# Patient Record
Sex: Male | Born: 1955 | Race: White | Hispanic: No | Marital: Married | Smoking: Former smoker
Health system: Southern US, Community
[De-identification: ages and names within clinical notes are randomized; demographics above are authoritative.]

## PROBLEM LIST (undated history)

## (undated) DIAGNOSIS — Z923 Personal history of irradiation: Secondary | ICD-10-CM

## (undated) DIAGNOSIS — T7840XA Allergy, unspecified, initial encounter: Secondary | ICD-10-CM

## (undated) DIAGNOSIS — E669 Obesity, unspecified: Secondary | ICD-10-CM

## (undated) DIAGNOSIS — C159 Malignant neoplasm of esophagus, unspecified: Secondary | ICD-10-CM

## (undated) DIAGNOSIS — I10 Essential (primary) hypertension: Secondary | ICD-10-CM

## (undated) DIAGNOSIS — K219 Gastro-esophageal reflux disease without esophagitis: Secondary | ICD-10-CM

## (undated) DIAGNOSIS — E119 Type 2 diabetes mellitus without complications: Secondary | ICD-10-CM

## (undated) DIAGNOSIS — E559 Vitamin D deficiency, unspecified: Secondary | ICD-10-CM

## (undated) DIAGNOSIS — E291 Testicular hypofunction: Secondary | ICD-10-CM

## (undated) DIAGNOSIS — E785 Hyperlipidemia, unspecified: Secondary | ICD-10-CM

## (undated) DIAGNOSIS — J90 Pleural effusion, not elsewhere classified: Secondary | ICD-10-CM

## (undated) HISTORY — DX: Vitamin D deficiency, unspecified: E55.9

## (undated) HISTORY — DX: Type 2 diabetes mellitus without complications: E11.9

## (undated) HISTORY — DX: Obesity, unspecified: E66.9

## (undated) HISTORY — DX: Essential (primary) hypertension: I10

## (undated) HISTORY — PX: WISDOM TOOTH EXTRACTION: SHX21

## (undated) HISTORY — DX: Allergy, unspecified, initial encounter: T78.40XA

## (undated) HISTORY — PX: COLONOSCOPY: SHX174

## (undated) HISTORY — DX: Testicular hypofunction: E29.1

## (undated) HISTORY — DX: Hyperlipidemia, unspecified: E78.5

---

## 2007-09-15 ENCOUNTER — Ambulatory Visit: Payer: Self-pay | Admitting: Gastroenterology

## 2007-09-26 ENCOUNTER — Ambulatory Visit: Payer: Self-pay | Admitting: Gastroenterology

## 2007-09-26 ENCOUNTER — Encounter: Payer: Self-pay | Admitting: Gastroenterology

## 2008-08-08 ENCOUNTER — Ambulatory Visit (HOSPITAL_COMMUNITY): Admission: RE | Admit: 2008-08-08 | Discharge: 2008-08-08 | Payer: Self-pay | Admitting: Internal Medicine

## 2012-07-27 ENCOUNTER — Encounter: Payer: Self-pay | Admitting: Gastroenterology

## 2013-05-04 ENCOUNTER — Encounter: Payer: Self-pay | Admitting: Gastroenterology

## 2013-07-31 ENCOUNTER — Telehealth: Payer: Self-pay | Admitting: Internal Medicine

## 2013-07-31 MED ORDER — METFORMIN HCL ER 500 MG PO TB24: ORAL_TABLET | ORAL | Status: DC

## 2013-07-31 NOTE — Telephone Encounter (Signed)
PT HAS RUN OUT OF METFORMIN, SCHD APPT FOR 08-11-13 WITH YOU. PHARM: [facility],[place].

## 2013-08-10 ENCOUNTER — Encounter: Payer: Self-pay | Admitting: Physician Assistant

## 2013-08-10 DIAGNOSIS — E559 Vitamin D deficiency, unspecified: Secondary | ICD-10-CM

## 2013-08-10 DIAGNOSIS — E119 Type 2 diabetes mellitus without complications: Secondary | ICD-10-CM | POA: Insufficient documentation

## 2013-08-10 DIAGNOSIS — E669 Obesity, unspecified: Secondary | ICD-10-CM

## 2013-08-10 DIAGNOSIS — E785 Hyperlipidemia, unspecified: Secondary | ICD-10-CM

## 2013-08-10 DIAGNOSIS — I10 Essential (primary) hypertension: Secondary | ICD-10-CM | POA: Insufficient documentation

## 2013-08-10 DIAGNOSIS — E291 Testicular hypofunction: Secondary | ICD-10-CM | POA: Insufficient documentation

## 2013-08-11 ENCOUNTER — Encounter: Payer: Self-pay | Admitting: Physician Assistant

## 2013-08-11 ENCOUNTER — Ambulatory Visit (INDEPENDENT_AMBULATORY_CARE_PROVIDER_SITE_OTHER): Payer: BC Managed Care – PPO | Admitting: Physician Assistant

## 2013-08-11 VITALS — BP 136/82 | HR 88 | Temp 98.2°F | Resp 16 | Ht 70.0 in | Wt 310.0 lb

## 2013-08-11 DIAGNOSIS — E119 Type 2 diabetes mellitus without complications: Secondary | ICD-10-CM

## 2013-08-11 DIAGNOSIS — E785 Hyperlipidemia, unspecified: Secondary | ICD-10-CM

## 2013-08-11 DIAGNOSIS — E559 Vitamin D deficiency, unspecified: Secondary | ICD-10-CM

## 2013-08-11 DIAGNOSIS — I10 Essential (primary) hypertension: Secondary | ICD-10-CM

## 2013-08-11 MED ORDER — DAPAGLIFLOZIN PROPANEDIOL 10 MG PO TABS: ORAL_TABLET | ORAL | Status: DC

## 2013-08-11 NOTE — Progress Notes (Signed)
HPI Patient presents for 3 month follow up with hypertension, hyperlipidemia, prediabetes and vitamin D. Patient's blood pressure has been controlled at home. Patient denies chest pain, shortness of breath, dizziness.  Patient's cholesterol is diet controlled. The cholesterol last visit was. The patient has been working on diet and exercise for prediabetes, and denies changes in vision, polys, and paresthesias.   Patient is on Phentermine and has been on it for 1.5 years. His weight is up 10 lbs from last visit in Jan. Patient is on Vitamin D supplement.  Current Medications:  Current Outpatient Prescriptions on File Prior to Visit  Medication Sig Dispense Refill  . aspirin 81 MG tablet Take 81 mg by mouth daily.      . cholecalciferol (VITAMIN D) 1000 UNITS tablet Take 10,000 Units by mouth daily.      Marland Kitchen losartan (COZAAR) 100 MG tablet Take 100 mg by mouth daily.      . metFORMIN (GLUCOPHAGE XR) 500 MG 24 hr tablet 2 pills twice daily with food  120 tablet  0  . phentermine (ADIPEX-P) 37.5 MG tablet Take 37.5 mg by mouth daily before breakfast.      . testosterone (ANDROGEL) 50 MG/5GM GEL Place 5 g onto the skin daily.       No current facility-administered medications on file prior to visit.   Medical History:  Past Medical History  Diagnosis Date  . Hypertension   . Hyperlipidemia   . Diabetes mellitus without complication   . Obesity   . Hypogonadism male   . Vitamin D deficiency    Allergies:  Allergies  Allergen Reactions  . Ace Inhibitors     cough    ROS Constitutional: Denies fever, chills, weight loss/gain, headaches, insomnia, fatigue, night sweats, and change in appetite. Eyes: Denies redness, blurred vision, diplopia, discharge, itchy, watery eyes.  ENT: Denies discharge, congestion, post nasal drip, sore throat, earache, dental pain, Tinnitus, Vertigo, Sinus pain, snoring.  Cardio: Denies chest pain, palpitations, irregular heartbeat,  dyspnea, diaphoresis,  orthopnea, PND, claudication, edema Respiratory: denies cough, dyspnea,pleurisy, hoarseness, wheezing.  Gastrointestinal: Denies dysphagia, heartburn,  water brash, pain, cramps, nausea, vomiting, bloating, diarrhea, constipation, hematemesis, melena, hematochezia,  hemorrhoids Genitourinary: Denies dysuria, frequency, urgency, nocturia, hesitancy, discharge, hematuria, flank pain Musculoskeletal: Denies arthralgia, myalgia, stiffness, Jt. Swelling, pain, limp, and strain/sprain. Skin: Denies pruritis, rash, hives, warts, acne, eczema, changing in skin lesion Neuro: Denies Weakness, tremor, incoordination, spasms, paresthesia, pain Psychiatric: Denies confusion, memory loss, sensory loss Endocrine: Denies change in weight, skin, hair change, nocturia, and paresthesia, Diabetic Polys, Denies visual blurring, hyper /hypo glycemic episodes.  Heme/Lymph: Denies Excessive bleeding, bruising, enlarged lymph nodes  Family history- Review and unchanged Social history- Review and unchanged Physical Exam: Filed Vitals:   08/11/13 0916  BP: 136/82  Pulse: 88  Temp: 98.2 F (36.8 C)  Resp: 16   Filed Weights   08/11/13 0916  Weight: 310 lb (140.615 kg)   General Appearance: Well nourished, in no apparent distress. Eyes: PERRLA, EOMs, conjunctiva no swelling or erythema, normal fundi and vessels. Sinuses: No Frontal/maxillary tenderness ENT/Mouth: Ext aud canals clear, with TMs without erythema, bulging.No erythema, swelling, or exudate on post pharynx.  Tonsils not swollen or erythematous. Hearing normal.  Neck: Supple, thyroid normal.  Respiratory: Respiratory effort normal, BS equal bilaterally without rales, rhonchi, wheezing or stridor.  Cardio: Heart sounds normal, regular rate and rhythm without murmurs, rubs or gallops. Peripheral pulses brisk and equal bilaterally, without edema.  Abdomen: Obese soft, with bowel  sounds. Non tender, no guarding, rebound, hernias, masses, or organomegaly.   Lymphatics: Non tender without lymphadenopathy.  Musculoskeletal: Full ROM all peripheral extremities, joint stability, 5/5 strength, and normal gait. Skin: Warm, dry without rashes, lesions, ecchymosis.  Neuro: Cranial nerves intact, reflexes equal bilaterally. Normal muscle tone, no cerebellar symptoms. Sensation intact.  Psych: Awake and oriented X 3, normal affect, Insight and Judgment appropriate.   Assessment and Plan:  Hypertension: Continue medication, monitor blood pressure at home. Continue DASH diet. Cholesterol: Continue diet and exercise.  Diabetes-Continue diet and exercise. We will try to add on Farxiga for further weight loss. We will get a BMP in one month at his work.  Vitamin D Def-and continue medications.   Joel Miller 9:39 AM

## 2013-08-11 NOTE — Patient Instructions (Signed)
   Bad carbs also include fruit juice, alcohol, and sweet tea. These are empty calories that do not signal to your brain that you are full.   Please remember the good carbs are still carbs which convert into sugar. So please measure them out no more than 1/2-1 cup of rice, oatmeal, pasta, and beans.  Veggies are however free foods! Pile them on.   I like lean protein at every meal such as chicken, [place], pork chops, cottage cheese, etc. Just do not fry these meats and please center your meal around vegetable, the meats should be a side dish.   No all fruit is created equal. Please see the list below, the fruit at the bottom is higher in sugars than the fruit at the top   We want weight loss that will last so you should lose 1-2 pounds a week.  THAT IS IT! Please pick THREE things a month to change. Once it is a habit check off the item. Then pick another three items off the list to become habits.  If you are already doing a habit on the list GREAT!  Cross that item off! o Don't drink your calories. Ie, alcohol, soda, fruit juice, and sweet tea.  o Drink more water. Drink a glass when you feel hungry or before each meal.  o Eat breakfast - Complex carb and protein (likeDannon light and fit yogurt, oatmeal, fruit, eggs, [place] bacon). o Measure your cereal.  Eat no more than one cup a day. (ie [place]) o Eat an apple a day. o Add a vegetable a day. o Try a new vegetable a month. o Use Pam! Stop using oil or butter to cook. o Don't finish your plate or use smaller plates. o Share your dessert. o Eat sugar free Jello for dessert or frozen grapes. o Don't eat 2-3 hours before bed. o Switch to whole wheat bread, pasta, and brown rice. o Make healthier choices when you eat out. No fries! o Pick baked chicken, NOT fried. o Don't forget to SLOW DOWN when you eat. It is not going anywhere.  o Take the stairs. o Park far away in the parking lot o [employer] (or weights) for 10 minutes while  watching TV. o Walk at work for 10 minutes during break. o Walk outside 1 time a week with your friend, kids, dog, or significant other. o Start a walking group at church. o Walk the mall as much as you can tolerate.  o Keep a food diary. o Weigh yourself daily. o Walk for 15 minutes 3 days per week. o Cook at home more often and eat out less.  If life happens and you go back to old habits, it is okay.  Just start over. You can do it!   If you experience chest pain, get short of breath, or tired during the exercise, please stop immediately and inform your doctor.  Remember exercise is great for your cardiovascular health and can help with weight loss but YOU CAN NOT OUT RUN YOUR FORK!

## 2013-08-28 ENCOUNTER — Other Ambulatory Visit: Payer: Self-pay | Admitting: Physician Assistant

## 2013-08-28 MED ORDER — PHENTERMINE HCL 37.5 MG PO TABS: 37.5000 mg | ORAL_TABLET | Freq: Every day | ORAL | Status: DC

## 2013-09-08 ENCOUNTER — Other Ambulatory Visit: Payer: Self-pay | Admitting: Physician Assistant

## 2013-09-08 MED ORDER — METFORMIN HCL ER 500 MG PO TB24: ORAL_TABLET | ORAL | Status: DC

## 2013-10-06 NOTE — Progress Notes (Signed)
Patient ID: Joel Miller, male   DOB: [birth year], 58 y.o.   MRN: [id number] Annual Screening Comprehensive Examination  This very nice 58 y.o.  MWM presents for complete physical.  Patient has been followed for HTN, Diabetes  Hyperlipidemia, and Vitamin D Deficiency.   HTN predates since  2010. Patient's BP has been controlled at home.Today's BP: 133/84 mmHg. Patient denies any cardiac symptoms as chest pain, palpitations, shortness of breath, dizziness or ankle swelling.   Patient's hyperlipidemia is controlled with diet and medications. Patient denies myalgias or other medication SE's. Last cholesterol last visit was 174, triglycerides 165, HDL 42 and LDL 99.     Patient has Diabetes with last A1c  6.8% in July 2014. Patient denies reactive hypoglycemic symptoms, visual blurring, diabetic polys, or paresthesias. He has been on phentermine with weight loss and he has lost 40 # over the last 3 years. Last visit he was put on Farxiga along with his Metformin.    Finally, patient has history of Vitamin D Deficiency of 63 in 2010 with last vitamin D 47 in July 2014. He also has Testosterone Deficiency with level of 118 in 2008 and has deferred treatment.    Medication List       aspirin 81 MG tablet  Take 81 mg by mouth daily.     Dapagliflozin Propanediol 10 MG Tabs  Commonly known as:  FARXIGA  One pill daily for diabetes     losartan 100 MG tablet  Commonly known as:  COZAAR  Take 100 mg by mouth daily.     metFORMIN 500 MG 24 hr tablet  Commonly known as:  GLUCOPHAGE XR  2 pills twice daily with food     phentermine 37.5 MG tablet  Commonly known as:  ADIPEX-P  1/2 to 1 tablet twice daily for weight loss     testosterone 50 MG/5GM Gel  Commonly known as:  ANDROGEL  Place 5 g onto the skin daily. Apply 2 pumps daily  Dispense 1pump per month     VITAMIN D PO  Take 10,000 Units by mouth daily.        Allergies  Allergen Reactions  . Ace Inhibitors     cough    Past  Medical History  Diagnosis Date  . Hypertension   . Hyperlipidemia   . Diabetes mellitus without complication   . Obesity   . Hypogonadism male   . Vitamin D deficiency     No past surgical history on file.  Family History  Problem Relation Age of Onset  . Hypertension Mother   . Alzheimer's disease Mother   . Cancer Father 15    throat    History   Social History  . Marital Status: Married    Spouse Name: N/A    Number of Children: N/A  . Years of Education: N/A   Occupational History  . Not on file.   Social History Main Topics  . Smoking status: Never Smoker   . Smokeless tobacco: Never Used  . Alcohol Use: 1.0 oz/week    2 drink(s) per week     Comment: social  . Drug Use: No  . Sexual Activity: Yes      ROS Constitutional: Denies fever, chills, weight loss/gain, headaches, insomnia, fatigue, night sweats, and change in appetite. Eyes: Denies redness, blurred vision, diplopia, discharge, itchy, watery eyes.  ENT: Denies discharge, congestion, post nasal drip, epistaxis, sore throat, earache, hearing loss, dental pain, Tinnitus, Vertigo, Sinus pain, snoring.  Cardio:  Denies chest pain, palpitations, irregular heartbeat, syncope, dyspnea, diaphoresis, orthopnea, PND, claudication, edema Respiratory: denies cough, dyspnea, DOE, pleurisy, hoarseness, laryngitis, wheezing.  Gastrointestinal: Denies dysphagia, heartburn, reflux, water brash, pain, cramps, nausea, vomiting, bloating, diarrhea, constipation, hematemesis, melena, hematochezia, jaundice, hemorrhoids Genitourinary: Denies dysuria, frequency, urgency, nocturia, hesitancy, discharge, hematuria, flank pain Musculoskeletal: Denies arthralgia, myalgia, stiffness, Jt. Swelling, pain, limp, and strain/sprain. Skin: Denies puritis, rash, hives, warts, acne, eczema, changing in skin lesion Neuro: No weakness, tremor, incoordination, spasms, paresthesia, pain Psychiatric: Denies confusion, memory loss, sensory  loss Endocrine: Denies change in weight, skin, hair change, nocturia, and paresthesia, diabetic polys, visual blurring, hyper / hypo glycemic episodes.  Heme/Lymph: No excessive bleeding, bruising, or elarged lymph nodes.  BP: 133/84  Pulse: 64  Temp: 98.1 F (36.7 C)  Resp: 18    Estimated body mass index is 43.62 kg/(m^2) as calculated from the following:   Height as of this encounter: 5\' 10"  (1.778 m).   Weight as of this encounter: 304 lb (137.893 kg).  Physical Exam  General Appearance: Well nourished, in no apparent distress. Eyes: PERRLA, EOMs, conjunctiva no swelling or erythema, normal fundi and vessels. Sinuses: No frontal/maxillary tenderness ENT/Mouth: EACs patent / TMs  nl. Nares clear without erythema, swelling, mucoid exudates. Oral hygiene is good. No erythema, swelling, or exudate. Tongue normal, non-obstructing. Tonsils not swollen or erythematous. Hearing normal.  Neck: Supple, thyroid normal. No bruits, nodes or JVD. Respiratory: Respiratory effort normal.  BS equal and clear bilateral without rales, rhonci, wheezing or stridor. Cardio: Heart sounds are normal with regular rate and rhythm and no murmurs, rubs or gallops. Peripheral pulses are normal and equal bilaterally without edema. No aortic or femoral bruits. Chest: symmetric with normal excursions and percussion.  Abdomen: Flat, soft, with bowl sounds. Nontender, no guarding, rebound, hernias, masses, or organomegaly.  Lymphatics: Non tender without lymphadenopathy.  Genitourinary: No hernias.Testes nl. DRE - prostate nl for age - smooth & firm w/o nodules. Musculoskeletal: Full ROM all peripheral extremities, joint stability, 5/5 strength, and normal gait. Skin: Warm and dry without rashes, lesions, cyanosis, clubbing or  ecchymosis.  Neuro: Cranial nerves intact, reflexes equal bilaterally. Normal muscle tone, no cerebellar symptoms. Sensation intact.  Pysch: Awake and oriented X 3, normal affect, insight  and judgment appropriate.   Assessment and Plan  1. Annual Screening Examination 2. Hypertension  3. Hyperlipidemia 4. T2 NIDDM 5. Vitamin D Deficiency 6. Morbid Obesity (BMI 44)  Continue prudent diet as discussed, weight control, BP monitoring, regular exercise, and medications as discussed.  Discussed med effects and SE's. Routine screening labs and tests as requested with regular follow-up as recommended. Discussed increasing his Phentermine 37.5 mg qam and 1/2 tab also at lunch time to carry him thru the evening meal whence he usually fails.

## 2013-10-06 NOTE — Patient Instructions (Signed)

## 2013-10-09 ENCOUNTER — Encounter: Payer: Self-pay | Admitting: Internal Medicine

## 2013-10-09 ENCOUNTER — Ambulatory Visit (INDEPENDENT_AMBULATORY_CARE_PROVIDER_SITE_OTHER): Payer: BC Managed Care – PPO | Admitting: Internal Medicine

## 2013-10-09 ENCOUNTER — Other Ambulatory Visit: Payer: Self-pay | Admitting: Emergency Medicine

## 2013-10-09 VITALS — BP 133/84 | HR 64 | Temp 98.1°F | Resp 18 | Ht 70.0 in | Wt 304.0 lb

## 2013-10-09 DIAGNOSIS — Z111 Encounter for screening for respiratory tuberculosis: Secondary | ICD-10-CM

## 2013-10-09 DIAGNOSIS — C61 Malignant neoplasm of prostate: Secondary | ICD-10-CM

## 2013-10-09 DIAGNOSIS — Z1159 Encounter for screening for other viral diseases: Secondary | ICD-10-CM

## 2013-10-09 DIAGNOSIS — E119 Type 2 diabetes mellitus without complications: Secondary | ICD-10-CM

## 2013-10-09 DIAGNOSIS — Z0000 Encounter for general adult medical examination without abnormal findings: Secondary | ICD-10-CM

## 2013-10-09 DIAGNOSIS — I10 Essential (primary) hypertension: Secondary | ICD-10-CM

## 2013-10-09 DIAGNOSIS — Z1211 Encounter for screening for malignant neoplasm of colon: Secondary | ICD-10-CM

## 2013-10-09 DIAGNOSIS — E291 Testicular hypofunction: Secondary | ICD-10-CM

## 2013-10-09 DIAGNOSIS — Z125 Encounter for screening for malignant neoplasm of prostate: Secondary | ICD-10-CM

## 2013-10-09 MED ORDER — TESTOSTERONE 50 MG/5GM (1%) TD GEL: 5.0000 g | Freq: Every day | TRANSDERMAL | Status: DC

## 2013-10-09 MED ORDER — DAPAGLIFLOZIN PROPANEDIOL 10 MG PO TABS
ORAL_TABLET | ORAL | Status: DC
Start: 2013-10-09 — End: 2014-04-23

## 2013-10-09 MED ORDER — PHENTERMINE HCL 37.5 MG PO TABS: ORAL_TABLET | ORAL | Status: DC

## 2013-10-10 LAB — URINALYSIS, MICROSCOPIC ONLY
BACTERIA UA: NONE SEEN
Casts: NONE SEEN
Crystals: NONE SEEN
Squamous Epithelial / LPF: NONE SEEN

## 2013-10-10 LAB — MICROALBUMIN / CREATININE URINE RATIO
CREATININE, URINE: 58 mg/dL
MICROALB UR: 0.5 mg/dL (ref 0.00–1.89)
Microalb Creat Ratio: 8.6 mg/g (ref 0.0–30.0)

## 2013-10-11 LAB — URINE CULTURE
Colony Count: NO GROWTH
ORGANISM ID, BACTERIA: NO GROWTH

## 2013-10-12 LAB — TB SKIN TEST
Induration: 0 mm
TB Skin Test: NEGATIVE

## 2013-10-16 ENCOUNTER — Telehealth: Payer: Self-pay

## 2013-10-16 ENCOUNTER — Other Ambulatory Visit: Payer: Self-pay | Admitting: Physician Assistant

## 2013-10-16 NOTE — Telephone Encounter (Signed)
Jeani Hawking from [facility] called and said that Androgel was called in for 2 pumps daily (2.5gm) but directions say use 5gm QD. Per McK, Androgel should be called in for 4 pumps QD. (5gm) spoke w/ Jeani Hawking to change directions.

## 2013-10-19 ENCOUNTER — Other Ambulatory Visit (INDEPENDENT_AMBULATORY_CARE_PROVIDER_SITE_OTHER): Payer: BC Managed Care – PPO | Admitting: *Deleted

## 2013-10-19 DIAGNOSIS — Z1211 Encounter for screening for malignant neoplasm of colon: Secondary | ICD-10-CM

## 2013-10-19 LAB — POC HEMOCCULT BLD/STL (HOME/3-CARD/SCREEN)
FECAL OCCULT BLD: NEGATIVE
FECAL OCCULT BLD: NEGATIVE
FECAL OCCULT BLD: NEGATIVE

## 2013-12-14 ENCOUNTER — Other Ambulatory Visit: Payer: Self-pay

## 2014-01-08 ENCOUNTER — Ambulatory Visit: Payer: BC Managed Care – PPO | Admitting: Internal Medicine

## 2014-01-15 ENCOUNTER — Ambulatory Visit (INDEPENDENT_AMBULATORY_CARE_PROVIDER_SITE_OTHER): Payer: BC Managed Care – PPO | Admitting: Internal Medicine

## 2014-01-15 ENCOUNTER — Encounter: Payer: Self-pay | Admitting: Internal Medicine

## 2014-01-15 VITALS — BP 130/82 | HR 80 | Temp 97.7°F | Resp 16 | Wt 290.2 lb

## 2014-01-15 DIAGNOSIS — E119 Type 2 diabetes mellitus without complications: Secondary | ICD-10-CM

## 2014-01-15 DIAGNOSIS — Z1211 Encounter for screening for malignant neoplasm of colon: Secondary | ICD-10-CM

## 2014-01-15 DIAGNOSIS — E785 Hyperlipidemia, unspecified: Secondary | ICD-10-CM

## 2014-01-15 DIAGNOSIS — Z79899 Other long term (current) drug therapy: Secondary | ICD-10-CM | POA: Insufficient documentation

## 2014-01-15 DIAGNOSIS — I10 Essential (primary) hypertension: Secondary | ICD-10-CM

## 2014-01-15 DIAGNOSIS — E559 Vitamin D deficiency, unspecified: Secondary | ICD-10-CM

## 2014-01-15 NOTE — Progress Notes (Signed)
Patient ID: Joel Miller, male   DOB: 1956-01-08, 58 y.o.   MRN: [id number]    This very nice 58 y.o. MWM presents for 3 month follow up with Hypertension, Hyperlipidemia, Pre-Diabetes and Vitamin D Deficiency.    HTN predates since 2010. BP has been controlled at home. Today's BP: 130/82 mmHg. Patient denies any cardiac type chest pain, palpitations, dyspnea/orthopnea/PND, dizziness, claudication, or dependent edema.   Hyperlipidemia is controlled with diet & meds. Recent labs done at work showed  Cholesterol was 160, Triglycerides were 154, HDL 39 and LDL 90. Patient denies myalgias or other med SE's.      Also, the patient has history of T2 NIDDM since 2009 with recent A1c of  6.1%.  Patient has lost 14+ lbs. over the last 3 months with assistance of Phentermine. Patient denies any symptoms of reactive hypoglycemia, diabetic polys, paresthesias or visual blurring.   The patient is on testosterone replacement therapy. Further, Patient has history of Vitamin D Deficiency with last vitamin D of 55. Patient supplements vitamin D without any suspected side-effects.    Medication List       aspirin 81 MG tablet  Take 81 mg by mouth daily.     Dapagliflozin Propanediol 10 MG Tabs  Commonly known as:  FARXIGA  One pill daily for diabetes     losartan 100 MG tablet  Commonly known as:  COZAAR  Take 100 mg by mouth daily.     metFORMIN 500 MG 24 hr tablet  Commonly known as:  GLUCOPHAGE XR  2 pills twice daily with food     phentermine 37.5 MG tablet  Commonly known as:  ADIPEX-P  1/2 to 1 tablet twice daily for weight loss     testosterone 50 MG/5GM (1%) Gel  Commonly known as:  ANDROGEL  Place 5 g onto the skin daily. Apply 2 pumps daily  Dispense 1pump per month     VITAMIN D PO  Take 10,000 Units by mouth daily.     zolpidem 12.5 MG CR tablet  Commonly known as:  AMBIEN CR        Allergies  Allergen Reactions  . Ace Inhibitors     cough   PMHx:   Past Medical  History  Diagnosis Date  . Hypertension   . Hyperlipidemia   . Diabetes mellitus without complication   . Obesity   . Hypogonadism male   . Vitamin D deficiency    FHx:    Reviewed / unchanged  SHx:    Reviewed / unchanged   Systems Review: Constitutional: Denies fever, chills, wt changes, headaches, insomnia, fatigue, night sweats, change in appetite. Eyes: Denies redness, blurred vision, diplopia, discharge, itchy, watery eyes.  ENT: Denies discharge, congestion, post nasal drip, epistaxis, sore throat, earache, hearing loss, dental pain, tinnitus, vertigo, sinus pain, snoring.  CV: Denies chest pain, palpitations, irregular heartbeat, syncope, dyspnea, diaphoresis, orthopnea, PND, claudication or edema. Respiratory: denies cough, dyspnea, DOE, pleurisy, hoarseness, laryngitis, wheezing.  Gastrointestinal: Denies dysphagia, odynophagia, heartburn, reflux, water brash, abdominal pain or cramps, nausea, vomiting, bloating, diarrhea, constipation, hematemesis, melena, hematochezia  or hemorrhoids. Genitourinary: Denies dysuria, frequency, urgency, nocturia, hesitancy, discharge, hematuria or flank pain. Musculoskeletal: Denies arthralgias, myalgias, stiffness, jt. swelling, pain, limping or strain/sprain.  Skin: Denies pruritus, rash, hives, warts, acne, eczema or change in skin lesion(s). Neuro: No weakness, tremor, incoordination, spasms, paresthesia or pain. Psychiatric: Denies confusion, memory loss or sensory loss. Endo: Denies change in weight, skin or hair change.  Heme/Lymph: No  excessive bleeding, bruising or enlarged lymph nodes.   Exam:  BP 130/82  Pulse 80  Temp(Src) 97.7 F (36.5 C)  Resp 16  Wt 290 lb 3.2 oz (131.634 kg)  Appears over nourished - ,morbidly obese and in no distress. Eyes: PERRLA, EOMs, conjunctiva no swelling or erythema. Sinuses: No frontal/maxillary tenderness ENT/Mouth: EAC's clear, TM's nl w/o erythema, bulging. Nares clear w/o erythema,  swelling, exudates. Oropharynx clear without erythema or exudates. Oral hygiene is good. Tongue normal, non obstructing. Hearing intact.  Neck: Supple. Thyroid nl. Car 2+/2+ without bruits, nodes or JVD. Chest: Respirations nl with BS clear & equal w/o rales, rhonchi, wheezing or stridor.  Cor: Heart sounds normal w/ regular rate and rhythm without sig. murmurs, gallops, clicks, or rubs. Peripheral pulses normal and equal  without edema.  Abdomen: Soft & bowel sounds normal. Non-tender w/o guarding, rebound, hernias, masses, or organomegaly.  Lymphatics: Unremarkable.  Musculoskeletal: Full ROM all peripheral extremities, joint stability, 5/5 strength, and normal gait.  Skin: Warm, dry without exposed rashes, lesions or ecchymosis apparent.  Neuro: Cranial nerves intact, reflexes equal bilaterally. Sensory-motor testing grossly intact. Tendon reflexes grossly intact.  Pysch: Alert & oriented x 3. Insight and judgement nl & appropriate. No ideations.  Assessment and Plan:  1. Hypertension - Continue monitor blood pressure at home. Continue diet/meds same.  2. Hyperlipidemia - Continue diet/meds, exercise,& lifestyle modifications. Continue monitor periodic cholesterol/liver & renal functions   3. T2 NIDDM - continue recommend prudent low glycemic diet, weight control, regular exercise, diabetic monitoring and periodic eye exams.  4. Vitamin D Deficiency - Continue supplementation.  5. Testosterone Deficiency - continue Androgel  Recommended regular exercise, BP monitoring, weight control, and discussed med and SE's. Recommended labs to assess and monitor clinical status. Further disposition pending results of labs.

## 2014-01-15 NOTE — Patient Instructions (Signed)

## 2014-02-28 ENCOUNTER — Other Ambulatory Visit: Payer: Self-pay

## 2014-02-28 MED ORDER — LOSARTAN POTASSIUM 100 MG PO TABS: 100.0000 mg | ORAL_TABLET | Freq: Every day | ORAL | Status: DC

## 2014-03-12 ENCOUNTER — Ambulatory Visit (INDEPENDENT_AMBULATORY_CARE_PROVIDER_SITE_OTHER): Payer: BC Managed Care – PPO | Admitting: Internal Medicine

## 2014-03-12 ENCOUNTER — Encounter: Payer: Self-pay | Admitting: Gastroenterology

## 2014-03-12 ENCOUNTER — Encounter: Payer: Self-pay | Admitting: Internal Medicine

## 2014-03-12 ENCOUNTER — Other Ambulatory Visit: Payer: Self-pay | Admitting: Internal Medicine

## 2014-03-12 VITALS — BP 126/76 | HR 68 | Temp 97.7°F | Resp 16 | Ht 70.0 in | Wt 286.8 lb

## 2014-03-12 DIAGNOSIS — K2100 Gastro-esophageal reflux disease with esophagitis, without bleeding: Secondary | ICD-10-CM

## 2014-03-12 DIAGNOSIS — R1310 Dysphagia, unspecified: Secondary | ICD-10-CM

## 2014-03-12 MED ORDER — PANTOPRAZOLE SODIUM 40 MG PO TBEC: 40.0000 mg | DELAYED_RELEASE_TABLET | Freq: Every day | ORAL | Status: DC

## 2014-03-12 MED ORDER — OXAZEPAM 15 MG PO CAPS: ORAL_CAPSULE | ORAL | Status: DC

## 2014-03-12 NOTE — Patient Instructions (Signed)
Dysphagia Swallowing problems (dysphagia) occur when solids and liquids seem to stick in your throat on the way down to your stomach, or the food takes longer to get to the stomach. Other symptoms include regurgitating food, noises coming from the throat, chest discomfort with swallowing, and a feeling of fullness or the feeling of something being stuck in your throat when swallowing. When blockage in your throat is complete it may be associated with drooling. CAUSES  Problems with swallowing may occur because of problems with the muscles. The food cannot be propelled in the usual manner into your stomach. You may have ulcers, scar tissue, or inflammation in the tube down which food travels from your mouth to your stomach (esophagus), which blocks food from passing normally into the stomach. Causes of inflammation include:  Acid reflux from your stomach into your esophagus.  Infection.  Radiation treatment for cancer.  Medicines taken without enough fluids to wash them down into your stomach. You may have nerve problems that prevent signals from being sent to the muscles of your esophagus to contract and move your food down to your stomach. Globus pharyngeus is a relatively common problem in which there is a sense of an obstruction or difficulty in swallowing, without any physical abnormalities of the swallowing passages being found. This problem usually improves over time with reassurance and testing to rule out other causes. DIAGNOSIS Dysphagia can be diagnosed and its cause can be determined by tests in which you swallow a white substance that helps illuminate the inside of your throat (contrast medium) while X-rays are taken. Sometimes a flexible telescope that is inserted down your throat (endoscopy) to look at your esophagus and stomach is used. TREATMENT   If the dysphagia is caused by acid reflux or infection, medicines may be used.  If the dysphagia is caused by problems with your  swallowing muscles, swallowing therapy may be used to help you strengthen your swallowing muscles.  If the dysphagia is caused by a blockage or mass, procedures to remove the blockage may be done. HOME CARE INSTRUCTIONS  Try to eat soft food that is easier to swallow and check your weight on a daily basis to be sure that it is not decreasing.  Be sure to drink liquids when sitting upright (not lying down). SEEK MEDICAL CARE IF:  You are losing weight because you are unable to swallow.  You are coughing when you drink liquids (aspiration).  You are coughing up partially digested food. SEEK IMMEDIATE MEDICAL CARE IF:  You are unable to swallow your own saliva .  You are having shortness of breath or a fever, or both.  You have a hoarse voice along with difficulty swallowing. MAKE SURE YOU:  Understand these instructions.  Will watch your condition.  Will get help right away if you are not doing well or get worse. Document Released: 08/21/2000 Document Revised: 04/26/2013 Document Reviewed: 02/10/2013 [facility] Patient Information 2015 [place], [place]. This information is not intended to replace advice given to you by your health care provider. Make sure you discuss any questions you have with your health care provider.   Esophageal Stricture The esophagus is the long, narrow tube which carries food and liquid from the mouth to the stomach. Sometimes a part of the esophagus becomes narrow and makes it difficult, painful, or even impossible to swallow. This is called an esophageal stricture.  CAUSES  Common causes of blockage or strictures of the esophagus are:  Exposure of the lower esophagus to  the acid from the stomach may cause narrowing.  Hiatal hernia in which a small part of the stomach bulges up through the diaphragm can cause a narrowing in the bottom of the esophagus.  Scleroderma is a tissue disorder that affects the esophagus and makes swallowing  difficult.  Achalasia is an absence of nerves in the lower esophagus and to the esophageal sphincter. This absence of nerves may be congenital (present since birth). This can cause irregular spasms which do not allow food and fluid through.  Strictures may develop from swallowing materials which damage the esophagus. Examples are acids or alkalis such as lye.  Schatzki's Ring is a narrow ring of non-cancerous tissue which narrows the lower esophagus. The cause of this is unknown.  Growths can block the esophagus. SYMPTOMS  Some of the problems are difficulty swallowing or pain with swallowing. DIAGNOSIS  Your caregiver often suspects this problem by taking a medical history. They will also do a physical exam. They may then take X-rays and/or perform an endoscopy. Endoscopy is an exam in which a tube like a small flexible telescope is used to look at your esophagus.  TREATMENT  One form of treatment is to dilate the narrow area. This means to stretch it.  When this is not successful, chest surgery may be required. This is a much more extensive form of treatment with a longer recovery time. Both of the above treatments make the passage of food and water into the stomach easier. They also make it easier for stomach contents to bubble back into the esophagus. Special medications may be used following the procedure to help prevent further narrowing. Medications may be used to lower the amount of acid in the stomach juice.  SEEK IMMEDIATE MEDICAL CARE IF:   Your swallowing is becoming more painful, difficult, or you are unable to swallow.  You vomit up blood.  You develop black tarry stools.  You develop chills.  You have a fever.  You develop chest or abdominal pain.  You develop shortness of breath, feel lightheaded, or faint. Follow up with medical care as your caregiver suggests. Document Released: 05/04/2006 Document Revised: 11/16/2011 Document Reviewed: 06/10/2006 [facility]  Patient Information 2015 [place], [place]. This information is not intended to replace advice given to you by your health care provider. Make sure you discuss any questions you have with your health care provider. Dysphagia Level 2 Diet, Mechanically Altered The dysphagia level 2 diet includes foods that are blended, chopped, ground, or mashed so they are easier to chew and swallow. The foods are soft, moist, and can be chopped into -inch chunks (such as pancakes, pasta, and bananas). In order to be on this diet, you must be able to chew. This diet helps you transition between the pureed textures of the dysphagia level 1 diet to more solid textures. This diet is helpful for people with mild to moderate swallowing difficulties. It reduces the risk of food getting caught in the windpipe, trachea, or lungs.  You may need help or supervision during meals while following this diet so that you eat safely. You will be on this diet until your health care provider advances the texture of your diet.  WHAT DO I NEED TO KNOW ABOUT THIS DIET? Foods  You may eat foods that are soft and moist.  You may need to use a blender, whisk, or masher to soften some of your foods.  You can moisten foods with gravies, sauces, vegetable or fruit juice, milk, half and  half, or water when blending, mashing, or grinding your foods to the right consistency.  If you were on the dysphagia level 1 diet, you may still eat any of the foods included in that diet.  Avoid foods that are dry, hard, sticky, chewy, coarse, and crunchy. Also avoid large cuts of food.  Take small bites. Each bite should contain  inch or less of food. Liquids  Avoid liquids with seeds and chunks.  Thicken liquids, if instructed by your health care provider. Your health care provider will tell you the consistency to which you should thicken your liquids for safe swallowing. To thicken a liquid, use a commercial thickener or a thickening food (such as rice  cereal or potato flakes). Ask your health care provider for specific recommendations on thickeners. See your dietitian or health care provider regularly for help with your dietary changes. WHAT FOODS CAN I EAT? Grains Store-bought soft breads that do not have nuts or seeds. Pancakes, sweet rolls, [place] pastries, and [place] toast that have been moistened with syrup or sauce to form a slurry when blended. Well-cooked pasta, noodles, and bread dressing. Well-cooked noodles and pasta in sauce. Moist macaroni and cheese. Soft dumplings or spaetzle with gravy or butter. Cooked cereals (including oatmeal). Low-texture dry cereals, such as rice puff, corn, or wheat-flake cereals, with milk (if thin liquids are not allowed, make sure all of the milk is absorbed by the cereal before eating it). Vegetables Very soft, well-cooked vegetables in pieces less than  inch in size. Cooked potatoes that are moist, not crispy, and with sauce. Fruits Canned or cooked fruits that are soft or moist and do not have skin or seeds. Fresh, soft bananas. Fruit juices with a small amount of pulp (if thin liquids are allowed). Gelatin or plain gelatin with canned fruit, except pineapple. Meat and Other Protein Sources Tender, moist meats, poultry, or fish cooked with gravy or sauce and cubed to -inch bites or smaller. Ground meat. Moist meatball or meatloaf. Fish without bones. Moist casseroles without rice. Tuna, egg, or meat salad without chunks or hard-to-chew vegetables, such as celery and onions. Smooth quiche without large chunks. Scrambled, poached, or soft-cooked eggs with butter, margarine, sauce, or gravy. Tofu. Well-cooked, moistened and mashed beans, peas, baked beans, and other legumes. Casseroles without rice (such as tuna noodle casserole or soft moist meat lasagna). Dairy Cream cheese. Yogurt. Cottage cheese. Ask your health care provider if milk is allowed. Sweets/Desserts Pudding. Custard. Soft fruit pies with  crust on the bottom only. Crisps and cobblers without seeds or nuts and with soft crusts. Soft, moist cakes. Icing. Pre-gelled cookies. Soft, moist cookies dunked in milk, coffee, or another liquid. Jelly. Soft, smooth chocolate bars that are easily chewed. Jams and preserves without seeds. Ask your health care provider whether you can have frozen desserts. Fats and Oils Butter. Margarine. Cream for cereal, depending on liquid consistency allowed. Gravy. Cream sauces. Mayonnaise. Salad dressings. Cream cheese. Cheese spreads, plain or with soft fruits or vegetables added. Sour cream. Sour cream dips with soft fruits or vegetables added. Whipped toppings. Other Sauces and salsas that have soft chunks that are about  inch or smaller. The items listed above may not be a complete list of recommended foods or beverages. Contact your dietitian for more options. WHAT FOODS ARE NOT RECOMMENDED? Grains All breads not listed in the recommended list. Breads that are hard or have nuts or seeds. Coarse cereals. Cereals that have nuts, seeds, dried fruits, or coconut. Rice.  Corn. Vegetables Whole, raw, frozen, or dried vegetables. Tough, fibrous, chewy, or stringy cooked vegetables, such as celery, peas, broccoli, cabbage, Brussels sprouts, and asparagus. Potato skins. Potato and other vegetable chips. Fried or French-fried potatoes. Cooked corn and peas. Fruits Whole raw, frozen, or dried fruits, including coconut. Pineapple. Fruits with seeds. Meat and Other Protein Sources Dry, tough meats, such as bacon, sausage, and hot dogs. Cheese slices and cubes. Peanut butter. Hard boiled or fried eggs. Nuts. Seeds. Pizza. Sandwiches. Dry casseroles or casseroles with rice or large chunks. Dairy Yogurt with nuts, seeds, or large chunks. Sweets/Desserts Coarse, hard, chewy, or sticky desserts. Any dessert with nuts, seeds, coconut, pineapple, or dried fruit. Ask your health care provider whether you can have frozen  desserts. Fats and Oils Avoid fats with chunky, large textures, such as those with nuts or fruits. Other Soups and casseroles with large chunks. The items listed above may not be a complete list of foods and beverages to avoid. Contact your dietitian for more information. Document Released: 08/24/2005 Document Revised: 04/26/2013 Document Reviewed: 08/07/2013 [facility] Patient Information 2015 [place], [place]. This information is not intended to replace advice given to you by your health care provider. Make sure you discuss any questions you have with your health care provider.

## 2014-03-12 NOTE — Progress Notes (Signed)
   Subjective:    Patient ID: Joel Miller, male    DOB: 20-Mar-1956, 58 y.o.   MRN: [id number]  Gastrophageal Reflux He complains of belching, choking, dysphagia and globus sensation. He reports no abdominal pain, no chest pain, no coughing, no hoarse voice, no nausea, no sore throat, no stridor, no water brash or no wheezing. Patient c/o "lump" in chest ant points to the lower sternal area.  Says has to drink many glasses of water with each mael to 'wash down' his food.. The current episode started more than 1 month ago. The problem occurs frequently. The problem has been waxing and waning. The symptoms are aggravated by certain foods. Pertinent negatives include no fatigue.    Review of Systems  Constitutional: Negative.  Negative for fever, chills, activity change, appetite change and fatigue.  HENT: Positive for trouble swallowing. Negative for hoarse voice, sinus pressure, sore throat and voice change.   Eyes: Negative.   Respiratory: Positive for choking. Negative for cough and wheezing.   Cardiovascular: Negative.  Negative for chest pain.  Gastrointestinal: Positive for dysphagia and vomiting. Negative for nausea, abdominal pain and constipation.  Neurological: Negative.  Negative for dizziness, tremors, seizures, syncope, speech difficulty, weakness, light-headedness, numbness and headaches.  Hematological: Negative.   Psychiatric/Behavioral: Negative.    BP 126/76  P 68  T 97.7 F   Resp 16  Ht 5\' 10"    Wt 286 lb 12.8 oz   BMI 41.15 kg/m2  Objective:   Physical Exam  Constitutional: He is oriented to person, place, and time. He appears well-developed. No distress.  HENT:  Head: Atraumatic.  Mouth/Throat: No oropharyngeal exudate.  Eyes: Pupils are equal, round, and reactive to light. Right eye exhibits no discharge. Left eye exhibits no discharge. No scleral icterus.  Neck: Normal range of motion. Neck supple. No JVD present. No tracheal deviation present. No thyromegaly  present.  Cardiovascular: Normal rate, regular rhythm, normal heart sounds and intact distal pulses.   No murmur heard. Pulmonary/Chest: Breath sounds normal. He is in respiratory distress. He has no wheezes. He has no rales. He exhibits no tenderness.  Abdominal: Soft. Bowel sounds are normal. He exhibits no mass. There is no tenderness. There is no guarding.  Musculoskeletal: He exhibits no edema and no tenderness.  Lymphadenopathy:    He has no cervical adenopathy.  Neurological: He is alert and oriented to person, place, and time. Coordination normal.  Skin: Skin is warm and dry. No rash noted. He is not diaphoretic. No pallor.  Psychiatric: He has a normal mood and affect.   Assessment & Plan:   1. Reflux esophagitis (with stricture suspected) - Protonix 40 mg qd pending EGD  2. Dysphagia - Ambulatory referral to GI - Needs EGD and overdue Colon

## 2014-03-21 ENCOUNTER — Other Ambulatory Visit: Payer: Self-pay

## 2014-03-21 MED ORDER — METFORMIN HCL ER 500 MG PO TB24: ORAL_TABLET | ORAL | Status: DC

## 2014-04-09 ENCOUNTER — Ambulatory Visit: Payer: BC Managed Care – PPO | Admitting: Internal Medicine

## 2014-04-12 ENCOUNTER — Other Ambulatory Visit: Payer: Self-pay | Admitting: *Deleted

## 2014-04-12 MED ORDER — PANTOPRAZOLE SODIUM 40 MG PO TBEC: 40.0000 mg | DELAYED_RELEASE_TABLET | Freq: Every day | ORAL | Status: DC

## 2014-04-16 ENCOUNTER — Ambulatory Visit: Payer: BC Managed Care – PPO | Admitting: Internal Medicine

## 2014-04-22 ENCOUNTER — Encounter: Payer: Self-pay | Admitting: Internal Medicine

## 2014-04-22 DIAGNOSIS — K2100 Gastro-esophageal reflux disease with esophagitis, without bleeding: Secondary | ICD-10-CM | POA: Insufficient documentation

## 2014-04-22 NOTE — Progress Notes (Signed)
Patient ID: Joel Miller, male   DOB: [birth year], 58 y.o.   MRN: [id number]   This very nice 58 y.o.MWM presents for 3 month follow up with Hypertension, Hyperlipidemia, Pre-Diabetes and Vitamin D Deficiency.    Patient is treated for HTN (2010) & BP has been controlled at home. Today's BP: 106/68 mmHg. Patient denies any cardiac type chest pain, palpitations, dyspnea/orthopnea/PND, dizziness, claudication, or dependent edema.  Hyperlipidemia is controlled with diet & meds. Patient denies myalgias or other med SE's. Last Lipids were    Also, the patient has history of Morbid Obesity (BMI 38.4) and consequent T2_NIDDM since 2009. Patient continues to lose weight and has lost 20# over the last 3 months (Wt 345# in 09/2010 to now 268#)  and patient denies any symptoms of reactive hypoglycemia, diabetic polys, paresthesias or visual blurring.  Last A1c was  6.5%. He doe attribute some of his weight loss to heartburn and reflux Sx's aggravated by eating despite being on Protonix.    Further, Patient has history of Vitamin D Deficiency (33 in 2010) 47and patient supplements vitamin D without any suspected side-effects. Last vitamin D was  47.   Medication List   aspirin 81 MG tablet  Take 81 mg by mouth daily.     FREESTYLE LITE test strip  Generic drug:  glucose blood  CHECK BLOOD SUGAR ONCE DAILY     losartan 100 MG tablet  Commonly known as:  COZAAR  Take 1 tablet (100 mg total) by mouth daily.     metFORMIN 500 MG 24 hr tablet  Commonly known as:  GLUCOPHAGE XR  2 pills twice daily with food     oxazepam 15 MG capsule  Commonly known as:  SERAX  Take 1 to 2 caps 1 hour before bedtime as needed for sleep     pantoprazole 40 MG tablet  Commonly known as:  PROTONIX  Take 1 tablet 2 x daily for Acid Indigestion & Reflux     testosterone 50 MG/5GM (1%) Gel  Commonly known as:  ANDROGEL  Place 5 g onto the skin daily. Apply 2 pumps daily  Dispense 1pump per month     VITAMIN D PO  Take  10,000 Units by mouth daily.     zolpidem 12.5 MG CR tablet  Commonly known as:  AMBIEN CR     Allergies  Allergen Reactions  . Ace Inhibitors     cough   PMHx:   Past Medical History  Diagnosis Date  . Hypertension   . Hyperlipidemia   . Diabetes mellitus without complication   . Obesity   . Hypogonadism male   . Vitamin D deficiency    FHx:    Reviewed / unchanged SHx:    Reviewed / unchanged  Systems Review:  Constitutional: Denies fever, chills, wt changes, headaches, insomnia, fatigue, night sweats, change in appetite. Eyes: Denies redness, blurred vision, diplopia, discharge, itchy, watery eyes.  ENT: Denies discharge, congestion, post nasal drip, epistaxis, sore throat, earache, hearing loss, dental pain, tinnitus, vertigo, sinus pain, snoring.  CV: Denies chest pain, palpitations, irregular heartbeat, syncope, dyspnea, diaphoresis, orthopnea, PND, claudication or edema. Respiratory: denies cough, dyspnea, DOE, pleurisy, hoarseness, laryngitis, wheezing.  Gastrointestinal: Denies dysphagia, odynophagia, heartburn, reflux, water brash, abdominal pain or cramps, nausea, vomiting, bloating, diarrhea, constipation, hematemesis, melena, hematochezia  or hemorrhoids. Genitourinary: Denies dysuria, frequency, urgency, nocturia, hesitancy, discharge, hematuria or flank pain. Musculoskeletal: Denies arthralgias, myalgias, stiffness, jt. swelling, pain, limping or strain/sprain.  Skin: Denies pruritus, rash, hives,  warts, acne, eczema or change in skin lesion(s). Neuro: No weakness, tremor, incoordination, spasms, paresthesia or pain. Psychiatric: Denies confusion, memory loss or sensory loss. Endo: Denies change in weight, skin or hair change.  Heme/Lymph: No excessive bleeding, bruising or enlarged lymph nodes.  Exam:  BP 106/68  P 76  T 97.5 F   R 16  Ht 5\' 10"    Wt 267 lb 9.6 oz   BMI 38.40 kg/m2  Appears well nourished and in no distress. Eyes: PERRLA, EOMs,  conjunctiva no swelling or erythema. Sinuses: No frontal/maxillary tenderness ENT/Mouth: EAC's clear, TM's nl w/o erythema, bulging. Nares clear w/o erythema, swelling, exudates. Oropharynx clear without erythema or exudates. Oral hygiene is good. Tongue normal, non obstructing. Hearing intact.  Neck: Supple. Thyroid nl. Car 2+/2+ without bruits, nodes or JVD. Chest: Respirations nl with BS clear & equal w/o rales, rhonchi, wheezing or stridor.  Cor: Heart sounds normal w/ regular rate and rhythm without sig. murmurs, gallops, clicks, or rubs. Peripheral pulses normal and equal  without edema.  Abdomen: Soft & bowel sounds normal. Non-tender w/o guarding, rebound, hernias, masses, or organomegaly.  Lymphatics: Unremarkable.  Musculoskeletal: Full ROM all peripheral extremities, joint stability, 5/5 strength, and normal gait.  Skin: Warm, dry without exposed rashes, lesions or ecchymosis apparent.  Neuro: Cranial nerves intact, reflexes equal bilaterally. Sensory-motor testing grossly intact. Tendon reflexes grossly intact.  Pysch: Alert & oriented x 3. Insight and judgement nl & appropriate. No ideations.  Assessment and Plan:  1. Hypertension - Continue monitor blood pressure at home. Continue diet/meds same.  2. Hyperlipidemia - Continue diet/meds, exercise,& lifestyle modifications. Continue monitor periodic cholesterol/liver & renal functions   3. T2_NIDDM -    4. Vitamin D Deficiency - Continue supplementation.  Recommended regular exercise, BP monitoring, weight control, and discussed med and SE's. Recommended labs to assess and monitor clinical status. Further disposition pending results of labs.

## 2014-04-22 NOTE — Patient Instructions (Signed)

## 2014-04-23 ENCOUNTER — Ambulatory Visit (INDEPENDENT_AMBULATORY_CARE_PROVIDER_SITE_OTHER): Payer: BC Managed Care – PPO | Admitting: Internal Medicine

## 2014-04-23 ENCOUNTER — Encounter: Payer: Self-pay | Admitting: Internal Medicine

## 2014-04-23 VITALS — BP 106/68 | HR 76 | Temp 97.5°F | Resp 16 | Ht 70.0 in | Wt 267.6 lb

## 2014-04-23 DIAGNOSIS — E785 Hyperlipidemia, unspecified: Secondary | ICD-10-CM

## 2014-04-23 DIAGNOSIS — E559 Vitamin D deficiency, unspecified: Secondary | ICD-10-CM

## 2014-04-23 DIAGNOSIS — Z79899 Other long term (current) drug therapy: Secondary | ICD-10-CM

## 2014-04-23 DIAGNOSIS — E119 Type 2 diabetes mellitus without complications: Secondary | ICD-10-CM

## 2014-04-23 DIAGNOSIS — K2100 Gastro-esophageal reflux disease with esophagitis, without bleeding: Secondary | ICD-10-CM

## 2014-04-23 DIAGNOSIS — I10 Essential (primary) hypertension: Secondary | ICD-10-CM

## 2014-04-23 MED ORDER — PANTOPRAZOLE SODIUM 40 MG PO TBEC: DELAYED_RELEASE_TABLET | ORAL | Status: DC

## 2014-05-17 ENCOUNTER — Ambulatory Visit (INDEPENDENT_AMBULATORY_CARE_PROVIDER_SITE_OTHER): Payer: BC Managed Care – PPO | Admitting: Gastroenterology

## 2014-05-17 ENCOUNTER — Encounter: Payer: Self-pay | Admitting: Gastroenterology

## 2014-05-17 VITALS — BP 140/80 | HR 64 | Ht 69.5 in | Wt 252.4 lb

## 2014-05-17 DIAGNOSIS — Z860100 Personal history of colon polyps, unspecified: Secondary | ICD-10-CM | POA: Insufficient documentation

## 2014-05-17 DIAGNOSIS — R1310 Dysphagia, unspecified: Secondary | ICD-10-CM

## 2014-05-17 DIAGNOSIS — E119 Type 2 diabetes mellitus without complications: Secondary | ICD-10-CM

## 2014-05-17 DIAGNOSIS — C61 Malignant neoplasm of prostate: Secondary | ICD-10-CM

## 2014-05-17 MED ORDER — NA SULFATE-K SULFATE-MG SULF 17.5-3.13-1.6 GM/177ML PO SOLN: 1.0000 | Freq: Once | ORAL | Status: DC

## 2014-05-17 NOTE — Patient Instructions (Addendum)
You have been scheduled for an endoscopy. Please follow written instructions given to you at your visit today. If you use inhalers (even only as needed), please bring them with you on the day of your procedure. Your physician has requested that you go to www.[email or web address] and enter the access code given to you at your visit today. This web site gives a general overview about your procedure. However, you should still follow specific instructions given to you by our office regarding your preparation for the procedure.  You have been scheduled for a colonoscopy. Please follow written instructions given to you at your visit today.  Please pick up your prep kit at the pharmacy within the next 1-3 days. If you use inhalers (even only as needed), please bring them with you on the day of your procedure. Your physician has requested that you go to www.[email or web address] and enter the access code given to you at your visit today. This web site gives a general overview about your procedure. However, you should still follow specific instructions given to you by our office regarding your preparation for the procedure.  Suprep sample kit given to patient

## 2014-05-17 NOTE — Assessment & Plan Note (Addendum)
One year history of worsening dysphagia to solids with recent weight loss.  Patient very likely has a stricture, be it benign or malignant.    Recommendations #1 upper endoscopy with dilation as indicated  Risks, alternatives, and complications of the procedure, including bleeding, perforation, and possible need for surgery, were explained to the patient.  Patient's questions were answered.

## 2014-05-17 NOTE — Assessment & Plan Note (Signed)
Plan followup colonoscopy 

## 2014-05-17 NOTE — Addendum Note (Signed)
Addended by: Oda Kilts on: 05/17/2014 02:20 PM   Modules accepted: Orders

## 2014-05-17 NOTE — Progress Notes (Signed)
_                                                                                                                History of Present Illness:  Joel Miller is a 59 year old white male with history of colon polyps, diabetes, obesity, referred for evaluation of dysphagia . Over the past year he's had increasing dysphagia to solids.  At this point he is only taking full liquids.  He's lost about 35 pounds in the last several months.  He has immediate postprandial discomfort in his upper abdomen lower chest.  He rarely has pyrosis.  An adenomatous polyp was removed in 2009 the   Past Medical History  Diagnosis Date  . Hypertension   . Hyperlipidemia   . Diabetes mellitus without complication   . Obesity   . Hypogonadism male   . Vitamin D deficiency    History reviewed. No pertinent past surgical history. family history includes Alzheimer's disease in his mother; Cancer (age of onset: 35) in his father; Hypertension in his mother. Current Outpatient Prescriptions  Medication Sig Dispense Refill  . aspirin 81 MG tablet Take 81 mg by mouth daily.      . Cholecalciferol (VITAMIN D PO) Take 10,000 Units by mouth daily.      Marland Kitchen FREESTYLE LITE test strip CHECK BLOOD SUGAR ONCE DAILY  100 each  11  . losartan (COZAAR) 100 MG tablet Take 1 tablet (100 mg total) by mouth daily.  90 tablet  0  . oxazepam (SERAX) 15 MG capsule Take 1 to 2 caps 1 hour before bedtime as needed for sleep Hasn't started taking yet. Will stop antacids before taking.      . pantoprazole (PROTONIX) 40 MG tablet Take 1 tablet 2 x daily for Acid Indigestion & Reflux  60 tablet  99  . testosterone (ANDROGEL) 50 MG/5GM GEL Place 5 g onto the skin daily. Apply 2 pumps daily  Dispense 1pump per month  1 Package  5  . zolpidem (AMBIEN CR) 12.5 MG CR tablet       . metFORMIN (GLUCOPHAGE XR) 500 MG 24 hr tablet 2 pills twice daily with food  120 tablet  2   No current facility-administered medications for this visit.    Allergies as of 05/17/2014 - Review Complete 05/17/2014  Allergen Reaction Noted  . Ace inhibitors  08/10/2013    reports that he has never smoked. He has never used smokeless tobacco. He reports that he drinks about one ounce of alcohol per week. He reports that he does not use illicit drugs.   Review of Systems: Pertinent positive and negative review of systems were noted in the above HPI section. All other review of systems were otherwise negative.  Vital signs were reviewed in today's medical record Physical Exam: General: Well developed , well nourished, no acute distress Skin: anicteric Head: Normocephalic and atraumatic Eyes:  sclerae anicteric, EOMI Ears: Normal auditory acuity Mouth: No deformity or lesions Neck: Supple, no masses or thyromegaly Lungs: Clear  throughout to auscultation Heart: Regular rate and rhythm; no murmurs, rubs or bruits Abdomen: Soft, non tender and non distended. No masses, hepatosplenomegaly or hernias noted. Normal Bowel sounds Rectal:deferred Musculoskeletal: Symmetrical with no gross deformities  Skin: No lesions on visible extremities Pulses:  Normal pulses noted Extremities: No clubbing, cyanosis, edema or deformities noted Neurological: Alert oriented x 4, grossly nonfocal Cervical Nodes:  No significant cervical adenopathy Inguinal Nodes: No significant inguinal adenopathy Psychological:  Alert and cooperative. Normal mood and affect  See Assessment and Plan under Problem List

## 2014-05-18 ENCOUNTER — Ambulatory Visit (AMBULATORY_SURGERY_CENTER): Payer: BC Managed Care – PPO | Admitting: Gastroenterology

## 2014-05-18 ENCOUNTER — Encounter: Payer: Self-pay | Admitting: Gastroenterology

## 2014-05-18 ENCOUNTER — Other Ambulatory Visit: Payer: Self-pay

## 2014-05-18 VITALS — BP 149/90 | HR 58 | Temp 97.0°F | Resp 18 | Ht 69.5 in | Wt 252.0 lb

## 2014-05-18 DIAGNOSIS — K228 Other specified diseases of esophagus: Secondary | ICD-10-CM

## 2014-05-18 DIAGNOSIS — K2289 Other specified disease of esophagus: Secondary | ICD-10-CM

## 2014-05-18 DIAGNOSIS — R1310 Dysphagia, unspecified: Secondary | ICD-10-CM

## 2014-05-18 DIAGNOSIS — C159 Malignant neoplasm of esophagus, unspecified: Secondary | ICD-10-CM

## 2014-05-18 DIAGNOSIS — D490 Neoplasm of unspecified behavior of digestive system: Secondary | ICD-10-CM

## 2014-05-18 HISTORY — DX: Malignant neoplasm of esophagus, unspecified: C15.9

## 2014-05-18 HISTORY — PX: OTHER SURGICAL HISTORY: SHX169

## 2014-05-18 MED ORDER — SODIUM CHLORIDE 0.9 % IV SOLN: 500.0000 mL | INTRAVENOUS | Status: DC

## 2014-05-18 NOTE — Op Note (Signed)
[place]  [address] [place], [id number]   ENDOSCOPY PROCEDURE REPORT  PATIENT: Joel Miller, Joel Miller  MR#: [id number] BIRTHDATE: 12-Dec-1955 , 58  yrs. old GENDER: Male ENDOSCOPIST: Inda Castle, MD REFERRED BY: PROCEDURE DATE:  05/18/2014 PROCEDURE:  EGD w/ biopsy ASA CLASS:     Class II INDICATIONS:  Dysphagia. MEDICATIONS: MAC sedation, administered by CRNA, propofol (Diprivan) 200mg  IV, and Simethicone 0.6cc PO TOPICAL ANESTHETIC:  DESCRIPTION OF PROCEDURE: After the risks benefits and alternatives of the procedure were thoroughly explained, informed consent was obtained.  The LB [id number] endoscope was introduced through the mouth and advanced to the third portion of the duodenum. Without limitations.  The instrument was slowly withdrawn as the mucosa was fully examined.      Starting at the gastric cardia and extending into the lower one third of the esophagus there was a circumferential, exophytic, friable tumor causing a long stricture.  The 9 mm gastroscope easily traversed this area.  Tumor was seen in clusters throughout the esophagus up to approximately 25 cm from the incisors. Multiple biopsies were taken.   The remainder of the upper endoscopy exam was otherwise normal.  Retroflexed views revealed no abnormalities.     The scope was then withdrawn from the patient and the procedure completed.  COMPLICATIONS: There were no complications. ENDOSCOPIC IMPRESSION: 1.   Starting at the gastric cardia and extending into the lower one third of the esophagus there was a circumferential, exophytic, friable tumor causing a long stricture.  The 9 mm gastroscope easily traversed this area.  Tumor was seen in clusters throughout the esophagus up to approximately 25 cm from the incisors. Multiple biopsies were taken. 2.   The remainder of the upper endoscopy exam was otherwise normal  RECOMMENDATIONS: 1.  Await biopsy results 2.  CT  Scan  REPEAT EXAM:  eSigned:  Inda Castle, MD 05/18/2014 10:38 AM   CC: Illene Regulus, MD  PATIENT NAME:  Joel Miller, Joel Miller MR#: [id number]

## 2014-05-18 NOTE — Patient Instructions (Signed)

## 2014-05-18 NOTE — Progress Notes (Signed)
Called to room to assist during endoscopic procedure.  Patient ID and intended procedure confirmed with present staff. Received instructions for my participation in the procedure from the performing physician.  

## 2014-05-18 NOTE — Progress Notes (Signed)
Procedure ends, to recovery, report given and VSS. 

## 2014-05-21 ENCOUNTER — Telehealth: Payer: Self-pay

## 2014-05-21 NOTE — Telephone Encounter (Signed)
Left message on answering machine. 

## 2014-05-22 ENCOUNTER — Ambulatory Visit (INDEPENDENT_AMBULATORY_CARE_PROVIDER_SITE_OTHER)
Admission: RE | Admit: 2014-05-22 | Discharge: 2014-05-22 | Disposition: A | Discharge status: Home / Self Care | Payer: BC Managed Care – PPO | Source: Ambulatory Visit | Attending: Gastroenterology | Admitting: Gastroenterology

## 2014-05-22 ENCOUNTER — Telehealth: Payer: Self-pay | Admitting: Oncology

## 2014-05-22 DIAGNOSIS — K2289 Other specified disease of esophagus: Secondary | ICD-10-CM

## 2014-05-22 DIAGNOSIS — K229 Disease of esophagus, unspecified: Secondary | ICD-10-CM

## 2014-05-22 DIAGNOSIS — K228 Other specified diseases of esophagus: Secondary | ICD-10-CM

## 2014-05-22 MED ORDER — IOHEXOL 300 MG/ML  SOLN
100.0000 mL | Freq: Once | INTRAMUSCULAR | Status: AC | PRN
  Administered 2014-05-22: 100 mL via INTRAVENOUS

## 2014-05-22 NOTE — Telephone Encounter (Signed)
PATIENT SCHEDULED FOR RAD ONC 09/21 @ 7:30 NURSE EVAL , 8 MD APPT.

## 2014-05-23 ENCOUNTER — Telehealth: Payer: Self-pay | Admitting: Gastroenterology

## 2014-05-23 NOTE — Telephone Encounter (Signed)
See results. Patient advised of results and the plan. He is aware of the appointment with radiation therapy 05/28/14.

## 2014-05-24 ENCOUNTER — Telehealth: Payer: Self-pay | Admitting: Oncology

## 2014-05-24 ENCOUNTER — Encounter: Payer: Self-pay | Admitting: Gastroenterology

## 2014-05-24 NOTE — Telephone Encounter (Signed)
S/W PATIENT AND GAVE NP APPT FOR 09/21 @ 1:30 W/DR. SHERRILL.  REFERRING DR. Deatra Ina

## 2014-05-25 ENCOUNTER — Encounter: Payer: Self-pay | Admitting: Radiation Oncology

## 2014-05-25 NOTE — Progress Notes (Signed)
GI Location of Tumor / Histology: Adenocarcinoma of the Esophagus- Distal Esophagus  Joel Miller presented to [facility], MD 0n 03/12/14 with report of belching, choking, dysphagia and globus sensation. He reported no abdominal pain, no chest pain, no coughing, no hoarse voice, no nausea, no sore throat, no stridor, no water brash or no wheezing. Patient c/o "lump" in chest ant points to the lower sternal area. Says has to drink many glasses of water with each mael to 'wash down' his food.. The current episode started more than 1 month ago. The problem occurs frequently. The problem has been waxing and waning. The symptoms are aggravated by certain foods. Pertinent negatives include no fatigue.    Biopsies of Esophagus (if applicable) revealed:  10/16/45 Diagnosis Esophagus, biopsy - ADENOCARCINOMA, INTESTINAL TYPE. PLEASE SEE COMMENT  Past/Anticipated interventions by surgeon, if any: Dr. Erskine Emery - Biopsy of the Esophagus  Past/Anticipated interventions by medical oncology, if any: Appointment with Dr. Julieanne Manson on 05/28/14  Weight changes, if any: full liquids. As of 05/17/14 he's lost about 35 pounds in the last several months   Bowel/Bladder complaints, if any:  Bladder fine, bowel movements 2x week  Nausea / Vomiting, if any: no nausea, feels like he wants to puke stated sour stomach all the time , not eating solid foods, drinks boost, soups,   Pain issues, if any: mid  Thoracic back pain in the afternoons after working  SAFETY ISSUES:no  Prior radiation? NO  Pacemaker/ICD? NO  Possible current pregnancy? NO  Is the patient on methotrexate? No  Current Complaints / other details:  Married, 3 children, brother prostate ca, prostatectomy, father deceased cancer 24-Dec-2000 for 10 years, larynx and metastasized ,heavy smoker  Never Smoker, Social drinking, No illicit drug use, smokes a cigar occasionally for 2-3 years

## 2014-05-28 ENCOUNTER — Telehealth: Payer: Self-pay | Admitting: Oncology

## 2014-05-28 ENCOUNTER — Telehealth: Payer: Self-pay | Admitting: *Deleted

## 2014-05-28 ENCOUNTER — Encounter: Payer: Self-pay | Admitting: Radiation Oncology

## 2014-05-28 ENCOUNTER — Encounter: Payer: Self-pay | Admitting: Oncology

## 2014-05-28 ENCOUNTER — Ambulatory Visit
Admission: RE | Admit: 2014-05-28 | Discharge: 2014-05-28 | Disposition: A | Discharge status: Home / Self Care | Payer: BC Managed Care – PPO | Source: Ambulatory Visit | Attending: Radiation Oncology | Admitting: Radiation Oncology

## 2014-05-28 ENCOUNTER — Ambulatory Visit: Payer: BC Managed Care – PPO

## 2014-05-28 ENCOUNTER — Ambulatory Visit (HOSPITAL_BASED_OUTPATIENT_CLINIC_OR_DEPARTMENT_OTHER): Payer: BC Managed Care – PPO | Admitting: Oncology

## 2014-05-28 VITALS — BP 127/70 | HR 91 | Temp 97.9°F | Resp 20 | Ht 69.5 in | Wt 249.2 lb

## 2014-05-28 VITALS — BP 126/73 | HR 61 | Temp 98.4°F | Resp 20 | Ht 69.5 in | Wt 250.1 lb

## 2014-05-28 DIAGNOSIS — R634 Abnormal weight loss: Secondary | ICD-10-CM

## 2014-05-28 DIAGNOSIS — K219 Gastro-esophageal reflux disease without esophagitis: Secondary | ICD-10-CM | POA: Insufficient documentation

## 2014-05-28 DIAGNOSIS — Z7982 Long term (current) use of aspirin: Secondary | ICD-10-CM | POA: Diagnosis not present

## 2014-05-28 DIAGNOSIS — R918 Other nonspecific abnormal finding of lung field: Secondary | ICD-10-CM

## 2014-05-28 DIAGNOSIS — C155 Malignant neoplasm of lower third of esophagus: Secondary | ICD-10-CM | POA: Diagnosis not present

## 2014-05-28 DIAGNOSIS — E785 Hyperlipidemia, unspecified: Secondary | ICD-10-CM

## 2014-05-28 DIAGNOSIS — Z510 Encounter for antineoplastic radiation therapy: Secondary | ICD-10-CM | POA: Insufficient documentation

## 2014-05-28 DIAGNOSIS — R630 Anorexia: Secondary | ICD-10-CM

## 2014-05-28 DIAGNOSIS — C160 Malignant neoplasm of cardia: Secondary | ICD-10-CM

## 2014-05-28 DIAGNOSIS — E119 Type 2 diabetes mellitus without complications: Secondary | ICD-10-CM | POA: Insufficient documentation

## 2014-05-28 DIAGNOSIS — Z79899 Other long term (current) drug therapy: Secondary | ICD-10-CM | POA: Diagnosis not present

## 2014-05-28 DIAGNOSIS — I10 Essential (primary) hypertension: Secondary | ICD-10-CM

## 2014-05-28 DIAGNOSIS — F17200 Nicotine dependence, unspecified, uncomplicated: Secondary | ICD-10-CM | POA: Diagnosis not present

## 2014-05-28 DIAGNOSIS — E291 Testicular hypofunction: Secondary | ICD-10-CM | POA: Insufficient documentation

## 2014-05-28 DIAGNOSIS — R1310 Dysphagia, unspecified: Secondary | ICD-10-CM

## 2014-05-28 HISTORY — DX: Malignant neoplasm of esophagus, unspecified: C15.9

## 2014-05-28 HISTORY — DX: Gastro-esophageal reflux disease without esophagitis: K21.9

## 2014-05-28 NOTE — Progress Notes (Signed)
Radiation Oncology         [phone]) [phone] ________________________________  Initial outpatient Consultation  Name: Joel Miller MRN: [id number]  Date: 05/28/2014  DOB: 1956/05/07  QZ:RAQTMAU,QJFHLKT DAVID, MD  [name], MD   REFERRING PHYSICIAN: [name], MD  DIAGNOSIS:  Gastro-esophageal junction adenocarcinoma, staging incomplete, TxN2Mx  HISTORY OF PRESENT ILLNESS::Joel Miller is a 58 y.o. male who presented with dysphagia to solids over about 9 months. Currently able to only swallow liquids, able to stay hydrated. Weight loss (unintentional) of about 35 pounds in the last 4-5 months; of note, he lost about 100 pounds intentionally over 2 years. He also reports a sour stomach, odynophagia, decreased appetite, and postprandial pain in his upper abdomen and lower chest.  He initially tried pantoprazole through his primary doctor in April, but this did not help his symptoms very much. Ultimately, his GI physician, Dr. Deatra Ina, performed upper endoscopy on 05/18/14.    Per report: "Starting at the gastric cardia and extending into the lower one third of the esophagus there was a circumferential, exophytic, friable tumor causing a long stricture. The 9 mm gastroscope easily traversed this area. Tumor was seen in clusters throughout the esophagus up to approximately 25 cm from the incisors." No endoscopic ultrasound has been performed.  Biopsies revealed: HER2 neu positive intestinal type adenocarcinoma.  CT of C/A/P on 05-22-14 revealed a distal esophageal mass, enlarged adenopathy at the distal esophagus/GE junction, and porta hepatis, and left periaortic regions.  Also, multiple bilateral pulmonary nodules that warrant observation, 2-57m in size. I reviewed these images with the patient and his wife.  PET hasn't been ordered, yet.  Dr SLearta Coddingsees him today for new consultation.  He also reports that he has been more tired in usual. He has had pain in his midthoracic region of  his back over the past several months as well. He denies any smoking history other than occasional cigars. He denies chewing tobacco. He denies alcohol abuse.He reports that he has cut down on drinking over the past years and drinks alcohol socially.  [place] [employer] He is here with his wife, whose father died of esophageal cancer in the past.   PREVIOUS RADIATION THERAPY: No  PAST MEDICAL HISTORY:  has a past medical history of Hypertension; Hyperlipidemia; Obesity; Hypogonadism male; Vitamin D deficiency; Esophageal cancer (05/18/14); Allergy; Diabetes mellitus without complication; and GERD (gastroesophageal reflux disease).    PAST SURGICAL HISTORY: Past Surgical History  Procedure Laterality Date  . Colonoscopy    . Biospy of esphagus  05/18/14    FAMILY HISTORY: family history includes Alzheimer's disease in his mother; Cancer (age of onset: 667 in his father; Hypertension in his mother.  SOCIAL HISTORY:  reports that he has been smoking Cigars.  He has never used smokeless tobacco. He reports that he drinks about one ounce of alcohol per week. He reports that he does not use illicit drugs.  ALLERGIES: Ace inhibitors  MEDICATIONS:  Current Outpatient Prescriptions  Medication Sig Dispense Refill  . aspirin 81 MG tablet Take 81 mg by mouth daily.      . Cholecalciferol (VITAMIN D PO) Take 10,000 Units by mouth daily.      .Marland KitchenFREESTYLE LITE test strip CHECK BLOOD SUGAR ONCE DAILY  100 each  11  . losartan (COZAAR) 100 MG tablet Take 1 tablet (100 mg total) by mouth daily.  90 tablet  0  . naproxen sodium (ANAPROX)  220 MG tablet Take 220 mg by mouth as needed.      . pantoprazole (PROTONIX) 40 MG tablet Take 1 tablet 2 x daily for Acid Indigestion & Reflux  60 tablet  99  . testosterone (ANDROGEL) 50 MG/5GM GEL Place 5 g onto the skin daily. Apply 2 pumps daily  Dispense 1pump per month  1 Package  5  . metFORMIN  (GLUCOPHAGE XR) 500 MG 24 hr tablet 2 pills twice daily with food  120 tablet  2  . Na Sulfate-K Sulfate-Mg Sulf (SUPREP BOWEL PREP) SOLN Take 1 kit by mouth once.  1 Bottle  0  . oxazepam (SERAX) 15 MG capsule Take 1 to 2 caps 1 hour before bedtime as needed for sleep Hasn't started taking yet. Will stop antacids before taking.      Marland Kitchen zolpidem (AMBIEN CR) 12.5 MG CR tablet Take 12.5 mg by mouth at bedtime.        No current facility-administered medications for this encounter.    REVIEW OF SYSTEMS:  Notable for that above.   PHYSICAL EXAM:  height is 5' 9.5" (1.765 m) and weight is 249 lb 3.2 oz (113.036 kg). His oral temperature is 97.9 F (36.6 C). His blood pressure is 127/70 and his pulse is 91. His respiration is 20.   General: Alert and oriented, in no acute distress HEENT: Head is normocephalic. Pupils are equally round and reactive to light. Extraocular movements are intact. Oropharynx is clear. Neck: Neck is supple, no palpable cervical or supraclavicular lymphadenopathy. Heart: Regular in rate and rhythm with no murmurs, rubs, or gallops. Chest: Clear to auscultation bilaterally, with no rhonchi, wheezes, or rales. Abdomen: Soft, nontender, nondistended, with no rigidity or guarding. Normoactive bowel sounds Extremities: No cyanosis or edema. Lymphatics: see neck Skin: No concerning lesions. Musculoskeletal: symmetric strength and muscle tone throughout. No tenderness over spine. Neurologic: Cranial nerves II through XII are grossly intact. No obvious focalities. Speech is fluent. Coordination is intact. Psychiatric: Judgment and insight are intact. Affect is appropriate.   ECOG = 0  0 - Asymptomatic (Fully active, able to carry on all predisease activities without restriction)  1 - Symptomatic but completely ambulatory (Restricted in physically strenuous activity but ambulatory and able to carry out work of a light or sedentary nature. For example, light housework, office  work)  2 - Symptomatic, <50% in bed during the day (Ambulatory and capable of all self care but unable to carry out any work activities. Up and about more than 50% of waking hours)  3 - Symptomatic, >50% in bed, but not bedbound (Capable of only limited self-care, confined to bed or chair 50% or more of waking hours)  4 - Bedbound (Completely disabled. Cannot carry on any self-care. Totally confined to bed or chair)  5 - Death   Eustace Pen MM, Creech RH, Tormey DC, et al. [phone]). "Toxicity and response criteria of the [facility]". [place] Oncol. 5 (6): 649-55   LABORATORY DATA:  No results found for this basename: WBC,  HGB,  HCT,  MCV,  PLT   CMP  No results found for this basename: na,  k,  cl,  co2,  glucose,  bun,  creatinine,  calcium,  prot,  albumin,  ast,  alt,  alkphos,  bilitot,  gfrnonaa,  gfraa         RADIOGRAPHY: Ct Chest W Contrast  05/22/2014   CLINICAL DATA:  Esophageal mass.  EXAM: CT CHEST, ABDOMEN, AND PELVIS  WITH CONTRAST  TECHNIQUE: Multidetector CT imaging of the chest, abdomen and pelvis was performed following the standard protocol during bolus administration of intravenous contrast.  CONTRAST:  133m OMNIPAQUE IOHEXOL 300 MG/ML  SOLN  COMPARISON:  None.  FINDINGS: CT CHEST FINDINGS  No enlarged axillary, mediastinal or hilar lymphadenopathy. Heart is normal in size. No pericardial effusion. Aorta and main pulmonary artery are normal in caliber. There is circumferential wall thickening of the distal esophagus (image 42; series 2). Multiple sub cm lymph nodes are demonstrated adjacent to the distal aspect of the esophagus.  Central airways are patent. Multiple bilateral pulmonary nodules are demonstrated including a 3 mm nodule within the left fissure (image 27; series 3); 3 mm right lower lobe nodule (image 33; series 3); 4 mm right lower lobe nodule (image 31; series 3); 5 mm right upper lobe nodule (image 29; series 3); 4 mm right upper lobe  nodule (image 12; series 3). No pleural effusion or pneumothorax.  CT ABDOMEN AND PELVIS FINDINGS  Liver is normal in size and contour. Focal fatty deposition adjacent to the falciform ligament. Gallbladder is unremarkable. Portal vein is patent. There is a 1.4 cm porta hepatic lymph node (image 58; series 2). Spleen, pancreas and bilateral adrenal glands are unremarkable. Kidneys enhance symmetrically with contrast. No hydronephrosis.  Normal caliber abdominal aorta. Peripheral noncalcified atherosclerotic plaque. Fat containing right inguinal hernia. Urinary bladder is unremarkable. Central dystrophic calcifications within the prostate.  No abnormal bowel wall thickening. No evidence for bowel obstruction. No free fluid or free intraperitoneal air. Normal appendix.  Upper abdominal mesenteric fat stranding. There is a 1.9 x 1.6 cm lymph node adjacent to the GE junction (image 51; series 2) additionally there is a 2.4 x 1.1 cm left periaortic lymph node (image 60; series 2). Multiple additional adjacent subcentimeter retroperitoneal lymph nodes are identified.  Sclerotic lesion within the right ischium (image 124; series 2). Degenerative changes of the lower lumbar spine. Probable chronic transverse process fractures of the left L3 and L4 transverse processes. No aggressive or acute appearing osseous lesions.  IMPRESSION: 1. Circumferential wall thickening of the distal esophagus compatible with esophageal mass/adenocarcinoma. 2. Enlarged lymph node adjacent to the distal esophagus at the GE junction concerning for metastatic adenopathy. Additionally there is a prominent porta hepatic lymph node and an enlarged left periaortic lymph node which may represent metastatic adenopathy. 3. Multiple bilateral pulmonary nodules. While these may represent sequelae of prior infectious/inflammatory process, metastatic disease is not excluded. 4. Nonspecific upper abdominal mesenteric fat stranding.   Electronically Signed    By: DLovey NewcomerM.D.   On: 05/22/2014 16:33   Ct Abdomen Pelvis W Contrast  05/22/2014   CLINICAL DATA:  Esophageal mass.  EXAM: CT CHEST, ABDOMEN, AND PELVIS WITH CONTRAST  TECHNIQUE: Multidetector CT imaging of the chest, abdomen and pelvis was performed following the standard protocol during bolus administration of intravenous contrast.  CONTRAST:  1035mOMNIPAQUE IOHEXOL 300 MG/ML  SOLN  COMPARISON:  None.  FINDINGS: CT CHEST FINDINGS  No enlarged axillary, mediastinal or hilar lymphadenopathy. Heart is normal in size. No pericardial effusion. Aorta and main pulmonary artery are normal in caliber. There is circumferential wall thickening of the distal esophagus (image 42; series 2). Multiple sub cm lymph nodes are demonstrated adjacent to the distal aspect of the esophagus.  Central airways are patent. Multiple bilateral pulmonary nodules are demonstrated including a 3 mm nodule within the left fissure (image 27; series 3); 3 mm right lower lobe nodule (  image 33; series 3); 4 mm right lower lobe nodule (image 31; series 3); 5 mm right upper lobe nodule (image 29; series 3); 4 mm right upper lobe nodule (image 12; series 3). No pleural effusion or pneumothorax.  CT ABDOMEN AND PELVIS FINDINGS  Liver is normal in size and contour. Focal fatty deposition adjacent to the falciform ligament. Gallbladder is unremarkable. Portal vein is patent. There is a 1.4 cm porta hepatic lymph node (image 58; series 2). Spleen, pancreas and bilateral adrenal glands are unremarkable. Kidneys enhance symmetrically with contrast. No hydronephrosis.  Normal caliber abdominal aorta. Peripheral noncalcified atherosclerotic plaque. Fat containing right inguinal hernia. Urinary bladder is unremarkable. Central dystrophic calcifications within the prostate.  No abnormal bowel wall thickening. No evidence for bowel obstruction. No free fluid or free intraperitoneal air. Normal appendix.  Upper abdominal mesenteric fat stranding. There is  a 1.9 x 1.6 cm lymph node adjacent to the GE junction (image 51; series 2) additionally there is a 2.4 x 1.1 cm left periaortic lymph node (image 60; series 2). Multiple additional adjacent subcentimeter retroperitoneal lymph nodes are identified.  Sclerotic lesion within the right ischium (image 124; series 2). Degenerative changes of the lower lumbar spine. Probable chronic transverse process fractures of the left L3 and L4 transverse processes. No aggressive or acute appearing osseous lesions.  IMPRESSION: 1. Circumferential wall thickening of the distal esophagus compatible with esophageal mass/adenocarcinoma. 2. Enlarged lymph node adjacent to the distal esophagus at the GE junction concerning for metastatic adenopathy. Additionally there is a prominent porta hepatic lymph node and an enlarged left periaortic lymph node which may represent metastatic adenopathy. 3. Multiple bilateral pulmonary nodules. While these may represent sequelae of prior infectious/inflammatory process, metastatic disease is not excluded. 4. Nonspecific upper abdominal mesenteric fat stranding.   Electronically Signed   By: Lovey Newcomer M.D.   On: 05/22/2014 16:33      IMPRESSION/PLAN: Gastro-esophageal junction adenocarcinoma, staging incomplete.   1) I discussed the patient with Dr. Benay Spice of medical oncology; he has been discussed at the multidisciplinary tumor board. The consensus was that if he does not have stage IV disease, he would be a candidate for neoadjuvant chemoradiation and then possible surgery. I talked to the patient will patient about this as a possible treatment algorithm. He understands the final recommendations may change depending on the results of his PET scan and his assessment by Dr Benay Spice.   2) PET scan has been scheduled for tomorrow morning. I will call him with the results.  3) Tentative CT simulation has been ordered to take place the day after his PET scan for timely treatment planning   4)  I will refer him to our nutritionist in light of weight loss. He is able to swallow liquids so we will hold off on a feeding tube for now   5) If Dr. Benay Spice continues to agree that neoadjuvant chemotherapy followed by surgery is the best treatment algorithm, I will defer to him on ordering the cardiothoracic surgery consultation.   6) We discussed the logistics, risks, benefits, and side effects of approximately 6 weeks of radiotherapy. Side effects may include but not necessarily be limited to: Fatigue, skin irritation, nausea, diarrhea, heartburn pain,  internal organ injury of the chest and abdomen. IMRT may be helpful for planning to avoid spinal cord, kidneys, bowel, liver, lung, heart.  No guarantees of treatment were given. A consent form was signed and placed in the patient's medical record. He is enthusiastic to proceed.  __________________________________________   Eppie Gibson, MD

## 2014-05-28 NOTE — Progress Notes (Signed)
[facility] Patient Consult   Referring MD: [place] 58 y.o.  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 He saw his primary physician in April and was placed on antacid therapy. When this did not help he was referred to Dr. Deatra [facility]. Beginning in June he noted the onset of progressive solid dysphagia and reports essentially following a liquid diet for the past 3 months.  He was taken to an upper endoscopy procedure 05/18/2014. Beginning at the gastric cardia and extending into the lower one third of the esophagus a circumferential friable tumor was noted. Tumor was seen" clusters "throughout the esophagus up to 25 cm from the incisors. Multiple biopsies were obtained. 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  He was referred for CTs of the chest, abdomen, and pelvis on 05/22/2014. No enlarged mediastinal hilar nodes. Circumferential wall thickening was noted at the distal esophagus with multiple subcentimeter lymph nodes adjacent to the distal esophagus. Multiple bilateral pulmonary nodule. No pleural effusion or pneumothorax. The liver appeared normal. 1.4 cm porta hepatis node. The adrenal glands appeared unremarkable. 1.9 cm lymph node adjacent to the GE junction, 2.4 cm left periaortic node. A sclerotic lesion was noted in the right ischium.    Past Medical History  Diagnosis Date  . Hypertension   . Hyperlipidemia   . Obesity   . Hypogonadism male   . Vitamin D deficiency   . 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 Allergy     HEY FEVER  . Diabetes mellitus without complication     type II  . GERD (gastroesophageal reflux disease)     .  History of colon polyps  Past Surgical History  Procedure Laterality Date  . Colonoscopy    . Biospy of esphagus  05/18/14    Medications: Reviewed  Allergies:  Allergies    Allergen Reactions  . Ace Inhibitors     cough    Family history: His father had larynx cancer and was a smoker. He died of "intestinal colon cancer. His brother had prostate cancer. No other family history of cancer  Social History:   He works in the [place] [facility]. He lives in [place]. He smokes cigars. Mild alcohol use.   ROS:   Positives include: Anorexia, 50 pound weight loss, solid dysphagia, mild nausea, and dry cough in the evening, mid back pain since earlier this year  A complete ROS was otherwise negative.  Physical Exam:  Blood pressure 126/73, pulse 61, temperature 98.4 F (36.9 C), temperature source Oral, resp. rate 20, height 5' 9.5" (1.765 m), weight 250 lb 1.6 oz (113.445 kg).  HEENT: Oropharynx without visible mass, neck without mass Lungs: Clear bilaterally Cardiac: Regular rate and rhythm Abdomen: No hepatosplenomegaly, nontender, no mass GU: Testes without mass  Vascular: No leg edema Lymph nodes: No cervical, supraclavicular, axillary, or inguinal nodes Neurologic: Alert and oriented, the motor exam appears intact in the upper and lower extremities Skin: No rash Musculoskeletal: No spine tenderness     Imaging: I reviewed the 05/22/2014 CT images     Assessment/Plan:   1. Adenocarcinoma the esophagus/GE junction-status post an endoscopy 05/18/2014 confirming a gastric cardia/lower esophageal mass with tumor extending proximally in the esophagus to 25 cm from the incisors  HER-2/neu amplified  Staging CT scan 05/22/2014 with indeterminate bilateral pulmonary nodules; and prominent paraesophageal, porta hepatis, and para-aortic lymph  nodes  2.   anorexia/weight loss  3.    solid dysphagia  4.    Diabetes  5.    Hypertension  6.    Hyperlipidemia  7.    back pain relieved with naproxen   Disposition:   [facility] has been diagnosed with adenocarcinoma of the lower esophagus/GE junction. I discussed treatment options with him.  His case was presented at the GI tumor conference 05/23/2014 and I have discussed the case with Dr. Isidore Miller.  The tiny lung nodules were felt to most likely be benign, but he may have metastatic lymphadenopathy. He is scheduled for a staging PET scan 05/29/2014.  I recommend treatment with concurrent radiation and weekly Taxol/carboplatin based on the CROSS study if the staging PET scan reveals no evidence of distant metastatic disease. He understands the treatment plan will change if the PET scan confirms stage IV disease.  We discussed the potential toxicities associated with the Taxol/carboplatin regimen including the chance for nausea/vomiting, alopecia, mucositis, diarrhea, and hematologic toxicity. We discussed the potential for an allergic reaction with Taxol and carboplatin. We reviewed the neuropathy and bone pain associated with Taxol. We also discussed the likelihood of developing esophagitis. He agrees to proceed.  The plan is to deliver weekly Taxol/carboplatin for 5 weeks during the course of radiation.  [facility] will be referred for a chemotherapy teaching class. He is scheduled to meet with the [facility] nutritionist later this week. We will watch his nutritional status throughout the course of chemotherapy and recommend a feeding tube as indicated.  I made a referral to [facility] to consider surgery at the completion of neoadjuvant therapy.  Approximately 50 minutes were spent with patient today. The majority of the time was used for counseling and coordination of care.    [place], [place] 05/28/2014, 2:31 PM

## 2014-05-28 NOTE — Telephone Encounter (Signed)
gv adn printed appt sched and avs for pt fro Sept and OCT...Marland KitchenCindy from [facility] will call me and pt with appt....emailed MW to add tx for 10.5

## 2014-05-28 NOTE — Progress Notes (Signed)
Please see the Nurse Progress Note in the MD Initial Consult Encounter for this patient. 

## 2014-05-28 NOTE — Telephone Encounter (Signed)
CALLED PATIENT TO INFORM OF NUTRITION APPT. WITH BARBARA NEFF ON 06-01-14 @ 9 AM , SPOKE WITH PATIENT AND HE IS AWARE OF THIS APPT.

## 2014-05-29 ENCOUNTER — Other Ambulatory Visit: Payer: Self-pay | Admitting: *Deleted

## 2014-05-29 ENCOUNTER — Ambulatory Visit: Payer: BC Managed Care – PPO | Admitting: Radiation Oncology

## 2014-05-29 ENCOUNTER — Ambulatory Visit: Admission: RE | Admit: 2014-05-29 | Payer: BC Managed Care – PPO | Source: Ambulatory Visit

## 2014-05-29 ENCOUNTER — Telehealth: Payer: Self-pay | Admitting: *Deleted

## 2014-05-29 ENCOUNTER — Ambulatory Visit (HOSPITAL_COMMUNITY)
Admission: RE | Admit: 2014-05-29 | Discharge: 2014-05-29 | Disposition: A | Discharge status: Home / Self Care | Payer: BC Managed Care – PPO | Source: Ambulatory Visit | Attending: Radiation Oncology | Admitting: Radiation Oncology

## 2014-05-29 ENCOUNTER — Encounter (HOSPITAL_COMMUNITY): Payer: Self-pay

## 2014-05-29 ENCOUNTER — Encounter: Payer: Self-pay | Admitting: Radiation Oncology

## 2014-05-29 DIAGNOSIS — C155 Malignant neoplasm of lower third of esophagus: Secondary | ICD-10-CM | POA: Diagnosis not present

## 2014-05-29 LAB — GLUCOSE, CAPILLARY: GLUCOSE-CAPILLARY: 104 mg/dL — AB (ref 70–99)

## 2014-05-29 MED ORDER — FLUDEOXYGLUCOSE F - 18 (FDG) INJECTION: 12.4000 | Freq: Once | INTRAVENOUS | Status: AC | PRN

## 2014-05-29 MED ORDER — DEXAMETHASONE 4 MG PO TABS: ORAL_TABLET | ORAL | Status: DC

## 2014-05-29 MED ORDER — PROCHLORPERAZINE MALEATE 10 MG PO TABS: 10.0000 mg | ORAL_TABLET | Freq: Four times a day (QID) | ORAL | Status: DC | PRN

## 2014-05-29 MED ORDER — SUCRALFATE 1 G PO TABS: 1.0000 g | ORAL_TABLET | Freq: Three times a day (TID) | ORAL | Status: DC

## 2014-05-29 MED ORDER — DEXAMETHASONE 2 MG PO TABS: ORAL_TABLET | ORAL | Status: DC

## 2014-05-29 NOTE — Addendum Note (Signed)
Addended by: Domenic Schwab on: 05/29/2014 03:36 PM   Modules accepted: Orders, Medications

## 2014-05-29 NOTE — Progress Notes (Signed)
Discussed PET results with patient. My impression is he has Stage III disease.  Only one node is "hot" on PET, but there appears to be extensive esophageal involvement.  We talked about how this will affect the length of his fields.  Will proceed with RT planning tomorrow. He is on board with this plan. -----------------------------------  Eppie Gibson, MD

## 2014-05-29 NOTE — Telephone Encounter (Signed)
Per staff message and POF I have scheduled appts. Advised scheduler of appts. JMW  

## 2014-05-30 ENCOUNTER — Ambulatory Visit
Admission: RE | Admit: 2014-05-30 | Discharge: 2014-05-30 | Disposition: A | Discharge status: Home / Self Care | Payer: BC Managed Care – PPO | Source: Ambulatory Visit | Attending: Radiation Oncology | Admitting: Radiation Oncology

## 2014-05-30 DIAGNOSIS — Z510 Encounter for antineoplastic radiation therapy: Secondary | ICD-10-CM | POA: Diagnosis not present

## 2014-05-30 DIAGNOSIS — C155 Malignant neoplasm of lower third of esophagus: Secondary | ICD-10-CM

## 2014-05-30 NOTE — Progress Notes (Signed)
Simulation, IMRT treatment planning, and Special treatment procedure note   Outpatient  Diagnosis:   ICD-9-CM ICD-10-CM  1. Carcinoma of distal third of esophagus 150.5 C15.5     The patient was taken to the CT simulator and laid in the supine position on the table. An Aquaplast head and shoulder mask was custom fitted to the patient's anatomy. High-resolution CT axial imaging was obtained of the head and neck with contrast. I verified that the quality of the imaging is good for treatment planning.   Treatment planning note I plan to treat the patient with IMRT. I plan to treat the patient's esophageal tumor (which is extensive, requiring treatment of approximately 2/3 of the esophagus) and regional nodes. I plan to treat to a total dose of 39.6 Gray at 1.8 Gy/  fraction initially and follow this by a boost of 10.8 Gy at 1.8 Gy/ fraction to gross disease.  IMRT planning Note  IMRT is an important modality to deliver adequate dose to the patient's extensive span of at risk tissues while sparing the patient's normal structures, including the: lungs, heart, bowel, kidneys and cord. DVHs requested of these organs.  This justifies the use of IMRT in the patient's treatment.   Special Treatment Procedure Note:  The patient will be receiving chemotherapy concurrently. Chemotherapy heightens the risk of side effects. I have considered this during the patient's treatment planning process and will monitor the patient accordingly for side effects on a weekly basis. Concurrent chemotherapy increases the complexity of this patient's treatment and therefore this constitutes a special treatment procedure.  -----------------------------------  Eppie Gibson, MD

## 2014-05-31 ENCOUNTER — Other Ambulatory Visit (HOSPITAL_BASED_OUTPATIENT_CLINIC_OR_DEPARTMENT_OTHER): Payer: BC Managed Care – PPO

## 2014-05-31 ENCOUNTER — Encounter: Payer: Self-pay | Admitting: *Deleted

## 2014-05-31 ENCOUNTER — Other Ambulatory Visit: Payer: BC Managed Care – PPO

## 2014-05-31 DIAGNOSIS — C155 Malignant neoplasm of lower third of esophagus: Secondary | ICD-10-CM

## 2014-05-31 DIAGNOSIS — C160 Malignant neoplasm of cardia: Secondary | ICD-10-CM

## 2014-05-31 LAB — CBC WITH DIFFERENTIAL/PLATELET
BASO%: 0.5 % (ref 0.0–2.0)
Basophils Absolute: 0 10*3/uL (ref 0.0–0.1)
EOS%: 1.7 % (ref 0.0–7.0)
Eosinophils Absolute: 0.1 10*3/uL (ref 0.0–0.5)
HEMATOCRIT: 41.5 % (ref 38.4–49.9)
HGB: 13.7 g/dL (ref 13.0–17.1)
LYMPH#: 1.3 10*3/uL (ref 0.9–3.3)
LYMPH%: 20.4 % (ref 14.0–49.0)
MCH: 29 pg (ref 27.2–33.4)
MCHC: 33 g/dL (ref 32.0–36.0)
MCV: 87.7 fL (ref 79.3–98.0)
MONO#: 0.5 10*3/uL (ref 0.1–0.9)
MONO%: 7.1 % (ref 0.0–14.0)
NEUT%: 70.3 % (ref 39.0–75.0)
NEUTROS ABS: 4.4 10*3/uL (ref 1.5–6.5)
Platelets: 165 10*3/uL (ref 140–400)
RBC: 4.73 10*6/uL (ref 4.20–5.82)
RDW: 14.2 % (ref 11.0–14.6)
WBC: 6.3 10*3/uL (ref 4.0–10.3)

## 2014-05-31 LAB — COMPREHENSIVE METABOLIC PANEL (CC13)
ALBUMIN: 3.8 g/dL (ref 3.5–5.0)
ALT: 16 U/L (ref 0–55)
ANION GAP: 8 meq/L (ref 3–11)
AST: 17 U/L (ref 5–34)
Alkaline Phosphatase: 61 U/L (ref 40–150)
BUN: 10.6 mg/dL (ref 7.0–26.0)
CALCIUM: 10 mg/dL (ref 8.4–10.4)
CO2: 28 meq/L (ref 22–29)
Chloride: 105 mEq/L (ref 98–109)
Creatinine: 1.1 mg/dL (ref 0.7–1.3)
Glucose: 79 mg/dl (ref 70–140)
Potassium: 4.5 mEq/L (ref 3.5–5.1)
SODIUM: 141 meq/L (ref 136–145)
TOTAL PROTEIN: 6.7 g/dL (ref 6.4–8.3)
Total Bilirubin: 0.43 mg/dL (ref 0.20–1.20)

## 2014-06-01 ENCOUNTER — Ambulatory Visit: Payer: BC Managed Care – PPO | Admitting: Nutrition

## 2014-06-01 LAB — CEA: CEA: <0.5 ng/mL (ref 0.0–5.0)

## 2014-06-01 NOTE — Progress Notes (Signed)
58 year old male diagnosed with distal gastroesophageal cancer.  He is a patient of Dr. Isidore Moos and Dr. Benay Spice.  He will receive concurrent chemoradiation therapy.  Past medical history includes hypertension, hyperlipidemia, non-insulin-dependent diabetes, vitamin D deficiency, neoplasm of the prostate, GERD, dysphasia, and obesity.  Medications include vitamin D, Decadron, Glucophage XR, Protonix, Compazine, Caraate.  Labs include glucose of 104.  Height: 69.5 inches.   Weight: 250.1 pounds. Usual body weight: 300-350 pounds. BMI: 36.42.  Estimated nutrition needs: 2600-2800 calories, 130-145 g protein, 2.8 L fluid.  Patient has lost approximately 100 pounds over the past two-years.  Patient states weight loss was intentional at the beginning.  In June of this year, he developed swallowing difficulties and poor appetite.  He has been following a liquid diet secondary to solid food dysphasia.  Patient has experienced approximately 50 pound weight loss since February 2015.  Patient is consuming one oral nutrition supplement daily.  Sometimes he will consume two.  He is trying to eat cream soups.  Oral intake is inadequate at this time.  Patient meets criteria for severe malnutrition in the context of chronic illness secondary to 7% weight loss in one week, less than 75% of energy intake for greater than one month, and severe depletion of body fat and muscle mass on physical exam.  Nutrition diagnosis: Inadequate oral intake related to new diagnosis of gastroesophageal cancer as evidenced by severe weight loss.  Intervention: Educated patient to consume small, frequent, high-calorie, meals and snacks.  Provided a fact sheet.  Reviewed minimum calorie and protein needs. Educated patient on altering textures and consistencies of foods.  Discussed blenderizing meals. Encouraged patient to increase oral nutrition supplements to a minimum of 3-4 daily. Provided samples of oral nutrition  supplements along with protein samples and recipes. Questions were answered.  Teach back method used.  Contact information given.  Monitoring, evaluation, goals: Patient will tolerate adequate calories and protein to minimize further weight loss throughout treatment.  Recommend feeding tube if patient unable to meet estimated caloric and protein needs and loses more than 5% of current body weight.  Next visit: Monday, October 5, during chemotherapy.  **Disclaimer: This note was dictated with voice recognition software. Similar sounding words can inadvertently be transcribed and this note may contain transcription errors which may not have been corrected upon publication of note.**

## 2014-06-06 ENCOUNTER — Encounter: Payer: Self-pay | Admitting: Radiation Oncology

## 2014-06-06 DIAGNOSIS — Z510 Encounter for antineoplastic radiation therapy: Secondary | ICD-10-CM | POA: Diagnosis not present

## 2014-06-06 NOTE — Progress Notes (Signed)
Our 3D conformal initial plan for [facility], before any boost, goes to 45Gy.  This results in a lung V5 of 64%.  This is concerning, especially without dose from his boost, which would elevate the V5 further. We will proceed with an IMRT plan to spare the lungs while treating extensive tumor that includes the upper and lower esophagus.  -----------------------------------  Eppie Gibson, MD

## 2014-06-09 ENCOUNTER — Other Ambulatory Visit: Payer: Self-pay | Admitting: Oncology

## 2014-06-10 ENCOUNTER — Other Ambulatory Visit: Payer: Self-pay | Admitting: Oncology

## 2014-06-11 ENCOUNTER — Ambulatory Visit
Admission: RE | Admit: 2014-06-11 | Discharge: 2014-06-11 | Disposition: A | Discharge status: Home / Self Care | Payer: BC Managed Care – PPO | Source: Ambulatory Visit | Attending: Radiation Oncology | Admitting: Radiation Oncology

## 2014-06-11 ENCOUNTER — Encounter: Payer: Self-pay | Admitting: Radiation Oncology

## 2014-06-11 ENCOUNTER — Telehealth: Payer: Self-pay | Admitting: Oncology

## 2014-06-11 ENCOUNTER — Ambulatory Visit: Payer: BC Managed Care – PPO | Admitting: Nutrition

## 2014-06-11 ENCOUNTER — Ambulatory Visit (HOSPITAL_BASED_OUTPATIENT_CLINIC_OR_DEPARTMENT_OTHER): Payer: BC Managed Care – PPO

## 2014-06-11 ENCOUNTER — Ambulatory Visit (HOSPITAL_BASED_OUTPATIENT_CLINIC_OR_DEPARTMENT_OTHER): Payer: BC Managed Care – PPO | Admitting: Oncology

## 2014-06-11 VITALS — BP 125/71 | HR 77 | Temp 98.5°F | Resp 18 | Ht 69.0 in | Wt 244.4 lb

## 2014-06-11 VITALS — Ht 69.0 in | Wt 244.7 lb

## 2014-06-11 VITALS — BP 130/79 | HR 65 | Temp 98.7°F | Resp 18

## 2014-06-11 DIAGNOSIS — C155 Malignant neoplasm of lower third of esophagus: Secondary | ICD-10-CM

## 2014-06-11 DIAGNOSIS — R1310 Dysphagia, unspecified: Secondary | ICD-10-CM | POA: Diagnosis not present

## 2014-06-11 DIAGNOSIS — Z510 Encounter for antineoplastic radiation therapy: Secondary | ICD-10-CM | POA: Insufficient documentation

## 2014-06-11 DIAGNOSIS — Z5111 Encounter for antineoplastic chemotherapy: Secondary | ICD-10-CM

## 2014-06-11 DIAGNOSIS — C160 Malignant neoplasm of cardia: Secondary | ICD-10-CM

## 2014-06-11 DIAGNOSIS — R630 Anorexia: Secondary | ICD-10-CM

## 2014-06-11 DIAGNOSIS — Z23 Encounter for immunization: Secondary | ICD-10-CM

## 2014-06-11 DIAGNOSIS — R130 Aphagia: Secondary | ICD-10-CM

## 2014-06-11 DIAGNOSIS — M549 Dorsalgia, unspecified: Secondary | ICD-10-CM

## 2014-06-11 DIAGNOSIS — I10 Essential (primary) hypertension: Secondary | ICD-10-CM

## 2014-06-11 DIAGNOSIS — R634 Abnormal weight loss: Secondary | ICD-10-CM

## 2014-06-11 DIAGNOSIS — E785 Hyperlipidemia, unspecified: Secondary | ICD-10-CM

## 2014-06-11 DIAGNOSIS — E119 Type 2 diabetes mellitus without complications: Secondary | ICD-10-CM

## 2014-06-11 MED ORDER — DEXAMETHASONE SODIUM PHOSPHATE 20 MG/5ML IJ SOLN
INTRAMUSCULAR | Status: AC
  Filled 2014-06-11: qty 5

## 2014-06-11 MED ORDER — ONDANSETRON 16 MG/50ML IVPB (CHCC)
16.0000 mg | Freq: Once | INTRAVENOUS | Status: AC
  Administered 2014-06-11: 16 mg via INTRAVENOUS

## 2014-06-11 MED ORDER — SODIUM CHLORIDE 0.9 % IV SOLN
Freq: Once | INTRAVENOUS | Status: AC
  Administered 2014-06-11: 14:00:00 via INTRAVENOUS

## 2014-06-11 MED ORDER — DIPHENHYDRAMINE HCL 50 MG/ML IJ SOLN
50.0000 mg | Freq: Once | INTRAMUSCULAR | Status: AC
  Administered 2014-06-11: 50 mg via INTRAVENOUS

## 2014-06-11 MED ORDER — DIPHENHYDRAMINE HCL 50 MG/ML IJ SOLN
INTRAMUSCULAR | Status: AC
  Filled 2014-06-11: qty 1

## 2014-06-11 MED ORDER — SODIUM CHLORIDE 0.9 % IV SOLN
250.0000 mg | Freq: Once | INTRAVENOUS | Status: AC
  Administered 2014-06-11: 250 mg via INTRAVENOUS
  Filled 2014-06-11: qty 25

## 2014-06-11 MED ORDER — DEXAMETHASONE SODIUM PHOSPHATE 20 MG/5ML IJ SOLN
20.0000 mg | Freq: Once | INTRAMUSCULAR | Status: AC
  Administered 2014-06-11: 20 mg via INTRAVENOUS

## 2014-06-11 MED ORDER — PACLITAXEL CHEMO INJECTION 300 MG/50ML
50.0000 mg/m2 | Freq: Once | INTRAVENOUS | Status: AC
  Administered 2014-06-11: 120 mg via INTRAVENOUS
  Filled 2014-06-11: qty 20

## 2014-06-11 MED ORDER — FAMOTIDINE IN NACL 20-0.9 MG/50ML-% IV SOLN
20.0000 mg | Freq: Once | INTRAVENOUS | Status: AC
  Administered 2014-06-11: 20 mg via INTRAVENOUS

## 2014-06-11 MED ORDER — INFLUENZA VAC SPLIT QUAD 0.5 ML IM SUSY
0.5000 mL | PREFILLED_SYRINGE | Freq: Once | INTRAMUSCULAR | Status: AC
  Administered 2014-06-11: 0.5 mL via INTRAMUSCULAR
  Filled 2014-06-11: qty 0.5

## 2014-06-11 MED ORDER — ONDANSETRON 16 MG/50ML IVPB (CHCC)
INTRAVENOUS | Status: AC
  Filled 2014-06-11: qty 16

## 2014-06-11 MED ORDER — FAMOTIDINE IN NACL 20-0.9 MG/50ML-% IV SOLN
INTRAVENOUS | Status: AC
  Filled 2014-06-11: qty 50

## 2014-06-11 NOTE — Progress Notes (Signed)
  [place] OFFICE PROGRESS NOTE   Diagnosis: Esophagus cancer  INTERVAL HISTORY:   He returns as scheduled. He reports the swallowing initially improved after the upper endoscopy but he has developed progressive dysphagia. He is tolerating liquids and pured solids. No pain. Mild nausea.  Objective:  Vital signs in last 24 hours:  Blood pressure 125/71, pulse 77, temperature 98.5 F (36.9 C), temperature source Oral, resp. rate 18, height $RemoveBe'5\' 9"'TyNOuwUJU$  (1.753 m), weight 244 lb 6.4 oz (110.859 kg), SpO2 99.00%.    HEENT: Neck without mass Lymphatics: No cervical or supraclavicular nodes Resp: Lungs clear bilateral Cardio: Regular rate and rhythm GI: No hepatomegaly, nontender Vascular: No leg edema   Lab Results:  Lab Results  Component Value Date   WBC 6.3 05/31/2014   HGB 13.7 05/31/2014   HCT 41.5 05/31/2014   MCV 87.7 05/31/2014   PLT 165 05/31/2014   NEUTROABS 4.4 05/31/2014    Lab Results  Component Value Date   NA 141 05/31/2014    Lab Results  Component Value Date   CEA <0.5 05/31/2014    Medications: I have reviewed the patient's current medications.  Assessment/Plan: 1. Adenocarcinoma the esophagus/GE junction-status post an endoscopy 05/18/2014 confirming a gastric cardia/lower esophageal mass with tumor extending proximally in the esophagus to 25 cm from the incisors HER-2/neu amplified  Staging CT scan 05/22/2014 with indeterminate bilateral pulmonary nodules; and prominent paraesophageal, porta hepatis, and para-aortic lymph nodes Staging PET scan 05/29/2014 confirmed hypermetabolic soft tissue thickening at the distal third of the esophagus extending into the proximal stomach. 2 additional smaller areas of hypermetabolism or noted in the more proximal esophagus with a mildly enlarged hypermetabolic gastrohepatic node. 2. anorexia/weight loss  3. solid dysphagia  4. Diabetes  5. Hypertension  6. Hyperlipidemia  7. back pain relieved with  naproxen    Disposition:  Mr. [facility] appears stable. The plan is to proceed with radiation and concurrent weekly Taxol/carboplatin chemotherapy today. He is scheduled to see Dr. Servando Snare later this week.  He feels that his weight has stabilized. We will watch his weight closely throughout the course of treatment. He is scheduled to meet with the [place] nutritionist. He has a prescription for Carafate slurry to use as needed for esophagitis symptoms.  We reviewed the PET images today.  He will return for an office visit in 2 weeks.  Betsy Coder, MD  06/11/2014  11:37 AM

## 2014-06-11 NOTE — Progress Notes (Signed)
Simulation Verification Note   ICD-9-CM ICD-10-CM  1. Carcinoma of distal third of esophagus 150.5 C15.5    The patient was brought to the treatment unit and placed in the planned treatment position. The clinical setup was verified. Then port films were obtained and uploaded to the radiation oncology medical record software.  The treatment beams were carefully compared against the planned radiation fields. The position location and shape of the radiation fields was reviewed. They targeted volume of tissue appears to be appropriately covered by the radiation beams. Organs at risk appear to be excluded as planned.  Based on my personal review, I approved the simulation verification. The patient's treatment will proceed as planned.  -----------------------------------  Eppie Gibson, MD

## 2014-06-11 NOTE — Progress Notes (Signed)
[employer] has completed 1 fraction to his esophagus.  He denies pain.  He reports only swallowing liquids.  He has a question on how to take carafate.

## 2014-06-11 NOTE — Patient Instructions (Signed)
[facility] Discharge Instructions for Patients Receiving Chemotherapy  Today you received the following chemotherapy agents Paclitaxel/Carboplatin.  To help prevent nausea and vomiting after your treatment, we encourage you to take your nausea medication as directed.    If you develop nausea and vomiting that is not controlled by your nausea medication, call the clinic.   BELOW ARE SYMPTOMS THAT SHOULD BE REPORTED IMMEDIATELY:  *FEVER GREATER THAN 100.5 F  *CHILLS WITH OR WITHOUT FEVER  NAUSEA AND VOMITING THAT IS NOT CONTROLLED WITH YOUR NAUSEA MEDICATION  *UNUSUAL SHORTNESS OF BREATH  *UNUSUAL BRUISING OR BLEEDING  TENDERNESS IN MOUTH AND THROAT WITH OR WITHOUT PRESENCE OF ULCERS  *URINARY PROBLEMS  *BOWEL PROBLEMS  UNUSUAL RASH Items with * indicate a potential emergency and should be followed up as soon as possible.  Feel free to call the clinic you have any questions or concerns. The clinic phone number is [phone].   Paclitaxel injection What is this medicine? PACLITAXEL (PAK li TAX el) is a chemotherapy drug. It targets fast dividing cells, like cancer cells, and causes these cells to die. This medicine is used to treat ovarian cancer, breast cancer, and other cancers. This medicine may be used for other purposes; ask your health care provider or pharmacist if you have questions. COMMON BRAND NAME(S): Onxol, Taxol What should I tell my health care provider before I take this medicine? They need to know if you have any of these conditions: -blood disorders -irregular heartbeat -infection (especially a virus infection such as chickenpox, cold sores, or herpes) -liver disease -previous or ongoing radiation therapy -an unusual or allergic reaction to paclitaxel, alcohol, polyoxyethylated castor oil, other chemotherapy agents, other medicines, foods, dyes, or preservatives -pregnant or trying to get pregnant -breast-feeding How should I use this  medicine? This drug is given as an infusion into a vein. It is administered in a hospital or clinic by a specially trained health care professional. Talk to your pediatrician regarding the use of this medicine in children. Special care may be needed. Overdosage: If you think you have taken too much of this medicine contact a poison control center or emergency room at once. NOTE: This medicine is only for you. Do not share this medicine with others. What if I miss a dose? It is important not to miss your dose. Call your doctor or health care professional if you are unable to keep an appointment. What may interact with this medicine? Do not take this medicine with any of the following medications: -disulfiram -metronidazole This medicine may also interact with the following medications: -cyclosporine -diazepam -ketoconazole -medicines to increase blood counts like filgrastim, pegfilgrastim, sargramostim -other chemotherapy drugs like cisplatin, doxorubicin, epirubicin, etoposide, teniposide, vincristine -quinidine -testosterone -vaccines -verapamil Talk to your doctor or health care professional before taking any of these medicines: -acetaminophen -aspirin -ibuprofen -ketoprofen -naproxen This list may not describe all possible interactions. Give your health care provider a list of all the medicines, herbs, non-prescription drugs, or dietary supplements you use. Also tell them if you smoke, drink alcohol, or use illegal drugs. Some items may interact with your medicine. What should I watch for while using this medicine? Your condition will be monitored carefully while you are receiving this medicine. You will need important blood work done while you are taking this medicine. This drug may make you feel generally unwell. This is not uncommon, as chemotherapy can affect healthy cells as well as cancer cells. Report any side effects. Continue your course of   treatment even though you feel ill  unless your doctor tells you to stop. In some cases, you may be given additional medicines to help with side effects. Follow all directions for their use. Call your doctor or health care professional for advice if you get a fever, chills or sore throat, or other symptoms of a cold or flu. Do not treat yourself. This drug decreases your body's ability to fight infections. Try to avoid being around people who are sick. This medicine may increase your risk to bruise or bleed. Call your doctor or health care professional if you notice any unusual bleeding. Be careful brushing and flossing your teeth or using a toothpick because you may get an infection or bleed more easily. If you have any dental work done, tell your dentist you are receiving this medicine. Avoid taking products that contain aspirin, acetaminophen, ibuprofen, naproxen, or ketoprofen unless instructed by your doctor. These medicines may hide a fever. Do not become pregnant while taking this medicine. Women should inform their doctor if they wish to become pregnant or think they might be pregnant. There is a potential for serious side effects to an unborn child. Talk to your health care professional or pharmacist for more information. Do not breast-feed an infant while taking this medicine. Men are advised not to father a child while receiving this medicine. What side effects may I notice from receiving this medicine? Side effects that you should report to your doctor or health care professional as soon as possible: -allergic reactions like skin rash, itching or hives, swelling of the face, lips, or tongue -low blood counts - This drug may decrease the number of white blood cells, red blood cells and platelets. You may be at increased risk for infections and bleeding. -signs of infection - fever or chills, cough, sore throat, pain or difficulty passing urine -signs of decreased platelets or bleeding - bruising, pinpoint red spots on the skin,  black, tarry stools, nosebleeds -signs of decreased red blood cells - unusually weak or tired, fainting spells, lightheadedness -breathing problems -chest pain -high or low blood pressure -mouth sores -nausea and vomiting -pain, swelling, redness or irritation at the injection site -pain, tingling, numbness in the hands or feet -slow or irregular heartbeat -swelling of the ankle, feet, hands Side effects that usually do not require medical attention (report to your doctor or health care professional if they continue or are bothersome): -bone pain -complete hair loss including hair on your head, underarms, pubic hair, eyebrows, and eyelashes -changes in the color of fingernails -diarrhea -loosening of the fingernails -loss of appetite -muscle or joint pain -red flush to skin -sweating This list may not describe all possible side effects. Call your doctor for medical advice about side effects. You may report side effects to FDA at 1-800-FDA-1088. Where should I keep my medicine? This drug is given in a hospital or clinic and will not be stored at home. NOTE: This sheet is a summary. It may not cover all possible information. If you have questions about this medicine, talk to your doctor, pharmacist, or health care provider.  2015, [employer]/Gold Standard. (2012-10-17 16:41:21)  Carboplatin injection What is this medicine? CARBOPLATIN (KAR boe pla tin) is a chemotherapy drug. It targets fast dividing cells, like cancer cells, and causes these cells to die. This medicine is used to treat ovarian cancer and many other cancers. This medicine may be used for other purposes; ask your health care provider or pharmacist if you have questions. COMMON   BRAND NAME(S): Paraplatin What should I tell my health care provider before I take this medicine? They need to know if you have any of these conditions: -blood disorders -hearing problems -kidney disease -recent or ongoing radiation  therapy -an unusual or allergic reaction to carboplatin, cisplatin, other chemotherapy, other medicines, foods, dyes, or preservatives -pregnant or trying to get pregnant -breast-feeding How should I use this medicine? This drug is usually given as an infusion into a vein. It is administered in a hospital or clinic by a specially trained health care professional. Talk to your pediatrician regarding the use of this medicine in children. Special care may be needed. Overdosage: If you think you have taken too much of this medicine contact a poison control center or emergency room at once. NOTE: This medicine is only for you. Do not share this medicine with others. What if I miss a dose? It is important not to miss a dose. Call your doctor or health care professional if you are unable to keep an appointment. What may interact with this medicine? -medicines for seizures -medicines to increase blood counts like filgrastim, pegfilgrastim, sargramostim -some antibiotics like amikacin, gentamicin, neomycin, streptomycin, tobramycin -vaccines Talk to your doctor or health care professional before taking any of these medicines: -acetaminophen -aspirin -ibuprofen -ketoprofen -naproxen This list may not describe all possible interactions. Give your health care provider a list of all the medicines, herbs, non-prescription drugs, or dietary supplements you use. Also tell them if you smoke, drink alcohol, or use illegal drugs. Some items may interact with your medicine. What should I watch for while using this medicine? Your condition will be monitored carefully while you are receiving this medicine. You will need important blood work done while you are taking this medicine. This drug may make you feel generally unwell. This is not uncommon, as chemotherapy can affect healthy cells as well as cancer cells. Report any side effects. Continue your course of treatment even though you feel ill unless your doctor  tells you to stop. In some cases, you may be given additional medicines to help with side effects. Follow all directions for their use. Call your doctor or health care professional for advice if you get a fever, chills or sore throat, or other symptoms of a cold or flu. Do not treat yourself. This drug decreases your body's ability to fight infections. Try to avoid being around people who are sick. This medicine may increase your risk to bruise or bleed. Call your doctor or health care professional if you notice any unusual bleeding. Be careful brushing and flossing your teeth or using a toothpick because you may get an infection or bleed more easily. If you have any dental work done, tell your dentist you are receiving this medicine. Avoid taking products that contain aspirin, acetaminophen, ibuprofen, naproxen, or ketoprofen unless instructed by your doctor. These medicines may hide a fever. Do not become pregnant while taking this medicine. Women should inform their doctor if they wish to become pregnant or think they might be pregnant. There is a potential for serious side effects to an unborn child. Talk to your health care professional or pharmacist for more information. Do not breast-feed an infant while taking this medicine. What side effects may I notice from receiving this medicine? Side effects that you should report to your doctor or health care professional as soon as possible: -allergic reactions like skin rash, itching or hives, swelling of the face, lips, or tongue -signs of infection - fever   or chills, cough, sore throat, pain or difficulty passing urine -signs of decreased platelets or bleeding - bruising, pinpoint red spots on the skin, black, tarry stools, nosebleeds -signs of decreased red blood cells - unusually weak or tired, fainting spells, lightheadedness -breathing problems -changes in hearing -changes in vision -chest pain -high blood pressure -low blood counts - This  drug may decrease the number of white blood cells, red blood cells and platelets. You may be at increased risk for infections and bleeding. -nausea and vomiting -pain, swelling, redness or irritation at the injection site -pain, tingling, numbness in the hands or feet -problems with balance, talking, walking -trouble passing urine or change in the amount of urine Side effects that usually do not require medical attention (report to your doctor or health care professional if they continue or are bothersome): -hair loss -loss of appetite -metallic taste in the mouth or changes in taste This list may not describe all possible side effects. Call your doctor for medical advice about side effects. You may report side effects to FDA at 1-800-FDA-1088. Where should I keep my medicine? This drug is given in a hospital or clinic and will not be stored at home. NOTE: This sheet is a summary. It may not cover all possible information. If you have questions about this medicine, talk to your doctor, pharmacist, or health care provider.  2015, [employer]/Gold Standard. (2007-11-29 14:38:05)   

## 2014-06-11 NOTE — Progress Notes (Signed)
Nutrition followup completed with patient during chemotherapy.  Weight has declined and was documented as 244.4 pounds on October 5.  Patient continues to try to eat soft blenderize foods.  He has tried some oral nutrition supplements.  He states he is meeting about "60%" of my recommendations.  Nutrition diagnosis: Inadequate oral intake continues.  Intervention: Enforced importance of continuing high-calorie, high-protein meals and snacks. Encouraged patient to continue oral nutrition supplements 3-4 daily. Questions were answered.  Teach back method used.  Monitoring, evaluation, goals: Patient will tolerate adequate calories and protein to minimize weight loss.  Next visit: Monday, October 12, during chemotherapy.  **Disclaimer: This note was dictated with voice recognition software. Similar sounding words can inadvertently be transcribed and this note may contain transcription errors which may not have been corrected upon publication of note.**

## 2014-06-11 NOTE — Telephone Encounter (Signed)
gv pt appt schedule for oct/nov °

## 2014-06-11 NOTE — Progress Notes (Signed)
   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  CBCT/MVCT images/Port film x-rays were reviewed.  The chart was checked. No new complaints  Physical Findings:  height is 5\' 9"  (1.753 m) and weight is 244 lb 11.2 oz (110.995 kg).  NAD, well appearing  CBC    Component Value Date/Time   WBC 6.3 05/31/2014 1359   RBC 4.73 05/31/2014 1359   HGB 13.7 05/31/2014 1359   HCT 41.5 05/31/2014 1359   PLT 165 05/31/2014 1359   MCV 87.7 05/31/2014 1359   MCH 29.0 05/31/2014 1359   MCHC 33.0 05/31/2014 1359   RDW 14.2 05/31/2014 1359   LYMPHSABS 1.3 05/31/2014 1359   MONOABS 0.5 05/31/2014 1359   EOSABS 0.1 05/31/2014 1359   BASOSABS 0.0 05/31/2014 1359     CMP     Component Value Date/Time   NA 141 05/31/2014 1359   K 4.5 05/31/2014 1359   CO2 28 05/31/2014 1359   GLUCOSE 79 05/31/2014 1359   BUN 10.6 05/31/2014 1359   CREATININE 1.1 05/31/2014 1359   CALCIUM 10.0 05/31/2014 1359   PROT 6.7 05/31/2014 1359   ALBUMIN 3.8 05/31/2014 1359   AST 17 05/31/2014 1359   ALT 16 05/31/2014 1359   ALKPHOS 61 05/31/2014 1359   BILITOT 0.43 05/31/2014 1359     Impression:  The patient is tolerating radiotherapy.   Plan:  Continue radiotherapy as planned.  Went over PET imaging.  I am treating with fields that are long enough to cover all gross disease with some margin while strategically minimizing overlap with normal tissues: Goal is to minimize side effects and maintain fitness for surgery.  -----------------------------------  Eppie Gibson, MD

## 2014-06-12 ENCOUNTER — Ambulatory Visit
Admission: RE | Admit: 2014-06-12 | Discharge: 2014-06-12 | Disposition: A | Discharge status: Home / Self Care | Payer: BC Managed Care – PPO | Source: Ambulatory Visit | Attending: Radiation Oncology | Admitting: Radiation Oncology

## 2014-06-12 ENCOUNTER — Telehealth: Payer: Self-pay | Admitting: *Deleted

## 2014-06-12 DIAGNOSIS — Z510 Encounter for antineoplastic radiation therapy: Secondary | ICD-10-CM | POA: Diagnosis not present

## 2014-06-12 NOTE — Telephone Encounter (Signed)
Message copied by Cherylynn Ridges on Tue Jun 12, 2014 10:57 AM ------      Message from: Kellie Simmering A      Created: Mon Jun 11, 2014  4:20 PM      Regarding: 1st treatment       1st treatment Paclitaxel/Carboplatin. Dr. Benay Spice.             [phone] Dala Dock (W)      [phone] (M) ------

## 2014-06-12 NOTE — Telephone Encounter (Signed)
Called [facility] for chemotherapy F/U.  Patient is doing well.  He is at work today.  "I can feel the effects of it but I feel good."  Denies n/v.  I already had trouble swallowing and my mouth feels the same with no change in swallowing.  Denies any new side effects or symptoms.  Bowel and bladder is functioning well.  Eating and drinking well and I instructed to drink 64 oz minimum daily or at least the day before, of and after treatment.  Denies questions at this time and encouraged to call if needed.  Reviewed how to call after hours in the case of an emergency.

## 2014-06-13 ENCOUNTER — Ambulatory Visit
Admission: RE | Admit: 2014-06-13 | Discharge: 2014-06-13 | Disposition: A | Discharge status: Home / Self Care | Payer: BC Managed Care – PPO | Source: Ambulatory Visit | Attending: Radiation Oncology | Admitting: Radiation Oncology

## 2014-06-13 DIAGNOSIS — Z510 Encounter for antineoplastic radiation therapy: Secondary | ICD-10-CM | POA: Diagnosis not present

## 2014-06-14 ENCOUNTER — Encounter: Payer: Self-pay | Admitting: Cardiothoracic Surgery

## 2014-06-14 ENCOUNTER — Institutional Professional Consult (permissible substitution) (INDEPENDENT_AMBULATORY_CARE_PROVIDER_SITE_OTHER): Payer: BC Managed Care – PPO | Admitting: Cardiothoracic Surgery

## 2014-06-14 ENCOUNTER — Ambulatory Visit
Admission: RE | Admit: 2014-06-14 | Discharge: 2014-06-14 | Disposition: A | Discharge status: Home / Self Care | Payer: BC Managed Care – PPO | Source: Ambulatory Visit | Attending: Radiation Oncology | Admitting: Radiation Oncology

## 2014-06-14 VITALS — BP 108/72 | HR 83 | Ht 69.0 in | Wt 244.0 lb

## 2014-06-14 DIAGNOSIS — C155 Malignant neoplasm of lower third of esophagus: Secondary | ICD-10-CM

## 2014-06-14 DIAGNOSIS — Z510 Encounter for antineoplastic radiation therapy: Secondary | ICD-10-CM | POA: Diagnosis not present

## 2014-06-14 NOTE — Progress Notes (Signed)
[address]       [place] [address]             [id number]                    Ewel J Pesch [facility]in Medical Record #[id number] Date of Birth: 1956-03-11  Referring: [name], MD Primary Care: [name], MD  Chief Complaint:    Chief Complaint  Patient presents with  . NEW THORACICI    ESOPHAGUS CANCER    History of Present Illness:    Joel Miller 58 y.o. male is seen in the office  today for esophageal cancer. Patient first noted weight loss of up to 40-50 pounds over the past 3 months. In June of 2015 he noted increasing difficulty swallowing. In September upper GI endoscopy was performed demonstrating esophageal carcinoma. The patient had no previous history of reflux, is a nonsmoker. Per report: "Starting at the gastric cardia and extending into the lower one third of the esophagus there was a circumferential, exophytic, friable tumor causing a long stricture. The 9 mm gastroscope easily traversed this area. Tumor was seen in clusters throughout the esophagus up to approximately 25 cm from the incisors." No endoscopic ultrasound has been performed  Currently the patient notes that he has a poor appetite, he is able to get a by mouth diet but is limited as to what he can eat comfortably. He is started chemotherapy and radiation earlier this week, tentatively to be completed by November 10.  Diagnosis Esophagus, biopsy - ADENOCARCINOMA, INTESTINAL TYPE. PLEASE SEE COMMENT. HER2/NEU BY CISH - SHOWS AMPLIFICATION BY CISH ANALYSIS. RESULT RATIO OF HER2: CEP 17 SIGNALS 3.97 AVERAGE HER2 COPY NUMBER PER CELL 7.55 REFERENCE RANGE NEGATIVE HER2/Chr17 Ratio <2.0 and Average HER2 copy number <4.0 EQUIVOCAL HER2/Chr17 Ratio <2.0 and Average HER2 copy number 4.0 and <6.0 POSITIVE HER2/Chr17 Ratio >=2.0 and/or Average HER2 copy number >=6.0 Current Activity/ Functional Status:  Patient is independent with mobility/ambulation,  transfers, ADL's, IADL's.   Zubrod Score: At the time of surgery this patient's most appropriate activity status/level should be described as: _0     0    Normal activity, no symptoms _1     1    Restricted in physical strenuous activity but ambulatory, able to do out light work _2     2    Ambulatory and capable of self care, unable to do work activities, up and about               >50 % of waking hours                              _3     3    Only limited self care, in bed greater than 50% of waking hours _4     4    Completely disabled, no self care, confined to bed or chair _5     5    Moribund   Past Medical History  Diagnosis Date  . Hypertension   . Hyperlipidemia   . Obesity   . Hypogonadism male   . Vitamin D deficiency   . 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 Allergy     HEY FEVER  . Diabetes mellitus without complication     type II  . GERD (gastroesophageal reflux disease)     Past Surgical History  Procedure Laterality Date  .  Colonoscopy    . Biospy of esphagus  05/18/14    Family History  Problem Relation Age of Onset  . Hypertension Mother   . Alzheimer's disease Mother   . Cancer Father 72    throat   patient has 3 children , one who works with epic at the oncology center , his father had a history of laryngeal cancer was a heavy smoker and ultimately succumbed to metastatic bowel cancer . His brother 2 years younger has history of prostate cancer   History   Social History  . Marital Status: Married    Spouse Name: N/A    Number of Children: 3  . Years of Education: N/A   Occupational History  . Sales Development worker, community    Social History Main Topics  . Smoking status: Current Some Day Smoker -- 1.00 packs/day for 3 years    Types: Cigars  . Smokeless tobacco: Never Used     Comment: has a cigar occasionally  . Alcohol Use: 1.0 oz/week    2 drink(s) per week     Comment: social  . Drug Use: No  . Sexual Activity: Not on file   Other  Topics Concern  . Not on file   Social History Narrative  . No narrative on file    History  Smoking status  . Current Some Day Smoker -- 1.00 packs/day for 3 years  . Types: Cigars  Smokeless tobacco  . Never Used    Comment: has a cigar occasionally    History  Alcohol Use  . 1.0 oz/week  . 2 drink(s) per week    Comment: social     Allergies  Allergen Reactions  . Ace Inhibitors     cough    Current Outpatient Prescriptions  Medication Sig Dispense Refill  . pantoprazole (PROTONIX) 40 MG tablet Take 1 tablet 2 x daily for Acid Indigestion & Reflux  60 tablet  99  . aspirin 81 MG tablet Take 81 mg by mouth daily.      . Cholecalciferol (VITAMIN D PO) Take 10,000 Units by mouth daily.      Marland Kitchen dexamethasone (DECADRON) 2 MG tablet Take 10 mg (5 Tablets) at 10 pm night before chemo treatment and again at 6am day of chemo treatment  10 tablet  0  . FREESTYLE LITE test strip CHECK BLOOD SUGAR ONCE DAILY  100 each  11  . losartan (COZAAR) 100 MG tablet Take 1 tablet (100 mg total) by mouth daily.  90 tablet  0  . metFORMIN (GLUCOPHAGE XR) 500 MG 24 hr tablet 2 pills twice daily with food  120 tablet  2  . naproxen sodium (ANAPROX) 220 MG tablet Take 220 mg by mouth as needed.      . prochlorperazine (COMPAZINE) 10 MG tablet Take 1 tablet (10 mg total) by mouth every 6 (six) hours as needed for nausea or vomiting.  30 tablet  1  . sucralfate (CARAFATE) 1 G tablet Take 1 tablet (1 g total) by mouth 4 (four) times daily -  with meals and at bedtime.  120 tablet  1  . testosterone (ANDROGEL) 50 MG/5GM GEL Place 5 g onto the skin daily. Apply 2 pumps daily  Dispense 1pump per month  1 Package  5  . zolpidem (AMBIEN CR) 12.5 MG CR tablet Take 12.5 mg by mouth at bedtime.        No current facility-administered medications for this visit.     Review of Systems:  Cardiac Review of Systems: Y or N  Chest Pain [ n   ]  Resting SOB [ n  ] Exertional SOB  [ n ]  Orthopnea [ n ]     Pedal Edema [ n  ]    Palpitations [n ] Syncope  [n  ]   Presyncope [ n  ]  General Review of Systems: [Y] = yes [  ]=no Constitional: recent weight change Blue.Reese  ];  Wt loss over the last 3 months [ 40-50  ] anorexia [ y ]; fatigue Blue.Reese  ]; nausea [  ]; night sweats [  ]; fever [  ]; or chills [  ];          Dental: poor dentition[n  ]; Last Dentist visit:   Eye : blurred vision [  ]; diplopia [   ]; vision changes [  ];  Amaurosis fugax[  ]; Resp: cough [  ];  wheezing[  ];  hemoptysis[  ]; shortness of breath[  ]; paroxysmal nocturnal dyspnea[  ]; dyspnea on exertion[  ]; or orthopnea[  ];  GI:  gallstones[n  ], vomiting[n  ];  dysphagia[ y ]; melena[  ];  hematochezia [  ]; heartburn[  ];   Hx of  Colonoscopy[y  ]; GU: kidney stones [  ]; hematuria[  ];   dysuria [  ];  nocturia[  ];  history of     obstruction [  ]; urinary frequency [  ]             Skin: rash, swelling[  ];, hair loss[  ];  peripheral edema[  ];  or itching[  ]; Musculosketetal: myalgias[  ];  joint swelling[  ];  joint erythema[  ];  joint pain[  ];  back pain[  ];  Heme/Lymph: bruising[  ];  bleeding[  ];  anemia[  ];  Neuro: TIA[  ];  headaches[  ];  stroke[  ];  vertigo[  ];  seizures[  ];   paresthesias[  ];  difficulty walking[  ];  Psych:depression[  ]; anxiety[  ];  Endocrine: diabetes[  ];  thyroid dysfunction[  ];  Immunizations: Flu up to date [ x ]; Pneumococcal up to date [ x ];  Other:  Physical Exam: BP 108/72  Pulse 83  Ht _0  (1.753 m)  Wt 244 lb (110.678 kg)  BMI 36.02 kg/m2  SpO2 94%  PHYSICAL EXAMINATION:  General appearance: alert, cooperative and appears older than stated age Neurologic: intact Heart: regular rate and rhythm, S1, S2 normal, no murmur, click, rub or gallop Lungs: clear to auscultation bilaterally Abdomen: soft, non-tender; bowel sounds normal; no masses,  no organomegaly Extremities: extremities normal, atraumatic, no cyanosis or edema and Homans sign is negative, no sign of  DVT Patient has no carotid bruits, full DP and PT pulses bilaterally Patient has no cervical supraclavicular or axillary adenopathy     Diagnostic Studies & Laboratory data:     Recent Radiology Findings:   Ct Chest W Contrast  05/22/2014   CLINICAL DATA:  Esophageal mass.  EXAM: CT CHEST, ABDOMEN, AND PELVIS WITH CONTRAST  TECHNIQUE: Multidetector CT imaging of the chest, abdomen and pelvis was performed following the standard protocol during bolus administration of intravenous contrast.  CONTRAST:  137m OMNIPAQUE IOHEXOL 300 MG/ML  SOLN  COMPARISON:  None.  FINDINGS: CT CHEST FINDINGS  No enlarged axillary, mediastinal or hilar lymphadenopathy. Heart is normal in size. No pericardial effusion. Aorta  and main pulmonary artery are normal in caliber. There is circumferential wall thickening of the distal esophagus (image 42; series 2). Multiple sub cm lymph nodes are demonstrated adjacent to the distal aspect of the esophagus.  Central airways are patent. Multiple bilateral pulmonary nodules are demonstrated including a 3 mm nodule within the left fissure (image 27; series 3); 3 mm right lower lobe nodule (image 33; series 3); 4 mm right lower lobe nodule (image 31; series 3); 5 mm right upper lobe nodule (image 29; series 3); 4 mm right upper lobe nodule (image 12; series 3). No pleural effusion or pneumothorax.  CT ABDOMEN AND PELVIS FINDINGS  Liver is normal in size and contour. Focal fatty deposition adjacent to the falciform ligament. Gallbladder is unremarkable. Portal vein is patent. There is a 1.4 cm porta hepatic lymph node (image 58; series 2). Spleen, pancreas and bilateral adrenal glands are unremarkable. Kidneys enhance symmetrically with contrast. No hydronephrosis.  Normal caliber abdominal aorta. Peripheral noncalcified atherosclerotic plaque. Fat containing right inguinal hernia. Urinary bladder is unremarkable. Central dystrophic calcifications within the prostate.  No abnormal bowel wall  thickening. No evidence for bowel obstruction. No free fluid or free intraperitoneal air. Normal appendix.  Upper abdominal mesenteric fat stranding. There is a 1.9 x 1.6 cm lymph node adjacent to the GE junction (image 51; series 2) additionally there is a 2.4 x 1.1 cm left periaortic lymph node (image 60; series 2). Multiple additional adjacent subcentimeter retroperitoneal lymph nodes are identified.  Sclerotic lesion within the right ischium (image 124; series 2). Degenerative changes of the lower lumbar spine. Probable chronic transverse process fractures of the left L3 and L4 transverse processes. No aggressive or acute appearing osseous lesions.  IMPRESSION: 1. Circumferential wall thickening of the distal esophagus compatible with esophageal mass/adenocarcinoma. 2. Enlarged lymph node adjacent to the distal esophagus at the GE junction concerning for metastatic adenopathy. Additionally there is a prominent porta hepatic lymph node and an enlarged left periaortic lymph node which may represent metastatic adenopathy. 3. Multiple bilateral pulmonary nodules. While these may represent sequelae of prior infectious/inflammatory process, metastatic disease is not excluded. 4. Nonspecific upper abdominal mesenteric fat stranding.   Electronically Signed   By: Lovey Newcomer M.D.   On: 05/22/2014 16:33   Nm Pet Image Initial (pi) Skull Base To Thigh  05/29/2014   CLINICAL DATA:  Initial treatment strategy for esophageal cancer of the distal third of the esophagus.  EXAM: NUCLEAR MEDICINE PET SKULL BASE TO THIGH  TECHNIQUE: 12.4 mCi F-18 FDG was injected intravenously. Full-ring PET imaging was performed from the skull base to thigh after the radiotracer. CT data was obtained and used for attenuation correction and anatomic localization.  FASTING BLOOD GLUCOSE:  Value: 104 mg/dl  COMPARISON:  CT of the chest, abdomen and pelvis 05/22/2014.  FINDINGS: NECK  No hypermetabolic lymph nodes in the neck.  CHEST  Profound  thickening of the distal third of the esophagus which appears to extend just beyond the level of the gastroesophageal junction, throughout which there is diffuse hypermetabolism (SUVmax = 16.9). More proximally in the esophagus there are 2 additional areas of soft tissue thickening and hypermetabolism, one on image 76 of series 4 (SUVmax = 7.9), and the other on image 66 of series 4 (SUVmax = 7.4). The intervening at esophagus appears normal between these 3 affected regions. No definite paraesophageal hypermetabolic lymphadenopathy. No hypermetabolic adenopathy within the other portions of the mediastinal or hilar regions. No suspicious pulmonary nodules or masses.  No airspace consolidation, and no pleural effusion.  ABDOMEN/PELVIS  As previously discussed, there is soft tissue thickening which extends from the distal third of the esophagus into the proximal aspect of the stomach. This area is diffusely hypermetabolic (SUVmax = 7.9). There is a mildly enlarged gastrohepatic ligament lymph node measuring 1.2 cm in short axis which is hypermetabolic (SUVmax = 5.9). No abnormal hypermetabolic activity within the liver, pancreas, adrenal glands, or spleen. No hypermetabolic lymph nodes in the abdomen or pelvis.  SKELETON  No focal hypermetabolic activity to suggest skeletal metastasis.  IMPRESSION: 1. Extensive hypermetabolic soft tissue thickening of the distal third of the esophagus extending into the proximal stomach, compatible with a primary esophageal neoplasm. There are two additional smaller areas of hypermetabolism in the more proximal esophagus (the highest of which is at the level of the aortic arch) which are also likely foci of tumor, and there is a mildly enlarged hypermetabolic gastrohepatic ligament lymph node, presumably metastatic. No distal metastatic disease otherwise known in the neck or pelvis.   Electronically Signed   By: Vinnie Langton M.D.   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 I agree with continuing with preoperative radiation and chemotherapy, then restaging the patient and if there is no evidence of metastatic disease consider him reasonable candidate for surgical resection. I discussed this with the patient in detail I will plan to see him back on November 12, at that time we will coordinate followup restaging studies.  Hypertension  Hyperlipidemia  T2 NIDDM  Testosterone Deficiency  Vitamin D deficiency  Malignant neoplasm of prostate  Severe obesity (BMI >= 40)  Encounter for long-term (current) use of other medications  GERD  Personal history of colonic polyps    I spent 55 minutes counseling the patient face to face. The total time spent in the appointment was 80 minutes.  [name] MD      [address] [facility] [place] [address] Office [phone]   Beeper [phone]  06/14/2014 4:46 PM

## 2014-06-15 ENCOUNTER — Ambulatory Visit
Admission: RE | Admit: 2014-06-15 | Discharge: 2014-06-15 | Disposition: A | Discharge status: Home / Self Care | Payer: BC Managed Care – PPO | Source: Ambulatory Visit | Attending: Radiation Oncology | Admitting: Radiation Oncology

## 2014-06-15 DIAGNOSIS — Z510 Encounter for antineoplastic radiation therapy: Secondary | ICD-10-CM | POA: Diagnosis not present

## 2014-06-17 ENCOUNTER — Other Ambulatory Visit: Payer: Self-pay | Admitting: Oncology

## 2014-06-18 ENCOUNTER — Encounter: Payer: Self-pay | Admitting: Radiation Oncology

## 2014-06-18 ENCOUNTER — Ambulatory Visit: Payer: BC Managed Care – PPO | Admitting: Nutrition

## 2014-06-18 ENCOUNTER — Ambulatory Visit
Admission: RE | Admit: 2014-06-18 | Discharge: 2014-06-18 | Disposition: A | Discharge status: Home / Self Care | Payer: BC Managed Care – PPO | Source: Ambulatory Visit | Attending: Radiation Oncology | Admitting: Radiation Oncology

## 2014-06-18 ENCOUNTER — Ambulatory Visit (HOSPITAL_BASED_OUTPATIENT_CLINIC_OR_DEPARTMENT_OTHER): Payer: BC Managed Care – PPO

## 2014-06-18 ENCOUNTER — Other Ambulatory Visit (HOSPITAL_BASED_OUTPATIENT_CLINIC_OR_DEPARTMENT_OTHER): Payer: BC Managed Care – PPO

## 2014-06-18 VITALS — BP 120/65 | HR 91 | Temp 97.9°F | Resp 20 | Wt 237.8 lb

## 2014-06-18 DIAGNOSIS — C155 Malignant neoplasm of lower third of esophagus: Secondary | ICD-10-CM

## 2014-06-18 DIAGNOSIS — Z510 Encounter for antineoplastic radiation therapy: Secondary | ICD-10-CM | POA: Insufficient documentation

## 2014-06-18 DIAGNOSIS — C160 Malignant neoplasm of cardia: Secondary | ICD-10-CM

## 2014-06-18 DIAGNOSIS — C61 Malignant neoplasm of prostate: Secondary | ICD-10-CM

## 2014-06-18 DIAGNOSIS — Z5111 Encounter for antineoplastic chemotherapy: Secondary | ICD-10-CM

## 2014-06-18 LAB — CBC WITH DIFFERENTIAL/PLATELET
BASO%: 0.4 % (ref 0.0–2.0)
Basophils Absolute: 0 10*3/uL (ref 0.0–0.1)
EOS ABS: 0.1 10*3/uL (ref 0.0–0.5)
EOS%: 1.2 % (ref 0.0–7.0)
HCT: 42.1 % (ref 38.4–49.9)
HGB: 14.4 g/dL (ref 13.0–17.1)
LYMPH%: 14.6 % (ref 14.0–49.0)
MCH: 29.4 pg (ref 27.2–33.4)
MCHC: 34.2 g/dL (ref 32.0–36.0)
MCV: 86.1 fL (ref 79.3–98.0)
MONO#: 0.3 10*3/uL (ref 0.1–0.9)
MONO%: 5.1 % (ref 0.0–14.0)
NEUT#: 3.9 10*3/uL (ref 1.5–6.5)
NEUT%: 78.7 % — ABNORMAL HIGH (ref 39.0–75.0)
Platelets: 174 10*3/uL (ref 140–400)
RBC: 4.89 10*6/uL (ref 4.20–5.82)
RDW: 13.7 % (ref 11.0–14.6)
WBC: 4.9 10*3/uL (ref 4.0–10.3)
lymph#: 0.7 10*3/uL — ABNORMAL LOW (ref 0.9–3.3)
nRBC: 0 % (ref 0–0)

## 2014-06-18 LAB — COMPREHENSIVE METABOLIC PANEL (CC13)
ALBUMIN: 3.8 g/dL (ref 3.5–5.0)
ALT: 17 U/L (ref 0–55)
ANION GAP: 10 meq/L (ref 3–11)
AST: 20 U/L (ref 5–34)
Alkaline Phosphatase: 51 U/L (ref 40–150)
BUN: 21.8 mg/dL (ref 7.0–26.0)
CALCIUM: 9.8 mg/dL (ref 8.4–10.4)
CHLORIDE: 105 meq/L (ref 98–109)
CO2: 22 meq/L (ref 22–29)
CREATININE: 0.9 mg/dL (ref 0.7–1.3)
Glucose: 93 mg/dl (ref 70–140)
POTASSIUM: 4.6 meq/L (ref 3.5–5.1)
Sodium: 137 mEq/L (ref 136–145)
Total Bilirubin: 0.67 mg/dL (ref 0.20–1.20)
Total Protein: 7.1 g/dL (ref 6.4–8.3)

## 2014-06-18 MED ORDER — DEXAMETHASONE SODIUM PHOSPHATE 10 MG/ML IJ SOLN
INTRAMUSCULAR | Status: AC
  Filled 2014-06-18: qty 1

## 2014-06-18 MED ORDER — DIPHENHYDRAMINE HCL 50 MG/ML IJ SOLN
25.0000 mg | Freq: Once | INTRAMUSCULAR | Status: AC
  Administered 2014-06-18: 25 mg via INTRAVENOUS

## 2014-06-18 MED ORDER — SODIUM CHLORIDE 0.9 % IV SOLN
250.0000 mg | Freq: Once | INTRAVENOUS | Status: AC
  Administered 2014-06-18: 250 mg via INTRAVENOUS
  Filled 2014-06-18: qty 25

## 2014-06-18 MED ORDER — DIPHENHYDRAMINE HCL 50 MG/ML IJ SOLN
INTRAMUSCULAR | Status: AC
  Filled 2014-06-18: qty 1

## 2014-06-18 MED ORDER — SODIUM CHLORIDE 0.9 % IV SOLN
Freq: Once | INTRAVENOUS | Status: AC
  Administered 2014-06-18: 11:00:00 via INTRAVENOUS

## 2014-06-18 MED ORDER — FAMOTIDINE IN NACL 20-0.9 MG/50ML-% IV SOLN
20.0000 mg | Freq: Once | INTRAVENOUS | Status: AC
  Administered 2014-06-18: 20 mg via INTRAVENOUS

## 2014-06-18 MED ORDER — FAMOTIDINE IN NACL 20-0.9 MG/50ML-% IV SOLN
INTRAVENOUS | Status: AC
  Filled 2014-06-18: qty 50

## 2014-06-18 MED ORDER — DEXAMETHASONE SODIUM PHOSPHATE 10 MG/ML IJ SOLN
10.0000 mg | Freq: Once | INTRAMUSCULAR | Status: AC
  Administered 2014-06-18: 10 mg via INTRAVENOUS

## 2014-06-18 MED ORDER — BIAFINE EX EMUL
Freq: Every day | CUTANEOUS | Status: DC
  Administered 2014-06-18: 09:00:00 via TOPICAL

## 2014-06-18 MED ORDER — DEXTROSE 5 % IV SOLN
50.0000 mg/m2 | Freq: Once | INTRAVENOUS | Status: AC
  Administered 2014-06-18: 120 mg via INTRAVENOUS
  Filled 2014-06-18: qty 20

## 2014-06-18 MED ORDER — ONDANSETRON 16 MG/50ML IVPB (CHCC)
16.0000 mg | Freq: Once | INTRAVENOUS | Status: AC
  Administered 2014-06-18: 16 mg via INTRAVENOUS

## 2014-06-18 MED ORDER — ONDANSETRON 16 MG/50ML IVPB (CHCC)
INTRAVENOUS | Status: AC
  Filled 2014-06-18: qty 16

## 2014-06-18 NOTE — Progress Notes (Signed)
Pt weekly rad txs esophagus 6 completed,  Patient education done, biafine given discussed side effects, skin irritation, throat changes, diff swallowing, ,[place], , pain, may need to eat smaller meals 5-6, instead of 3 bg ones, increase protein in diet,. Patient already doing this stated, to start using biafine crea once skin becomes irritated or itching daily after rad txs,all questions answered, teach back, patient showed carafate tabs, said were not dissolving with 53ml, asked if this was right rx, yes infomred patient, if he wanted the slurry it would cost more monies 8:35 AM

## 2014-06-18 NOTE — Progress Notes (Signed)
Nutrition followup completed with patient during chemotherapy.   Patient's weight continues to decline and was documented as 237.8 pounds October 12, down from 244 pounds October 8.   atient continues to work to increase oral intake.  He states he is unable to tolerate carbohydrate, such as bread and pastas.   He has not tried blenderized foods yet.   He states his goal is to drink 3 boost plus and 3 milkshakes, and 3 yogurts daily.   He has purchased Unjury nutrition supplements for additional protein.  Nutrition diagnosis: Inadequate oral intake continues.  Intervention: Patient educated to work to increase overall calories and protein. Recommended patient continue minimum of 3 oral nutrition supplements daily.   Educated him on ways to add calories to foods he currently tolerates.   Provided patient with samples of Unjury protein powder. Briefly discussed nutrition trial using impact AR prior to upcoming surgery.  Monitoring, evaluation, goals: Patient will work to increase calories and protein to minimize further weight loss.  Next visit: Monday, October 19, during chemotherapy.  **Disclaimer: This note was dictated with voice recognition software. Similar sounding words can inadvertently be transcribed and this note may contain transcription errors which may not have been corrected upon publication of note.**

## 2014-06-18 NOTE — Patient Instructions (Signed)
[facility] Discharge Instructions for Patients Receiving Chemotherapy  Today you received the following chemotherapy agents: taxol, carboplatin   To help prevent nausea and vomiting after your treatment, we encourage you to take your nausea medication as prescribed.    If you develop nausea and vomiting that is not controlled by your nausea medication, call the clinic.   BELOW ARE SYMPTOMS THAT SHOULD BE REPORTED IMMEDIATELY:  *FEVER GREATER THAN 100.5 F  *CHILLS WITH OR WITHOUT FEVER  NAUSEA AND VOMITING THAT IS NOT CONTROLLED WITH YOUR NAUSEA MEDICATION  *UNUSUAL SHORTNESS OF BREATH  *UNUSUAL BRUISING OR BLEEDING  TENDERNESS IN MOUTH AND THROAT WITH OR WITHOUT PRESENCE OF ULCERS  *URINARY PROBLEMS  *BOWEL PROBLEMS  UNUSUAL RASH Items with * indicate a potential emergency and should be followed up as soon as possible.  Feel free to call the clinic you have any questions or concerns. The clinic phone number is [phone].    

## 2014-06-18 NOTE — Progress Notes (Signed)
Weekly Management Note:  Site: GE junction Current Dose:  1080  cGy Projected Dose: 4500  cGy  Narrative: The patient is seen today for routine under treatment assessment. CBCT/MVCT images/port films were reviewed. The chart was reviewed.   He is without complaints today. His Carafate slurry to use when necessary. I note that his weight is down 6 pounds over the past week.  Physical Examination:  Filed Vitals:   06/18/14 0828  BP: 120/65  Pulse: 91  Temp: 97.9 F (36.6 C)  Resp: 20  .  Weight: 237 lb 12.8 oz (107.865 kg). No change  Impression: Tolerating radiation therapy well. He needs to improve his caloric intake.  Plan: Continue radiation therapy as planned.

## 2014-06-19 ENCOUNTER — Ambulatory Visit
Admission: RE | Admit: 2014-06-19 | Discharge: 2014-06-19 | Disposition: A | Discharge status: Home / Self Care | Payer: BC Managed Care – PPO | Source: Ambulatory Visit | Attending: Radiation Oncology | Admitting: Radiation Oncology

## 2014-06-19 DIAGNOSIS — Z510 Encounter for antineoplastic radiation therapy: Secondary | ICD-10-CM | POA: Diagnosis not present

## 2014-06-20 ENCOUNTER — Ambulatory Visit
Admission: RE | Admit: 2014-06-20 | Discharge: 2014-06-20 | Disposition: A | Discharge status: Home / Self Care | Payer: BC Managed Care – PPO | Source: Ambulatory Visit | Attending: Radiation Oncology | Admitting: Radiation Oncology

## 2014-06-20 DIAGNOSIS — Z510 Encounter for antineoplastic radiation therapy: Secondary | ICD-10-CM | POA: Diagnosis not present

## 2014-06-21 ENCOUNTER — Ambulatory Visit
Admission: RE | Admit: 2014-06-21 | Discharge: 2014-06-21 | Disposition: A | Discharge status: Home / Self Care | Payer: BC Managed Care – PPO | Source: Ambulatory Visit | Attending: Radiation Oncology | Admitting: Radiation Oncology

## 2014-06-21 ENCOUNTER — Encounter: Payer: Self-pay | Admitting: Radiation Oncology

## 2014-06-21 DIAGNOSIS — Z510 Encounter for antineoplastic radiation therapy: Secondary | ICD-10-CM | POA: Diagnosis not present

## 2014-06-22 ENCOUNTER — Ambulatory Visit
Admission: RE | Admit: 2014-06-22 | Discharge: 2014-06-22 | Disposition: A | Discharge status: Home / Self Care | Payer: BC Managed Care – PPO | Source: Ambulatory Visit | Attending: Radiation Oncology | Admitting: Radiation Oncology

## 2014-06-22 DIAGNOSIS — Z510 Encounter for antineoplastic radiation therapy: Secondary | ICD-10-CM | POA: Diagnosis not present

## 2014-06-24 ENCOUNTER — Other Ambulatory Visit: Payer: Self-pay | Admitting: Oncology

## 2014-06-25 ENCOUNTER — Other Ambulatory Visit (HOSPITAL_BASED_OUTPATIENT_CLINIC_OR_DEPARTMENT_OTHER): Payer: BC Managed Care – PPO

## 2014-06-25 ENCOUNTER — Encounter: Payer: Self-pay | Admitting: Radiation Oncology

## 2014-06-25 ENCOUNTER — Telehealth: Payer: Self-pay | Admitting: Oncology

## 2014-06-25 ENCOUNTER — Ambulatory Visit
Admission: RE | Admit: 2014-06-25 | Discharge: 2014-06-25 | Disposition: A | Discharge status: Home / Self Care | Payer: BC Managed Care – PPO | Source: Ambulatory Visit | Attending: Radiation Oncology | Admitting: Radiation Oncology

## 2014-06-25 ENCOUNTER — Ambulatory Visit: Payer: BC Managed Care – PPO | Admitting: Nutrition

## 2014-06-25 ENCOUNTER — Ambulatory Visit (HOSPITAL_BASED_OUTPATIENT_CLINIC_OR_DEPARTMENT_OTHER): Payer: BC Managed Care – PPO

## 2014-06-25 ENCOUNTER — Ambulatory Visit (HOSPITAL_BASED_OUTPATIENT_CLINIC_OR_DEPARTMENT_OTHER): Payer: BC Managed Care – PPO | Admitting: Oncology

## 2014-06-25 ENCOUNTER — Ambulatory Visit: Payer: BC Managed Care – PPO

## 2014-06-25 VITALS — BP 122/76 | HR 74 | Temp 97.7°F | Resp 18 | Ht 69.0 in | Wt 232.6 lb

## 2014-06-25 DIAGNOSIS — E119 Type 2 diabetes mellitus without complications: Secondary | ICD-10-CM

## 2014-06-25 DIAGNOSIS — Z510 Encounter for antineoplastic radiation therapy: Secondary | ICD-10-CM | POA: Diagnosis not present

## 2014-06-25 DIAGNOSIS — Z5111 Encounter for antineoplastic chemotherapy: Secondary | ICD-10-CM

## 2014-06-25 DIAGNOSIS — C155 Malignant neoplasm of lower third of esophagus: Secondary | ICD-10-CM

## 2014-06-25 DIAGNOSIS — C160 Malignant neoplasm of cardia: Secondary | ICD-10-CM

## 2014-06-25 DIAGNOSIS — I10 Essential (primary) hypertension: Secondary | ICD-10-CM

## 2014-06-25 LAB — CBC WITH DIFFERENTIAL/PLATELET
BASO%: 0.9 % (ref 0.0–2.0)
Basophils Absolute: 0 10*3/uL (ref 0.0–0.1)
EOS%: 1.4 % (ref 0.0–7.0)
Eosinophils Absolute: 0 10*3/uL (ref 0.0–0.5)
HCT: 41.6 % (ref 38.4–49.9)
HGB: 13.7 g/dL (ref 13.0–17.1)
LYMPH#: 0.4 10*3/uL — AB (ref 0.9–3.3)
LYMPH%: 13.6 % — AB (ref 14.0–49.0)
MCH: 28.8 pg (ref 27.2–33.4)
MCHC: 32.9 g/dL (ref 32.0–36.0)
MCV: 87.5 fL (ref 79.3–98.0)
MONO#: 0.3 10*3/uL (ref 0.1–0.9)
MONO%: 9.4 % (ref 0.0–14.0)
NEUT#: 2.3 10*3/uL (ref 1.5–6.5)
NEUT%: 74.7 % (ref 39.0–75.0)
Platelets: 141 10*3/uL (ref 140–400)
RBC: 4.76 10*6/uL (ref 4.20–5.82)
RDW: 13.7 % (ref 11.0–14.6)
WBC: 3.1 10*3/uL — ABNORMAL LOW (ref 4.0–10.3)

## 2014-06-25 LAB — COMPREHENSIVE METABOLIC PANEL (CC13)
ALBUMIN: 3.6 g/dL (ref 3.5–5.0)
ALT: 22 U/L (ref 0–55)
ANION GAP: 8 meq/L (ref 3–11)
AST: 19 U/L (ref 5–34)
Alkaline Phosphatase: 48 U/L (ref 40–150)
BUN: 11.9 mg/dL (ref 7.0–26.0)
CO2: 27 meq/L (ref 22–29)
Calcium: 9.7 mg/dL (ref 8.4–10.4)
Chloride: 106 mEq/L (ref 98–109)
Creatinine: 0.9 mg/dL (ref 0.7–1.3)
GLUCOSE: 118 mg/dL (ref 70–140)
POTASSIUM: 4.2 meq/L (ref 3.5–5.1)
SODIUM: 141 meq/L (ref 136–145)
Total Bilirubin: 0.5 mg/dL (ref 0.20–1.20)
Total Protein: 6.3 g/dL — ABNORMAL LOW (ref 6.4–8.3)

## 2014-06-25 MED ORDER — PACLITAXEL CHEMO INJECTION 300 MG/50ML
50.0000 mg/m2 | Freq: Once | INTRAVENOUS | Status: AC
  Administered 2014-06-25: 120 mg via INTRAVENOUS
  Filled 2014-06-25: qty 20

## 2014-06-25 MED ORDER — FAMOTIDINE IN NACL 20-0.9 MG/50ML-% IV SOLN
20.0000 mg | Freq: Once | INTRAVENOUS | Status: AC
  Administered 2014-06-25: 20 mg via INTRAVENOUS

## 2014-06-25 MED ORDER — ONDANSETRON 16 MG/50ML IVPB (CHCC)
INTRAVENOUS | Status: AC
  Filled 2014-06-25: qty 16

## 2014-06-25 MED ORDER — SODIUM CHLORIDE 0.9 % IV SOLN
Freq: Once | INTRAVENOUS | Status: AC
  Administered 2014-06-25: 10:00:00 via INTRAVENOUS

## 2014-06-25 MED ORDER — SODIUM CHLORIDE 0.9 % IV SOLN
250.0000 mg | Freq: Once | INTRAVENOUS | Status: AC
  Administered 2014-06-25: 250 mg via INTRAVENOUS
  Filled 2014-06-25: qty 25

## 2014-06-25 MED ORDER — DIPHENHYDRAMINE HCL 50 MG/ML IJ SOLN
25.0000 mg | Freq: Once | INTRAMUSCULAR | Status: AC
  Administered 2014-06-25: 25 mg via INTRAVENOUS

## 2014-06-25 MED ORDER — DIPHENHYDRAMINE HCL 50 MG/ML IJ SOLN
INTRAMUSCULAR | Status: AC
  Filled 2014-06-25: qty 1

## 2014-06-25 MED ORDER — DEXAMETHASONE SODIUM PHOSPHATE 10 MG/ML IJ SOLN
10.0000 mg | Freq: Once | INTRAMUSCULAR | Status: AC
  Administered 2014-06-25: 10 mg via INTRAVENOUS

## 2014-06-25 MED ORDER — ONDANSETRON 16 MG/50ML IVPB (CHCC)
16.0000 mg | Freq: Once | INTRAVENOUS | Status: AC
  Administered 2014-06-25: 16 mg via INTRAVENOUS

## 2014-06-25 MED ORDER — DEXAMETHASONE SODIUM PHOSPHATE 10 MG/ML IJ SOLN
INTRAMUSCULAR | Status: AC
  Filled 2014-06-25: qty 1

## 2014-06-25 MED ORDER — FAMOTIDINE IN NACL 20-0.9 MG/50ML-% IV SOLN
INTRAVENOUS | Status: AC
  Filled 2014-06-25: qty 50

## 2014-06-25 NOTE — Progress Notes (Signed)
[employer] Claim form rec'd from physician.  Fax claim form with pathology and itemized statement to [phone].  Original returned to patient and copy made for scanning.

## 2014-06-25 NOTE — Progress Notes (Signed)
   Weekly Management Note:  outpatient    ICD-9-CM ICD-10-CM  1. Carcinoma of distal third of esophagus 150.5 C15.5    Current Dose:  19.8 Gy  Projected Dose: 50.4 Gy   Narrative:  The patient presents for routine under treatment assessment.  CBCT/MVCT images/Port film x-rays were reviewed.  The chart was checked. He went upstairs before being seen in nursing today. I went upstairs and met with him in chemo infusion. Main complaint - more hiccups when eating. This was an issue prior to ChRT. But, this is managed but swallowing small amounts at a time. Liquid diet.  Overall, feels he is doing well.  Physical Findings:  vitals were not taken for this visit. NAD, well appearing.  CBC    Component Value Date/Time   WBC 3.1* 06/25/2014 0857   RBC 4.76 06/25/2014 0857   HGB 13.7 06/25/2014 0857   HCT 41.6 06/25/2014 0857   PLT 141 06/25/2014 0857   MCV 87.5 06/25/2014 0857   MCH 28.8 06/25/2014 0857   MCHC 32.9 06/25/2014 0857   RDW 13.7 06/25/2014 0857   LYMPHSABS 0.4* 06/25/2014 0857   MONOABS 0.3 06/25/2014 0857   EOSABS 0.0 06/25/2014 0857   BASOSABS 0.0 06/25/2014 0857     CMP     Component Value Date/Time   NA 141 06/25/2014 0857   K 4.2 06/25/2014 0857   CO2 27 06/25/2014 0857   GLUCOSE 118 06/25/2014 0857   BUN 11.9 06/25/2014 0857   CREATININE 0.9 06/25/2014 0857   CALCIUM 9.7 06/25/2014 0857   PROT 6.3* 06/25/2014 0857   ALBUMIN 3.6 06/25/2014 0857   AST 19 06/25/2014 0857   ALT 22 06/25/2014 0857   ALKPHOS 48 06/25/2014 0857   BILITOT 0.50 06/25/2014 0857     Impression:  The patient is tolerating radiotherapy.   Plan:  Continue radiotherapy as planned.    -----------------------------------  Eppie Gibson, MD

## 2014-06-25 NOTE — Progress Notes (Signed)
  [facility] OFFICE PROGRESS NOTE   Diagnosis: Esophagus cancer  INTERVAL HISTORY:   He has completed 2 weeks of Taxol/carboplatin chemotherapy and concurrent radiation. He has nausea in the evenings, relieved with Compazine. The solid dysphagia has improved. He has increased hiccups. No neuropathy symptoms. No odynophagia.  Objective:  Vital signs in last 24 hours:  Blood pressure 122/76, pulse 74, temperature 97.7 F (36.5 C), temperature source Oral, resp. rate 18, height $RemoveBe'5\' 9"'iXJyAtfoS$  (1.753 m), weight 232 lb 9.6 oz (105.507 kg).    HEENT: No thrush or ulcers Resp: Lungs clear bilaterally Cardio: Regular rate and rhythm GI: No hepatomegaly, nontender Vascular: No leg edema   Lab Results:  Lab Results  Component Value Date   WBC 3.1* 06/25/2014   HGB 13.7 06/25/2014   HCT 41.6 06/25/2014   MCV 87.5 06/25/2014   PLT 141 06/25/2014   NEUTROABS 2.3 06/25/2014    Lab Results  Component Value Date   CEA <0.5 05/31/2014    Medications: I have reviewed the patient's current medications.  Assessment/Plan: 1. Adenocarcinoma the esophagus/GE junction-status post an endoscopy 05/18/2014 confirming a gastric cardia/lower esophageal mass with tumor extending proximally in the esophagus to 25 cm from the incisors HER-2/neu amplified  Staging CT scan 05/22/2014 with indeterminate bilateral pulmonary nodules; and prominent paraesophageal, porta hepatis, and para-aortic lymph nodes  Staging PET scan 05/29/2014 confirmed hypermetabolic soft tissue thickening at the distal third of the esophagus extending into the proximal stomach. 2 additional smaller areas of hypermetabolism or noted in the more proximal esophagus with a mildly enlarged hypermetabolic gastrohepatic node.  Initiation of radiation and concurrent Taxol/carboplatin 06/11/2014 2. anorexia/weight loss  3. solid dysphagia  4. Diabetes  5. Hypertension  6. Hyperlipidemia  7. back pain relieved with naproxen     Disposition:  Mr. Sjogren appears to be tolerating the treatment well. The plan is to continue weekly Taxol/carboplatin and concurrent radiation. He will return for an office visit in 2 weeks. He will contact us in the interim for new symptoms. He saw Dr. Servando Snare and will undergo restaging at completion of chemotherapy/radiation.    Betsy Coder, MD  06/25/2014  2:08 PM

## 2014-06-25 NOTE — Progress Notes (Signed)
Nutrition followup completed with patient during chemotherapy.  He is being treated for distal gastroesophageal cancer.  He is receiving concurrent chemoradiation therapy. Patient's weight continues to decline and was documented as 232.6 pounds October 19, down from 237.8 pounds October 12. (7% wt loss in one month). Patient is not concerned with weight loss.  Patient expresses he is pleased.  He reports he feels well.  He states his labs are fine.  Patient consuming mostly liquids at this time.  He has been consuming smoothies that are lower in calories.  He does his drink Unjury nutrition protein supplements at least once a day.  Nutrition diagnosis: Inadequate oral intake continues.  Intervention: Recommended patient work to increase overall calories and protein to approximately 2600 calories, 130 g protein, daily. Educated patient on oral nutrition supplements to achieve caloric goals. Provided patient with additional high calorie supplements.  Patient to increase Unjury protein powder twice a day. Questions were answered.  Teach back method used.  Monitoring, evaluation, goals: Patient will work to increase calories and protein to minimize further weight loss.  Next visit: Monday, October 26, during chemotherapy.  **Disclaimer: This note was dictated with voice recognition software. Similar sounding words can inadvertently be transcribed and this note may contain transcription errors which may not have been corrected upon publication of note.**

## 2014-06-25 NOTE — Patient Instructions (Signed)
[facility] Discharge Instructions for Patients Receiving Chemotherapy  Today you received the following chemotherapy agents: Taxol and Carboplatin.  To help prevent nausea and vomiting after your treatment, we encourage you to take your nausea medication as prescribed.   If you develop nausea and vomiting that is not controlled by your nausea medication, call the clinic.   BELOW ARE SYMPTOMS THAT SHOULD BE REPORTED IMMEDIATELY:  *FEVER GREATER THAN 100.5 F  *CHILLS WITH OR WITHOUT FEVER  NAUSEA AND VOMITING THAT IS NOT CONTROLLED WITH YOUR NAUSEA MEDICATION  *UNUSUAL SHORTNESS OF BREATH  *UNUSUAL BRUISING OR BLEEDING  TENDERNESS IN MOUTH AND THROAT WITH OR WITHOUT PRESENCE OF ULCERS  *URINARY PROBLEMS  *BOWEL PROBLEMS  UNUSUAL RASH Items with * indicate a potential emergency and should be followed up as soon as possible.  Feel free to call the clinic you have any questions or concerns. The clinic phone number is [phone].    

## 2014-06-25 NOTE — Telephone Encounter (Signed)
gva dn printed appt sched and avs for pt for OCT and NOV    °

## 2014-06-26 ENCOUNTER — Ambulatory Visit
Admission: RE | Admit: 2014-06-26 | Discharge: 2014-06-26 | Disposition: A | Discharge status: Home / Self Care | Payer: BC Managed Care – PPO | Source: Ambulatory Visit | Attending: Radiation Oncology | Admitting: Radiation Oncology

## 2014-06-26 DIAGNOSIS — Z510 Encounter for antineoplastic radiation therapy: Secondary | ICD-10-CM | POA: Diagnosis not present

## 2014-06-27 ENCOUNTER — Ambulatory Visit
Admission: RE | Admit: 2014-06-27 | Discharge: 2014-06-27 | Disposition: A | Discharge status: Home / Self Care | Payer: BC Managed Care – PPO | Source: Ambulatory Visit | Attending: Radiation Oncology | Admitting: Radiation Oncology

## 2014-06-27 ENCOUNTER — Other Ambulatory Visit: Payer: Self-pay | Admitting: Oncology

## 2014-06-27 DIAGNOSIS — C61 Malignant neoplasm of prostate: Secondary | ICD-10-CM

## 2014-06-27 DIAGNOSIS — C155 Malignant neoplasm of lower third of esophagus: Secondary | ICD-10-CM

## 2014-06-27 DIAGNOSIS — Z510 Encounter for antineoplastic radiation therapy: Secondary | ICD-10-CM | POA: Diagnosis not present

## 2014-06-28 ENCOUNTER — Ambulatory Visit
Admission: RE | Admit: 2014-06-28 | Discharge: 2014-06-28 | Disposition: A | Discharge status: Home / Self Care | Payer: BC Managed Care – PPO | Source: Ambulatory Visit | Attending: Radiation Oncology | Admitting: Radiation Oncology

## 2014-06-28 DIAGNOSIS — Z510 Encounter for antineoplastic radiation therapy: Secondary | ICD-10-CM | POA: Diagnosis not present

## 2014-06-29 ENCOUNTER — Ambulatory Visit
Admission: RE | Admit: 2014-06-29 | Discharge: 2014-06-29 | Disposition: A | Discharge status: Home / Self Care | Payer: BC Managed Care – PPO | Source: Ambulatory Visit | Attending: Radiation Oncology | Admitting: Radiation Oncology

## 2014-06-29 DIAGNOSIS — Z510 Encounter for antineoplastic radiation therapy: Secondary | ICD-10-CM | POA: Diagnosis not present

## 2014-07-01 ENCOUNTER — Other Ambulatory Visit: Payer: Self-pay | Admitting: Oncology

## 2014-07-02 ENCOUNTER — Telehealth: Payer: Self-pay | Admitting: *Deleted

## 2014-07-02 ENCOUNTER — Ambulatory Visit (HOSPITAL_BASED_OUTPATIENT_CLINIC_OR_DEPARTMENT_OTHER): Payer: BC Managed Care – PPO

## 2014-07-02 ENCOUNTER — Other Ambulatory Visit: Payer: Self-pay | Admitting: *Deleted

## 2014-07-02 ENCOUNTER — Ambulatory Visit
Admission: RE | Admit: 2014-07-02 | Discharge: 2014-07-02 | Disposition: A | Discharge status: Home / Self Care | Payer: BC Managed Care – PPO | Source: Ambulatory Visit | Attending: Radiation Oncology | Admitting: Radiation Oncology

## 2014-07-02 ENCOUNTER — Ambulatory Visit: Payer: BC Managed Care – PPO | Admitting: Nutrition

## 2014-07-02 VITALS — BP 106/66 | HR 91 | Temp 97.6°F | Resp 12 | Wt 228.7 lb

## 2014-07-02 VITALS — BP 116/75 | HR 71 | Temp 98.3°F

## 2014-07-02 DIAGNOSIS — C160 Malignant neoplasm of cardia: Secondary | ICD-10-CM

## 2014-07-02 DIAGNOSIS — Z510 Encounter for antineoplastic radiation therapy: Secondary | ICD-10-CM | POA: Diagnosis not present

## 2014-07-02 DIAGNOSIS — C155 Malignant neoplasm of lower third of esophagus: Secondary | ICD-10-CM

## 2014-07-02 DIAGNOSIS — Z5111 Encounter for antineoplastic chemotherapy: Secondary | ICD-10-CM

## 2014-07-02 LAB — CBC WITH DIFFERENTIAL/PLATELET
BASO%: 0.6 % (ref 0.0–2.0)
Basophils Absolute: 0 10*3/uL (ref 0.0–0.1)
EOS%: 0.9 % (ref 0.0–7.0)
Eosinophils Absolute: 0 10*3/uL (ref 0.0–0.5)
HCT: 39.4 % (ref 38.4–49.9)
HGB: 13 g/dL (ref 13.0–17.1)
LYMPH#: 0.3 10*3/uL — AB (ref 0.9–3.3)
LYMPH%: 9.2 % — ABNORMAL LOW (ref 14.0–49.0)
MCH: 28.9 pg (ref 27.2–33.4)
MCHC: 32.9 g/dL (ref 32.0–36.0)
MCV: 87.6 fL (ref 79.3–98.0)
MONO#: 0.4 10*3/uL (ref 0.1–0.9)
MONO%: 10.7 % (ref 0.0–14.0)
NEUT#: 2.8 10*3/uL (ref 1.5–6.5)
NEUT%: 78.6 % — ABNORMAL HIGH (ref 39.0–75.0)
Platelets: 180 10*3/uL (ref 140–400)
RBC: 4.5 10*6/uL (ref 4.20–5.82)
RDW: 13.6 % (ref 11.0–14.6)
WBC: 3.6 10*3/uL — ABNORMAL LOW (ref 4.0–10.3)

## 2014-07-02 MED ORDER — ONDANSETRON 16 MG/50ML IVPB (CHCC)
INTRAVENOUS | Status: AC
  Filled 2014-07-02: qty 16

## 2014-07-02 MED ORDER — FAMOTIDINE IN NACL 20-0.9 MG/50ML-% IV SOLN
INTRAVENOUS | Status: AC
  Filled 2014-07-02: qty 50

## 2014-07-02 MED ORDER — DEXTROSE 5 % IV SOLN
50.0000 mg/m2 | Freq: Once | INTRAVENOUS | Status: AC
  Administered 2014-07-02: 120 mg via INTRAVENOUS
  Filled 2014-07-02: qty 20

## 2014-07-02 MED ORDER — DIPHENHYDRAMINE HCL 50 MG/ML IJ SOLN
INTRAMUSCULAR | Status: AC
  Filled 2014-07-02: qty 1

## 2014-07-02 MED ORDER — CARBOPLATIN CHEMO INJECTION 450 MG/45ML
250.0000 mg | Freq: Once | INTRAVENOUS | Status: AC
  Administered 2014-07-02: 250 mg via INTRAVENOUS
  Filled 2014-07-02: qty 25

## 2014-07-02 MED ORDER — ONDANSETRON 16 MG/50ML IVPB (CHCC)
16.0000 mg | Freq: Once | INTRAVENOUS | Status: AC
  Administered 2014-07-02: 16 mg via INTRAVENOUS

## 2014-07-02 MED ORDER — DIPHENHYDRAMINE HCL 50 MG/ML IJ SOLN
25.0000 mg | Freq: Once | INTRAMUSCULAR | Status: AC
  Administered 2014-07-02: 25 mg via INTRAVENOUS

## 2014-07-02 MED ORDER — SODIUM CHLORIDE 0.9 % IV SOLN
Freq: Once | INTRAVENOUS | Status: AC
  Administered 2014-07-02: 10:00:00 via INTRAVENOUS

## 2014-07-02 MED ORDER — FAMOTIDINE IN NACL 20-0.9 MG/50ML-% IV SOLN
20.0000 mg | Freq: Once | INTRAVENOUS | Status: AC
  Administered 2014-07-02: 20 mg via INTRAVENOUS

## 2014-07-02 MED ORDER — LIDOCAINE VISCOUS 2 % MT SOLN: OROMUCOSAL | Status: DC

## 2014-07-02 MED ORDER — DEXAMETHASONE SODIUM PHOSPHATE 10 MG/ML IJ SOLN
10.0000 mg | Freq: Once | INTRAMUSCULAR | Status: AC
  Administered 2014-07-02: 10 mg via INTRAVENOUS

## 2014-07-02 MED ORDER — DEXAMETHASONE SODIUM PHOSPHATE 10 MG/ML IJ SOLN
INTRAMUSCULAR | Status: AC
  Filled 2014-07-02: qty 1

## 2014-07-02 NOTE — Progress Notes (Signed)
He is currently in no pain. Pt complains of, Loss of Sleep, Fatigue, Generalized Weakness and Poor Appetite.  Pt presenting appropriate quality, quantity and organization of sentences. Pt has had dysphagia for liquids. PO Diet: soft, boost 1-3 cans a day. Oral exam reveals mucous membranes moist, pharynx normal without lesions, noted a small amount of white exudate on tongue. Skin warm dry and intact. Pt reports they have Biafine but hasn't used it yet.

## 2014-07-02 NOTE — Progress Notes (Signed)
Nutrition followup completed with patient during chemotherapy.  He is being treated for distal gastroesophageal cancer.   Patient's weight continues to decline and was documented as 228.7 pounds October 26 down from 232.6 pounds October 19. Patient reports he is trying to eat more soft solids.  He is pleased with his weight loss.  He continues to drink Unjur[employer] Nutrition protein supplements whenever he can add it to something.    Nutrition diagnosis: Inadequate oral intake continues.  Intervention: Patient educated to continue to increase overall calories and protein to prevent further weight loss.  It is doubtful patient will comply. Stressed the importance of adequate nutrition in the case of upcoming surgery. Questions were answered.  Teach back method used.  Monitoring, evaluation, goals: Patient will work to increase overall calories and protein to promote weight maintenance.  Next visit: Monday, November 2, during chemotherapy.  **Disclaimer: This note was dictated with voice recognition software. Similar sounding words can inadvertently be transcribed and this note may contain transcription errors which may not have been corrected upon publication of note.**

## 2014-07-02 NOTE — Telephone Encounter (Signed)
CALLED PATIENT TO INFORM OF APPT. WITH [facility] [place] ON 07-03-14 - ARRIVAL TIME - 9:45 AM  @ [place], [place] NO[place] - [phone], PATIENT TO BRING INSURANCE CARD AND PHOTO ID, LVM FOR A RETURN CALL

## 2014-07-02 NOTE — Patient Instructions (Signed)
[facility] Discharge Instructions for Patients Receiving Chemotherapy  Today you received the following chemotherapy agents taxol/carboplatin  To help prevent nausea and vomiting after your treatment, we encourage you to take your nausea medication as directed   If you develop nausea and vomiting that is not controlled by your nausea medication, call the clinic.   BELOW ARE SYMPTOMS THAT SHOULD BE REPORTED IMMEDIATELY:  *FEVER GREATER THAN 100.5 F  *CHILLS WITH OR WITHOUT FEVER  NAUSEA AND VOMITING THAT IS NOT CONTROLLED WITH YOUR NAUSEA MEDICATION  *UNUSUAL SHORTNESS OF BREATH  *UNUSUAL BRUISING OR BLEEDING  TENDERNESS IN MOUTH AND THROAT WITH OR WITHOUT PRESENCE OF ULCERS  *URINARY PROBLEMS  *BOWEL PROBLEMS  UNUSUAL RASH Items with * indicate a potential emergency and should be followed up as soon as possible.  Feel free to call the clinic you have any questions or concerns. The clinic phone number is [phone].  

## 2014-07-02 NOTE — Progress Notes (Signed)
Per Dr Benay Spice, no need to draw CMET today.

## 2014-07-02 NOTE — Progress Notes (Signed)
   Weekly Management Note:  Outpatient    ICD-9-CM ICD-10-CM   1. 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  CBCT/MVCT images/Port film x-rays were reviewed.  The chart was checked. Doing relatively well. No vomiting or diarrhea.  Still has dysphagia, slight odynophagia. No heartburn.  Physical Findings:  weight is 228 lb 11.2 oz (103.738 kg). His oral temperature is 97.6 F (36.4 C). His blood pressure is 106/66 and his pulse is 91. His respiration is 12 and oxygen saturation is 100%.  NAD, well appearing. He has lost some weight.  CBC    Component Value Date/Time   WBC 3.6* 07/02/2014 0917   RBC 4.50 07/02/2014 0917   HGB 13.0 07/02/2014 0917   HCT 39.4 07/02/2014 0917   PLT 180 07/02/2014 0917   MCV 87.6 07/02/2014 0917   MCH 28.9 07/02/2014 0917   MCHC 32.9 07/02/2014 0917   RDW 13.6 07/02/2014 0917   LYMPHSABS 0.3* 07/02/2014 0917   MONOABS 0.4 07/02/2014 0917   EOSABS 0.0 07/02/2014 0917   BASOSABS 0.0 07/02/2014 0917     CMP     Component Value Date/Time   NA 141 06/25/2014 0857   K 4.2 06/25/2014 0857   CO2 27 06/25/2014 0857   GLUCOSE 118 06/25/2014 0857   BUN 11.9 06/25/2014 0857   CREATININE 0.9 06/25/2014 0857   CALCIUM 9.7 06/25/2014 0857   PROT 6.3* 06/25/2014 0857   ALBUMIN 3.6 06/25/2014 0857   AST 19 06/25/2014 0857   ALT 22 06/25/2014 0857   ALKPHOS 48 06/25/2014 0857   BILITOT 0.50 06/25/2014 0857     Impression:  The patient is tolerating radiotherapy.   Plan:  Continue radiotherapy as planned. 1) appt with Dory [place], nutrition, today (weight loss)  2) Lidocaine given to mix w/ H20 for odynophagia  3) refer to Garald Balding for dysphagia.  -----------------------------------  Eppie Gibson, MD

## 2014-07-03 ENCOUNTER — Ambulatory Visit
Admission: RE | Admit: 2014-07-03 | Discharge: 2014-07-03 | Disposition: A | Discharge status: Home / Self Care | Payer: BC Managed Care – PPO | Source: Ambulatory Visit | Attending: Radiation Oncology | Admitting: Radiation Oncology

## 2014-07-03 ENCOUNTER — Encounter: Payer: Self-pay | Admitting: *Deleted

## 2014-07-03 ENCOUNTER — Ambulatory Visit: Payer: BC Managed Care – PPO

## 2014-07-03 DIAGNOSIS — Z510 Encounter for antineoplastic radiation therapy: Secondary | ICD-10-CM | POA: Diagnosis not present

## 2014-07-03 NOTE — Progress Notes (Signed)
[facility] Healthcare Advance Directives Clinical Social Work  Clinical Social Work was referred by patient to review and complete healthcare advance directives.  Clinical Social Worker met with patient in CSW office.  The patient designated Linda H. Bilton as their primary healthcare agent and Reilly J. Bilton as their secondary agent.  Patient also completed healthcare living will.    Clinical Social Worker notarized documents and made copies for patient/family. Clinical Social Worker will send documents to medical records to be scanned into patient's chart. Clinical Social Worker encouraged patient/family to contact with any additional questions or concerns.  Abigail Elmore, MSW, LCSW, OSW-C Clinical Social Worker [facility] [phone]       

## 2014-07-04 ENCOUNTER — Ambulatory Visit
Admission: RE | Admit: 2014-07-04 | Discharge: 2014-07-04 | Disposition: A | Discharge status: Home / Self Care | Payer: BC Managed Care – PPO | Source: Ambulatory Visit | Attending: Radiation Oncology | Admitting: Radiation Oncology

## 2014-07-04 DIAGNOSIS — Z510 Encounter for antineoplastic radiation therapy: Secondary | ICD-10-CM | POA: Diagnosis not present

## 2014-07-05 ENCOUNTER — Ambulatory Visit
Admission: RE | Admit: 2014-07-05 | Discharge: 2014-07-05 | Disposition: A | Discharge status: Home / Self Care | Payer: BC Managed Care – PPO | Source: Ambulatory Visit | Attending: Radiation Oncology | Admitting: Radiation Oncology

## 2014-07-05 DIAGNOSIS — Z510 Encounter for antineoplastic radiation therapy: Secondary | ICD-10-CM | POA: Diagnosis not present

## 2014-07-06 ENCOUNTER — Ambulatory Visit
Admission: RE | Admit: 2014-07-06 | Discharge: 2014-07-06 | Disposition: A | Discharge status: Home / Self Care | Payer: BC Managed Care – PPO | Source: Ambulatory Visit | Attending: Radiation Oncology | Admitting: Radiation Oncology

## 2014-07-06 ENCOUNTER — Other Ambulatory Visit (HOSPITAL_BASED_OUTPATIENT_CLINIC_OR_DEPARTMENT_OTHER): Payer: BC Managed Care – PPO

## 2014-07-06 DIAGNOSIS — C155 Malignant neoplasm of lower third of esophagus: Secondary | ICD-10-CM

## 2014-07-06 DIAGNOSIS — C160 Malignant neoplasm of cardia: Secondary | ICD-10-CM

## 2014-07-06 DIAGNOSIS — Z510 Encounter for antineoplastic radiation therapy: Secondary | ICD-10-CM | POA: Diagnosis not present

## 2014-07-06 LAB — CBC WITH DIFFERENTIAL/PLATELET
BASO%: 0.5 % (ref 0.0–2.0)
Basophils Absolute: 0 10*3/uL (ref 0.0–0.1)
EOS ABS: 0 10*3/uL (ref 0.0–0.5)
EOS%: 0.5 % (ref 0.0–7.0)
HCT: 40.2 % (ref 38.4–49.9)
HGB: 13.3 g/dL (ref 13.0–17.1)
LYMPH%: 7.7 % — ABNORMAL LOW (ref 14.0–49.0)
MCH: 28.8 pg (ref 27.2–33.4)
MCHC: 33 g/dL (ref 32.0–36.0)
MCV: 87.2 fL (ref 79.3–98.0)
MONO#: 0.2 10*3/uL (ref 0.1–0.9)
MONO%: 7.5 % (ref 0.0–14.0)
NEUT%: 83.8 % — ABNORMAL HIGH (ref 39.0–75.0)
NEUTROS ABS: 2.5 10*3/uL (ref 1.5–6.5)
PLATELETS: 165 10*3/uL (ref 140–400)
RBC: 4.61 10*6/uL (ref 4.20–5.82)
RDW: 13.3 % (ref 11.0–14.6)
WBC: 3 10*3/uL — ABNORMAL LOW (ref 4.0–10.3)
lymph#: 0.2 10*3/uL — ABNORMAL LOW (ref 0.9–3.3)

## 2014-07-08 ENCOUNTER — Other Ambulatory Visit: Payer: Self-pay | Admitting: Oncology

## 2014-07-09 ENCOUNTER — Encounter: Payer: Self-pay | Admitting: Radiation Oncology

## 2014-07-09 ENCOUNTER — Ambulatory Visit: Payer: BC Managed Care – PPO

## 2014-07-09 ENCOUNTER — Encounter: Payer: BC Managed Care – PPO | Admitting: Gastroenterology

## 2014-07-09 ENCOUNTER — Ambulatory Visit
Admission: RE | Admit: 2014-07-09 | Discharge: 2014-07-09 | Disposition: A | Discharge status: Home / Self Care | Payer: BC Managed Care – PPO | Source: Ambulatory Visit | Attending: Radiation Oncology | Admitting: Radiation Oncology

## 2014-07-09 ENCOUNTER — Ambulatory Visit: Payer: BC Managed Care – PPO | Admitting: Nutrition

## 2014-07-09 ENCOUNTER — Ambulatory Visit (HOSPITAL_BASED_OUTPATIENT_CLINIC_OR_DEPARTMENT_OTHER): Payer: BC Managed Care – PPO | Admitting: Oncology

## 2014-07-09 ENCOUNTER — Inpatient Hospital Stay
Admission: RE | Admit: 2014-07-09 | Discharge: 2014-07-09 | Disposition: A | Discharge status: Home / Self Care | Payer: BC Managed Care – PPO | Source: Ambulatory Visit | Attending: Radiation Oncology | Admitting: Radiation Oncology

## 2014-07-09 ENCOUNTER — Ambulatory Visit (HOSPITAL_BASED_OUTPATIENT_CLINIC_OR_DEPARTMENT_OTHER): Payer: BC Managed Care – PPO

## 2014-07-09 ENCOUNTER — Telehealth: Payer: Self-pay | Admitting: Oncology

## 2014-07-09 VITALS — BP 108/67 | HR 110 | Temp 98.1°F | Resp 16 | Wt 224.6 lb

## 2014-07-09 DIAGNOSIS — C155 Malignant neoplasm of lower third of esophagus: Secondary | ICD-10-CM

## 2014-07-09 DIAGNOSIS — C160 Malignant neoplasm of cardia: Secondary | ICD-10-CM

## 2014-07-09 DIAGNOSIS — Z510 Encounter for antineoplastic radiation therapy: Secondary | ICD-10-CM | POA: Diagnosis not present

## 2014-07-09 DIAGNOSIS — E119 Type 2 diabetes mellitus without complications: Secondary | ICD-10-CM

## 2014-07-09 DIAGNOSIS — I10 Essential (primary) hypertension: Secondary | ICD-10-CM

## 2014-07-09 DIAGNOSIS — Z5111 Encounter for antineoplastic chemotherapy: Secondary | ICD-10-CM

## 2014-07-09 MED ORDER — DEXAMETHASONE SODIUM PHOSPHATE 10 MG/ML IJ SOLN
INTRAMUSCULAR | Status: AC
  Filled 2014-07-09: qty 1

## 2014-07-09 MED ORDER — SODIUM CHLORIDE 0.9 % IV SOLN
Freq: Once | INTRAVENOUS | Status: AC
  Administered 2014-07-09: 10:00:00 via INTRAVENOUS

## 2014-07-09 MED ORDER — FAMOTIDINE IN NACL 20-0.9 MG/50ML-% IV SOLN
20.0000 mg | Freq: Once | INTRAVENOUS | Status: AC
  Administered 2014-07-09: 20 mg via INTRAVENOUS

## 2014-07-09 MED ORDER — ONDANSETRON 16 MG/50ML IVPB (CHCC)
INTRAVENOUS | Status: AC
  Filled 2014-07-09: qty 16

## 2014-07-09 MED ORDER — DEXAMETHASONE SODIUM PHOSPHATE 10 MG/ML IJ SOLN
10.0000 mg | Freq: Once | INTRAMUSCULAR | Status: AC
  Administered 2014-07-09: 10 mg via INTRAVENOUS

## 2014-07-09 MED ORDER — DIPHENHYDRAMINE HCL 50 MG/ML IJ SOLN
INTRAMUSCULAR | Status: AC
  Filled 2014-07-09: qty 1

## 2014-07-09 MED ORDER — PACLITAXEL CHEMO INJECTION 300 MG/50ML
50.0000 mg/m2 | Freq: Once | INTRAVENOUS | Status: AC
  Administered 2014-07-09: 120 mg via INTRAVENOUS
  Filled 2014-07-09: qty 20

## 2014-07-09 MED ORDER — SODIUM CHLORIDE 0.9 % IV SOLN
250.0000 mg | Freq: Once | INTRAVENOUS | Status: AC
  Administered 2014-07-09: 250 mg via INTRAVENOUS
  Filled 2014-07-09: qty 25

## 2014-07-09 MED ORDER — ONDANSETRON 16 MG/50ML IVPB (CHCC)
16.0000 mg | Freq: Once | INTRAVENOUS | Status: AC
  Administered 2014-07-09: 16 mg via INTRAVENOUS

## 2014-07-09 MED ORDER — FAMOTIDINE IN NACL 20-0.9 MG/50ML-% IV SOLN
INTRAVENOUS | Status: AC
  Filled 2014-07-09: qty 50

## 2014-07-09 MED ORDER — DIPHENHYDRAMINE HCL 50 MG/ML IJ SOLN
25.0000 mg | Freq: Once | INTRAMUSCULAR | Status: AC
  Administered 2014-07-09: 25 mg via INTRAVENOUS

## 2014-07-09 NOTE — Progress Notes (Signed)
Phoned Abelina Bachelor, RN per Dr. Pearlie Oyster order. Informed her this patient is slightly orthostatic and may require additional fluids with chemotherapy. Diane, RN verbalized understanding.

## 2014-07-09 NOTE — Progress Notes (Signed)
Nutrition followup completed with patient who is receiving chemotherapy for distal gastroesophageal cancer.  Today is his last chemotherapy.  His final radiation therapy is scheduled for November 11.  Patient's weight continues to decline and was documented as 224.6 pounds November 2, down from 228.7 pounds October 26 patient.  Weight 250.1 pounds at the start of treatment.  Patient reports he feels well.  States he slept most of yesterday.  Therefore, oral intake to is decreased.  Patient has adequate knowledge of nutrition strategies for adequate caloric and protein intake.  However, he is pleased with weight loss.  Does not see this as a detriment.  States he will see surgeon after a scan to determine whether he is a candidate for surgery.  Patient is willing to participate in nutrition study for improved outcomes.  Nutrition diagnosis: Inadequate oral intake continues.  Intervention: Patient educated on the importance of quality nutrition now and continuing after treatment is completed. Patient verbalizes understanding.  Monitoring, evaluation, goals: Patient will work to minimize further weight loss.  Next visit: To be scheduled.  **Disclaimer: This note was dictated with voice recognition software. Similar sounding words can inadvertently be transcribed and this note may contain transcription errors which may not have been corrected upon publication of note.**

## 2014-07-09 NOTE — Progress Notes (Signed)
Per pharmacy, MD capped cr.clearance at 100, ok to treat.

## 2014-07-09 NOTE — Progress Notes (Signed)
   Weekly Management Note:  Outpatient    ICD-9-CM ICD-10-CM   1. 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  CBCT/MVCT images/Port film x-rays were reviewed.  The chart was checked. Doing well.  Lidocaine helps discomfort when swallowing.  Dysphagia improving.  Does well if chewing thoroughly and sipping.    Physical Findings:  weight is 224 lb 9.6 oz (101.878 kg). His oral temperature is 98.1 F (36.7 C). His blood pressure is 108/67 and his pulse is 110. His respiration is 16 and oxygen saturation is 99%.  NAD, faint erythema over torso in fields.  RRR, CTAB  CBC    Component Value Date/Time   WBC 3.0* 07/06/2014 0846   RBC 4.61 07/06/2014 0846   HGB 13.3 07/06/2014 0846   HCT 40.2 07/06/2014 0846   PLT 165 07/06/2014 0846   MCV 87.2 07/06/2014 0846   MCH 28.8 07/06/2014 0846   MCHC 33.0 07/06/2014 0846   RDW 13.3 07/06/2014 0846   LYMPHSABS 0.2* 07/06/2014 0846   MONOABS 0.2 07/06/2014 0846   EOSABS 0.0 07/06/2014 0846   BASOSABS 0.0 07/06/2014 0846     CMP     Component Value Date/Time   NA 141 06/25/2014 0857   K 4.2 06/25/2014 0857   CO2 27 06/25/2014 0857   GLUCOSE 118 06/25/2014 0857   BUN 11.9 06/25/2014 0857   CREATININE 0.9 06/25/2014 0857   CALCIUM 9.7 06/25/2014 0857   PROT 6.3* 06/25/2014 0857   ALBUMIN 3.6 06/25/2014 0857   AST 19 06/25/2014 0857   ALT 22 06/25/2014 0857   ALKPHOS 48 06/25/2014 0857   BILITOT 0.50 06/25/2014 0857     Impression:  The patient is tolerating radiotherapy.   Plan:  Continue radiotherapy as planned. IV fluids PRN today in med/onc at chemo session.  Nursing calling upstairs to inform them of vitals. Biafine for skin. -----------------------------------  Eppie Gibson, MD

## 2014-07-09 NOTE — Patient Instructions (Signed)
[facility] Discharge Instructions for Patients Receiving Chemotherapy  Today you received the following chemotherapy agents: Taxol and Carboplatin.  To help prevent nausea and vomiting after your treatment, we encourage you to take your nausea medication as prescribed.   If you develop nausea and vomiting that is not controlled by your nausea medication, call the clinic.   BELOW ARE SYMPTOMS THAT SHOULD BE REPORTED IMMEDIATELY:  *FEVER GREATER THAN 100.5 F  *CHILLS WITH OR WITHOUT FEVER  NAUSEA AND VOMITING THAT IS NOT CONTROLLED WITH YOUR NAUSEA MEDICATION  *UNUSUAL SHORTNESS OF BREATH  *UNUSUAL BRUISING OR BLEEDING  TENDERNESS IN MOUTH AND THROAT WITH OR WITHOUT PRESENCE OF ULCERS  *URINARY PROBLEMS  *BOWEL PROBLEMS  UNUSUAL RASH Items with * indicate a potential emergency and should be followed up as soon as possible.  Feel free to call the clinic you have any questions or concerns. The clinic phone number is [phone].    

## 2014-07-09 NOTE — Telephone Encounter (Signed)
gv adn printed appt sched and avs for pt for NOV °

## 2014-07-09 NOTE — Addendum Note (Signed)
Encounter addended by: Heywood Footman, RN on: 07/09/2014 11:14 AM<BR>     Documentation filed: Notes Section

## 2014-07-09 NOTE — Progress Notes (Signed)
  [place] OFFICE PROGRESS NOTE   Diagnosis:  Esophagus cancer  INTERVAL HISTORY:   He returns as scheduled. He continues weekly Taxol/carboplatin and radiation. He reports increased malaise. The dysphagia has improved. Minimal odynophagia. No neuropathy symptoms.  Objective:  Vital signs in last 24 hours:  Blood pressure .    HEENT: no thrush or ulcers Resp: lungs clear bilaterally Cardio: regular rate and rhythm GI: no hepatomegaly, nontender Vascular: no leg edema    Lab Results:  Lab Results  Component Value Date   WBC 3.0* 07/06/2014   HGB 13.3 07/06/2014   HCT 40.2 07/06/2014   MCV 87.2 07/06/2014   PLT 165 07/06/2014   NEUTROABS 2.5 07/06/2014     Medications: I have reviewed the patient's current medications.  Assessment/Plan:  1. Adenocarcinoma the esophagus/GE junction-status post an endoscopy 05/18/2014 confirming a gastric cardia/lower esophageal mass with tumor extending proximally in the esophagus to 25 cm from the incisors  HER-2/neu amplified   Staging CT scan 05/22/2014 with indeterminate bilateral pulmonary nodules; and prominent paraesophageal, porta hepatis, and para-aortic lymph nodes   Staging PET scan 05/29/2014 confirmed hypermetabolic soft tissue thickening at the distal third of the esophagus extending into the proximal stomach. 2 additional smaller areas of hypermetabolism or noted in the more proximal esophagus with a mildly enlarged hypermetabolic gastrohepatic node.   Initiation of radiation and concurrent Taxol/carboplatin 06/11/2014  2. anorexia/weight loss 3. solid dysphagia -improved 4. Diabetes  5. Hypertension  6. Hyperlipidemia  7. back pain relieved with naproxen     Disposition: He continues to tolerate the chemotherapy and radiation well. He will complete a final treatment with Taxol/carboplatin today and is scheduled to finish radiation on 07/18/2014. He will undergo restaging scans per Dr.  Servando Snare prior to consideration for surgery.  Mr. Balaban will return for an office visit here 07/26/2014.   Kairos Panetta ANP/GNP-BC   07/09/2014  3:15 PM

## 2014-07-09 NOTE — Progress Notes (Signed)
He is currently in no pain. Pt complains of occasional sore throat during swallowing, Fatigue and Poor Appetite.  Pt presenting appropriate quality, quantity and organization of sentences. Pt occasionally has had dysphagia for liquids. PO Diet: Regular. Oral exam reveals mucous membranes moist, pharynx normal without lesions, noted a moderate amount of white exudate on tongue. Skin noted a small amount of blotchy erythema over sternum area.

## 2014-07-10 ENCOUNTER — Ambulatory Visit
Admission: RE | Admit: 2014-07-10 | Discharge: 2014-07-10 | Disposition: A | Discharge status: Home / Self Care | Payer: BC Managed Care – PPO | Source: Ambulatory Visit | Attending: Radiation Oncology | Admitting: Radiation Oncology

## 2014-07-10 DIAGNOSIS — Z510 Encounter for antineoplastic radiation therapy: Secondary | ICD-10-CM | POA: Diagnosis not present

## 2014-07-11 ENCOUNTER — Ambulatory Visit
Admission: RE | Admit: 2014-07-11 | Discharge: 2014-07-11 | Disposition: A | Discharge status: Home / Self Care | Payer: BC Managed Care – PPO | Source: Ambulatory Visit | Attending: Radiation Oncology | Admitting: Radiation Oncology

## 2014-07-11 DIAGNOSIS — Z510 Encounter for antineoplastic radiation therapy: Secondary | ICD-10-CM | POA: Diagnosis not present

## 2014-07-12 ENCOUNTER — Ambulatory Visit
Admission: RE | Admit: 2014-07-12 | Discharge: 2014-07-12 | Disposition: A | Discharge status: Home / Self Care | Payer: BC Managed Care – PPO | Source: Ambulatory Visit | Attending: Radiation Oncology | Admitting: Radiation Oncology

## 2014-07-12 DIAGNOSIS — Z510 Encounter for antineoplastic radiation therapy: Secondary | ICD-10-CM | POA: Diagnosis not present

## 2014-07-13 ENCOUNTER — Ambulatory Visit
Admission: RE | Admit: 2014-07-13 | Discharge: 2014-07-13 | Disposition: A | Discharge status: Home / Self Care | Payer: BC Managed Care – PPO | Source: Ambulatory Visit | Attending: Radiation Oncology | Admitting: Radiation Oncology

## 2014-07-13 DIAGNOSIS — Z510 Encounter for antineoplastic radiation therapy: Secondary | ICD-10-CM | POA: Diagnosis not present

## 2014-07-16 ENCOUNTER — Ambulatory Visit
Admission: RE | Admit: 2014-07-16 | Discharge: 2014-07-16 | Disposition: A | Discharge status: Home / Self Care | Payer: BC Managed Care – PPO | Source: Ambulatory Visit | Attending: Radiation Oncology | Admitting: Radiation Oncology

## 2014-07-16 ENCOUNTER — Telehealth: Payer: Self-pay | Admitting: *Deleted

## 2014-07-16 ENCOUNTER — Encounter: Payer: Self-pay | Admitting: Radiation Oncology

## 2014-07-16 VITALS — BP 96/62 | HR 110 | Temp 98.0°F | Resp 20 | Wt 218.3 lb

## 2014-07-16 DIAGNOSIS — C155 Malignant neoplasm of lower third of esophagus: Secondary | ICD-10-CM

## 2014-07-16 DIAGNOSIS — Z510 Encounter for antineoplastic radiation therapy: Secondary | ICD-10-CM | POA: Diagnosis not present

## 2014-07-16 LAB — CBC WITH DIFFERENTIAL/PLATELET
BASO%: 0.8 % (ref 0.0–2.0)
Basophils Absolute: 0 10*3/uL (ref 0.0–0.1)
EOS ABS: 0 10*3/uL (ref 0.0–0.5)
EOS%: 1 % (ref 0.0–7.0)
HEMATOCRIT: 42.4 % (ref 38.4–49.9)
HGB: 14.2 g/dL (ref 13.0–17.1)
LYMPH%: 7.6 % — ABNORMAL LOW (ref 14.0–49.0)
MCH: 29.2 pg (ref 27.2–33.4)
MCHC: 33.5 g/dL (ref 32.0–36.0)
MCV: 87 fL (ref 79.3–98.0)
MONO#: 0.4 10*3/uL (ref 0.1–0.9)
MONO%: 12.8 % (ref 0.0–14.0)
NEUT#: 2.2 10*3/uL (ref 1.5–6.5)
NEUT%: 77.8 % — AB (ref 39.0–75.0)
PLATELETS: 136 10*3/uL — AB (ref 140–400)
RBC: 4.87 10*6/uL (ref 4.20–5.82)
RDW: 13.4 % (ref 11.0–14.6)
WBC: 2.8 10*3/uL — ABNORMAL LOW (ref 4.0–10.3)
lymph#: 0.2 10*3/uL — ABNORMAL LOW (ref 0.9–3.3)

## 2014-07-16 LAB — BASIC METABOLIC PANEL (CC13)
Anion Gap: 10 mEq/L (ref 3–11)
BUN: 13.2 mg/dL (ref 7.0–26.0)
CALCIUM: 9.9 mg/dL (ref 8.4–10.4)
CO2: 26 mEq/L (ref 22–29)
CREATININE: 0.8 mg/dL (ref 0.7–1.3)
Chloride: 103 mEq/L (ref 98–109)
GLUCOSE: 127 mg/dL (ref 70–140)
Potassium: 3.9 mEq/L (ref 3.5–5.1)
SODIUM: 139 meq/L (ref 136–145)

## 2014-07-16 NOTE — Telephone Encounter (Signed)
As instructed by Dr. Isidore Moos, the labs drawn today do not reflect the need for IV fluid hydration, however, he has been instructed to please push fluid hydration.  Also advised to please call for any other concerns as needed.

## 2014-07-16 NOTE — Progress Notes (Signed)
   Weekly Management Note:  Outpatient    ICD-9-CM ICD-10-CM   1. 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  CBCT/MVCT images/Port film x-rays were reviewed.  The chart was checked. Poor appetite, fatigue, on/off nausea reported. Last cycle of chemo 1 week ago.  Not lightheaded or unsteady. Has lost weight.  Feels his PO intake of fluids is good, but having challenges with caloric intake.  Followed by nutrition.  Physical Findings:  weight is 218 lb 4.8 oz (99.02 kg). His oral temperature is 98 F (36.7 C). His blood pressure is 96/62 and his pulse is 110. His respiration is 20 and oxygen saturation is 99%.  oral mucosa moist. Minimal skin irritation (erythema) over chest.    CBC    Component Value Date/Time   WBC 3.0* 07/06/2014 0846   RBC 4.61 07/06/2014 0846   HGB 13.3 07/06/2014 0846   HCT 40.2 07/06/2014 0846   PLT 165 07/06/2014 0846   MCV 87.2 07/06/2014 0846   MCH 28.8 07/06/2014 0846   MCHC 33.0 07/06/2014 0846   RDW 13.3 07/06/2014 0846   LYMPHSABS 0.2* 07/06/2014 0846   MONOABS 0.2 07/06/2014 0846   EOSABS 0.0 07/06/2014 0846   BASOSABS 0.0 07/06/2014 0846     CMP     Component Value Date/Time   NA 141 06/25/2014 0857   K 4.2 06/25/2014 0857   CO2 27 06/25/2014 0857   GLUCOSE 118 06/25/2014 0857   BUN 11.9 06/25/2014 0857   CREATININE 0.9 06/25/2014 0857   CALCIUM 9.7 06/25/2014 0857   PROT 6.3* 06/25/2014 0857   ALBUMIN 3.6 06/25/2014 0857   AST 19 06/25/2014 0857   ALT 22 06/25/2014 0857   ALKPHOS 48 06/25/2014 0857   BILITOT 0.50 06/25/2014 0857     Impression:  The patient is tolerating radiotherapy.   Plan:  Continue radiotherapy as planned. Check CBC, BMP today. IV fluids depending on results. Declining SLP until post surgery, then will see how swallowing is.  F/u in 6 weeks, sooner if needed. Cont with  nutritionist.  -----------------------------------  Eppie Gibson, MD

## 2014-07-16 NOTE — Progress Notes (Addendum)
Here before treatment to esophaguus has had 25 tx so far,sore throat (top ), last chemotherapy was on 07/09/14, did get IVF's stated as well, feel better but weight loss from 224 to 218, took ortho vitals, sitting b/p=2121/84,P=106,  Standing b/p=96/62, P=110,. Gets nasueated at times, resolves quickly, drinks 1-2 boost daily saw B. Neff  07/09/14   7:18 AM

## 2014-07-17 ENCOUNTER — Ambulatory Visit
Admission: RE | Admit: 2014-07-17 | Discharge: 2014-07-17 | Disposition: A | Discharge status: Home / Self Care | Payer: BC Managed Care – PPO | Source: Ambulatory Visit | Attending: Radiation Oncology | Admitting: Radiation Oncology

## 2014-07-17 DIAGNOSIS — Z510 Encounter for antineoplastic radiation therapy: Secondary | ICD-10-CM | POA: Diagnosis not present

## 2014-07-18 ENCOUNTER — Encounter: Payer: Self-pay | Admitting: Radiation Oncology

## 2014-07-18 ENCOUNTER — Ambulatory Visit
Admission: RE | Admit: 2014-07-18 | Discharge: 2014-07-18 | Disposition: A | Discharge status: Home / Self Care | Payer: BC Managed Care – PPO | Source: Ambulatory Visit | Attending: Radiation Oncology | Admitting: Radiation Oncology

## 2014-07-18 DIAGNOSIS — Z510 Encounter for antineoplastic radiation therapy: Secondary | ICD-10-CM | POA: Diagnosis not present

## 2014-07-19 ENCOUNTER — Encounter: Payer: Self-pay | Admitting: Cardiothoracic Surgery

## 2014-07-19 ENCOUNTER — Ambulatory Visit (INDEPENDENT_AMBULATORY_CARE_PROVIDER_SITE_OTHER): Payer: BC Managed Care – PPO | Admitting: Cardiothoracic Surgery

## 2014-07-19 ENCOUNTER — Other Ambulatory Visit: Payer: Self-pay | Admitting: *Deleted

## 2014-07-19 VITALS — BP 118/83 | HR 109 | Resp 16 | Ht 69.0 in | Wt 214.0 lb

## 2014-07-19 DIAGNOSIS — C159 Malignant neoplasm of esophagus, unspecified: Secondary | ICD-10-CM

## 2014-07-19 DIAGNOSIS — C155 Malignant neoplasm of lower third of esophagus: Secondary | ICD-10-CM

## 2014-07-19 NOTE — Progress Notes (Signed)
[address]       [place] [address]             [phone]                    Ferd J Sutley [facility] Medical Record #[id number] Date of Birth: 07/31/1956  Referring: [name], MD Primary Care: [name], MD  Chief Complaint:    Chief Complaint  Patient presents with  . Esophageal Cancer    further discussion regarding surgery/staging scans after chemo/radiation completed    History of Present Illness:    Joel Miller 58 y.o. male is seen in the office  today for esophageal cancer. Patient first noted weight loss of up to 40-50 pounds over the past 3 months. In June of 2015 he noted increasing difficulty swallowing. In September upper GI endoscopy was performed demonstrating esophageal carcinoma. The patient had no previous history of reflux, is a nonsmoker. Per report: "Starting at the gastric cardia and extending into the lower one third of the esophagus there was a circumferential, exophytic, friable tumor causing a long stricture. The 9 mm gastroscope easily traversed this area. Tumor was seen in clusters throughout the esophagus up to approximately 25 cm from the incisors." No endoscopic ultrasound has been performed  Patient has now completed 5 weekly cycles of weekly Taxol/carboplatin  And on November 11 completed course of radiotherapy.  The patient continues to have some difficulty swallowing he notes this is mostly painful in the upper esophagus rather than obstructing, he's had no vomiting.  Diagnosis Esophagus, biopsy - ADENOCARCINOMA, INTESTINAL TYPE. PLEASE SEE COMMENT. HER2/NEU BY CISH - SHOWS AMPLIFICATION BY CISH ANALYSIS. RESULT RATIO OF HER2: CEP 17 SIGNALS 3.97 AVERAGE HER2 COPY NUMBER PER CELL 7.55 REFERENCE RANGE NEGATIVE HER2/Chr17 Ratio <2.0 and Average HER2 copy number <4.0 EQUIVOCAL HER2/Chr17 Ratio <2.0 and Average HER2 copy number 4.0 and <6.0 POSITIVE HER2/Chr17 Ratio >=2.0 and/or Average HER2 copy  number >=6.0 Current Activity/ Functional Status:  Patient is independent with mobility/ambulation, transfers, ADL's, IADL's.   Zubrod Score: At the time of surgery this patient's most appropriate activity status/level should be described as: [x]     0    Normal activity, no symptoms []     1    Restricted in physical strenuous activity but ambulatory, able to do out light work []     2    Ambulatory and capable of self care, unable to do work activities, up and about               >50 % of waking hours                              []     3    Only limited self care, in bed greater than 50% of waking hours []     4    Completely disabled, no self care, confined to bed or chair []     5    Moribund   Past Medical History  Diagnosis Date  . Hypertension   . Hyperlipidemia   . Obesity   . Hypogonadism male   . Vitamin D deficiency   . 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 Allergy     HEY FEVER  . Diabetes mellitus without complication     type II  . GERD (gastroesophageal reflux disease)     Past Surgical History  Procedure Laterality Date  . Colonoscopy    . Biospy of esphagus  05/18/14    Family History  Problem Relation Age of Onset  . Hypertension Mother   . Alzheimer's disease Mother   . Cancer Father 44    throat   patient has 3 children , one who works with epic at the oncology center , his father had a history of laryngeal cancer was a heavy smoker and ultimately succumbed to metastatic bowel cancer . His brother 2 years younger has history of prostate cancer   History   Social History  . Marital Status: Married    Spouse Name: N/A    Number of Children: 3  . Years of Education: N/A   Occupational History  . Sales Development worker, community    Social History Main Topics  . Smoking status: Current Some Day Smoker -- 1.00 packs/day for 3 years    Types: Cigars  . Smokeless tobacco: Never Used     Comment: has a cigar occasionally  . Alcohol Use: 1.0 oz/week    2  drink(s) per week     Comment: social  . Drug Use: No  . Sexual Activity: Not on file   Other Topics Concern  . Not on file   Social History Narrative    History  Smoking status  . Current Some Day Smoker -- 1.00 packs/day for 3 years  . Types: Cigars  Smokeless tobacco  . Never Used    Comment: has a cigar occasionally    History  Alcohol Use  . 1.0 oz/week  . 2 drink(s) per week    Comment: social     Allergies  Allergen Reactions  . Ace Inhibitors     cough    Current Outpatient Prescriptions  Medication Sig Dispense Refill  . aspirin 81 MG tablet Take 81 mg by mouth daily.    . Cholecalciferol (VITAMIN D PO) Take 10,000 Units by mouth daily.    Marland Kitchen emollient (BIAFINE) cream Apply 1 application topically daily. Apply to affected skin area after rad tx s daily once skin becomes irritated    . FREESTYLE LITE test strip CHECK BLOOD SUGAR ONCE DAILY 100 each 11  . lidocaine (XYLOCAINE) 2 % solution Mix 1 part 2% viscous lidocaine, 1 part H20. Swallow 10 mL of mixture 30 min before meals and at bedtime, up to QID. 100 mL 5  . losartan (COZAAR) 100 MG tablet Take 1 tablet (100 mg total) by mouth daily. 90 tablet 0  . naproxen sodium (ANAPROX) 220 MG tablet Take 220 mg by mouth as needed.    . pantoprazole (PROTONIX) 40 MG tablet Take 1 tablet 2 x daily for Acid Indigestion & Reflux 60 tablet 99  . prochlorperazine (COMPAZINE) 10 MG tablet Take 1 tablet (10 mg total) by mouth every 6 (six) hours as needed for nausea or vomiting. 30 tablet 1  . sucralfate (CARAFATE) 1 G tablet TAKE 1 TABLET (1 G TOTAL) BY MOUTH 4 (FOUR) TIMES DAILY  (WITH MEALS AND AT BEDTIME) 120 tablet 0  . testosterone (ANDROGEL) 50 MG/5GM GEL Place 5 g onto the skin daily. Apply 2 pumps daily  Dispense 1pump per month 1 Package 5  . zolpidem (AMBIEN CR) 12.5 MG CR tablet Take 12.5 mg by mouth at bedtime.     . metFORMIN (GLUCOPHAGE XR) 500 MG 24 hr tablet 2 pills twice daily with food 120 tablet 2   No  current facility-administered medications for this visit.  Review of Systems:     Cardiac Review of Systems: Y or N  Chest Pain [ n   ]  Resting SOB [ n  ] Exertional SOB  [ n ]  Orthopnea [ n ]   Pedal Edema [ n  ]    Palpitations [n ] Syncope  [n  ]   Presyncope [ n  ]  General Review of Systems: [Y] = yes [  ]=no Constitional: recent weight change Blue.Reese  ];  Wt loss over the last 3 months [ 40-50  ] anorexia [ y ]; fatigue Blue.Reese  ]; nausea [  ]; night sweats [  ]; fever [  ]; or chills [  ];          Dental: poor dentition[n  ]; Last Dentist visit:   Eye : blurred vision [  ]; diplopia [   ]; vision changes [  ];  Amaurosis fugax[  ]; Resp: cough [  ];  wheezing[  ];  hemoptysis[  ]; shortness of breath[  ]; paroxysmal nocturnal dyspnea[  ]; dyspnea on exertion[  ]; or orthopnea[  ];  GI:  gallstones[n  ], vomiting[n  ];  dysphagia[ y ]; melena[  ];  hematochezia [  ]; heartburn[  ];   Hx of  Colonoscopy[y  ]; GU: kidney stones [  ]; hematuria[  ];   dysuria [  ];  nocturia[  ];  history of     obstruction [  ]; urinary frequency [  ]             Skin: rash, swelling[  ];, hair loss[  ];  peripheral edema[  ];  or itching[  ]; Musculosketetal: myalgias[  ];  joint swelling[  ];  joint erythema[  ];  joint pain[  ];  back pain[  ];  Heme/Lymph: bruising[  ];  bleeding[  ];  anemia[  ];  Neuro: TIA[  ];  headaches[  ];  stroke[  ];  vertigo[  ];  seizures[  ];   paresthesias[  ];  difficulty walking[  ];  Psych:depression[  ]; anxiety[  ];  Endocrine: diabetes[  ];  thyroid dysfunction[  ];  Immunizations: Flu up to date [ x ]; Pneumococcal up to date [ x ];  Other: Wt Readings from Last 3 Encounters:  07/19/14 214 lb (97.07 kg)  07/16/14 218 lb 4.8 oz (99.02 kg)  07/09/14 224 lb 9.6 oz (101.878 kg)   Physical Exam: BP 118/83 mmHg  Pulse 109  Resp 16  Ht 5' 9"  (1.753 m)  Wt 214 lb (97.07 kg)  BMI 31.59 kg/m2  SpO2 98%  PHYSICAL EXAMINATION:  General appearance: alert,  cooperative and appears older than stated age Neurologic: intact Heart: regular rate and rhythm, S1, S2 normal, no murmur, click, rub or gallop Lungs: clear to auscultation bilaterally Abdomen: soft, non-tender; bowel sounds normal; no masses,  no organomegaly Extremities: extremities normal, atraumatic, no cyanosis or edema and Homans sign is negative, no sign of DVT Patient has no carotid bruits, full DP and PT pulses bilaterally Patient has no cervical supraclavicular or axillary adenopathy     Diagnostic Studies & Laboratory data:     Recent Radiology Findings:   Ct Chest W Contrast  05/22/2014   CLINICAL DATA:  Esophageal mass.  EXAM: CT CHEST, ABDOMEN, AND PELVIS WITH CONTRAST  TECHNIQUE: Multidetector CT imaging of the chest, abdomen and pelvis was performed following the standard protocol during bolus administration of  intravenous contrast.  CONTRAST:  141m OMNIPAQUE IOHEXOL 300 MG/ML  SOLN  COMPARISON:  None.  FINDINGS: CT CHEST FINDINGS  No enlarged axillary, mediastinal or hilar lymphadenopathy. Heart is normal in size. No pericardial effusion. Aorta and main pulmonary artery are normal in caliber. There is circumferential wall thickening of the distal esophagus (image 42; series 2). Multiple sub cm lymph nodes are demonstrated adjacent to the distal aspect of the esophagus.  Central airways are patent. Multiple bilateral pulmonary nodules are demonstrated including a 3 mm nodule within the left fissure (image 27; series 3); 3 mm right lower lobe nodule (image 33; series 3); 4 mm right lower lobe nodule (image 31; series 3); 5 mm right upper lobe nodule (image 29; series 3); 4 mm right upper lobe nodule (image 12; series 3). No pleural effusion or pneumothorax.  CT ABDOMEN AND PELVIS FINDINGS  Liver is normal in size and contour. Focal fatty deposition adjacent to the falciform ligament. Gallbladder is unremarkable. Portal vein is patent. There is a 1.4 cm porta hepatic lymph node (image  58; series 2). Spleen, pancreas and bilateral adrenal glands are unremarkable. Kidneys enhance symmetrically with contrast. No hydronephrosis.  Normal caliber abdominal aorta. Peripheral noncalcified atherosclerotic plaque. Fat containing right inguinal hernia. Urinary bladder is unremarkable. Central dystrophic calcifications within the prostate.  No abnormal bowel wall thickening. No evidence for bowel obstruction. No free fluid or free intraperitoneal air. Normal appendix.  Upper abdominal mesenteric fat stranding. There is a 1.9 x 1.6 cm lymph node adjacent to the GE junction (image 51; series 2) additionally there is a 2.4 x 1.1 cm left periaortic lymph node (image 60; series 2). Multiple additional adjacent subcentimeter retroperitoneal lymph nodes are identified.  Sclerotic lesion within the right ischium (image 124; series 2). Degenerative changes of the lower lumbar spine. Probable chronic transverse process fractures of the left L3 and L4 transverse processes. No aggressive or acute appearing osseous lesions.  IMPRESSION: 1. Circumferential wall thickening of the distal esophagus compatible with esophageal mass/adenocarcinoma. 2. Enlarged lymph node adjacent to the distal esophagus at the GE junction concerning for metastatic adenopathy. Additionally there is a prominent porta hepatic lymph node and an enlarged left periaortic lymph node which may represent metastatic adenopathy. 3. Multiple bilateral pulmonary nodules. While these may represent sequelae of prior infectious/inflammatory process, metastatic disease is not excluded. 4. Nonspecific upper abdominal mesenteric fat stranding.   Electronically Signed   By: DLovey NewcomerM.D.   On: 05/22/2014 16:33   Nm Pet Image Initial (pi) Skull Base To Thigh  05/29/2014   CLINICAL DATA:  Initial treatment strategy for esophageal cancer of the distal third of the esophagus.  EXAM: NUCLEAR MEDICINE PET SKULL BASE TO THIGH  TECHNIQUE: 12.4 mCi F-18 FDG was  injected intravenously. Full-ring PET imaging was performed from the skull base to thigh after the radiotracer. CT data was obtained and used for attenuation correction and anatomic localization.  FASTING BLOOD GLUCOSE:  Value: 104 mg/dl  COMPARISON:  CT of the chest, abdomen and pelvis 05/22/2014.  FINDINGS: NECK  No hypermetabolic lymph nodes in the neck.  CHEST  Profound thickening of the distal third of the esophagus which appears to extend just beyond the level of the gastroesophageal junction, throughout which there is diffuse hypermetabolism (SUVmax = 16.9). More proximally in the esophagus there are 2 additional areas of soft tissue thickening and hypermetabolism, one on image 76 of series 4 (SUVmax = 7.9), and the other on image 66 of series 4 (  SUVmax = 7.4). The intervening at esophagus appears normal between these 3 affected regions. No definite paraesophageal hypermetabolic lymphadenopathy. No hypermetabolic adenopathy within the other portions of the mediastinal or hilar regions. No suspicious pulmonary nodules or masses. No airspace consolidation, and no pleural effusion.  ABDOMEN/PELVIS  As previously discussed, there is soft tissue thickening which extends from the distal third of the esophagus into the proximal aspect of the stomach. This area is diffusely hypermetabolic (SUVmax = 7.9). There is a mildly enlarged gastrohepatic ligament lymph node measuring 1.2 cm in short axis which is hypermetabolic (SUVmax = 5.9). No abnormal hypermetabolic activity within the liver, pancreas, adrenal glands, or spleen. No hypermetabolic lymph nodes in the abdomen or pelvis.  SKELETON  No focal hypermetabolic activity to suggest skeletal metastasis.  IMPRESSION: 1. Extensive hypermetabolic soft tissue thickening of the distal third of the esophagus extending into the proximal stomach, compatible with a primary esophageal neoplasm. There are two additional smaller areas of hypermetabolism in the more proximal  esophagus (the highest of which is at the level of the aortic arch) which are also likely foci of tumor, and there is a mildly enlarged hypermetabolic gastrohepatic ligament lymph node, presumably metastatic. No distal metastatic disease otherwise known in the neck or pelvis.   Electronically Signed   By: Vinnie Langton M.D.   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 If there isno evidence of metastatic disease consider him reasonable candidate for surgical resection.  I will plan to see him back on 3 weeks, at that time we will obtain a CT scan of the chest abdomen and pelvis as followup restaging studies If there is no evidence of metastatic disease will consider transhiatal total esophagectomy and cervical esophagogastrostomy in late December or early January.  Hypertension  Hyperlipidemia  T2 NIDDM  Testosterone Deficiency  Vitamin D deficiency  Malignant neoplasm of prostate  Severe obesity (BMI >= 40)  Encounter for long-term (current) use of other medications  GERD  Personal history of colonic polyps     [name] MD      [address] [place] [address] Office [phone]   [facility]eeper [phone]  07/19/2014 2:21 PM

## 2014-07-23 NOTE — Progress Notes (Signed)
  Radiation Oncology         ([phone]) [phone] ________________________________  Name: Joel Miller MRN: [id number]  Date: 07/18/2014  DOB: July 27, 1956  End of Treatment Note  Diagnosis:   TxN2M0  Stage III Gastro-esophageal junction adenocarcinoma  Indication for treatment:  Curative w/ concurrent chemotherapy (neoadjuvant)   Radiation treatment dates:   06/11/2014-07/18/2014  Site/dose:   1) esophagus and regional nodes / 45 Gy in 25 fractions 2) esophageal nodal boost / 5.4 Gy in 3 fractions  Beams/energy:   1) 3D conformal / 15 MV 2) 3D conformal / 15 MV  Narrative: The patient tolerated radiation treatment relatively well.     Plan: The patient has completed radiation treatment. The patient will return to radiation oncology clinic for routine followup in one month. I advised them to call or return sooner if they have any questions or concerns related to their recovery or treatment.  -----------------------------------  Eppie Gibson, MD

## 2014-07-26 ENCOUNTER — Telehealth: Payer: Self-pay | Admitting: Nurse Practitioner

## 2014-07-26 ENCOUNTER — Ambulatory Visit (HOSPITAL_BASED_OUTPATIENT_CLINIC_OR_DEPARTMENT_OTHER): Payer: BC Managed Care – PPO | Admitting: Nurse Practitioner

## 2014-07-26 VITALS — BP 121/80 | HR 102 | Temp 98.4°F | Resp 20 | Ht 69.0 in | Wt 210.6 lb

## 2014-07-26 DIAGNOSIS — C155 Malignant neoplasm of lower third of esophagus: Secondary | ICD-10-CM

## 2014-07-26 DIAGNOSIS — C160 Malignant neoplasm of cardia: Secondary | ICD-10-CM

## 2014-07-26 NOTE — Progress Notes (Addendum)
  [facility] OFFICE PROGRESS NOTE   Diagnosis:  Esophagus cancer  INTERVAL HISTORY:   He returns as scheduled. He completed the final weekly Taxol/carboplatin on 07/09/2014. He completed the course of radiation on 07/18/2014. He denies dysphagia and odynophagia. He is having intermittent nausea, typically in the late afternoon. He periodically vomits. Bowels moving 2-3 times a week.  Objective:  Vital signs in last 24 hours:  Blood pressure 121/80, pulse 102, temperature 98.4 F (36.9 C), temperature source Oral, resp. rate 20, height 5' 9" (1.753 m), weight 210 lb 9.6 oz (95.528 kg), SpO2 100 %.    HEENT: no thrush or ulcers. Resp: lungs clear bilaterally. Cardio: regular rate and rhythm. GI: abdomen soft and nontender. No hepatomegaly. Vascular: no leg edema.  Lab Results:  Lab Results  Component Value Date   WBC 2.8* 07/16/2014   HGB 14.2 07/16/2014   HCT 42.4 07/16/2014   MCV 87.0 07/16/2014   PLT 136* 07/16/2014   NEUTROABS 2.2 07/16/2014    Imaging:  No results found.  Medications: I have reviewed the patient's current medications.  Assessment/Plan: 1. Adenocarcinoma the esophagus/GE junction-status post an endoscopy 05/18/2014 confirming a gastric cardia/lower esophageal mass with tumor extending proximally in the esophagus to 25 cm from the incisors  HER-2/neu amplified   Staging CT scan 05/22/2014 with indeterminate bilateral pulmonary nodules; and prominent paraesophageal, porta hepatis, and para-aortic lymph nodes   Staging PET scan 05/29/2014 confirmed hypermetabolic soft tissue thickening at the distal third of the esophagus extending into the proximal stomach. 2 additional smaller areas of hypermetabolism or noted in the more proximal esophagus with a mildly enlarged hypermetabolic gastrohepatic node.   Initiation of radiation and concurrent Taxol/carboplatin 06/11/2014; Taxol/carboplatin completed 07/09/2014; radiation completed  11/11/20152.  2. anorexia/weight loss 3. solid dysphagia -improved 4. Diabetes  5. Hypertension  6. Hyperlipidemia  7. back pain relieved with naproxen    Disposition: He has completed the course of neoadjuvant chemotherapy/radiation. He is scheduled for restaging CT scans and to see Dr. Servando Snare on 08/09/2014. We will plan to see him back in mid to late January 2016 assuming he is having surgery. He will contact the office prior to his next visit with any problems.  Patient seen with Dr. Benay Spice.    [name] ANP/GNP-BC   07/26/2014  3:14 PM  This was a shared visit with [name]. He has completed neoadjuvant therapy. He will be scheduled for a restaging evaluation by Dr. Servando Snare prior to surgery.  We will see him following surgery.  Julieanne Manson, M.D.

## 2014-07-26 NOTE — Telephone Encounter (Signed)
Gave avs & cal for Jan 2016. °

## 2014-07-30 ENCOUNTER — Ambulatory Visit: Payer: Self-pay | Admitting: Physician Assistant

## 2014-08-08 ENCOUNTER — Other Ambulatory Visit: Payer: Self-pay

## 2014-08-08 DIAGNOSIS — C155 Malignant neoplasm of lower third of esophagus: Secondary | ICD-10-CM

## 2014-08-09 ENCOUNTER — Other Ambulatory Visit: Payer: Self-pay | Admitting: *Deleted

## 2014-08-09 ENCOUNTER — Ambulatory Visit
Admission: RE | Admit: 2014-08-09 | Discharge: 2014-08-09 | Disposition: A | Discharge status: Home / Self Care | Payer: BC Managed Care – PPO | Source: Ambulatory Visit | Attending: Cardiothoracic Surgery | Admitting: Cardiothoracic Surgery

## 2014-08-09 ENCOUNTER — Ambulatory Visit (INDEPENDENT_AMBULATORY_CARE_PROVIDER_SITE_OTHER): Payer: BC Managed Care – PPO | Admitting: Cardiothoracic Surgery

## 2014-08-09 ENCOUNTER — Encounter: Payer: Self-pay | Admitting: Cardiothoracic Surgery

## 2014-08-09 VITALS — BP 110/75 | HR 92 | Resp 20 | Ht 69.0 in | Wt 208.0 lb

## 2014-08-09 DIAGNOSIS — C159 Malignant neoplasm of esophagus, unspecified: Secondary | ICD-10-CM

## 2014-08-09 DIAGNOSIS — C155 Malignant neoplasm of lower third of esophagus: Secondary | ICD-10-CM

## 2014-08-09 MED ORDER — IOHEXOL 300 MG/ML  SOLN
125.0000 mL | Freq: Once | INTRAMUSCULAR | Status: AC | PRN
  Administered 2014-08-09: 125 mL via INTRAVENOUS

## 2014-08-09 NOTE — Progress Notes (Signed)
[address]       [place] [address]             [phone]                    Joel Miller [facility] Medical Record #[id number] Date of Birth: 09/29/1955  Referring: [name], MD Primary Care: [name], MD  Chief Complaint:    Chief Complaint  Patient presents with  . Esophageal Cancer    3 week f/u with Chest/Abd/Pelvis CT    History of Present Illness:    Joel Miller 58 y.o. male is seen in the office  today for esophageal cancer. Patient first noted weight loss of up to 40-50 pounds over the past 3 months. In June of 2015 he noted increasing difficulty swallowing. In September upper GI endoscopy was performed demonstrating esophageal carcinoma. The patient had no previous history of reflux, is a nonsmoker. Per report: "Starting at the gastric cardia and extending into the lower one third of the esophagus there was a circumferential, exophytic, friable tumor causing a long stricture. The 9 mm gastroscope easily traversed this area. Tumor was seen in clusters throughout the esophagus up to approximately 25 cm from the incisors." No endoscopic ultrasound has been performed  Patient has now completed 5 weekly cycles of weekly Taxol/carboplatin  And on November 11 completed course of radiotherapy. He notes he is feeling better, his eating is almost normal. His weight has stabilized   Diagnosis Esophagus, biopsy - ADENOCARCINOMA, INTESTINAL TYPE. PLEASE SEE COMMENT. HER2/NEU BY CISH - SHOWS AMPLIFICATION BY CISH ANALYSIS. RESULT RATIO OF HER2: CEP 17 SIGNALS 3.97 AVERAGE HER2 COPY NUMBER PER CELL 7.55 REFERENCE RANGE NEGATIVE HER2/Chr17 Ratio <2.0 and Average HER2 copy number <4.0 EQUIVOCAL HER2/Chr17 Ratio <2.0 and Average HER2 copy number 4.0 and <6.0 POSITIVE HER2/Chr17 Ratio >=2.0 and/or Average HER2 copy number >=6.0 Current Activity/ Functional Status:  Patient is independent with mobility/ambulation, transfers, ADL's,  IADL's.   Zubrod Score: At the time of surgery this patient's most appropriate activity status/level should be described as: _0     0    Normal activity, no symptoms _1     1    Restricted in physical strenuous activity but ambulatory, able to do out light work _2     2    Ambulatory and capable of self care, unable to do work activities, up and about               >50 % of waking hours                              _3     3    Only limited self care, in bed greater than 50% of waking hours _4     4    Completely disabled, no self care, confined to bed or chair _5     5    Moribund   Past Medical History  Diagnosis Date  . Hypertension   . Hyperlipidemia   . Obesity   . Hypogonadism male   . Vitamin D deficiency   . 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 Allergy     HEY FEVER  . Diabetes mellitus without complication     type II  . GERD (gastroesophageal reflux disease)     Past Surgical History  Procedure Laterality Date  . Colonoscopy    . Biospy  of esphagus  05/18/14    Family History  Problem Relation Age of Onset  . Hypertension Mother   . Alzheimer's disease Mother   . Cancer Father 65    throat   patient has 3 children , one who works with epic at the oncology center , his father had a history of laryngeal cancer was a heavy smoker and ultimately succumbed to metastatic bowel cancer . His brother 2 years younger has history of prostate cancer   History   Social History  . Marital Status: Married    Spouse Name: N/A    Number of Children: 3  . Years of Education: N/A   Occupational History  . Sales Development worker, community    Social History Main Topics  . Smoking status: Current Some Day Smoker -- 1.00 packs/day for 3 years    Types: Cigars  . Smokeless tobacco: Never Used     Comment: has a cigar occasionally  . Alcohol Use: 1.0 oz/week    2 drink(s) per week     Comment: social  . Drug Use: No  . Sexual Activity: Not on file      History  Smoking  status  . Current Some Day Smoker -- 1.00 packs/day for 3 years  . Types: Cigars  Smokeless tobacco  . Never Used    Comment: has a cigar occasionally    History  Alcohol Use  . 1.0 oz/week  . 2 drink(s) per week    Comment: social     Allergies  Allergen Reactions  . Ace Inhibitors     cough    Current Outpatient Prescriptions  Medication Sig Dispense Refill  . aspirin 81 MG tablet Take 81 mg by mouth daily.    . Cholecalciferol (VITAMIN D PO) Take 10,000 Units by mouth daily.    Marland Kitchen emollient (BIAFINE) cream Apply 1 application topically daily. Apply to affected skin area after rad tx s daily once skin becomes irritated    . FREESTYLE LITE test strip CHECK BLOOD SUGAR ONCE DAILY 100 each 11  . lidocaine (XYLOCAINE) 2 % solution Mix 1 part 2% viscous lidocaine, 1 part H20. Swallow 10 mL of mixture 30 min before meals and at bedtime, up to QID. 100 mL 5  . losartan (COZAAR) 100 MG tablet Take 1 tablet (100 mg total) by mouth daily. 90 tablet 0  . naproxen sodium (ANAPROX) 220 MG tablet Take 220 mg by mouth as needed.    . pantoprazole (PROTONIX) 40 MG tablet Take 1 tablet 2 x daily for Acid Indigestion & Reflux 60 tablet 99  . prochlorperazine (COMPAZINE) 10 MG tablet Take 1 tablet (10 mg total) by mouth every 6 (six) hours as needed for nausea or vomiting. 30 tablet 1  . sucralfate (CARAFATE) 1 G tablet TAKE 1 TABLET (1 G TOTAL) BY MOUTH 4 (FOUR) TIMES DAILY  (WITH MEALS AND AT BEDTIME) 120 tablet 0  . testosterone (ANDROGEL) 50 MG/5GM GEL Place 5 g onto the skin daily. Apply 2 pumps daily  Dispense 1pump per month 1 Package 5  . zolpidem (AMBIEN CR) 12.5 MG CR tablet Take 12.5 mg by mouth at bedtime.      No current facility-administered medications for this visit.     Review of Systems:     Cardiac Review of Systems: Y or N  Chest Pain [ n   ]  Resting SOB [ n  ] Exertional SOB  [ n ]  Orthopnea [ n ]  Pedal Edema [ n  ]    Palpitations [n ] Syncope  Florencio.Farrier  ]   Presyncope [  n  ]  General Review of Systems: [Y] = yes [  ]=no Constitional: recent weight change Blue.Reese  ];  Wt loss over the last 3 months [ 40-50  ] anorexia [ y ]; fatigue Blue.Reese  ]; nausea [  ]; night sweats [  ]; fever [  ]; or chills [  ];          Dental: poor dentition[n  ]; Last Dentist visit:   Eye : blurred vision [  ]; diplopia [   ]; vision changes [  ];  Amaurosis fugax[  ]; Resp: cough [  ];  wheezing[  ];  hemoptysis[  ]; shortness of breath[  ]; paroxysmal nocturnal dyspnea[  ]; dyspnea on exertion[  ]; or orthopnea[  ];  GI:  gallstones[n  ], vomiting[n  ];  dysphagia[ y ]; melena[  ];  hematochezia [  ]; heartburn[  ];   Hx of  Colonoscopy[y  ]; GU: kidney stones [  ]; hematuria[  ];   dysuria [  ];  nocturia[  ];  history of     obstruction [  ]; urinary frequency [  ]             Skin: rash, swelling[  ];, hair loss[  ];  peripheral edema[  ];  or itching[  ]; Musculosketetal: myalgias[  ];  joint swelling[  ];  joint erythema[  ];  joint pain[  ];  back pain[  ];  Heme/Lymph: bruising[  ];  bleeding[  ];  anemia[  ];  Neuro: TIA[  ];  headaches[  ];  stroke[  ];  vertigo[  ];  seizures[  ];   paresthesias[  ];  difficulty walking[  ];  Psych:depression[  ]; anxiety[  ];  Endocrine: diabetes[  ];  thyroid dysfunction[  ];  Immunizations: Flu up to date [ x ]; Pneumococcal up to date [ x ];  Other: Wt Readings from Last 3 Encounters:  08/09/14 208 lb (94.348 kg)  07/26/14 210 lb 9.6 oz (95.528 kg)  07/19/14 214 lb (97.07 kg)   Physical Exam: BP 110/75 mmHg  Pulse 92  Resp 20  Ht _0  (1.753 m)  Wt 208 lb (94.348 kg)  BMI 30.70 kg/m2  SpO2 96%  PHYSICAL EXAMINATION:  General appearance: alert, cooperative and appears older than stated age Neurologic: intact Heart: regular rate and rhythm, S1, S2 normal, no murmur, click, rub or gallop Lungs: clear to auscultation bilaterally Abdomen: soft, non-tender; bowel sounds normal; no masses,  no organomegaly Extremities: extremities  normal, atraumatic, no cyanosis or edema and Homans sign is negative, no sign of DVT Patient has no carotid bruits, full DP and PT pulses bilaterally Patient has no cervical supraclavicular or axillary adenopathy     Diagnostic Studies & Laboratory data:     Recent Radiology Findings:  Ct Chest W Contrast  08/09/2014   CLINICAL DATA:  58 year old male with history of esophageal cancer diagnosed in September 2015 status post chemotherapy and radiation therapy completed on 07/18/2014. No current complaints.  EXAM: CT CHEST, ABDOMEN, AND PELVIS WITH CONTRAST  TECHNIQUE: Multidetector CT imaging of the chest, abdomen and pelvis was performed following the standard protocol during bolus administration of intravenous contrast.  CONTRAST:  168m OMNIPAQUE IOHEXOL 300 MG/ML  SOLN  COMPARISON:  PET-CT 05/29/2014.  FINDINGS: CT CHEST FINDINGS  Mediastinum: There continues to be some mild thickening of the distal esophagus immediately above the gastroesophageal junction. The thickening of the cardia of the stomach is less apparent than the prior PET-CT. There also is some mild thickening of esophagus more proximally, shortly above the level of the carina adjacent to the aortic arch (image 19 of series 3), as demonstrated on the prior PET, and more inferiorly (image 27 of series 3), also demonstrated on the prior PET. Heart size is normal. There is no significant pericardial fluid, thickening or pericardial calcification. No pathologically enlarged mediastinal or hilar lymph nodes.  Lungs/Pleura: A few tiny 1-2 mm pulmonary nodules are seen in the periphery of the lungs bilaterally, which are highly nonspecific but unchanged compared to prior study 05/22/2014, likely areas of mucoid impaction within terminal bronchioles. No larger more suspicious appearing pulmonary nodules or masses are otherwise noted. No acute consolidative airspace disease. No pleural effusions.  Musculoskeletal: There are no aggressive appearing  lytic or blastic lesions noted in the visualized portions of the skeleton.  CT ABDOMEN AND PELVIS FINDINGS  Hepatobiliary: No cystic or solid hepatic lesions. No intra or extrahepatic biliary ductal dilatation. Gallbladder is normal in appearance.  Pancreas: Unremarkable.  Spleen: Unremarkable.  Adrenals/Urinary Tract: Bilateral adrenal glands and bilateral kidneys are normal in appearance. No hydroureteronephrosis. Urinary bladder is normal in appearance.  Stomach/Bowel: As discussed above, the thickening of the cardia of the stomach is less apparent than the prior PET-CT. The remainder of the stomach is otherwise unremarkable. No pathologic dilatation of small bowel or colon.  Vascular/Lymphatic: Atherosclerosis throughout the abdominal and pelvic vasculature, without evidence of aneurysm or dissection. Previously noted mildly enlarged gastrohepatic ligament lymph node is smaller than the prior examination, currently measuring 9 mm in short axis (image 50 of series 3). No new lymphadenopathy is otherwise noted in the abdomen or pelvis.  Reproductive: Prostate gland and seminal vesicles are unremarkable in appearance.  Other: No significant volume of ascites.  No pneumoperitoneum.  Musculoskeletal: There are no aggressive appearing lytic or blastic lesions noted in the visualized portions of the skeleton.  IMPRESSION: 1. Today's study demonstrates a positive response to therapy. Specifically, there has been a slight decrease in soft tissue thickening in the distal esophagus, gastroesophageal junction and proximal cardia of the stomach, and regression of previously noted gastrohepatic lymphadenopathy. Other subtle areas of soft tissue thickening in the more proximal esophagus are similar to the prior study. No new sites of disease are identified in the lungs, abdomen or pelvis. 2. Mild atherosclerosis. 3. Additional incidental findings, as above.   Electronically Signed   By: Vinnie Langton M.D.   On: 08/09/2014  10:42    Ct Chest W Contrast  05/22/2014   CLINICAL DATA:  Esophageal mass.  EXAM: CT CHEST, ABDOMEN, AND PELVIS WITH CONTRAST  TECHNIQUE: Multidetector CT imaging of the chest, abdomen and pelvis was performed following the standard protocol during bolus administration of intravenous contrast.  CONTRAST:  160m OMNIPAQUE IOHEXOL 300 MG/ML  SOLN  COMPARISON:  None.  FINDINGS: CT CHEST FINDINGS  No enlarged axillary, mediastinal or hilar lymphadenopathy. Heart is normal in size. No pericardial effusion. Aorta and main pulmonary artery are normal in caliber. There is circumferential wall thickening of the distal esophagus (image 42; series 2). Multiple sub cm lymph nodes are demonstrated adjacent to the distal aspect of the esophagus.  Central airways are patent. Multiple bilateral pulmonary nodules are demonstrated including a 3 mm nodule within the left fissure (image 27; series  3); 3 mm right lower lobe nodule (image 33; series 3); 4 mm right lower lobe nodule (image 31; series 3); 5 mm right upper lobe nodule (image 29; series 3); 4 mm right upper lobe nodule (image 12; series 3). No pleural effusion or pneumothorax.  CT ABDOMEN AND PELVIS FINDINGS  Liver is normal in size and contour. Focal fatty deposition adjacent to the falciform ligament. Gallbladder is unremarkable. Portal vein is patent. There is a 1.4 cm porta hepatic lymph node (image 58; series 2). Spleen, pancreas and bilateral adrenal glands are unremarkable. Kidneys enhance symmetrically with contrast. No hydronephrosis.  Normal caliber abdominal aorta. Peripheral noncalcified atherosclerotic plaque. Fat containing right inguinal hernia. Urinary bladder is unremarkable. Central dystrophic calcifications within the prostate.  No abnormal bowel wall thickening. No evidence for bowel obstruction. No free fluid or free intraperitoneal air. Normal appendix.  Upper abdominal mesenteric fat stranding. There is a 1.9 x 1.6 cm lymph node adjacent to the GE  junction (image 51; series 2) additionally there is a 2.4 x 1.1 cm left periaortic lymph node (image 60; series 2). Multiple additional adjacent subcentimeter retroperitoneal lymph nodes are identified.  Sclerotic lesion within the right ischium (image 124; series 2). Degenerative changes of the lower lumbar spine. Probable chronic transverse process fractures of the left L3 and L4 transverse processes. No aggressive or acute appearing osseous lesions.  IMPRESSION: 1. Circumferential wall thickening of the distal esophagus compatible with esophageal mass/adenocarcinoma. 2. Enlarged lymph node adjacent to the distal esophagus at the GE junction concerning for metastatic adenopathy. Additionally there is a prominent porta hepatic lymph node and an enlarged left periaortic lymph node which may represent metastatic adenopathy. 3. Multiple bilateral pulmonary nodules. While these may represent sequelae of prior infectious/inflammatory process, metastatic disease is not excluded. 4. Nonspecific upper abdominal mesenteric fat stranding.   Electronically Signed   By: Lovey Newcomer M.D.   On: 05/22/2014 16:33   Nm Pet Image Initial (pi) Skull Base To Thigh  05/29/2014   CLINICAL DATA:  Initial treatment strategy for esophageal cancer of the distal third of the esophagus.  EXAM: NUCLEAR MEDICINE PET SKULL BASE TO THIGH  TECHNIQUE: 12.4 mCi F-18 FDG was injected intravenously. Full-ring PET imaging was performed from the skull base to thigh after the radiotracer. CT data was obtained and used for attenuation correction and anatomic localization.  FASTING BLOOD GLUCOSE:  Value: 104 mg/dl  COMPARISON:  CT of the chest, abdomen and pelvis 05/22/2014.  FINDINGS: NECK  No hypermetabolic lymph nodes in the neck.  CHEST  Profound thickening of the distal third of the esophagus which appears to extend just beyond the level of the gastroesophageal junction, throughout which there is diffuse hypermetabolism (SUVmax = 16.9). More  proximally in the esophagus there are 2 additional areas of soft tissue thickening and hypermetabolism, one on image 76 of series 4 (SUVmax = 7.9), and the other on image 66 of series 4 (SUVmax = 7.4). The intervening at esophagus appears normal between these 3 affected regions. No definite paraesophageal hypermetabolic lymphadenopathy. No hypermetabolic adenopathy within the other portions of the mediastinal or hilar regions. No suspicious pulmonary nodules or masses. No airspace consolidation, and no pleural effusion.  ABDOMEN/PELVIS  As previously discussed, there is soft tissue thickening which extends from the distal third of the esophagus into the proximal aspect of the stomach. This area is diffusely hypermetabolic (SUVmax = 7.9). There is a mildly enlarged gastrohepatic ligament lymph node measuring 1.2 cm in short axis which is  hypermetabolic (SUVmax = 5.9). No abnormal hypermetabolic activity within the liver, pancreas, adrenal glands, or spleen. No hypermetabolic lymph nodes in the abdomen or pelvis.  SKELETON  No focal hypermetabolic activity to suggest skeletal metastasis.  IMPRESSION: 1. Extensive hypermetabolic soft tissue thickening of the distal third of the esophagus extending into the proximal stomach, compatible with a primary esophageal neoplasm. There are two additional smaller areas of hypermetabolism in the more proximal esophagus (the highest of which is at the level of the aortic arch) which are also likely foci of tumor, and there is a mildly enlarged hypermetabolic gastrohepatic ligament lymph node, presumably metastatic. No distal metastatic disease otherwise known in the neck or pelvis.   Electronically Signed   By: Vinnie Langton M.D.   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 If there is no evidence of metastatic disease consider him reasonable candidate for surgical resection.   CT scan of the chest abdomen and pelvis as followup restaging studies done with evidence of response to therapy I recommended to the patient that we proceed with transhiatal total esophagectomy and cervical esophagogastrostomy, bronchoscopy, feeding jejunostomy and pyloroplasty. The patient would like to proceed on December 30   I had a detailed discussion with Joel Miller regarding the magnitude of the surgical esophagectomy procedure as well as the risks, the expected benefits, and alternatives.  I quoted Joel Miller 5% perioperative mortality rate and a complication rate as high as 40%.  We specifically discussed complications, which include, but were not limited to: recurrent nerve injury with possible permanent hoarseness, anastomotic leak, airway and great vessel injury, conduit ischemia, thoracic duct leak, the inability to complete the operation via a transhiatal approach requiring a right thoracotomy,  bleeding, need for blood transfusion and the potential need for ventilator support.  Joel Miller has had questions answered is well informed and willing to proceed.    Hypertension  Hyperlipidemia  T2 NIDDM  Testosterone Deficiency  Vitamin D deficiency  Malignant neoplasm of prostate  Severe obesity (BMI >= 40)  Encounter for long-term (current) use of other medications  GERD  Personal history of colonic polyps     [name] MD      [facility] [place] [address] Office [phone]   [place]  08/09/2014 4:47 PM

## 2014-08-20 ENCOUNTER — Encounter: Payer: Self-pay | Admitting: Radiation Oncology

## 2014-08-22 ENCOUNTER — Ambulatory Visit
Admission: RE | Admit: 2014-08-22 | Discharge: 2014-08-22 | Disposition: A | Discharge status: Home / Self Care | Payer: BC Managed Care – PPO | Source: Ambulatory Visit | Attending: Radiation Oncology | Admitting: Radiation Oncology

## 2014-08-22 ENCOUNTER — Encounter: Payer: Self-pay | Admitting: Radiation Oncology

## 2014-08-22 VITALS — BP 103/60 | HR 66 | Temp 98.3°F | Ht 69.0 in | Wt 212.1 lb

## 2014-08-22 DIAGNOSIS — C155 Malignant neoplasm of lower third of esophagus: Secondary | ICD-10-CM

## 2014-08-22 HISTORY — DX: Personal history of irradiation: Z92.3

## 2014-08-22 NOTE — Progress Notes (Signed)
Mr. Cypress here with report of "normal swallwing", with  an ocassional "phlemy" cough in the evenings.  He notes a sensation of fullness in the mid abdominal region, typically in the evenings.  Gained 4 lbs since his last weight on 08/09/14.

## 2014-08-22 NOTE — Progress Notes (Signed)
Radiation Oncology         [phone] ________________________________  Name: GEVORK AYYAD MRN: [id number]  Date: 08/22/2014  DOB: June 07, 1956  Follow-Up Visit Note  outpatient  CC: [name], MD  [name], MD  Diagnosis and Prior Radiotherapy: No diagnosis found.  [id number]  Stage III Gastro-esophageal junction adenocarcinoma  Indication for treatment:  Curative w/ concurrent chemotherapy (neoadjuvant)   Radiation treatment dates:   06/11/2014-07/18/2014  Site/dose:   1) esophagus and regional nodes / 45 Gy in 25 fractions 2) esophageal nodal boost / 5.4 Gy in 3 fractions  Narrative:  The patient returns today for routine follow-up.   Surgery pending for 09/05/14.    Has questions about the pros/cons of surgery.  Will make appointment with Dr. Servando Snare to discuss.   Gained 4lb. Swallowing well.  Skin has healed nicely in RT fields.    ALLERGIES:  is allergic to ace inhibitors.  Meds: Current Outpatient Prescriptions  Medication Sig Dispense Refill  . aspirin 81 MG tablet Take 81 mg by mouth daily.    . Cholecalciferol (VITAMIN D PO) Take 10,000 Units by mouth daily.    . prochlorperazine (COMPAZINE) 10 MG tablet Take 10 mg by mouth every 6 (six) hours as needed for nausea or vomiting.    Marland Kitchen testosterone (ANDROGEL) 50 MG/5GM GEL Place 5 g onto the skin daily. Apply 2 pumps daily  Dispense 1pump per month 1 Package 5  . zolpidem (AMBIEN CR) 12.5 MG CR tablet Take 12.5 mg by mouth at bedtime.     Marland Kitchen losartan (COZAAR) 100 MG tablet Take 1 tablet (100 mg total) by mouth daily. (Patient not taking: Reported on 08/22/2014) 90 tablet 0  . naproxen sodium (ANAPROX) 220 MG tablet Take 220 mg by mouth as needed.     No current facility-administered medications for this encounter.    Physical Findings: The patient is in no acute distress. Patient is alert and oriented.  height is 5\' 9"  (1.753 m) and weight is 212 lb 1.6 oz (96.208 kg). His temperature is 98.3 F  (36.8 C). His blood pressure is 103/60 and his pulse is 66. .    Mild dryness of skin over back/chest in RT fields.  Lab Findings: Lab Results  Component Value Date   WBC 2.8* 07/16/2014   HGB 14.2 07/16/2014   HCT 42.4 07/16/2014   MCV 87.0 07/16/2014   PLT 136* 07/16/2014    Radiographic Findings: Ct Chest W Contrast  08/09/2014   CLINICAL DATA:  58 year old male with history of esophageal cancer diagnosed in September 2015 status post chemotherapy and radiation therapy completed on 07/18/2014. No current complaints.  EXAM: CT CHEST, ABDOMEN, AND PELVIS WITH CONTRAST  TECHNIQUE: Multidetector CT imaging of the chest, abdomen and pelvis was performed following the standard protocol during bolus administration of intravenous contrast.  CONTRAST:  1101mL OMNIPAQUE IOHEXOL 300 MG/ML  SOLN  COMPARISON:  PET-CT 05/29/2014.  FINDINGS: CT CHEST FINDINGS  Mediastinum: There continues to be some mild thickening of the distal esophagus immediately above the gastroesophageal junction. The thickening of the cardia of the stomach is less apparent than the prior PET-CT. There also is some mild thickening of esophagus more proximally, shortly above the level of the carina adjacent to the aortic arch (image 19 of series 3), as demonstrated on the prior PET, and more inferiorly (image 27 of series 3), also demonstrated on the prior PET. Heart size is normal. There is no significant pericardial fluid, thickening or pericardial  calcification. No pathologically enlarged mediastinal or hilar lymph nodes.  Lungs/Pleura: A few tiny 1-2 mm pulmonary nodules are seen in the periphery of the lungs bilaterally, which are highly nonspecific but unchanged compared to prior study 05/22/2014, likely areas of mucoid impaction within terminal bronchioles. No larger more suspicious appearing pulmonary nodules or masses are otherwise noted. No acute consolidative airspace disease. No pleural effusions.  Musculoskeletal: There are no  aggressive appearing lytic or blastic lesions noted in the visualized portions of the skeleton.  CT ABDOMEN AND PELVIS FINDINGS  Hepatobiliary: No cystic or solid hepatic lesions. No intra or extrahepatic biliary ductal dilatation. Gallbladder is normal in appearance.  Pancreas: Unremarkable.  Spleen: Unremarkable.  Adrenals/Urinary Tract: Bilateral adrenal glands and bilateral kidneys are normal in appearance. No hydroureteronephrosis. Urinary bladder is normal in appearance.  Stomach/Bowel: As discussed above, the thickening of the cardia of the stomach is less apparent than the prior PET-CT. The remainder of the stomach is otherwise unremarkable. No pathologic dilatation of small bowel or colon.  Vascular/Lymphatic: Atherosclerosis throughout the abdominal and pelvic vasculature, without evidence of aneurysm or dissection. Previously noted mildly enlarged gastrohepatic ligament lymph node is smaller than the prior examination, currently measuring 9 mm in short axis (image 50 of series 3). No new lymphadenopathy is otherwise noted in the abdomen or pelvis.  Reproductive: Prostate gland and seminal vesicles are unremarkable in appearance.  Other: No significant volume of ascites.  No pneumoperitoneum.  Musculoskeletal: There are no aggressive appearing lytic or blastic lesions noted in the visualized portions of the skeleton.  IMPRESSION: 1. Today's study demonstrates a positive response to therapy. Specifically, there has been a slight decrease in soft tissue thickening in the distal esophagus, gastroesophageal junction and proximal cardia of the stomach, and regression of previously noted gastrohepatic lymphadenopathy. Other subtle areas of soft tissue thickening in the more proximal esophagus are similar to the prior study. No new sites of disease are identified in the lungs, abdomen or pelvis. 2. Mild atherosclerosis. 3. Additional incidental findings, as above.   Electronically Signed   By: Vinnie Langton  M.D.   On: 08/09/2014 10:42   Ct Abdomen Pelvis W Contrast  08/09/2014   CLINICAL DATA:  58 year old male with history of esophageal cancer diagnosed in September 2015 status post chemotherapy and radiation therapy completed on 07/18/2014. No current complaints.  EXAM: CT CHEST, ABDOMEN, AND PELVIS WITH CONTRAST  TECHNIQUE: Multidetector CT imaging of the chest, abdomen and pelvis was performed following the standard protocol during bolus administration of intravenous contrast.  CONTRAST:  168mL OMNIPAQUE IOHEXOL 300 MG/ML  SOLN  COMPARISON:  PET-CT 05/29/2014.  FINDINGS: CT CHEST FINDINGS  Mediastinum: There continues to be some mild thickening of the distal esophagus immediately above the gastroesophageal junction. The thickening of the cardia of the stomach is less apparent than the prior PET-CT. There also is some mild thickening of esophagus more proximally, shortly above the level of the carina adjacent to the aortic arch (image 19 of series 3), as demonstrated on the prior PET, and more inferiorly (image 27 of series 3), also demonstrated on the prior PET. Heart size is normal. There is no significant pericardial fluid, thickening or pericardial calcification. No pathologically enlarged mediastinal or hilar lymph nodes.  Lungs/Pleura: A few tiny 1-2 mm pulmonary nodules are seen in the periphery of the lungs bilaterally, which are highly nonspecific but unchanged compared to prior study 05/22/2014, likely areas of mucoid impaction within terminal bronchioles. No larger more suspicious appearing pulmonary nodules  or masses are otherwise noted. No acute consolidative airspace disease. No pleural effusions.  Musculoskeletal: There are no aggressive appearing lytic or blastic lesions noted in the visualized portions of the skeleton.  CT ABDOMEN AND PELVIS FINDINGS  Hepatobiliary: No cystic or solid hepatic lesions. No intra or extrahepatic biliary ductal dilatation. Gallbladder is normal in appearance.   Pancreas: Unremarkable.  Spleen: Unremarkable.  Adrenals/Urinary Tract: Bilateral adrenal glands and bilateral kidneys are normal in appearance. No hydroureteronephrosis. Urinary bladder is normal in appearance.  Stomach/Bowel: As discussed above, the thickening of the cardia of the stomach is less apparent than the prior PET-CT. The remainder of the stomach is otherwise unremarkable. No pathologic dilatation of small bowel or colon.  Vascular/Lymphatic: Atherosclerosis throughout the abdominal and pelvic vasculature, without evidence of aneurysm or dissection. Previously noted mildly enlarged gastrohepatic ligament lymph node is smaller than the prior examination, currently measuring 9 mm in short axis (image 50 of series 3). No new lymphadenopathy is otherwise noted in the abdomen or pelvis.  Reproductive: Prostate gland and seminal vesicles are unremarkable in appearance.  Other: No significant volume of ascites.  No pneumoperitoneum.  Musculoskeletal: There are no aggressive appearing lytic or blastic lesions noted in the visualized portions of the skeleton.  IMPRESSION: 1. Today's study demonstrates a positive response to therapy. Specifically, there has been a slight decrease in soft tissue thickening in the distal esophagus, gastroesophageal junction and proximal cardia of the stomach, and regression of previously noted gastrohepatic lymphadenopathy. Other subtle areas of soft tissue thickening in the more proximal esophagus are similar to the prior study. No new sites of disease are identified in the lungs, abdomen or pelvis. 2. Mild atherosclerosis. 3. Additional incidental findings, as above.   Electronically Signed   By: Vinnie Langton M.D.   On: 08/09/2014 10:42    Impression/Plan:  Doing well. Proceed with surgery / discussion with Dr Servando Snare.  Will f/u with me in 6mo.  He would like to have imaging for surveillance and he has intermittent pains/symptoms that are unclear to him whether related to  his cancer; I think occasional CT scans for now will be reasonable.  Will get a CT of chest and abdomen at that time.  _____________________________________   Eppie Gibson, MD

## 2014-08-23 ENCOUNTER — Telehealth: Payer: Self-pay | Admitting: *Deleted

## 2014-08-23 NOTE — Telephone Encounter (Signed)
CALLED PATIENT TO INFORM OF TEST AND FU, LVM FOR A RETURN CALL 

## 2014-08-24 ENCOUNTER — Ambulatory Visit: Payer: BC Managed Care – PPO | Admitting: Radiation Oncology

## 2014-09-01 ENCOUNTER — Encounter: Payer: Self-pay | Admitting: *Deleted

## 2014-09-01 NOTE — Pre-Procedure Instructions (Addendum)
[facility]  09/01/2014   Your procedure is scheduled on:  December 30  Report to [facility] Admitting at 06:30 AM.  Call this number if you have problems the morning of surgery: [phone]   Remember:   Do not eat food or drink liquids after midnight.   Take these medicines the morning of surgery with A SIP OF WATER: Prochlorperazine (compazine) if needed   STOP Aspirin, Naproxen today   STOP/ Do not take Aspirin, Aleve, Naproxen, Advil, Ibuprofen, Motrin, Vitamins, Herbs, or Supplements starting today   Do not wear jewelry, make-up or nail polish.  Do not wear lotions, powders, or perfumes. You may wear deodorant.  Do not shave 48 hours prior to surgery. Men may shave face and neck.  Do not bring valuables to the hospital.  [facility] is not responsible  for any belongings or valuables.               Contacts, dentures or bridgework may not be worn into surgery.  Leave suitcase in the car. After surgery it may be brought to your room.  For patients admitted to the hospital, discharge time is determined by your treatment team.                 Special Instructions: [facility] - Preparing for Surgery  Before surgery, you can play an important role.  Because skin is not sterile, your skin needs to be as free of germs as possible.  You can reduce the number of germs on you skin by washing with CHG (chlorahexidine gluconate) soap before surgery.  CHG is an antiseptic cleaner which kills germs and bonds with the skin to continue killing germs even after washing.  Please DO NOT use if you have an allergy to CHG or antibacterial soaps.  If your skin becomes reddened/irritated stop using the CHG and inform your nurse when you arrive at Short Stay.  Do not shave (including legs and underarms) for at least 48 hours prior to the first CHG shower.  You may shave your face.  Please follow these instructions carefully:   1.  Shower with CHG Soap the night before surgery  and the morning of Surgery.  2.  If you choose to wash your hair, wash your hair first as usual with your normal shampoo.  3.  After you shampoo, rinse your hair and body thoroughly to remove the shampoo.  4.  Use CHG as you would any other liquid soap.  You can apply CHG directly to the skin and wash gently with scrungie or a clean washcloth.  5.  Apply the CHG Soap to your body ONLY FROM THE NECK DOWN.  Do not use on open wounds or open sores.  Avoid contact with your eyes, ears, mouth and genitals (private parts).  Wash genitals (private parts) with your normal soap.  6.  Wash thoroughly, paying special attention to the area where your surgery will be performed.  7.  Thoroughly rinse your body with warm water from the neck down.  8.  DO NOT shower/wash with your normal soap after using and rinsing off the CHG Soap.  9.  Pat yourself dry with a clean towel.            10.  Wear clean pajamas.            11.  Place clean sheets on your bed the night of your first shower and do not sleep with pets.  Day of Surgery  Do not apply any lotions the morning of surgery.  Please wear clean clothes to the hospital/surgery center.     Please read over the following fact sheets that you were given: Pain Booklet, Coughing and Deep Breathing, Blood Transfusion Information and Surgical Site Infection Prevention

## 2014-09-03 ENCOUNTER — Encounter (HOSPITAL_COMMUNITY)
Admission: RE | Admit: 2014-09-03 | Discharge: 2014-09-03 | Disposition: A | Discharge status: Home / Self Care | Payer: BLUE CROSS/BLUE SHIELD | Source: Ambulatory Visit | Attending: Cardiothoracic Surgery | Admitting: Cardiothoracic Surgery

## 2014-09-03 ENCOUNTER — Encounter (HOSPITAL_COMMUNITY): Payer: Self-pay

## 2014-09-03 VITALS — BP 117/67 | HR 64 | Temp 97.8°F | Resp 20 | Ht 70.0 in | Wt 210.5 lb

## 2014-09-03 DIAGNOSIS — E119 Type 2 diabetes mellitus without complications: Secondary | ICD-10-CM

## 2014-09-03 DIAGNOSIS — M19011 Primary osteoarthritis, right shoulder: Secondary | ICD-10-CM

## 2014-09-03 DIAGNOSIS — Z86711 Personal history of pulmonary embolism: Secondary | ICD-10-CM | POA: Insufficient documentation

## 2014-09-03 DIAGNOSIS — C159 Malignant neoplasm of esophagus, unspecified: Secondary | ICD-10-CM | POA: Insufficient documentation

## 2014-09-03 DIAGNOSIS — M5134 Other intervertebral disc degeneration, thoracic region: Secondary | ICD-10-CM

## 2014-09-03 DIAGNOSIS — Z01818 Encounter for other preprocedural examination: Secondary | ICD-10-CM | POA: Insufficient documentation

## 2014-09-03 DIAGNOSIS — M19012 Primary osteoarthritis, left shoulder: Secondary | ICD-10-CM

## 2014-09-03 DIAGNOSIS — I10 Essential (primary) hypertension: Secondary | ICD-10-CM

## 2014-09-03 LAB — COMPREHENSIVE METABOLIC PANEL
ALT: 17 U/L (ref 0–53)
AST: 23 U/L (ref 0–37)
Albumin: 3.6 g/dL (ref 3.5–5.2)
Alkaline Phosphatase: 61 U/L (ref 39–117)
Anion gap: 8 (ref 5–15)
BUN: 14 mg/dL (ref 6–23)
CO2: 21 mmol/L (ref 19–32)
Calcium: 9.1 mg/dL (ref 8.4–10.5)
Chloride: 108 mEq/L (ref 96–112)
Creatinine, Ser: 0.8 mg/dL (ref 0.50–1.35)
GFR calc Af Amer: >90 mL/min (ref >90)
GFR calc non Af Amer: >90 mL/min (ref >90)
Glucose, Bld: 97 mg/dL (ref 70–99)
Potassium: 4.4 mmol/L (ref 3.5–5.1)
Sodium: 137 mmol/L (ref 135–145)
Total Bilirubin: 0.6 mg/dL (ref 0.3–1.2)
Total Protein: 6.5 g/dL (ref 6.0–8.3)

## 2014-09-03 LAB — URINALYSIS, ROUTINE W REFLEX MICROSCOPIC
Bilirubin Urine: NEGATIVE
Glucose, UA: NEGATIVE mg/dL
Hgb urine dipstick: NEGATIVE
Ketones, ur: NEGATIVE mg/dL
Leukocytes, UA: NEGATIVE
Nitrite: NEGATIVE
Protein, ur: NEGATIVE mg/dL
Specific Gravity, Urine: 1.024 (ref 1.005–1.030)
Urobilinogen, UA: 0.2 mg/dL (ref 0.0–1.0)
pH: 5.5 (ref 5.0–8.0)

## 2014-09-03 LAB — CBC
HCT: 33.6 % — ABNORMAL LOW (ref 39.0–52.0)
Hemoglobin: 11.6 g/dL — ABNORMAL LOW (ref 13.0–17.0)
MCH: 33 pg (ref 26.0–34.0)
MCHC: 34.5 g/dL (ref 30.0–36.0)
MCV: 95.7 fL (ref 78.0–100.0)
Platelets: 212 10*3/uL (ref 150–400)
RBC: 3.51 MIL/uL — ABNORMAL LOW (ref 4.22–5.81)
RDW: 17 % — ABNORMAL HIGH (ref 11.5–15.5)
WBC: 5.7 10*3/uL (ref 4.0–10.5)

## 2014-09-03 LAB — APTT: aPTT: 34 seconds (ref 24–37)

## 2014-09-03 LAB — BLOOD GAS, ARTERIAL
Acid-base deficit: 0.7 mmol/L (ref 0.0–2.0)
Bicarbonate: 23 mEq/L (ref 20.0–24.0)
FIO2: 0.21 %
O2 Saturation: 99.3 %
Patient temperature: 98.6
TCO2: 24.1 mmol/L (ref 0–100)
pCO2 arterial: 35 mmHg (ref 35.0–45.0)
pH, Arterial: 7.433 (ref 7.350–7.450)
pO2, Arterial: 121 mmHg — ABNORMAL HIGH (ref 80.0–100.0)

## 2014-09-03 LAB — SURGICAL PCR SCREEN
MRSA, PCR: POSITIVE — AB
Staphylococcus aureus: POSITIVE — AB

## 2014-09-03 LAB — ABO/RH: ABO/RH(D): O POS

## 2014-09-03 LAB — TYPE AND SCREEN
ABO/RH(D): O POS
Antibody Screen: NEGATIVE

## 2014-09-03 LAB — PROTIME-INR
INR: 1.12 (ref 0.00–1.49)
Prothrombin Time: 14.5 seconds (ref 11.6–15.2)

## 2014-09-04 MED ORDER — DEXTROSE 5 % IV SOLN
1.5000 g | INTRAVENOUS | Status: DC
  Filled 2014-09-04: qty 1.5

## 2014-09-04 NOTE — Progress Notes (Signed)
I called a prescription for Mupirocin ointment to [facility], [place], [place].

## 2014-09-05 ENCOUNTER — Inpatient Hospital Stay (HOSPITAL_COMMUNITY): Payer: BLUE CROSS/BLUE SHIELD | Admitting: Certified Registered Nurse Anesthetist

## 2014-09-05 ENCOUNTER — Encounter (HOSPITAL_COMMUNITY)
Admission: RE | Disposition: A | Discharge status: Home / Self Care | Payer: Self-pay | Source: Ambulatory Visit | Attending: Cardiothoracic Surgery

## 2014-09-05 ENCOUNTER — Inpatient Hospital Stay (HOSPITAL_COMMUNITY): Payer: BLUE CROSS/BLUE SHIELD

## 2014-09-05 ENCOUNTER — Inpatient Hospital Stay (HOSPITAL_COMMUNITY)
Admission: RE | Admit: 2014-09-05 | Discharge: 2014-09-15 | DRG: 327 | Disposition: A | Discharge status: Home / Self Care | Payer: BLUE CROSS/BLUE SHIELD | Source: Ambulatory Visit | Attending: Cardiothoracic Surgery | Admitting: Cardiothoracic Surgery

## 2014-09-05 DIAGNOSIS — K219 Gastro-esophageal reflux disease without esophagitis: Secondary | ICD-10-CM | POA: Diagnosis present

## 2014-09-05 DIAGNOSIS — I10 Essential (primary) hypertension: Secondary | ICD-10-CM | POA: Diagnosis present

## 2014-09-05 DIAGNOSIS — Z86010 Personal history of colonic polyps: Secondary | ICD-10-CM

## 2014-09-05 DIAGNOSIS — C159 Malignant neoplasm of esophagus, unspecified: Secondary | ICD-10-CM | POA: Diagnosis present

## 2014-09-05 DIAGNOSIS — C155 Malignant neoplasm of lower third of esophagus: Principal | ICD-10-CM | POA: Diagnosis present

## 2014-09-05 DIAGNOSIS — Z923 Personal history of irradiation: Secondary | ICD-10-CM

## 2014-09-05 DIAGNOSIS — D62 Acute posthemorrhagic anemia: Secondary | ICD-10-CM | POA: Diagnosis not present

## 2014-09-05 DIAGNOSIS — C61 Malignant neoplasm of prostate: Secondary | ICD-10-CM | POA: Diagnosis present

## 2014-09-05 DIAGNOSIS — Z9049 Acquired absence of other specified parts of digestive tract: Secondary | ICD-10-CM

## 2014-09-05 DIAGNOSIS — E559 Vitamin D deficiency, unspecified: Secondary | ICD-10-CM | POA: Diagnosis present

## 2014-09-05 DIAGNOSIS — K567 Ileus, unspecified: Secondary | ICD-10-CM

## 2014-09-05 DIAGNOSIS — F17210 Nicotine dependence, cigarettes, uncomplicated: Secondary | ICD-10-CM | POA: Diagnosis present

## 2014-09-05 DIAGNOSIS — E785 Hyperlipidemia, unspecified: Secondary | ICD-10-CM | POA: Diagnosis present

## 2014-09-05 DIAGNOSIS — Z6841 Body Mass Index (BMI) 40.0 and over, adult: Secondary | ICD-10-CM | POA: Diagnosis not present

## 2014-09-05 DIAGNOSIS — Z9889 Other specified postprocedural states: Secondary | ICD-10-CM

## 2014-09-05 DIAGNOSIS — Z79899 Other long term (current) drug therapy: Secondary | ICD-10-CM

## 2014-09-05 DIAGNOSIS — J939 Pneumothorax, unspecified: Secondary | ICD-10-CM

## 2014-09-05 DIAGNOSIS — E119 Type 2 diabetes mellitus without complications: Secondary | ICD-10-CM | POA: Diagnosis present

## 2014-09-05 DIAGNOSIS — J9811 Atelectasis: Secondary | ICD-10-CM

## 2014-09-05 DIAGNOSIS — K229 Disease of esophagus, unspecified: Secondary | ICD-10-CM

## 2014-09-05 HISTORY — PX: JEJUNOSTOMY: SHX313

## 2014-09-05 HISTORY — PX: VIDEO BRONCHOSCOPY: SHX5072

## 2014-09-05 HISTORY — PX: PYLOROPLASTY: SHX418

## 2014-09-05 HISTORY — PX: COMPLETE ESOPHAGECTOMY: SHX5286

## 2014-09-05 LAB — POCT I-STAT 3, ART BLOOD GAS (G3+)
Acid-base deficit: 2 mmol/L (ref 0.0–2.0)
Bicarbonate: 22.5 mEq/L (ref 20.0–24.0)
O2 Saturation: 99 %
TCO2: 24 mmol/L (ref 0–100)
pCO2 arterial: 38.6 mmHg (ref 35.0–45.0)
pH, Arterial: 7.373 (ref 7.350–7.450)
pO2, Arterial: 136 mmHg — ABNORMAL HIGH (ref 80.0–100.0)

## 2014-09-05 LAB — CBC
HEMATOCRIT: 32.8 % — AB (ref 39.0–52.0)
Hemoglobin: 10.8 g/dL — ABNORMAL LOW (ref 13.0–17.0)
MCH: 31.2 pg (ref 26.0–34.0)
MCHC: 32.9 g/dL (ref 30.0–36.0)
MCV: 94.8 fL (ref 78.0–100.0)
Platelets: 200 10*3/uL (ref 150–400)
RBC: 3.46 MIL/uL — AB (ref 4.22–5.81)
RDW: 16.7 % — ABNORMAL HIGH (ref 11.5–15.5)
WBC: 10.7 10*3/uL — ABNORMAL HIGH (ref 4.0–10.5)

## 2014-09-05 LAB — BASIC METABOLIC PANEL
Anion gap: 8 (ref 5–15)
BUN: 8 mg/dL (ref 6–23)
CALCIUM: 8.2 mg/dL — AB (ref 8.4–10.5)
CHLORIDE: 105 meq/L (ref 96–112)
CO2: 23 mmol/L (ref 19–32)
CREATININE: 0.61 mg/dL (ref 0.50–1.35)
GFR calc Af Amer: >90 mL/min (ref >90)
GFR calc non Af Amer: >90 mL/min (ref >90)
Glucose, Bld: 185 mg/dL — ABNORMAL HIGH (ref 70–99)
POTASSIUM: 3.8 mmol/L (ref 3.5–5.1)
Sodium: 136 mmol/L (ref 135–145)

## 2014-09-05 LAB — POCT I-STAT 4, (NA,K, GLUC, HGB,HCT)
Glucose, Bld: 116 mg/dL — ABNORMAL HIGH (ref 70–99)
HCT: 28 % — ABNORMAL LOW (ref 39.0–52.0)
Hemoglobin: 9.5 g/dL — ABNORMAL LOW (ref 13.0–17.0)
Potassium: 3.4 mmol/L — ABNORMAL LOW (ref 3.5–5.1)
Sodium: 140 mmol/L (ref 135–145)

## 2014-09-05 LAB — GLUCOSE, CAPILLARY: Glucose-Capillary: 93 mg/dL (ref 70–99)

## 2014-09-05 SURGERY — ESOPHAGECTOMY, TOTAL
Anesthesia: General

## 2014-09-05 MED ORDER — ALBUMIN HUMAN 5 % IV SOLN
INTRAVENOUS | Status: DC | PRN
  Administered 2014-09-05 (×2): via INTRAVENOUS

## 2014-09-05 MED ORDER — ALBUTEROL SULFATE (2.5 MG/3ML) 0.083% IN NEBU: 2.5000 mg | INHALATION_SOLUTION | Freq: Four times a day (QID) | RESPIRATORY_TRACT | Status: AC | PRN

## 2014-09-05 MED ORDER — LACTATED RINGERS IV SOLN
INTRAVENOUS | Status: DC | PRN
  Administered 2014-09-05 (×3): via INTRAVENOUS

## 2014-09-05 MED ORDER — FENTANYL CITRATE 0.05 MG/ML IJ SOLN
INTRAMUSCULAR | Status: AC
  Filled 2014-09-05: qty 5

## 2014-09-05 MED ORDER — SODIUM CHLORIDE 0.9 % IJ SOLN
10.0000 mL | Freq: Two times a day (BID) | INTRAMUSCULAR | Status: DC
  Administered 2014-09-05 – 2014-09-10 (×12): 10 mL via INTRAVENOUS

## 2014-09-05 MED ORDER — FENTANYL CITRATE 0.05 MG/ML IJ SOLN
INTRAMUSCULAR | Status: DC | PRN
  Administered 2014-09-05: 25 ug via INTRAVENOUS
  Administered 2014-09-05: 50 ug via INTRAVENOUS
  Administered 2014-09-05: 125 ug via INTRAVENOUS
  Administered 2014-09-05: 50 ug via INTRAVENOUS
  Administered 2014-09-05: 100 ug via INTRAVENOUS
  Administered 2014-09-05 (×2): 50 ug via INTRAVENOUS
  Administered 2014-09-05: 100 ug via INTRAVENOUS
  Administered 2014-09-05: 50 ug via INTRAVENOUS
  Administered 2014-09-05: 100 ug via INTRAVENOUS
  Administered 2014-09-05: 50 ug via INTRAVENOUS
  Administered 2014-09-05: 100 ug via INTRAVENOUS
  Administered 2014-09-05: 50 ug via INTRAVENOUS
  Administered 2014-09-05: 100 ug via INTRAVENOUS
  Administered 2014-09-05 (×3): 50 ug via INTRAVENOUS
  Administered 2014-09-05: 100 ug via INTRAVENOUS

## 2014-09-05 MED ORDER — DEXTROSE-NACL 5-0.9 % IV SOLN
INTRAVENOUS | Status: DC
  Administered 2014-09-05 – 2014-09-11 (×7): via INTRAVENOUS

## 2014-09-05 MED ORDER — ROCURONIUM BROMIDE 50 MG/5ML IV SOLN
INTRAVENOUS | Status: AC
  Filled 2014-09-05: qty 2

## 2014-09-05 MED ORDER — GLYCOPYRROLATE 0.2 MG/ML IJ SOLN
INTRAMUSCULAR | Status: DC | PRN
  Administered 2014-09-05: 0.4 mg via INTRAVENOUS
  Administered 2014-09-05: 0.2 mg via INTRAVENOUS

## 2014-09-05 MED ORDER — PROPOFOL INFUSION 10 MG/ML OPTIME
INTRAVENOUS | Status: DC | PRN
  Administered 2014-09-05: 50 ug/kg/min via INTRAVENOUS

## 2014-09-05 MED ORDER — SODIUM CHLORIDE 0.9 % IJ SOLN
INTRAMUSCULAR | Status: AC
  Filled 2014-09-05: qty 10

## 2014-09-05 MED ORDER — EPHEDRINE SULFATE 50 MG/ML IJ SOLN
INTRAMUSCULAR | Status: AC
  Filled 2014-09-05: qty 1

## 2014-09-05 MED ORDER — FENTANYL CITRATE 0.05 MG/ML IJ SOLN
50.0000 ug | INTRAMUSCULAR | Status: DC | PRN
Start: 2014-09-05 — End: 2014-09-15
  Administered 2014-09-05 – 2014-09-06 (×5): 50 ug via INTRAVENOUS
  Filled 2014-09-05 (×6): qty 2

## 2014-09-05 MED ORDER — ARTIFICIAL TEARS OP OINT
TOPICAL_OINTMENT | OPHTHALMIC | Status: AC
  Filled 2014-09-05: qty 3.5

## 2014-09-05 MED ORDER — TESTOSTERONE 50 MG/5GM (1%) TD GEL: 5.0000 g | Freq: Every day | TRANSDERMAL | Status: DC

## 2014-09-05 MED ORDER — LACTATED RINGERS IV SOLN
INTRAVENOUS | Status: DC | PRN
  Administered 2014-09-05: 08:00:00 via INTRAVENOUS

## 2014-09-05 MED ORDER — DEXTROSE 5 % IV SOLN
2.0000 g | Freq: Four times a day (QID) | INTRAVENOUS | Status: AC
  Administered 2014-09-05 – 2014-09-06 (×4): 2 g via INTRAVENOUS
  Filled 2014-09-05 (×4): qty 2

## 2014-09-05 MED ORDER — LIDOCAINE HCL (CARDIAC) 20 MG/ML IV SOLN
INTRAVENOUS | Status: AC
  Filled 2014-09-05: qty 5

## 2014-09-05 MED ORDER — SUCCINYLCHOLINE CHLORIDE 20 MG/ML IJ SOLN
INTRAMUSCULAR | Status: AC
  Filled 2014-09-05: qty 1

## 2014-09-05 MED ORDER — MIDAZOLAM HCL 2 MG/2ML IJ SOLN
INTRAMUSCULAR | Status: AC
  Filled 2014-09-05: qty 2

## 2014-09-05 MED ORDER — ARTIFICIAL TEARS OP OINT
TOPICAL_OINTMENT | OPHTHALMIC | Status: DC | PRN
  Administered 2014-09-05: 1 via OPHTHALMIC

## 2014-09-05 MED ORDER — GLYCOPYRROLATE 0.2 MG/ML IJ SOLN
INTRAMUSCULAR | Status: AC
  Filled 2014-09-05: qty 1

## 2014-09-05 MED ORDER — HYDROMORPHONE HCL 1 MG/ML IJ SOLN: 0.2500 mg | INTRAMUSCULAR | Status: DC | PRN

## 2014-09-05 MED ORDER — PROPOFOL 10 MG/ML IV BOLUS
INTRAVENOUS | Status: AC
  Filled 2014-09-05: qty 20

## 2014-09-05 MED ORDER — 0.9 % SODIUM CHLORIDE (POUR BTL) OPTIME
TOPICAL | Status: DC | PRN
  Administered 2014-09-05: 2000 mL

## 2014-09-05 MED ORDER — ONDANSETRON HCL 4 MG/2ML IJ SOLN: 4.0000 mg | INTRAMUSCULAR | Status: DC | PRN

## 2014-09-05 MED ORDER — PANTOPRAZOLE SODIUM 40 MG IV SOLR
40.0000 mg | Freq: Two times a day (BID) | INTRAVENOUS | Status: DC
  Administered 2014-09-05 – 2014-09-11 (×13): 40 mg via INTRAVENOUS
  Filled 2014-09-05 (×19): qty 40

## 2014-09-05 MED ORDER — POTASSIUM CHLORIDE 10 MEQ/50ML IV SOLN: 10.0000 meq | INTRAVENOUS | Status: DC | PRN

## 2014-09-05 MED ORDER — MIDAZOLAM HCL 5 MG/5ML IJ SOLN
INTRAMUSCULAR | Status: DC | PRN
  Administered 2014-09-05 (×2): 1 mg via INTRAVENOUS

## 2014-09-05 MED ORDER — EPHEDRINE SULFATE 50 MG/ML IJ SOLN
INTRAMUSCULAR | Status: DC | PRN
  Administered 2014-09-05: 2.5 mg via INTRAVENOUS
  Administered 2014-09-05: 5 mg via INTRAVENOUS
  Administered 2014-09-05: 10 mg via INTRAVENOUS

## 2014-09-05 MED ORDER — LABETALOL HCL 5 MG/ML IV SOLN
INTRAVENOUS | Status: AC
  Filled 2014-09-05: qty 4

## 2014-09-05 MED ORDER — SODIUM CHLORIDE 0.9 % IV SOLN
10.0000 mg | INTRAVENOUS | Status: DC | PRN
  Administered 2014-09-05: 10 ug/min via INTRAVENOUS

## 2014-09-05 MED ORDER — NITROGLYCERIN 0.2 MG/ML ON CALL CATH LAB
INTRAVENOUS | Status: DC | PRN
  Administered 2014-09-05 (×2): 40 ug via INTRAVENOUS
  Administered 2014-09-05: 20 ug via INTRAVENOUS
  Administered 2014-09-05: 40 ug via INTRAVENOUS
  Administered 2014-09-05: 20 ug via INTRAVENOUS

## 2014-09-05 MED ORDER — ROCURONIUM BROMIDE 100 MG/10ML IV SOLN
INTRAVENOUS | Status: DC | PRN
  Administered 2014-09-05: 20 mg via INTRAVENOUS
  Administered 2014-09-05 (×2): 10 mg via INTRAVENOUS
  Administered 2014-09-05: 60 mg via INTRAVENOUS

## 2014-09-05 MED ORDER — CEFOXITIN SODIUM 1 G IV SOLR
1.0000 g | Freq: Four times a day (QID) | INTRAVENOUS | Status: DC
Start: 2014-09-05 — End: 2014-09-05
  Administered 2014-09-05: 1 g via INTRAVENOUS
  Filled 2014-09-05 (×6): qty 1

## 2014-09-05 MED ORDER — LIDOCAINE HCL (CARDIAC) 20 MG/ML IV SOLN
INTRAVENOUS | Status: DC | PRN
  Administered 2014-09-05: 100 mg via INTRAVENOUS

## 2014-09-05 MED ORDER — LABETALOL HCL 5 MG/ML IV SOLN
INTRAVENOUS | Status: DC | PRN
Start: 2014-09-05 — End: 2014-09-05
  Administered 2014-09-05 (×2): 5 mg via INTRAVENOUS

## 2014-09-05 MED ORDER — ACETAMINOPHEN 10 MG/ML IV SOLN
1000.0000 mg | Freq: Four times a day (QID) | INTRAVENOUS | Status: AC
  Administered 2014-09-05 – 2014-09-06 (×4): 1000 mg via INTRAVENOUS
  Filled 2014-09-05 (×4): qty 100

## 2014-09-05 MED ORDER — PROPOFOL 10 MG/ML IV BOLUS
INTRAVENOUS | Status: DC | PRN
  Administered 2014-09-05: 140 mg via INTRAVENOUS

## 2014-09-05 SURGICAL SUPPLY — 131 items
ADH SKN CLS APL DERMABOND .7 (GAUZE/BANDAGES/DRESSINGS) ×2
BANDAGE HEMOSTAT MRDH 4X4 STRL (MISCELLANEOUS) IMPLANT
BNDG HEMOSTAT MRDH 4X4 STRL (MISCELLANEOUS)
BRUSH CYTOL CELLEBRITY 1.5X140 (MISCELLANEOUS) IMPLANT
CANISTER SUCTION 2500CC (MISCELLANEOUS) ×4 IMPLANT
CATH FOLEY 2WAY SLVR 18FR 30CC (CATHETERS) ×2 IMPLANT
CATH ROBINSON RED A/P 18FR (CATHETERS) ×2 IMPLANT
CLIP FOGARTY SPRING 6M (CLIP) ×2 IMPLANT
CLIP TI LARGE 6 (CLIP) IMPLANT
CLIP TI MEDIUM 24 (CLIP) ×2 IMPLANT
CLIP TI MEDIUM 6 (CLIP) ×2 IMPLANT
CLIP TI WIDE RED SMALL 24 (CLIP) ×2 IMPLANT
CONT SPEC 4OZ CLIKSEAL STRL BL (MISCELLANEOUS) ×2 IMPLANT
CONT SPECI 4OZ STER CLIK (MISCELLANEOUS) ×2 IMPLANT
COVER SURGICAL LIGHT HANDLE (MISCELLANEOUS) ×3 IMPLANT
COVER TABLE BACK 60X90 (DRAPES) ×2 IMPLANT
DERMABOND ADVANCED (GAUZE/BANDAGES/DRESSINGS) ×2
DERMABOND ADVANCED .7 DNX12 (GAUZE/BANDAGES/DRESSINGS) ×2 IMPLANT
DRAIN PENROSE 1/2X36 STERILE (WOUND CARE) ×2 IMPLANT
DRAIN SUMP SARATOGA 24F (WOUND CARE) ×2 IMPLANT
DRAPE BILATERAL SPLIT (DRAPES) ×2 IMPLANT
DRAPE CAMERA VIDEO/LASER (DRAPES) ×2 IMPLANT
DRAPE CV SPLIT W-CLR ANES SCRN (DRAPES) ×2 IMPLANT
DRAPE INCISE IOBAN 66X45 STRL (DRAPES) ×4 IMPLANT
DRAPE PROXIMA HALF (DRAPES) ×1 IMPLANT
DRAPE SLUSH/WARMER DISC (DRAPES) ×2 IMPLANT
DRSG AQUACEL AG ADV 3.5X10 (GAUZE/BANDAGES/DRESSINGS) ×1 IMPLANT
DRSG AQUACEL AG ADV 3.5X14 (GAUZE/BANDAGES/DRESSINGS) ×2 IMPLANT
ELECT BLADE 4.0 EZ CLEAN MEGAD (MISCELLANEOUS) ×2
ELECT BLADE 6.5 EXT (BLADE) ×2 IMPLANT
ELECT NDL BLADE 2-5/6 (NEEDLE) IMPLANT
ELECT NDL TIP 2.8 STRL (NEEDLE) ×1 IMPLANT
ELECT NEEDLE BLADE 2-5/6 (NEEDLE) ×4 IMPLANT
ELECT NEEDLE TIP 2.8 STRL (NEEDLE) ×2 IMPLANT
ELECT REM PT RETURN 9FT ADLT (ELECTROSURGICAL) ×4
ELECTRODE BLDE 4.0 EZ CLN MEGD (MISCELLANEOUS) ×1 IMPLANT
ELECTRODE REM PT RTRN 9FT ADLT (ELECTROSURGICAL) ×2 IMPLANT
FORCEPS BIOP RJ4 1.8 (CUTTING FORCEPS) IMPLANT
GAUZE SPONGE 4X4 12PLY STRL (GAUZE/BANDAGES/DRESSINGS) ×2 IMPLANT
GLOVE BIO SURGEON STRL SZ 6.5 (GLOVE) ×6 IMPLANT
GOWN STRL REUS W/ TWL LRG LVL3 (GOWN DISPOSABLE) ×3 IMPLANT
GOWN STRL REUS W/TWL LRG LVL3 (GOWN DISPOSABLE) ×6
HANDLE STAPLE ENDO GIA SHORT (STAPLE) ×1
HEMOSTAT SURGICEL 2X14 (HEMOSTASIS) IMPLANT
INSERT FOGARTY 61MM (MISCELLANEOUS) IMPLANT
KIT BASIN OR (CUSTOM PROCEDURE TRAY) ×2 IMPLANT
KIT CLEAN ENDO COMPLIANCE (KITS) ×2 IMPLANT
KIT ROOM TURNOVER OR (KITS) ×2 IMPLANT
KIT SUCTION CATH 14FR (SUCTIONS) ×3 IMPLANT
LIGASURE IMPACT 36 18CM CVD LR (INSTRUMENTS) ×2 IMPLANT
LOOP VESSEL MINI RED (MISCELLANEOUS) ×1 IMPLANT
MARKER SKIN DUAL TIP RULER LAB (MISCELLANEOUS) ×2 IMPLANT
NDL BIOPSY TRANSBRONCH 21G (NEEDLE) IMPLANT
NEEDLE BIOPSY TRANSBRONCH 21G (NEEDLE) IMPLANT
NS IRRIG 1000ML POUR BTL (IV SOLUTION) ×4 IMPLANT
OIL SILICONE PENTAX (PARTS (SERVICE/REPAIRS)) ×2 IMPLANT
PACK CHEST (CUSTOM PROCEDURE TRAY) ×2 IMPLANT
PAD ARMBOARD 7.5X6 YLW CONV (MISCELLANEOUS) ×4 IMPLANT
PAD SHARPS MAGNETIC DISPOSAL (MISCELLANEOUS) ×2 IMPLANT
PENCIL BUTTON HOLSTER BLD 10FT (ELECTRODE) IMPLANT
PLUG CATH AND CAP STER (CATHETERS) ×2 IMPLANT
PROBE NERVBE PRASS .33 (MISCELLANEOUS) ×2 IMPLANT
PROBE PENCIL 8 MHZ STRL DISP (MISCELLANEOUS) ×2 IMPLANT
RELOAD EGIA 45 MED/THCK PURPLE (STAPLE) IMPLANT
RELOAD EGIA TRIS TAN 45 CVD (STAPLE) ×2 IMPLANT
RELOAD ENDO GIA 30 3.5 (STAPLE) ×2 IMPLANT
RELOAD GIA II 30 3.5 (ENDOMECHANICALS) ×2 IMPLANT
RELOAD LINEAR CUT PROX 55 BLUE (ENDOMECHANICALS) ×14 IMPLANT
RELOAD STAPLE 45 TAN MED CVD (STAPLE) IMPLANT
RELOAD STAPLE 55 3.8 BLU REG (ENDOMECHANICALS) ×3 IMPLANT
RETAINER VISCERA MED (MISCELLANEOUS) ×2 IMPLANT
RETRACTOR WND ALEXIS 25 LRG (MISCELLANEOUS) ×1 IMPLANT
RTRCTR WOUND ALEXIS 25CM LRG (MISCELLANEOUS) ×2
SCALPEL HARMONIC ACE (MISCELLANEOUS) ×2 IMPLANT
SPONGE GAUZE 4X4 12PLY STER LF (GAUZE/BANDAGES/DRESSINGS) ×2 IMPLANT
SPONGE LAP 18X18 X RAY DECT (DISPOSABLE) ×7 IMPLANT
STAPLER ENDO GIA 12 SHRT THIN (STAPLE) ×1 IMPLANT
STAPLER ENDO GIA 12MM SHORT (STAPLE) ×1 IMPLANT
STAPLER PROXIMATE 55 BLUE (STAPLE) ×2 IMPLANT
STAPLER VISISTAT 35W (STAPLE) IMPLANT
SUCTION POOLE TIP (SUCTIONS) IMPLANT
SUT ETHILON 3 0 FSL (SUTURE) ×5 IMPLANT
SUT PDS AB 4-0 SH 27 (SUTURE) IMPLANT
SUT PROLENE 0 CT 1 CR/8 (SUTURE) IMPLANT
SUT PROLENE 1 XLH (SUTURE) ×2 IMPLANT
SUT PROLENE 2 0 MH 48 (SUTURE) IMPLANT
SUT PROLENE 2 TP 1 (SUTURE) ×4 IMPLANT
SUT PROLENE 3 0 RB 1 (SUTURE) ×1 IMPLANT
SUT PROLENE 4 0 RB 1 (SUTURE) ×6
SUT PROLENE 4-0 RB1 .5 CRCL 36 (SUTURE) IMPLANT
SUT SILK  1 MH (SUTURE) ×6
SUT SILK 1 MH (SUTURE) IMPLANT
SUT SILK 1 TIES 10X30 (SUTURE) ×2 IMPLANT
SUT SILK 2 0 SH CR/8 (SUTURE) ×3 IMPLANT
SUT SILK 2 0 TIES 10X30 (SUTURE) ×1 IMPLANT
SUT SILK 2 0SH CR/8 30 (SUTURE) ×2 IMPLANT
SUT SILK 3 0 SH CR/8 (SUTURE) IMPLANT
SUT SILK 3 0SH CR/8 30 (SUTURE) IMPLANT
SUT SILK 4 0 SH CR/8 (SUTURE) ×3 IMPLANT
SUT VIC AB 1 CTX 27 (SUTURE) ×1 IMPLANT
SUT VIC AB 2-0 CT1 18 (SUTURE) IMPLANT
SUT VIC AB 2-0 CTX 36 (SUTURE) ×2 IMPLANT
SUT VIC AB 2-0 SH 18 (SUTURE) ×1 IMPLANT
SUT VIC AB 2-0 SH 27 (SUTURE) ×2
SUT VIC AB 2-0 SH 27XBRD (SUTURE) IMPLANT
SUT VIC AB 3-0 MH 27 (SUTURE) IMPLANT
SUT VIC AB 3-0 SH 18 (SUTURE) IMPLANT
SUT VIC AB 3-0 X1 27 (SUTURE) ×5 IMPLANT
SUT VIC AB 4-0 SH 18 (SUTURE) ×7 IMPLANT
SUT VICRYL 2 TP 1 (SUTURE) IMPLANT
SYR 20CC LL (SYRINGE) IMPLANT
SYR 20ML ECCENTRIC (SYRINGE) ×2 IMPLANT
SYR 30ML LL (SYRINGE) ×1 IMPLANT
SYR 30ML SLIP (SYRINGE) ×2 IMPLANT
SYR 5ML LUER SLIP (SYRINGE) ×2 IMPLANT
SYR TOOMEY 50ML (SYRINGE) ×2 IMPLANT
SYRINGE 10CC LL (SYRINGE) IMPLANT
SYSTEM SAHARA CHEST DRAIN RE-I (WOUND CARE) ×2 IMPLANT
TAPE UMBILICAL 1/8 X36 TWILL (MISCELLANEOUS) IMPLANT
TOWEL OR 17X24 6PK STRL BLUE (TOWEL DISPOSABLE) ×4 IMPLANT
TOWEL OR 17X26 10 PK STRL BLUE (TOWEL DISPOSABLE) ×4 IMPLANT
TRAP SPECIMEN MUCOUS 40CC (MISCELLANEOUS) ×2 IMPLANT
TRAY FOLEY CATH 16FRSI W/METER (SET/KITS/TRAYS/PACK) ×2 IMPLANT
TUBE CONNECTING 12X1/4 (SUCTIONS) IMPLANT
TUBE CONNECTING 20X1/4 (TUBING) ×4 IMPLANT
TUBE ENDOTRAC EMG 7X10.2 (MISCELLANEOUS) IMPLANT
TUBE ENDOTRAC EMG 8X11.3 (MISCELLANEOUS) ×1 IMPLANT
TUBE ENDOTRACH  EMG 6MMTUBE EN (MISCELLANEOUS) ×1
TUBE ENDOTRACH EMG 6MMTUBE EN (MISCELLANEOUS) IMPLANT
WATER STERILE IRR 1000ML POUR (IV SOLUTION) ×2 IMPLANT
YANKAUER SUCT BULB TIP NO VENT (SUCTIONS) ×2 IMPLANT

## 2014-09-05 NOTE — Progress Notes (Signed)
Patient ID: Joel Miller, male   DOB: [birth year], 58 y.o.   MRN: [id number] TCTS DAILY ICU PROGRESS NOTE                   [address]            [place] [address]          [phone]   Day of Surgery Procedure(s) (LRB): TRANSHIATAL TOTAL ESOPHAGECTOMY; Cervical esophagogastrostomy (N/A) VIDEO BRONCHOSCOPY (N/A) FEEDING JEJUNOSTOMY (N/A) PYLOROPLASTY (N/A)  Total Length of Stay:  LOS: 0 days   Subjective: In icu, waking up on vent, weaning  Objective: Vital signs in last 24 hours: Temp:  [96.8 F (36 C)-97.7 F (36.5 C)] 96.8 F (36 C) (12/30 1600) Pulse Rate:  [55-67] 58 (12/30 0824) Cardiac Rhythm:  [-] Normal sinus rhythm (12/30 1600) BP: (109)/(70) 109/70 mmHg (12/30 0650) SpO2:  [100 %] 100 % (12/30 0650) FiO2 (%):  [40 %-50 %] 40 % (12/30 1648)  There were no vitals filed for this visit.  Weight change:    Hemodynamic parameters for last 24 hours:    Intake/Output from previous day:    Intake/Output this shift: Total I/O In: 4391.4 [I.V.:3861.4; NG/GT:30; IV Piggyback:500] Out: 1620 [Urine:1200; Blood:400; Chest Tube:20]  Current Meds: Scheduled Meds: . acetaminophen  1,000 mg Intravenous 4 times per day  . cefOXitin  2 g Intravenous 4 times per day  . pantoprazole (PROTONIX) IV  40 mg Intravenous Q12H  . sodium chloride  10 mL Intravenous Q12H   Continuous Infusions: . dextrose 5 % and 0.9% NaCl 125 mL/hr at 09/05/14 1614   PRN Meds:.albuterol, fentaNYL, ondansetron (ZOFRAN) IV, potassium chloride    Lab Results: CBC: Recent Labs  09/03/14 1131 09/05/14 1256  WBC 5.7  --   HGB 11.6* 9.5*  HCT 33.6* 28.0*  PLT 212  --    BMET:  Recent Labs  09/03/14 1131 09/05/14 1256  NA 137 140  K 4.4 3.4*  CL 108  --   CO2 21  --   GLUCOSE 97 116*  BUN 14  --   CREATININE 0.80  --   CALCIUM 9.1  --     PT/INR:  Recent Labs  09/03/14 1131  LABPROT 14.5  INR 1.12   Radiology: Dg Chest Port 1 View  09/05/2014    CLINICAL DATA:  Status post esophagectomy for distal esophageal carcinoma.  EXAM: PORTABLE CHEST - 1 VIEW  COMPARISON:  09/03/2014  FINDINGS: Left chest tube present without evidence of pneumothorax. Endotracheal tube tip lies approximately 5 cm above the carina. Right-sided jugular central line extends into the upper SVC. Nasogastric tube present which terminates near the diaphragmatic hiatus. Lungs show low volumes without evidence of edema or consolidation. No pleural fluid is identified. The heart size and mediastinal contours are normal. No evidence of pneumomediastinum.  IMPRESSION: No pneumothorax postoperatively.   Electronically Signed   By: Aletta Edouard M.D.   On: 09/05/2014 16:07     Assessment/Plan: S/P Procedure(s) (LRB): TRANSHIATAL TOTAL ESOPHAGECTOMY; Cervical esophagogastrostomy (N/A) VIDEO BRONCHOSCOPY (N/A) FEEDING JEJUNOSTOMY (N/A) PYLOROPLASTY (N/A)  On vent, waking up weaning vent Stable      Akiyah Eppolito B 09/05/2014 5:12 PM

## 2014-09-05 NOTE — Procedures (Signed)
Extubation Procedure Note  Patient Details:   Name: Joel Miller DOB: October 03, 1955 MRN: [id number]   Airway Documentation:   Evaluation  O2 sats: stable throughout Complications: No apparent complications Patient did tolerate procedure well. Bilateral Breath Sounds: Clear, Diminished   Yes  Pt extubated without complications. NIF -40 cmH2O, VC 1.2L. Pt able to vocalize. Leak test passed prior to extubation. Pt placed on 4 lpm via [place]. IS instructed.   Prescott Parma D 09/05/2014, 5:34 PM

## 2014-09-05 NOTE — Anesthesia Preprocedure Evaluation (Addendum)
Anesthesia Evaluation  Patient identified by MRN, date of birth, ID band Patient awake    Reviewed: Allergy & Precautions, H&P , NPO status , Patient's Chart, lab work & pertinent test results  Airway Mallampati: II  TM Distance: >3 FB Neck ROM: Full    Dental no notable dental hx. (+) Teeth Intact, Dental Advisory Given   Pulmonary Current Smoker,  breath sounds clear to auscultation  Pulmonary exam normal       Cardiovascular hypertension, Pt. on medications Rhythm:Regular Rate:Normal     Neuro/Psych negative neurological ROS  negative psych ROS   GI/Hepatic Neg liver ROS, GERD-  ,Esophageal CA   Endo/Other  diabetes  Renal/GU negative Renal ROS  negative genitourinary   Musculoskeletal   Abdominal   Peds  Hematology negative hematology ROS (+)   Anesthesia Other Findings   Reproductive/Obstetrics negative OB ROS                           Anesthesia Physical Anesthesia Plan  ASA: III  Anesthesia Plan: General   Post-op Pain Management:    Induction: Intravenous  Airway Management Planned: Oral ETT  Additional Equipment: Arterial line and CVP  Intra-op Plan:   Post-operative Plan: Extubation in OR and Possible Post-op intubation/ventilation  Informed Consent: I have reviewed the patients History and Physical, chart, labs and discussed the procedure including the risks, benefits and alternatives for the proposed anesthesia with the patient or authorized representative who has indicated his/her understanding and acceptance.   Dental advisory given  Plan Discussed with: CRNA  Anesthesia Plan Comments:        Anesthesia Quick Evaluation

## 2014-09-05 NOTE — Anesthesia Postprocedure Evaluation (Signed)
  Anesthesia Post-op Note  Patient: Joel Miller  Procedure(s) Performed: Procedure(s): TRANSHIATAL TOTAL ESOPHAGECTOMY; Cervical esophagogastrostomy (N/A) VIDEO BRONCHOSCOPY (N/A) FEEDING JEJUNOSTOMY (N/A) PYLOROPLASTY (N/A)  Patient Location: ICU  Anesthesia Type:General  Level of Consciousness: sedated and unresponsive  Airway and Oxygen Therapy: Patient remains intubated and on ventilator  Post-op Pain: none  Post-op Assessment: Post-op Vital signs reviewed, Patient's Cardiovascular Status Stable and Respiratory Function Stable  Post-op Vital Signs: Reviewed  Filed Vitals:   09/05/14 0650  BP: 109/70  Pulse: 64  Temp: 70.7 C    Complications: No apparent anesthesia complications

## 2014-09-05 NOTE — H&P (Signed)
[address]       [place] [address]             [phone]                    J[facility]om J [facility] Medical Record #[id number] Date of Birth: 1956-06-25  Referring: [name], MD Primary Care: [name], MD  Chief Complaint:    Esophageal cancer  History of Present Illness:    Joel Miller 58 y.o. male is seen in the office for esophageal cancer. Patient first noted weight loss of up to 40-50 pounds over  3 months during summer. In June of 2015 he noted increasing difficulty swallowing. In September upper GI endoscopy was performed demonstrating esophageal carcinoma. The patient had no previous history of reflux, is a nonsmoker. Per report: "Starting at the gastric cardia and extending into the lower one third of the esophagus there was a circumferential, exophytic, friable tumor causing a long stricture. The 9 mm gastroscope easily traversed this area. Tumor was seen in clusters throughout the esophagus up to approximately 25 cm from the incisors." No endoscopic ultrasound has been performed  Patient has now completed 5 weekly cycles of weekly Taxol/carboplatin  And on November 11 completed course of radiotherapy. He notes he is feeling better, his eating is almost normal. His weight has stabilized   Diagnosis Esophagus, biopsy - ADENOCARCINOMA, INTESTINAL TYPE. PLEASE SEE COMMENT. HER2/NEU BY CISH - SHOWS AMPLIFICATION BY CISH ANALYSIS. RESULT RATIO OF HER2: CEP 17 SIGNALS 3.97 AVERAGE HER2 COPY NUMBER PER CELL 7.55 REFERENCE RANGE NEGATIVE HER2/Chr17 Ratio <2.0 and Average HER2 copy number <4.0 EQUIVOCAL HER2/Chr17 Ratio <2.0 and Average HER2 copy number 4.0 and <6.0 POSITIVE HER2/Chr17 Ratio >=2.0 and/or Average HER2 copy number >=6.0 Current Activity/ Functional Status:  Patient is independent with mobility/ambulation, transfers, ADL's, IADL's.   Zubrod Score: At the time of surgery this patient's most appropriate  activity status/level should be described as: [x]     0    Normal activity, no symptoms []     1    Restricted in physical strenuous activity but ambulatory, able to do out light work []     2    Ambulatory and capable of self care, unable to do work activities, up and about               >50 % of waking hours                              []     3    Only limited self care, in bed greater than 50% of waking hours []     4    Completely disabled, no self care, confined to bed or chair []     5    Moribund   Past Medical History  Diagnosis Date  . Hyperlipidemia   . Obesity     lost 80 lbs  . Hypogonadism male   . Vitamin D deficiency   . 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 Allergy     HEY FEVER  . GERD (gastroesophageal reflux disease)   . S/P radiation therapy 06/11/2014-07/18/2014  . Hypertension     no med in 30month due to weight loss  . Diabetes mellitus without complication     type II no med 60 days     Past Surgical History  Procedure Laterality Date  .  Colonoscopy    . Biospy of esphagus  05/18/14    Family History  Problem Relation Age of Onset  . Hypertension Mother   . Alzheimer's disease Mother   . Cancer Father 62    throat   patient has 3 children , one who works with epic at the oncology center , his father had a history of laryngeal cancer was a heavy smoker and ultimately succumbed to metastatic bowel cancer . His brother 2 years younger has history of prostate cancer   History   Social History  . Marital Status: Married    Spouse Name: N/A    Number of Children: 3  . Years of Education: N/A   Occupational History  . Sales Development worker, community    Social History Main Topics  . Smoking status: Current Some Day Smoker -- 1.00 packs/day for 3 years    Types: Cigars  . Smokeless tobacco: Never Used     Comment: has a cigar occasionally  . Alcohol Use: 1.0 oz/week    2 drink(s) per week     Comment: social  . Drug Use: No  . Sexual Activity: Not on  file      History  Smoking status  . Current Some Day Smoker -- 0.25 packs/day for 3 years  . Types: Cigars  Smokeless tobacco  . Never Used    Comment: has a cigar occasionally    History  Alcohol Use  . 1.0 oz/week  . 2 Not specified per week    Comment: social     Allergies  Allergen Reactions  . Ace Inhibitors     cough    Current Facility-Administered Medications  Medication Dose Route Frequency Provider Last Rate Last Dose  . cefOXitin (MEFOXIN) 1 g in dextrose 5 % 50 mL IVPB  1 g Intravenous 4 times per day [name], MD         Review of Systems:     Cardiac Review of Systems: Y or N  Chest Pain [ n   ]  Resting SOB [ n  ] Exertional SOB  [ n ]  Orthopnea [ n ]   Pedal Edema [ n  ]    Palpitations [n ] Syncope  [n  ]   Presyncope [ n  ]  General Review of Systems: [Y] = yes [  ]=no Constitional: recent weight change Blue.Reese  ];  Wt loss over the last 3 months [ 40-50  ] anorexia [ y ]; fatigue Blue.Reese  ]; nausea [  ]; night sweats [  ]; fever [  ]; or chills [  ];          Dental: poor dentition[n  ]; Last Dentist visit:   Eye : blurred vision [  ]; diplopia [   ]; vision changes [  ];  Amaurosis fugax[  ]; Resp: cough [  ];  wheezing[  ];  hemoptysis[  ]; shortness of breath[  ]; paroxysmal nocturnal dyspnea[  ]; dyspnea on exertion[  ]; or orthopnea[  ];  GI:  gallstones[n  ], vomiting[n  ];  dysphagia[ y ]; melena[  ];  hematochezia [  ]; heartburn[  ];   Hx of  Colonoscopy[y  ]; GU: kidney stones [  ]; hematuria[  ];   dysuria [  ];  nocturia[  ];  history of     obstruction [  ]; urinary frequency [  ]  Skin: rash, swelling[  ];, hair loss[  ];  peripheral edema[  ];  or itching[  ]; Musculosketetal: myalgias[  ];  joint swelling[  ];  joint erythema[  ];  joint pain[  ];  back pain[  ];  Heme/Lymph: bruising[  ];  bleeding[  ];  anemia[  ];  Neuro: TIA[  ];  headaches[  ];  stroke[  ];  vertigo[  ];  seizures[  ];   paresthesias[  ];  difficulty  walking[  ];  Psych:depression[  ]; anxiety[  ];  Endocrine: diabetes[  ];  thyroid dysfunction[  ];  Immunizations: Flu up to date [ x ]; Pneumococcal up to date [ x ];  Other: Wt Readings from Last 3 Encounters:  09/03/14 210 lb 8 oz (95.482 kg)  08/22/14 212 lb 1.6 oz (96.208 kg)  08/09/14 208 lb (94.348 kg)   Physical Exam: BP 109/70 mmHg  Pulse 64  Temp(Src) 97.7 F (36.5 C)  SpO2 100%  PHYSICAL EXAMINATION:  General appearance: alert, cooperative and appears older than stated age Neurologic: intact Heart: regular rate and rhythm, S1, S2 normal, no murmur, click, rub or gallop Lungs: clear to auscultation bilaterally Abdomen: soft, non-tender; bowel sounds normal; no masses,  no organomegaly Extremities: extremities normal, atraumatic, no cyanosis or edema and Homans sign is negative, no sign of DVT Patient has no carotid bruits, full DP and PT pulses bilaterally Patient has no cervical supraclavicular or axillary adenopathy     Diagnostic Studies & Laboratory data:     Recent Radiology Findings:  Dg Chest 2 View  09/03/2014   CLINICAL DATA:  Esophageal cancer.  Hypertension, diabetes PE  EXAM: CHEST  2 VIEW  COMPARISON:  08/09/2014  FINDINGS: Heart and mediastinal contours are within normal limits. No focal opacities or effusions. No acute bony abnormality. Degenerative changes in the thoracic spine and shoulders.  IMPRESSION: No active cardiopulmonary disease.   Electronically Signed   By: Rolm Baptise M.D.   On: 09/03/2014 12:11   Ct Chest W Contrast  08/09/2014   CLINICAL DATA:  58 year old male with history of esophageal cancer diagnosed in September 2015 status post chemotherapy and radiation therapy completed on 07/18/2014. No current complaints.  EXAM: CT CHEST, ABDOMEN, AND PELVIS WITH CONTRAST  TECHNIQUE: Multidetector CT imaging of the chest, abdomen and pelvis was performed following the standard protocol during bolus administration of intravenous contrast.   CONTRAST:  112m OMNIPAQUE IOHEXOL 300 MG/ML  SOLN  COMPARISON:  PET-CT 05/29/2014.  FINDINGS: CT CHEST FINDINGS  Mediastinum: There continues to be some mild thickening of the distal esophagus immediately above the gastroesophageal junction. The thickening of the cardia of the stomach is less apparent than the prior PET-CT. There also is some mild thickening of esophagus more proximally, shortly above the level of the carina adjacent to the aortic arch (image 19 of series 3), as demonstrated on the prior PET, and more inferiorly (image 27 of series 3), also demonstrated on the prior PET. Heart size is normal. There is no significant pericardial fluid, thickening or pericardial calcification. No pathologically enlarged mediastinal or hilar lymph nodes.  Lungs/Pleura: A few tiny 1-2 mm pulmonary nodules are seen in the periphery of the lungs bilaterally, which are highly nonspecific but unchanged compared to prior study 05/22/2014, likely areas of mucoid impaction within terminal bronchioles. No larger more suspicious appearing pulmonary nodules or masses are otherwise noted. No acute consolidative airspace disease. No pleural effusions.  Musculoskeletal: There are no aggressive appearing  lytic or blastic lesions noted in the visualized portions of the skeleton.  CT ABDOMEN AND PELVIS FINDINGS  Hepatobiliary: No cystic or solid hepatic lesions. No intra or extrahepatic biliary ductal dilatation. Gallbladder is normal in appearance.  Pancreas: Unremarkable.  Spleen: Unremarkable.  Adrenals/Urinary Tract: Bilateral adrenal glands and bilateral kidneys are normal in appearance. No hydroureteronephrosis. Urinary bladder is normal in appearance.  Stomach/Bowel: As discussed above, the thickening of the cardia of the stomach is less apparent than the prior PET-CT. The remainder of the stomach is otherwise unremarkable. No pathologic dilatation of small bowel or colon.  Vascular/Lymphatic: Atherosclerosis throughout the  abdominal and pelvic vasculature, without evidence of aneurysm or dissection. Previously noted mildly enlarged gastrohepatic ligament lymph node is smaller than the prior examination, currently measuring 9 mm in short axis (image 50 of series 3). No new lymphadenopathy is otherwise noted in the abdomen or pelvis.  Reproductive: Prostate gland and seminal vesicles are unremarkable in appearance.  Other: No significant volume of ascites.  No pneumoperitoneum.  Musculoskeletal: There are no aggressive appearing lytic or blastic lesions noted in the visualized portions of the skeleton.  IMPRESSION: 1. Today's study demonstrates a positive response to therapy. Specifically, there has been a slight decrease in soft tissue thickening in the distal esophagus, gastroesophageal junction and proximal cardia of the stomach, and regression of previously noted gastrohepatic lymphadenopathy. Other subtle areas of soft tissue thickening in the more proximal esophagus are similar to the prior study. No new sites of disease are identified in the lungs, abdomen or pelvis. 2. Mild atherosclerosis. 3. Additional incidental findings, as above.   Electronically Signed   By: Vinnie Langton M.D.   On: 08/09/2014 10:42    Ct Chest W Contrast  05/22/2014   CLINICAL DATA:  Esophageal mass.  EXAM: CT CHEST, ABDOMEN, AND PELVIS WITH CONTRAST  TECHNIQUE: Multidetector CT imaging of the chest, abdomen and pelvis was performed following the standard protocol during bolus administration of intravenous contrast.  CONTRAST:  124m OMNIPAQUE IOHEXOL 300 MG/ML  SOLN  COMPARISON:  None.  FINDINGS: CT CHEST FINDINGS  No enlarged axillary, mediastinal or hilar lymphadenopathy. Heart is normal in size. No pericardial effusion. Aorta and main pulmonary artery are normal in caliber. There is circumferential wall thickening of the distal esophagus (image 42; series 2). Multiple sub cm lymph nodes are demonstrated adjacent to the distal aspect of the  esophagus.  Central airways are patent. Multiple bilateral pulmonary nodules are demonstrated including a 3 mm nodule within the left fissure (image 27; series 3); 3 mm right lower lobe nodule (image 33; series 3); 4 mm right lower lobe nodule (image 31; series 3); 5 mm right upper lobe nodule (image 29; series 3); 4 mm right upper lobe nodule (image 12; series 3). No pleural effusion or pneumothorax.  CT ABDOMEN AND PELVIS FINDINGS  Liver is normal in size and contour. Focal fatty deposition adjacent to the falciform ligament. Gallbladder is unremarkable. Portal vein is patent. There is a 1.4 cm porta hepatic lymph node (image 58; series 2). Spleen, pancreas and bilateral adrenal glands are unremarkable. Kidneys enhance symmetrically with contrast. No hydronephrosis.  Normal caliber abdominal aorta. Peripheral noncalcified atherosclerotic plaque. Fat containing right inguinal hernia. Urinary bladder is unremarkable. Central dystrophic calcifications within the prostate.  No abnormal bowel wall thickening. No evidence for bowel obstruction. No free fluid or free intraperitoneal air. Normal appendix.  Upper abdominal mesenteric fat stranding. There is a 1.9 x 1.6 cm lymph node adjacent to the  GE junction (image 51; series 2) additionally there is a 2.4 x 1.1 cm left periaortic lymph node (image 60; series 2). Multiple additional adjacent subcentimeter retroperitoneal lymph nodes are identified.  Sclerotic lesion within the right ischium (image 124; series 2). Degenerative changes of the lower lumbar spine. Probable chronic transverse process fractures of the left L3 and L4 transverse processes. No aggressive or acute appearing osseous lesions.  IMPRESSION: 1. Circumferential wall thickening of the distal esophagus compatible with esophageal mass/adenocarcinoma. 2. Enlarged lymph node adjacent to the distal esophagus at the GE junction concerning for metastatic adenopathy. Additionally there is a prominent porta  hepatic lymph node and an enlarged left periaortic lymph node which may represent metastatic adenopathy. 3. Multiple bilateral pulmonary nodules. While these may represent sequelae of prior infectious/inflammatory process, metastatic disease is not excluded. 4. Nonspecific upper abdominal mesenteric fat stranding.   Electronically Signed   By: Lovey Newcomer M.D.   On: 05/22/2014 16:33   Nm Pet Image Initial (pi) Skull Base To Thigh  05/29/2014   CLINICAL DATA:  Initial treatment strategy for esophageal cancer of the distal third of the esophagus.  EXAM: NUCLEAR MEDICINE PET SKULL BASE TO THIGH  TECHNIQUE: 12.4 mCi F-18 FDG was injected intravenously. Full-ring PET imaging was performed from the skull base to thigh after the radiotracer. CT data was obtained and used for attenuation correction and anatomic localization.  FASTING BLOOD GLUCOSE:  Value: 104 mg/dl  COMPARISON:  CT of the chest, abdomen and pelvis 05/22/2014.  FINDINGS: NECK  No hypermetabolic lymph nodes in the neck.  CHEST  Profound thickening of the distal third of the esophagus which appears to extend just beyond the level of the gastroesophageal junction, throughout which there is diffuse hypermetabolism (SUVmax = 16.9). More proximally in the esophagus there are 2 additional areas of soft tissue thickening and hypermetabolism, one on image 76 of series 4 (SUVmax = 7.9), and the other on image 66 of series 4 (SUVmax = 7.4). The intervening at esophagus appears normal between these 3 affected regions. No definite paraesophageal hypermetabolic lymphadenopathy. No hypermetabolic adenopathy within the other portions of the mediastinal or hilar regions. No suspicious pulmonary nodules or masses. No airspace consolidation, and no pleural effusion.  ABDOMEN/PELVIS  As previously discussed, there is soft tissue thickening which extends from the distal third of the esophagus into the proximal aspect of the stomach. This area is diffusely hypermetabolic  (SUVmax = 7.9). There is a mildly enlarged gastrohepatic ligament lymph node measuring 1.2 cm in short axis which is hypermetabolic (SUVmax = 5.9). No abnormal hypermetabolic activity within the liver, pancreas, adrenal glands, or spleen. No hypermetabolic lymph nodes in the abdomen or pelvis.  SKELETON  No focal hypermetabolic activity to suggest skeletal metastasis.  IMPRESSION: 1. Extensive hypermetabolic soft tissue thickening of the distal third of the esophagus extending into the proximal stomach, compatible with a primary esophageal neoplasm. There are two additional smaller areas of hypermetabolism in the more proximal esophagus (the highest of which is at the level of the aortic arch) which are also likely foci of tumor, and there is a mildly enlarged hypermetabolic gastrohepatic ligament lymph node, presumably metastatic. No distal metastatic disease otherwise known in the neck or pelvis.   Electronically Signed   By: Vinnie Langton M.D.   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   CT scan of the chest abdomen and pelvis as followup restaging studies done with evidence of response to therapy I recommended to the patient that we proceed with transhiatal total esophagectomy and cervical esophagogastrostomy, bronchoscopy, feeding jejunostomy and pyloroplasty. The patient would like to proceed on December 30   I had a detailed discussion with Tana Conch Israelson regarding the magnitude of  the surgical esophagectomy procedure as well as the risks, the expected benefits, and alternatives.  I quoted Domnick J Aybar 5% perioperative mortality rate and a complication rate as high as 40%.  We specifically discussed complications, which include, but were not limited to: recurrent nerve injury with possible permanent hoarseness, anastomotic leak, airway and great vessel injury, conduit ischemia, thoracic duct leak, the inability to complete the operation via a transhiatal approach requiring a right thoracotomy,  bleeding, need for blood transfusion and the potential need for ventilator support.  Deja J Moring has had questions answered is well informed and willing to proceed.    Hypertension  Hyperlipidemia  T2 NIDDM  Testosterone Deficiency  Vitamin D deficiency  Malignant neoplasm of prostate  Severe obesity (BMI >= 40)  Encounter for long-term (current) use of other medications  GERD  Personal history of colonic polyps     [name] MD      [address] [place] [address] Office [phone]   Beeper [phone]  09/05/2014 7:18 AM

## 2014-09-05 NOTE — Brief Op Note (Addendum)
      [address]       [place] [address]             [phone]      09/05/2014  3:03 PM  PATIENT:  Joel Miller  58 y.o. male  PRE-OPERATIVE DIAGNOSIS:  ESOPHAGEAL CANCER  POST-OPERATIVE DIAGNOSIS:  ESOPHAGEAL CANCER  PROCEDURE:  VIDEO BRONCHOSCOPY, TRANSHIATAL TOTAL ESOPHAGECTOMY, FEEDING JEJUNOSTOMY, PYLOROPLASTY , and LEFT CHEST TUBE PLACEMENT, cervical esophagogastrostomy   SURGEON:  Surgeon(s) and Role:    * [name], MD - Primary  PHYSICIAN ASSISTANT: Lars Pinks PA-C  ANESTHESIA:   general  EBL:  Total I/O In: 3000 [I.V.:2500; IV Piggyback:500] Out: 1500 [Urine:1100; Blood:400]  BLOOD ADMINISTERED:none  DRAINS: 56 French chest tube placed in the left pleural space   SPECIMEN:  Source of Specimen:  Esophagus   DISPOSITION OF SPECIMEN:  PATHOLOGY. Margins are negative for cancer by frozen section   COUNTS CORRECT:  YES  DICTATION: .Dragon Dictation  PLAN OF CARE: Admit to inpatient   PATIENT DISPOSITION:  ICU - intubated and hemodynamically stable.   Delay start of Pharmacological VTE agent (>24hrs) due to surgical blood loss or risk of bleeding: yes

## 2014-09-05 NOTE — Transfer of Care (Signed)
Immediate Anesthesia Transfer of Care Note  Patient: Joel Miller  Procedure(s) Performed: Procedure(s): TRANSHIATAL TOTAL ESOPHAGECTOMY; Cervical esophagogastrostomy (N/A) VIDEO BRONCHOSCOPY (N/A) FEEDING JEJUNOSTOMY (N/A) PYLOROPLASTY (N/A)  Patient Location: PACU  Anesthesia Type:General  Level of Consciousness: sedated and unresponsive  Airway & Oxygen Therapy: Patient remains intubated per anesthesia plan and Patient placed on Ventilator (see vital sign flow sheet for setting)  Post-op Assessment: Report given to PACU RN and Post -op Vital signs reviewed and stable  Post vital signs: Reviewed and stable  Complications: No apparent anesthesia complications

## 2014-09-06 ENCOUNTER — Encounter (HOSPITAL_COMMUNITY): Payer: Self-pay | Admitting: Cardiothoracic Surgery

## 2014-09-06 ENCOUNTER — Inpatient Hospital Stay (HOSPITAL_COMMUNITY): Payer: BLUE CROSS/BLUE SHIELD

## 2014-09-06 LAB — BLOOD GAS, ARTERIAL
ACID-BASE DEFICIT: 1.1 mmol/L (ref 0.0–2.0)
BICARBONATE: 22.9 meq/L (ref 20.0–24.0)
DRAWN BY: 31101
O2 CONTENT: 2 L/min
O2 Saturation: 98.9 %
PATIENT TEMPERATURE: 98.6
PCO2 ART: 37.1 mmHg (ref 35.0–45.0)
TCO2: 24 mmol/L (ref 0–100)
pH, Arterial: 7.407 (ref 7.350–7.450)
pO2, Arterial: 133 mmHg — ABNORMAL HIGH (ref 80.0–100.0)

## 2014-09-06 LAB — BASIC METABOLIC PANEL
ANION GAP: 4 — AB (ref 5–15)
BUN: 7 mg/dL (ref 6–23)
CALCIUM: 7.9 mg/dL — AB (ref 8.4–10.5)
CO2: 25 mmol/L (ref 19–32)
Chloride: 104 mEq/L (ref 96–112)
Creatinine, Ser: 0.7 mg/dL (ref 0.50–1.35)
GLUCOSE: 191 mg/dL — AB (ref 70–99)
Potassium: 3.9 mmol/L (ref 3.5–5.1)
SODIUM: 133 mmol/L — AB (ref 135–145)

## 2014-09-06 LAB — POCT I-STAT 3, ART BLOOD GAS (G3+)
Acid-base deficit: 2 mmol/L (ref 0.0–2.0)
Bicarbonate: 23.8 mEq/L (ref 20.0–24.0)
O2 Saturation: 97 %
Patient temperature: 97.8
TCO2: 25 mmol/L (ref 0–100)
pCO2 arterial: 43.6 mmHg (ref 35.0–45.0)
pH, Arterial: 7.342 — ABNORMAL LOW (ref 7.350–7.450)
pO2, Arterial: 99 mmHg (ref 80.0–100.0)

## 2014-09-06 LAB — CBC
HCT: 32.8 % — ABNORMAL LOW (ref 39.0–52.0)
Hemoglobin: 10.9 g/dL — ABNORMAL LOW (ref 13.0–17.0)
MCH: 31.6 pg (ref 26.0–34.0)
MCHC: 33.2 g/dL (ref 30.0–36.0)
MCV: 95.1 fL (ref 78.0–100.0)
Platelets: 180 10*3/uL (ref 150–400)
RBC: 3.45 MIL/uL — AB (ref 4.22–5.81)
RDW: 16.5 % — ABNORMAL HIGH (ref 11.5–15.5)
WBC: 9.8 10*3/uL (ref 4.0–10.5)

## 2014-09-06 MED ORDER — ENOXAPARIN SODIUM 40 MG/0.4ML ~~LOC~~ SOLN
40.0000 mg | SUBCUTANEOUS | Status: DC
  Administered 2014-09-06 – 2014-09-14 (×9): 40 mg via SUBCUTANEOUS
  Filled 2014-09-06 (×10): qty 0.4

## 2014-09-06 MED ORDER — DIPHENHYDRAMINE HCL 50 MG/ML IJ SOLN: 12.5000 mg | Freq: Four times a day (QID) | INTRAMUSCULAR | Status: DC | PRN

## 2014-09-06 MED ORDER — ONDANSETRON HCL 4 MG/2ML IJ SOLN: 4.0000 mg | Freq: Four times a day (QID) | INTRAMUSCULAR | Status: DC | PRN

## 2014-09-06 MED ORDER — CHLORHEXIDINE GLUCONATE 0.12 % MT SOLN
15.0000 mL | Freq: Two times a day (BID) | OROMUCOSAL | Status: DC
  Administered 2014-09-06 – 2014-09-15 (×16): 15 mL via OROMUCOSAL
  Filled 2014-09-06 (×20): qty 15

## 2014-09-06 MED ORDER — FENTANYL 10 MCG/ML IV SOLN
INTRAVENOUS | Status: DC
  Administered 2014-09-06: 22:00:00 via INTRAVENOUS
  Administered 2014-09-06: 150 ug via INTRAVENOUS
  Administered 2014-09-06: 195 ug via INTRAVENOUS
  Administered 2014-09-06: 11:00:00 via INTRAVENOUS
  Administered 2014-09-07: 195 ug via INTRAVENOUS
  Administered 2014-09-07: 375 ug via INTRAVENOUS
  Administered 2014-09-07: 30 ug via INTRAVENOUS
  Administered 2014-09-07: 120 ug via INTRAVENOUS
  Administered 2014-09-07 – 2014-09-08 (×2): via INTRAVENOUS
  Administered 2014-09-08: 180 ug via INTRAVENOUS
  Administered 2014-09-08: 150 ug via INTRAVENOUS
  Administered 2014-09-08 (×2): via INTRAVENOUS
  Administered 2014-09-08: 180 ug via INTRAVENOUS
  Administered 2014-09-08: 210 ug via INTRAVENOUS
  Administered 2014-09-09: 132.9 ug via INTRAVENOUS
  Administered 2014-09-09: 0 ug via INTRAVENOUS
  Administered 2014-09-09: 120 ug via INTRAVENOUS
  Administered 2014-09-09: 90 ug via INTRAVENOUS
  Administered 2014-09-09 (×2): 60 ug via INTRAVENOUS
  Administered 2014-09-10 (×2): 0 ug via INTRAVENOUS
  Filled 2014-09-06 (×7): qty 50

## 2014-09-06 MED ORDER — MUPIROCIN CALCIUM 2 % EX CREA
TOPICAL_CREAM | Freq: Two times a day (BID) | CUTANEOUS | Status: DC
  Administered 2014-09-06 – 2014-09-10 (×9): via TOPICAL
  Administered 2014-09-10: 1 via TOPICAL
  Administered 2014-09-11 – 2014-09-15 (×9): via TOPICAL
  Filled 2014-09-06: qty 15

## 2014-09-06 MED ORDER — CHLORHEXIDINE GLUCONATE CLOTH 2 % EX PADS
6.0000 | MEDICATED_PAD | Freq: Every day | CUTANEOUS | Status: AC
  Administered 2014-09-07 – 2014-09-11 (×5): 6 via TOPICAL

## 2014-09-06 MED ORDER — CETYLPYRIDINIUM CHLORIDE 0.05 % MT LIQD
7.0000 mL | Freq: Two times a day (BID) | OROMUCOSAL | Status: DC
  Administered 2014-09-06 – 2014-09-10 (×7): 7 mL via OROMUCOSAL

## 2014-09-06 MED ORDER — SODIUM CHLORIDE 0.9 % IJ SOLN: 9.0000 mL | INTRAMUSCULAR | Status: DC | PRN

## 2014-09-06 MED ORDER — NALOXONE HCL 0.4 MG/ML IJ SOLN: 0.4000 mg | INTRAMUSCULAR | Status: DC | PRN

## 2014-09-06 MED ORDER — DIPHENHYDRAMINE HCL 12.5 MG/5ML PO ELIX
12.5000 mg | ORAL_SOLUTION | Freq: Four times a day (QID) | ORAL | Status: DC | PRN
  Filled 2014-09-06: qty 5

## 2014-09-06 NOTE — Care Management Note (Signed)
    Page 1 of 1   09/14/2014     3:54:03 PM CARE MANAGEMENT NOTE 09/14/2014  Patient:  Joel Miller, Joel Miller   Account Number:  [id number]  Date Initiated:  09/06/2014  Documentation initiated by:  MAYO,HENRIETTA  Subjective/Objective Assessment:   s/p transhiatal esophagectomy, cervical esophagogastrostomy; lives with spouse    PCP  Unk [facility]     Action/Plan:   Anticipated DC Date:  09/15/2014   Anticipated DC Plan:  [place]  CM consult      [facility] Choice  HOME HEALTH   Choice offered to / List presented to:  C-1 Patient        [place] arranged  HH-1 RN      [place].   Status of service:  Completed, signed off Medicare Important Message given?   (If response is "NO", the following Medicare IM given date fields will be blank) Date Medicare IM given:   Medicare IM given by:   Date Additional Medicare IM given:   Additional Medicare IM given by:    Discharge Disposition:  [place]  Per UR Regulation:  Reviewed for med. necessity/level of care/duration of stay  If discussed at [place] of Stay Meetings, dates discussed:    Comments:  09/14/14- 1400- Marvetta Gibbons RN, BSN [phone] Referral for [place] received- have spoken with pt and wife- who are agreeable to [facility] services- list of [facility] agencies given to pt - would like to review- will f/u for choice prior to discharge and make appropriate referral - updated-[id number]- pt has chosen [facility] for [facility] agency- referral called to [place] with [facility] for [place] [facility]- with plan for d/c 09/15/14- services to begin within 24-48 hr post discharge.

## 2014-09-06 NOTE — Progress Notes (Signed)
Patient ID: Joel Miller, male   DOB: 27-Jun-1956, 58 y.o.   MRN: [id number] TCTS DAILY ICU PROGRESS NOTE                   [address]            [place] [address]          [phone]   1 Day Post-Op Procedure(s) (LRB): TRANSHIATAL TOTAL ESOPHAGECTOMY; Cervical esophagogastrostomy (N/A) VIDEO BRONCHOSCOPY (N/A) FEEDING JEJUNOSTOMY (N/A) PYLOROPLASTY (N/A)  Total Length of Stay:  LOS: 1 day   Subjective: Alert and up to chair   Objective: Vital signs in last 24 hours: Temp:  [96.8 F (36 C)-97.8 F (36.6 C)] 97.8 F (36.6 C) (12/31 0402) Pulse Rate:  [55-88] 83 (12/31 0700) Cardiac Rhythm:  [-] Normal sinus rhythm (12/31 0000) Resp:  [8-17] 17 (12/31 0700) BP: (101-139)/(54-76) 119/67 mmHg (12/31 0700) SpO2:  [100 %] 100 % (12/31 0700) Arterial Line BP: (78-151)/(52-80) 78/68 mmHg (12/31 0400) FiO2 (%):  [40 %-50 %] 40 % (12/30 1648) Weight:  [210 lb 15.7 oz (95.7 kg)] 210 lb 15.7 oz (95.7 kg) (12/31 0500)  Filed Weights   09/06/14 0500  Weight: 210 lb 15.7 oz (95.7 kg)    Weight change:    Hemodynamic parameters for last 24 hours:    Intake/Output from previous day: 12/30 0701 - 12/31 0700 In: 6497.2 [I.V.:5457.2; NG/GT:60; IV Piggyback:950] Out: 2700 [Urine:2100; Emesis/NG output:50; Blood:400; Chest Tube:150]  Intake/Output this shift:    Current Meds: Scheduled Meds: . acetaminophen  1,000 mg Intravenous 4 times per day  . antiseptic oral rinse  7 mL Mouth Rinse q12n4p  . cefOXitin  2 g Intravenous 4 times per day  . chlorhexidine  15 mL Mouth Rinse BID  . fentaNYL   Intravenous 6 times per day  . pantoprazole (PROTONIX) IV  40 mg Intravenous Q12H  . sodium chloride  10 mL Intravenous Q12H   Continuous Infusions: . dextrose 5 % and 0.9% NaCl 125 mL/hr at 09/06/14 0500   PRN Meds:.albuterol, diphenhydrAMINE **OR** diphenhydrAMINE, fentaNYL, naloxone **AND** sodium chloride, ondansetron (ZOFRAN) IV, ondansetron (ZOFRAN) IV,  potassium chloride  General appearance: alert, cooperative and no distress Neurologic: intact Heart: regular rate and rhythm, S1, S2 normal, no murmur, click, rub or gallop Lungs: diminished breath sounds bibasilar Abdomen: no bowel sounds yet Extremities: extremities normal, atraumatic, no cyanosis or edema and Homans sign is negative, no sign of DVT Wound: dressing inatct, some serous dranage from neck drain  Lab Results: CBC: Recent Labs  09/05/14 1600 09/06/14 0425  WBC 10.7* 9.8  HGB 10.8* 10.9*  HCT 32.8* 32.8*  PLT 200 180   BMET:  Recent Labs  09/05/14 1600 09/06/14 0425  NA 136 133*  K 3.8 3.9  CL 105 104  CO2 23 25  GLUCOSE 185* 191*  BUN 8 7  CREATININE 0.61 0.70  CALCIUM 8.2* 7.9*    PT/INR:  Recent Labs  09/03/14 1131  LABPROT 14.5  INR 1.12   Radiology: Dg Chest Port 1 View  09/05/2014   CLINICAL DATA:  Status post esophagectomy for distal esophageal carcinoma.  EXAM: PORTABLE CHEST - 1 VIEW  COMPARISON:  09/03/2014  FINDINGS: Left chest tube present without evidence of pneumothorax. Endotracheal tube tip lies approximately 5 cm above the carina. Right-sided jugular central line extends into the upper SVC. Nasogastric tube present which terminates near the diaphragmatic hiatus. Lungs show low volumes without evidence of edema or consolidation. No  pleural fluid is identified. The heart size and mediastinal contours are normal. No evidence of pneumomediastinum.  IMPRESSION: No pneumothorax postoperatively.   Electronically Signed   By: Aletta Edouard M.D.   On: 09/05/2014 16:07     Assessment/Plan: S/P Procedure(s) (LRB): TRANSHIATAL TOTAL ESOPHAGECTOMY; Cervical esophagogastrostomy (N/A) VIDEO BRONCHOSCOPY (N/A) FEEDING JEJUNOSTOMY (N/A) PYLOROPLASTY (N/A) Mobilize Continue foley due to strict I&O, patient in ICU and urinary output monitoring Start pca for pain Wait until some bowel activity then start j tube feeding Left chest tube out when not  draining Expected Acute  Blood - loss Anemia     Joel Miller B 09/06/2014 8:06 AM

## 2014-09-06 NOTE — Progress Notes (Signed)
TCTS BRIEF SICU PROGRESS NOTE  1 Day Post-Op  S/P Procedure(s) (LRB): TRANSHIATAL TOTAL ESOPHAGECTOMY; Cervical esophagogastrostomy (N/A) VIDEO BRONCHOSCOPY (N/A) FEEDING JEJUNOSTOMY (N/A) PYLOROPLASTY (N/A)   Stable day Ambulated around SICU Pain adequately controlled NSR w/ stable vitals O2 sats 96% on RA UOP adequate  Plan: Continue current plan  Jarious Lyon H 09/06/2014 5:08 PM

## 2014-09-07 ENCOUNTER — Inpatient Hospital Stay (HOSPITAL_COMMUNITY): Payer: BLUE CROSS/BLUE SHIELD

## 2014-09-07 LAB — BASIC METABOLIC PANEL
Anion gap: 5 (ref 5–15)
BUN: 6 mg/dL (ref 6–23)
CO2: 27 mmol/L (ref 19–32)
Calcium: 8.3 mg/dL — ABNORMAL LOW (ref 8.4–10.5)
Chloride: 106 mEq/L (ref 96–112)
Creatinine, Ser: 0.62 mg/dL (ref 0.50–1.35)
GFR calc Af Amer: >90 mL/min (ref >90)
GFR calc non Af Amer: >90 mL/min (ref >90)
Glucose, Bld: 122 mg/dL — ABNORMAL HIGH (ref 70–99)
Potassium: 3.6 mmol/L (ref 3.5–5.1)
Sodium: 138 mmol/L (ref 135–145)

## 2014-09-07 LAB — CBC
HCT: 31.8 % — ABNORMAL LOW (ref 39.0–52.0)
Hemoglobin: 10.5 g/dL — ABNORMAL LOW (ref 13.0–17.0)
MCH: 31.9 pg (ref 26.0–34.0)
MCHC: 33 g/dL (ref 30.0–36.0)
MCV: 96.7 fL (ref 78.0–100.0)
Platelets: 183 10*3/uL (ref 150–400)
RBC: 3.29 MIL/uL — ABNORMAL LOW (ref 4.22–5.81)
RDW: 17.1 % — ABNORMAL HIGH (ref 11.5–15.5)
WBC: 8.6 10*3/uL (ref 4.0–10.5)

## 2014-09-07 MED ORDER — POTASSIUM CHLORIDE 10 MEQ/50ML IV SOLN
10.0000 meq | INTRAVENOUS | Status: AC | PRN
  Administered 2014-09-07 (×3): 10 meq via INTRAVENOUS
  Filled 2014-09-07: qty 50

## 2014-09-07 NOTE — Progress Notes (Addendum)
      [address]       [place] [address]             [phone]        CARDIOTHORACIC SURGERY PROGRESS NOTE   R2 Days Post-Op Procedure(s) (LRB): TRANSHIATAL TOTAL ESOPHAGECTOMY; Cervical esophagogastrostomy (N/A) VIDEO BRONCHOSCOPY (N/A) FEEDING JEJUNOSTOMY (N/A) PYLOROPLASTY (N/A)  Subjective: Looks good and feels well.  Mild abdominal discomfort.  Already ambulated around SICU  Objective: Vital signs: BP Readings from Last 1 Encounters:  09/07/14 133/71   Pulse Readings from Last 1 Encounters:  09/07/14 94   Resp Readings from Last 1 Encounters:  09/07/14 16   Temp Readings from Last 1 Encounters:  09/07/14 96 F (35.6 C) Oral    Hemodynamics:    Physical Exam:  Rhythm:   sinus  Breath sounds: clear  Heart sounds:  RRR  Incisions:  Dressings dry, intact  Abdomen:  Soft, non-distended, non-tender, no bowel sounds  Extremities:  Warm, well-perfused  Chest tube:  Thin serous output   Intake/Output from previous day: 12/31 0701 - 01/01 0700 In: 2215 [I.V.:1865; NG/GT:90; IV Piggyback:200] Out: 2180 [Urine:1740; Emesis/NG output:170; Chest Tube:270] Intake/Output this shift: Total I/O In: 125 [I.V.:75; IV Piggyback:50] Out: 200 [Urine:100; Chest Tube:100]  Lab Results:  CBC: Recent Labs  09/06/14 0425 09/07/14 0432  WBC 9.8 8.6  HGB 10.9* 10.5*  HCT 32.8* 31.8*  PLT 180 183    BMET:  Recent Labs  09/06/14 0425 09/07/14 0432  NA 133* 138  K 3.9 3.6  CL 104 106  CO2 25 27  GLUCOSE 191* 122*  BUN 7 6  CREATININE 0.70 0.62  CALCIUM 7.9* 8.3*     CBG (last 3)   Recent Labs  09/05/14 0700  GLUCAP 93    ABG    Component Value Date/Time   PHART 7.342* 09/06/2014 0413   PCO2ART 43.6 09/06/2014 0413   PO2ART 99.0 09/06/2014 0413   HCO3 23.8 09/06/2014 0413   TCO2 25 09/06/2014 0413   ACIDBASEDEF 2.0 09/06/2014 0413   O2SAT 97.0 09/06/2014 0413    CXR: PORTABLE CHEST - 1 VIEW  COMPARISON:  09/06/2014  FINDINGS: Left basilar chest tube, right central line and NG tube remain in place. The tip of the NG tube is at the GE junction. No pneumothorax. There does appear to be free air under the right hemidiaphragm, similar to prior study.  Low lung volumes. Improving aeration in the bases with decreasing atelectasis. No visible effusions.  IMPRESSION: Decreasing bibasilar atelectasis.  Air noted under the right hemidiaphragm, stable.   Electronically Signed  By: Rolm Baptise M.D.  On: 09/07/2014 07:41  Assessment/Plan: S/P Procedure(s) (LRB): TRANSHIATAL TOTAL ESOPHAGECTOMY; Cervical esophagogastrostomy (N/A) VIDEO BRONCHOSCOPY (N/A) FEEDING JEJUNOSTOMY (N/A) PYLOROPLASTY (N/A)  Doing well POD2 Expected post op ileus, no signs of bowel function yet Expected post op acute blood loss anemia, mild, stable Expected post op volume excess, mild, UOP adequate   Mobilize  D/C foley  Keep chest tube until output decreased  Keep NG tube  Keep NPO  Hold tube feeds for now  [facility] H 09/07/2014 8:38 AM

## 2014-09-07 NOTE — Op Note (Signed)
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  SURGICAL PROCEDURE:  Video bronchoscopy, transhiatal total esophagectomy with cervical esophagogastrostomy, pyloroplasty, and feeding jejunostomy.  SURGEON:  [name], MD  FIRST ASSISTANT:  Lars Pinks, PA.  BRIEF HISTORY:  The patient is a 59 year old male, who has undergone radiation therapy and chemotherapy for esophageal carcinoma, locally advanced, involving the distal third of the esophagus.  Post treatment scans demonstrated no evidence of distant metastasis.  Risks and options were discussed with the patient concerning esophagectomy, and it was recommended to him to proceed with total esophagectomy.  Risks and options were explained in detail to the patient.  He agreed and signed informed consent.  DESCRIPTION OF PROCEDURE:  The patient underwent general endotracheal anesthesia without incident.  A NIMS tube was placed through the endotracheal tube, and after appropriate time-out was performed, a fiberoptic bronchoscopy was performed.  The endotracheal tube was in a good position.  There was no evidence of endobronchial lesions specifically along the posterior wall of the trachea.  The scope was removed.  The patient's neck was turned slightly to the right.  The left neck, chest, and abdomen were prepped with Betadine and draped in sterile manner.  We initially proceeded with upper midline laparotomy incision and the abdomen was explored.  The distal esophagus in the abdomen was palpated and  thickened.  There were no obvious abdominal lymph nodes palpable or pathologically enlarged.  We then proceeded with planned resection.  Using the thermal energy device, the short gastric vessels were divided freeing up the greater curve of the stomach.  This was carried to the esophageal hiatus.  The esophageal hiatus was opened identifying __________.  There was no obvious invasion of the tumor into the periesophageal tissue.  A Penrose drain was placed around the distal esophagus.  We then continued elevating the stomach and entering the lesser sac taking care to preserve the right gastroepiploic vessel.  The left gastric artery and vein were identified and encircled with a vessel loop, and bulldog clamp was placed on the left gastric artery.  With this in place, there was still easy palpable pulse in the right gastroepiploic.  We then proceeded with cauterizing the duodenum and taking down some adhesions to the gallbladder.  The gallbladder itself appeared normal.  There were no palpable stones.  The duodenum was cauterized enough to allow mobility of the pyloric area to move superiorly within the procedure with transhiatal esophagectomy bluntly dissecting the distal esophagus and using direct vision with the Endo Harmonic scalpel to divide the larger band and tissue.  As we got to the midportion of the esophagus and vicinity of the trachea, we then went to the left neck, incision was made along the sternocleidomastoid muscle, and dissection  carried down retracting the sternocleidomastoid muscle, carotid artery, and jugular vein laterally entering the posterior esophageal space along the prevertebral fascia.  We then carefully dissected circumferentially around the esophagus using the NIMS device to confirm the location of the recurrent nerve.  Penrose drain was placed around the cervical esophagus, and working from above and below, the remainder of the esophagus was freed up.  An NG  tube which had been in place, was retracted up to the neck.  A GIA stapler was placed across the cervical esophagus and the specimen then was removed from the posterior mediastinum to the abdomen.  We then used a GIA stapler with serial firings of 55 stapler to create a gastric tube taking care to ensure adequate margins along the GE junction.  The staple line was then reinforced with a running 4-0 Prolene suture.  The specimen was then sent to pathology for frozen section, and the proximal and distal margins which were reported back to [place] as clear of tumor.  We proceeded with a standard pyloroplasty dividing the pyloric muscle and re-closing the pyloroplasty with interrupted 4-0 Vicryl sutures on the mucosal layer and interrupted 4-0 silk sutures as the second layer.  The left gastric artery which had previously been identified, the bulldog was removed, and the left gastric artery and vein were then divided with a vascular stapler.  The fundus of the stomach was then marked approximately a centimeter and a half from the tip as the site of our cervical anastomosis.  Using a Foley catheter balloon and a telescope bag, the gastric tube was delivered to the cervical incision without difficulty.  After that, we confirmed that the gastric tube had not been twisted and the site of the anastomosis was in a proper position.  The previous staple line and the cervical esophagus was then excised and the site of the gastric anastomosis was opened.  The mucosa of both of this esophagus and stomach appeared viable with good blood flow.  At 30 cm, Endo GIA stapler was placed with 1 arm into the gastric tube and 1 into the esophagus after the mucosal edges had been approximated with interrupted 4-0 Vicryl sutures.  The mucosal surface along with staple anastomosis were approximated with silk sutures to relieve any tension on the anastomosis.  The posterior wall of the anastomosis was created by firing  stapler.  The anterior wall was then closed manually with a double layer of Vicryl suture and 4-0 silk suture.  Prior to closure of the anterior wall, the NG tube was then repositioned into the gastric tube down to the abdomen.  The anterior wall of the anastomosis was then closed as noted, and the anastomosis positioned into the posterior upper mediastinal and cervical space.  A Penrose drain brought out through a separate site at the left neck was placed in the vicinity of the anastomosis.  The neck incision was then closed with interrupted 3-0 Vicryl sutures in the muscle layers and running 3-0 subcuticular stitch in the skin edge.  The hiatus was then tacked to the gastric tube to prevent any herniation of the bowel into the mediastinum or chest.  A left chest tube was left in place.  The proximal jejunum was identified and a red rubber catheter with the tip cut off was __________ in place into the small bowel and brought out through the left abdominal wall. The small bowel was tacked to the abdominal wall to prevent any twisting or internal herniation with operative field  hemostatic.  Abdomen was irrigated.  The abdominal fascia was closed with a running #2 Prolene suture.  Subcutaneous tissue was closed with running 2-0 Vicryl, and skin staples on the skin edges.  Dry dressings were applied.  Sponge and needle count was reported as correct at the completion of procedure. The total blood loss was estimated between 300 mL and 400 mL.  The patient was transferred to the Surgical Intensive Care Unit for further postoperative care, having tolerated the procedure without obvious complication.     [name], MD     EG/MEDQ  D:  09/07/2014  T:  09/07/2014  Job:  [id number]

## 2014-09-07 NOTE — Progress Notes (Signed)
Administered 140mcg Fentanyl via PCA. Cleared at 1315. Patient A&O, VSS, EtCO2 39, RR 13, c/o pain 4/10. PCA encouraged. IS 800. Will continue to monitor. Richardean Sale, RN

## 2014-09-08 LAB — BASIC METABOLIC PANEL
Anion gap: 7 (ref 5–15)
BUN: 6 mg/dL (ref 6–23)
CO2: 26 mmol/L (ref 19–32)
Calcium: 8.3 mg/dL — ABNORMAL LOW (ref 8.4–10.5)
Chloride: 106 mEq/L (ref 96–112)
Creatinine, Ser: 0.65 mg/dL (ref 0.50–1.35)
GLUCOSE: 110 mg/dL — AB (ref 70–99)
POTASSIUM: 3.5 mmol/L (ref 3.5–5.1)
SODIUM: 139 mmol/L (ref 135–145)

## 2014-09-08 LAB — CBC
HEMATOCRIT: 30.9 % — AB (ref 39.0–52.0)
Hemoglobin: 10.2 g/dL — ABNORMAL LOW (ref 13.0–17.0)
MCH: 32.7 pg (ref 26.0–34.0)
MCHC: 33 g/dL (ref 30.0–36.0)
MCV: 99 fL (ref 78.0–100.0)
Platelets: 180 10*3/uL (ref 150–400)
RBC: 3.12 MIL/uL — ABNORMAL LOW (ref 4.22–5.81)
RDW: 16.9 % — ABNORMAL HIGH (ref 11.5–15.5)
WBC: 6.2 10*3/uL (ref 4.0–10.5)

## 2014-09-08 MED ORDER — PHENOL 1.4 % MT LIQD
1.0000 | OROMUCOSAL | Status: DC | PRN
  Administered 2014-09-08: 1 via OROMUCOSAL
  Filled 2014-09-08: qty 177

## 2014-09-08 MED ORDER — VITAL AF 1.2 CAL PO LIQD
1000.0000 mL | ORAL | Status: DC
  Administered 2014-09-08: 1000 mL
  Filled 2014-09-08 (×2): qty 1000

## 2014-09-08 MED ORDER — POTASSIUM CHLORIDE 10 MEQ/50ML IV SOLN
10.0000 meq | INTRAVENOUS | Status: AC
  Administered 2014-09-08 (×3): 10 meq via INTRAVENOUS
  Filled 2014-09-08 (×2): qty 50

## 2014-09-08 MED ORDER — POTASSIUM CHLORIDE 10 MEQ/50ML IV SOLN
10.0000 meq | INTRAVENOUS | Status: AC
  Administered 2014-09-08 (×3): 10 meq via INTRAVENOUS

## 2014-09-08 MED ORDER — POTASSIUM CHLORIDE 10 MEQ/50ML IV SOLN
INTRAVENOUS | Status: AC
  Filled 2014-09-08: qty 150

## 2014-09-08 NOTE — Progress Notes (Addendum)
INITIAL NUTRITION ASSESSMENT  DOCUMENTATION CODES Per approved criteria  -Severe malnutrition in the context of chronic illness   INTERVENTION: Pt to start trickle feeds: Vital 1.2 @ 20 ml/hr via J-tube.   When pt pt is ready to advance enteral feeding, recommend  increase by 10 ml every 6 hours to goal rate of  75 ml/hr. Tube feeding regimen will  Provide 2160  kcal (96% of energy and 100 % protein needs), 135 grams of protein, and 1459 ml of H2O.   NUTRITION DIAGNOSIS: Inadequate oral intake related to esophageal cancer as evidenced by PMH, including malnutrition, and inability to meet nutrition requirements orally.   Goal: Pt will tolerate enteral feeding to meet estimated nutrition needs   Monitor:  Nutrition support measures, swallow study results, weights and labs  Reason for Assessment: Malnutrition screen and new tube feeding  59 y.o. male  ASSESSMENT: Pt is s/pjejunostomy, Pyloroplasty, transhiatal total esophagecteomy. He is post op day 3 and has been cleared to start trickle tube feeds. Has NGT (low intermittent suction) in place and upper GI swallow study pending for next week.   Pt receiving Vital 1.2 @ 20 ml/hr (provides 576 kcal, 36 gr protein and 389 ml water which is meeting 25% minimum est energy and  32% protein needs).  Pt has hx of severe malnutrition (diagnosed 06/01/14). Followed by [facility]. RD note on 07/09/14-following pt who is receiving chemotherapy for distal gastroesophageal cancer.Today is his last chemotherapy. His final radiation therapy is scheduled for November 11. His weight continues to decline and was documented as 224.6 pounds November 2, down from 228.7 pounds October 26 patient.Weight 250.1 pounds at the start of treatment.  Height: Ht Readings from Last 1 Encounters:  09/06/14 5\' 10"  (1.778 m)    Weight: Wt Readings from Last 1 Encounters:  09/08/14 209 lb 3.5 oz (94.9 kg)    Ideal Body Weight: 166# (75 kg)  % Ideal Body  Weight: 126%  Wt Readings from Last 10 Encounters:  09/08/14 209 lb 3.5 oz (94.9 kg)  09/03/14 210 lb 8 oz (95.482 kg)  08/22/14 212 lb 1.6 oz (96.208 kg)  08/09/14 208 lb (94.348 kg)  07/26/14 210 lb 9.6 oz (95.528 kg)  07/19/14 214 lb (97.07 kg)  07/16/14 218 lb 4.8 oz (99.02 kg)  07/09/14 224 lb 9.6 oz (101.878 kg)  07/02/14 228 lb 11.2 oz (103.738 kg)  06/25/14 232 lb 9.6 oz (105.507 kg)    Usual Body Weight: 250#  % Usual Body Weight: 84%  BMI:  Body mass index is 30.02 kg/(m^2). obesity class I  Estimated Nutritional Needs: Kcal: 2250-2475 Protein: 113-128 gr Fluid: 2.3-2.5 liters daily  Skin: closed incisions and open drain to neck12/30/15  Diet Order: Diet NPO time specified  EDUCATION NEEDS: -No education needs identified at this time   Intake/Output Summary (Last 24 hours) at 09/08/14 1131 Last data filed at 09/08/14 1000  Gross per 24 hour  Intake   1905 ml  Output   1395 ml  Net    510 ml    Last BM: PTA   Labs:   Recent Labs Lab 09/06/14 0425 09/07/14 0432 09/08/14 0430  NA 133* 138 139  K 3.9 3.6 3.5  CL 104 106 106  CO2 25 27 26   BUN 7 6 6   CREATININE 0.70 0.62 0.65  CALCIUM 7.9* 8.3* 8.3*  GLUCOSE 191* 122* 110*    CBG (last 3)  No results for input(s): GLUCAP in the last 72 hours.  Scheduled Meds: . antiseptic oral rinse  7 mL Mouth Rinse q12n4p  . chlorhexidine  15 mL Mouth Rinse BID  . Chlorhexidine Gluconate Cloth  6 each Topical Q0600  . enoxaparin (LOVENOX) injection  40 mg Subcutaneous Q24H  . fentaNYL   Intravenous 6 times per day  . mupirocin cream   Topical BID  . pantoprazole (PROTONIX) IV  40 mg Intravenous Q12H  . potassium chloride  10 mEq Intravenous Q1 Hr x 3  . potassium chloride      . sodium chloride  10 mL Intravenous Q12H    Continuous Infusions: . dextrose 5 % and 0.9% NaCl 75 mL/hr at 09/08/14 1000  . feeding supplement (VITAL AF 1.2 CAL)      Past Medical History  Diagnosis Date  .  Hyperlipidemia   . Obesity     lost 80 lbs  . Hypogonadism male   . Vitamin D deficiency   . 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 Allergy     HEY FEVER  . GERD (gastroesophageal reflux disease)   . S/P radiation therapy 06/11/2014-07/18/2014  . Hypertension     no med in 66months due to weight loss  . Diabetes mellitus without complication     type II no med 60 days     Past Surgical History  Procedure Laterality Date  . Colonoscopy    . 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 Complete esophagectomy N/A 09/05/2014    Procedure: TRANSHIATAL TOTAL ESOPHAGECTOMY; Cervical esophagogastrostomy;  Surgeon: [name], MD;  Location: [facility];  Service: Thoracic;  Laterality: N/A;  . Video bronchoscopy N/A 09/05/2014    Procedure: VIDEO BRONCHOSCOPY;  Surgeon: [name], MD;  Location: [facility];  Service: Thoracic;  Laterality: N/A;  . Jejunostomy N/A 09/05/2014    Procedure: FEEDING JEJUNOSTOMY;  Surgeon: [name], MD;  Location: [facility];  Service: Thoracic;  Laterality: N/A;  . Pyloroplasty N/A 09/05/2014    Procedure: PYLOROPLASTY;  Surgeon: [name], MD;  Location: [facility];  Service: Thoracic;  Laterality: N/A;    Colman Cater MS,RD,CSG,LDN Office: [phone] Pager: [phone]

## 2014-09-08 NOTE — Progress Notes (Signed)
      [address]       [place] [address]             [phone]        CARDIOTHORACIC SURGERY PROGRESS NOTE   R3 Days Post-Op Procedure(s) (LRB): TRANSHIATAL TOTAL ESOPHAGECTOMY; Cervical esophagogastrostomy (N/A) VIDEO BRONCHOSCOPY (N/A) FEEDING JEJUNOSTOMY (N/A) PYLOROPLASTY (N/A)  Subjective: Feels well.  Soreness in stomach improving.  Has felt his stomach grumbling a little but no flatus.  Biggest complaint is dry throat.    Objective: Vital signs: BP Readings from Last 1 Encounters:  09/08/14 125/77   Pulse Readings from Last 1 Encounters:  09/08/14 82   Resp Readings from Last 1 Encounters:  09/08/14 12   Temp Readings from Last 1 Encounters:  09/08/14 97.8 F (36.6 C) Oral    Hemodynamics:    Physical Exam:  Rhythm:   sinus  Breath sounds: clear  Heart sounds:  RRR  Incisions:  Clean and dry  Abdomen:  Soft, non-distended, non-tender, + few bowel sounds  Extremities:  Warm, well-perfused  Chest tube:  Decreased serous output    Intake/Output from previous day: 01/01 0701 - 01/02 0700 In: 1930 [I.V.:1800; IV Piggyback:100] Out: 1445 [Urine:975; Emesis/NG output:180; Chest Tube:290] Intake/Output this shift: Total I/O In: 325 [I.V.:225; IV Piggyback:100] Out: 425 [Urine:425]  Lab Results:  CBC: Recent Labs  09/07/14 0432 09/08/14 0430  WBC 8.6 6.2  HGB 10.5* 10.2*  HCT 31.8* 30.9*  PLT 183 180    BMET:  Recent Labs  09/07/14 0432 09/08/14 0430  NA 138 139  K 3.6 3.5  CL 106 106  CO2 27 26  GLUCOSE 122* 110*  BUN 6 6  CREATININE 0.62 0.65  CALCIUM 8.3* 8.3*     CBG (last 3)  No results for input(s): GLUCAP in the last 72 hours.  ABG    Component Value Date/Time   PHART 7.342* 09/06/2014 0413   PCO2ART 43.6 09/06/2014 0413   PO2ART 99.0 09/06/2014 0413   HCO3 23.8 09/06/2014 0413   TCO2 25 09/06/2014 0413   ACIDBASEDEF 2.0 09/06/2014 0413   O2SAT 97.0 09/06/2014 0413     CXR: n/a  Assessment/Plan: S/P Procedure(s) (LRB): TRANSHIATAL TOTAL ESOPHAGECTOMY; Cervical esophagogastrostomy (N/A) VIDEO BRONCHOSCOPY (N/A) FEEDING JEJUNOSTOMY (N/A) PYLOROPLASTY (N/A)  Doing well POD3 Expected post op ileus, + some bowel sounds Expected post op acute blood loss anemia, mild, stable Expected post op volume excess, mild, UOP adequate Chest tube output decreased   Mobilize  D/C chest tube  Keep NG tube for now but would not replace if it comes out  Keep NPO  Start trickle tube feeds  Transfer step down  Gastrograffin upper GI swallow study on Tuesday or Wednesday next week  Javarie Crisp H 09/08/2014 11:07 AM

## 2014-09-09 ENCOUNTER — Inpatient Hospital Stay (HOSPITAL_COMMUNITY): Payer: BLUE CROSS/BLUE SHIELD

## 2014-09-09 LAB — BASIC METABOLIC PANEL
Anion gap: 6 (ref 5–15)
BUN: 6 mg/dL (ref 6–23)
CALCIUM: 8.6 mg/dL (ref 8.4–10.5)
CHLORIDE: 103 meq/L (ref 96–112)
CO2: 28 mmol/L (ref 19–32)
Creatinine, Ser: 0.69 mg/dL (ref 0.50–1.35)
GFR calc Af Amer: >90 mL/min (ref >90)
GFR calc non Af Amer: >90 mL/min (ref >90)
Glucose, Bld: 128 mg/dL — ABNORMAL HIGH (ref 70–99)
Potassium: 3.5 mmol/L (ref 3.5–5.1)
SODIUM: 137 mmol/L (ref 135–145)

## 2014-09-09 MED ORDER — POTASSIUM CHLORIDE 10 MEQ/50ML IV SOLN
10.0000 meq | INTRAVENOUS | Status: AC
  Administered 2014-09-09 (×3): 10 meq via INTRAVENOUS
  Filled 2014-09-09: qty 50

## 2014-09-09 MED ORDER — VITAL AF 1.2 CAL PO LIQD
1000.0000 mL | ORAL | Status: DC
Start: 2014-09-09 — End: 2014-09-10
  Administered 2014-09-10: 1000 mL
  Filled 2014-09-09 (×3): qty 1000

## 2014-09-09 MED ORDER — POTASSIUM CHLORIDE 10 MEQ/50ML IV SOLN
10.0000 meq | INTRAVENOUS | Status: AC
  Administered 2014-09-09 (×3): 10 meq via INTRAVENOUS
  Filled 2014-09-09 (×3): qty 50

## 2014-09-09 MED ORDER — SODIUM CHLORIDE 0.9 % IJ SOLN
10.0000 mL | INTRAMUSCULAR | Status: DC | PRN
  Administered 2014-09-10 (×2): 10 mL
  Filled 2014-09-09 (×2): qty 40

## 2014-09-09 NOTE — Progress Notes (Signed)
      [address]       [place] [address]             [phone]        CARDIOTHORACIC SURGERY PROGRESS NOTE   R4 Days Post-Op Procedure(s) (LRB): TRANSHIATAL TOTAL ESOPHAGECTOMY; Cervical esophagogastrostomy (N/A) VIDEO BRONCHOSCOPY (N/A) FEEDING JEJUNOSTOMY (N/A) PYLOROPLASTY (N/A)  Subjective: Feels a little better.  Throat sore from NG tube.  Starting to feel his stomach rumbling but no flatus yet.  Pain in abdomen slowly improving  Objective: Vital signs: BP Readings from Last 1 Encounters:  09/09/14 138/86   Pulse Readings from Last 1 Encounters:  09/09/14 83   Resp Readings from Last 1 Encounters:  09/09/14 15   Temp Readings from Last 1 Encounters:  09/09/14 98.3 F (36.8 C) Oral    Hemodynamics:    Physical Exam:  Rhythm:   sinus  Breath sounds: clear  Heart sounds:  RRR  Incisions:  Clean and dry  Abdomen:  Soft, non-distended, non-tender, + BS's  Extremities:  Warm, well-perfused    Intake/Output from previous day: 01/02 0701 - 01/03 0700 In: 1947.3 [I.V.:1326.3; NG/GT:291; IV Piggyback:300] Out: 2175 [Urine:2125; Chest Tube:50] Intake/Output this shift: Total I/O In: 310 [I.V.:150; NG/GT:60; IV Piggyback:100] Out: 250 [Urine:250]  Lab Results:  CBC: Recent Labs  09/07/14 0432 09/08/14 0430  WBC 8.6 6.2  HGB 10.5* 10.2*  HCT 31.8* 30.9*  PLT 183 180    BMET:  Recent Labs  09/08/14 0430 09/09/14 0400  NA 139 137  K 3.5 3.5  CL 106 103  CO2 26 28  GLUCOSE 110* 128*  BUN 6 6  CREATININE 0.65 0.69  CALCIUM 8.3* 8.6     CBG (last 3)  No results for input(s): GLUCAP in the last 72 hours.  ABG    Component Value Date/Time   PHART 7.342* 09/06/2014 0413   PCO2ART 43.6 09/06/2014 0413   PO2ART 99.0 09/06/2014 0413   HCO3 23.8 09/06/2014 0413   TCO2 25 09/06/2014 0413   ACIDBASEDEF 2.0 09/06/2014 0413   O2SAT 97.0 09/06/2014 0413    CXR: PORTABLE CHEST - 1 VIEW  COMPARISON:  09/07/2014  FINDINGS: Nasogastric tube terminates at the proximal stomach, poorly visualized distally. Right internal jugular line unchanged with tip at mid to low SVC.  Cardiomegaly accentuated by AP portable technique. No pleural effusion or pneumothorax. Removal of left-sided chest tube. Radiopaque object projecting over the left lung apex could be external to the patient.  Slight increase in right base atelectasis. Free intraperitoneal air, not significantly changed.  IMPRESSION: Slight increase in right base Airspace disease, likely atelectasis.  Removal left chest tube, without pneumothorax.  Similar free intraperitoneal air under the right hemidiaphragm. Please see report of 09/06/2014.  Nasogastric tube terminating at the proximal stomach. Correlate with desired position.   Electronically Signed  By: Abigail Miyamoto M.D.  On: 09/09/2014 07:26   Assessment/Plan: S/P Procedure(s) (LRB): TRANSHIATAL TOTAL ESOPHAGECTOMY; Cervical esophagogastrostomy (N/A) VIDEO BRONCHOSCOPY (N/A) FEEDING JEJUNOSTOMY (N/A) PYLOROPLASTY (N/A)  Overall stable POD4 Expected post op ileus slowly resolving Expected post op acute blood loss anemia, mild, stable Expected post op volume excess, mild, resolving Expected post op atelectasis, mild, stable   D/C NG tube today  Increase tube feeds to 30 mL/hr  Mobilize  Awaiting bed on step down unit for transfer  Gastrograffin upper GI swallow study on Tuesday or Wednesday next week  OWEN,CLARENCE H 09/09/2014 10:41 AM

## 2014-09-10 ENCOUNTER — Inpatient Hospital Stay (HOSPITAL_COMMUNITY): Payer: BLUE CROSS/BLUE SHIELD

## 2014-09-10 LAB — BASIC METABOLIC PANEL
ANION GAP: 4 — AB (ref 5–15)
BUN: 7 mg/dL (ref 6–23)
CHLORIDE: 106 meq/L (ref 96–112)
CO2: 30 mmol/L (ref 19–32)
Calcium: 8.8 mg/dL (ref 8.4–10.5)
Creatinine, Ser: 0.71 mg/dL (ref 0.50–1.35)
GFR calc non Af Amer: >90 mL/min (ref >90)
Glucose, Bld: 125 mg/dL — ABNORMAL HIGH (ref 70–99)
Potassium: 3.7 mmol/L (ref 3.5–5.1)
Sodium: 140 mmol/L (ref 135–145)

## 2014-09-10 LAB — CBC
HEMATOCRIT: 31.8 % — AB (ref 39.0–52.0)
HEMOGLOBIN: 10.5 g/dL — AB (ref 13.0–17.0)
MCH: 31.2 pg (ref 26.0–34.0)
MCHC: 33 g/dL (ref 30.0–36.0)
MCV: 94.4 fL (ref 78.0–100.0)
Platelets: 203 10*3/uL (ref 150–400)
RBC: 3.37 MIL/uL — ABNORMAL LOW (ref 4.22–5.81)
RDW: 15.4 % (ref 11.5–15.5)
WBC: 4.7 10*3/uL (ref 4.0–10.5)

## 2014-09-10 LAB — GLUCOSE, CAPILLARY: Glucose-Capillary: 132 mg/dL — ABNORMAL HIGH (ref 70–99)

## 2014-09-10 MED ORDER — VITAL AF 1.2 CAL PO LIQD
1000.0000 mL | ORAL | Status: DC
Start: 2014-09-10 — End: 2014-09-10
  Filled 2014-09-10 (×2): qty 1000

## 2014-09-10 MED ORDER — OXYCODONE HCL 5 MG/5ML PO SOLN
5.0000 mg | ORAL | Status: DC | PRN
Start: 2014-09-10 — End: 2014-09-15
  Administered 2014-09-11 – 2014-09-15 (×9): 5 mg
  Filled 2014-09-10 (×9): qty 5

## 2014-09-10 MED ORDER — VITAL AF 1.2 CAL PO LIQD
1000.0000 mL | ORAL | Status: DC
  Administered 2014-09-10: 1000 mL
  Filled 2014-09-10 (×2): qty 1000

## 2014-09-10 MED ORDER — ACETAMINOPHEN 160 MG/5ML PO SOLN
325.0000 mg | ORAL | Status: DC | PRN
  Administered 2014-09-11 – 2014-09-15 (×16): 325 mg
  Filled 2014-09-10 (×22): qty 10.2

## 2014-09-10 MED ORDER — VITAL AF 1.2 CAL PO LIQD: 1000.0000 mL | ORAL | Status: DC

## 2014-09-10 MED ORDER — VITAL AF 1.2 CAL PO LIQD
1000.0000 mL | ORAL | Status: DC
  Administered 2014-09-10 – 2014-09-12 (×5): 1000 mL
  Filled 2014-09-10 (×12): qty 1000

## 2014-09-10 NOTE — Progress Notes (Signed)
NUTRITION FOLLOW UP  Intervention:    Increase Vital AF 1.2 by 10 ml every 4 hours to goal rate of 75 ml/h to provide 2160 kcals, 135 gm protein, 1459 ml free water daily.  Nutrition Dx:   Inadequate oral intake related to inability to eat as evidenced by NPO status, ongoing.  Goal:   Intake to meet >90% of estimated nutrition needs, progressing.  Monitor:   TF tolerance/adequacy, weight trend, labs, ability to advance PO diet.  Assessment:   Patient admitted on 12/30 for jejunostomy, pyloroplasty, and transhiatal total esophagectomy for gastroesophageal cancer. History of severe malnutrition (diagnosed 06/01/14). Followed by [facility] cancer [facility].  Patient has been tolerating trickle TF with Vital AF 1.2 at 30 ml/h via J-tube. Received MD Consult for TF advancement and management. Plans for gastrograffin study tomorrow, if no leak can start clear liquids PO.  Height: Ht Readings from Last 1 Encounters:  09/06/14 5\' 10"  (1.778 m)    Weight Status:   Wt Readings from Last 1 Encounters:  09/09/14 206 lb 3.2 oz (93.532 kg)    Re-estimated needs:  Kcal: 2250-2475 Protein: 120-140 gm Fluid: 2.3-2.5 L  Skin: no issues  Diet Order: Diet NPO time specified   Intake/Output Summary (Last 24 hours) at 09/10/14 1451 Last data filed at 09/10/14 0600  Gross per 24 hour  Intake   1850 ml  Output   1300 ml  Net    550 ml    Last BM: 1/3   Labs:   Recent Labs Lab 09/08/14 0430 09/09/14 0400 09/10/14 0425  NA 139 137 140  K 3.5 3.5 3.7  CL 106 103 106  CO2 26 28 30   BUN 6 6 7   CREATININE 0.65 0.69 0.71  CALCIUM 8.3* 8.6 8.8  GLUCOSE 110* 128* 125*    CBG (last 3)  No results for input(s): GLUCAP in the last 72 hours.  Scheduled Meds: . antiseptic oral rinse  7 mL Mouth Rinse q12n4p  . chlorhexidine  15 mL Mouth Rinse BID  . Chlorhexidine Gluconate Cloth  6 each Topical Q0600  . enoxaparin (LOVENOX) injection  40 mg Subcutaneous Q24H  . mupirocin cream   Topical  BID  . pantoprazole (PROTONIX) IV  40 mg Intravenous Q12H  . sodium chloride  10 mL Intravenous Q12H    Continuous Infusions: . dextrose 5 % and 0.9% NaCl 50 mL/hr at 09/10/14 1338  . feeding supplement (VITAL AF 1.2 CAL) 1,000 mL (09/10/14 0945)    Molli Barrows, RD, LDN, [place] Pager [phone] After Hours Pager [phone]

## 2014-09-10 NOTE — Progress Notes (Signed)
Vital tube feed increased to 75ml/hr.  Unable to document accurately in [facility].  Order is to increase by 73mls q4hr as tolerated with a goal of 44ml/hr.  Will con't plan of care and report thoroughly.  Next increase due at 8PM.

## 2014-09-10 NOTE — Progress Notes (Signed)
Pt has not used PCA all night per his request. He was uncomfortable with the O2 and CO2 sensors on, thus they were removed. He states that he was comfortable and slept better with fewer attachments. He had a small soft semi-formed BM this am.

## 2014-09-10 NOTE — Progress Notes (Signed)
Pt ambulating independently in hall.

## 2014-09-10 NOTE — Progress Notes (Addendum)
      [address]       [place] [address]             [phone]       5 Days Post-Op Procedure(s) (LRB): TRANSHIATAL TOTAL ESOPHAGECTOMY; Cervical esophagogastrostomy (N/A) VIDEO BRONCHOSCOPY (N/A) FEEDING JEJUNOSTOMY (N/A) PYLOROPLASTY (N/A)  Subjective: Patient with small bowel movement earlier this am. Has no abdominal pain this am, no nausea or emesis. Mouth is less dry with Biotene rinse. Slept well, first time since surgery, last night.  Objective: Vital signs in last 24 hours: Temp:  [98 F (36.7 C)-98.7 F (37.1 C)] 98 F (36.7 C) (01/04 0428) Pulse Rate:  [61-102] 81 (01/04 0428) Cardiac Rhythm:  [-] Normal sinus rhythm (01/03 2015) Resp:  [10-18] 18 (01/04 0428) BP: (127-152)/(76-87) 136/81 mmHg (01/04 0428) SpO2:  [96 %-100 %] 97 % (01/04 0428)     Intake/Output from previous day: 01/03 0701 - 01/04 0700 In: 2400 [I.V.:1150; NG/GT:1020; IV Piggyback:200] Out: 1750 [Urine:1750]   Physical Exam:  Cardiovascular: RRR Pulmonary: Clear to auscultation bilaterally; no rales, wheezes, or rhonchi. Abdomen: Soft, non tender, non distended,bowel sounds present. Extremities: Trace bilateral lower extremity edema. Wounds: Clean and dry.  No erythema or signs of infection.   Lab Results: CBC: Recent Labs  09/08/14 0430 09/10/14 0425  WBC 6.2 4.7  HGB 10.2* 10.5*  HCT 30.9* 31.8*  PLT 180 203   BMET:  Recent Labs  09/09/14 0400 09/10/14 0425  NA 137 140  K 3.5 3.7  CL 103 106  CO2 28 30  GLUCOSE 128* 125*  BUN 6 7  CREATININE 0.69 0.71  CALCIUM 8.6 8.8    PT/INR: No results for input(s): LABPROT, INR in the last 72 hours. ABG:  INR: Will add last result for INR, ABG once components are confirmed Will add last 4 CBG results once components are confirmed  Assessment/Plan:  1. CV - SR in the 70's. 2.  Pulmonary - CXR stable.Encourage incentive spirometer 3.GI-NGT removed yesterday. NPO and on TFs at 30 ml/hr. Likely  increase. Will need Gastrografin swallow study in 1-2 days. 4. Anemia-H and H stable at 10. 5 and 31.8 5. PCA stopped yesterday. Will give IV PRN pain med  ZIMMERMAN,DONIELLE MPA-C 09/10/2014,7:26 AM  Wounds healing well No drainage from the neck drain gastrografin study ordered for tomorrow, if no leak start clear liquids Path still pending Tube feedings increased I have seen and examined Josephine Cables and agree with the above assessment  and plan.  [name] MD Beeper [phone] Office [phone] 09/10/2014 9:41 AM

## 2014-09-10 NOTE — Progress Notes (Signed)
110 mcg fentanyl wasted in sharps from [place] PCA. Witnessed by [employer].

## 2014-09-10 NOTE — Progress Notes (Signed)
Pt stated that he could not tolerate feeding to be increased to 60 ml/hr at this time. Pt requested to wait until the morning to increase feeding. Pt is currently resting with feeding set to 50 ml/hr. Will continue to monitor.

## 2014-09-10 NOTE — Progress Notes (Signed)
Pt ambulated 1000 ft with very minimal assist.  No complaints during walk, HR stable.  Assisted to chair with call bell in reach.  Pt wishes to walk independently.  Rn feels this is safe at this point.  Will con't plan of care.

## 2014-09-11 ENCOUNTER — Inpatient Hospital Stay (HOSPITAL_COMMUNITY): Payer: BLUE CROSS/BLUE SHIELD

## 2014-09-11 MED ORDER — IOHEXOL 300 MG/ML  SOLN
150.0000 mL | Freq: Once | INTRAMUSCULAR | Status: AC | PRN
  Administered 2014-09-11: 30 mL via ORAL

## 2014-09-11 MED ORDER — PANTOPRAZOLE SODIUM 40 MG PO PACK
40.0000 mg | PACK | Freq: Two times a day (BID) | ORAL | Status: DC
  Administered 2014-09-11 – 2014-09-14 (×7): 40 mg via ORAL
  Filled 2014-09-11 (×11): qty 20

## 2014-09-11 NOTE — Progress Notes (Signed)
Pt ambulated 1100 ft around unit independently. Pt tolerated well. Pt currently resting in room. Will continue to monitor.

## 2014-09-11 NOTE — Progress Notes (Signed)
Pt ambulating in hall independently throughout day. Joel Miller 4:56 PM

## 2014-09-11 NOTE — Progress Notes (Signed)
Utilization review completed.  

## 2014-09-11 NOTE — Progress Notes (Signed)
Final path discussed with patient yesterday- confirmed extensive tumour with treatment effect but still present  Carcinoma of distal third of esophagus   Staging form: Esophagus - Adenocarcinoma, AJCC 7th Edition     Clinical: TX, N2 - Unsigned     Pathologic stage from 09/10/2014: Stage IIIB (yT3, N2, cM0, G2 - Moderately well differentiated) - Signed by [name], MD on 09/11/2014

## 2014-09-11 NOTE — Progress Notes (Addendum)
       [address]       [place] [address]             [phone]          6 Days Post-Op Procedure(s) (LRB): TRANSHIATAL TOTAL ESOPHAGECTOMY; Cervical esophagogastrostomy (N/A) VIDEO BRONCHOSCOPY (N/A) FEEDING JEJUNOSTOMY (N/A) PYLOROPLASTY (N/A)  Subjective: Feels well, no complaints except difficulty sleeping at night.    Objective: Vital signs in last 24 hours: Patient Vitals for the past 24 hrs:  BP Temp Temp src Pulse Resp SpO2  09/11/14 0455 134/86 mmHg 98.2 F (36.8 C) Oral 76 20 97 %  09/10/14 2100 130/80 mmHg 98.4 F (36.9 C) Oral 68 19 97 %  09/10/14 1500 128/75 mmHg 98.7 F (37.1 C) Oral 81 18 -   Current Weight  09/09/14 206 lb 3.2 oz (93.532 kg)     Intake/Output from previous day: 01/04 0701 - 01/05 0700 In: 10 [I.V.:10] Out: 775 [Urine:775]    PHYSICAL EXAM:  Heart: RRR Lungs: Clear Wound: Clean and dry Abdomen: Soft, NT/NT. +BS Neck: drain in place with no drainage    Lab Results: CBC: Recent Labs  09/10/14 0425  WBC 4.7  HGB 10.5*  HCT 31.8*  PLT 203   BMET:  Recent Labs  09/09/14 0400 09/10/14 0425  NA 137 140  K 3.5 3.7  CL 103 106  CO2 28 30  GLUCOSE 128* 125*  BUN 6 7  CREATININE 0.69 0.71  CALCIUM 8.6 8.8    PT/INR: No results for input(s): LABPROT, INR in the last 72 hours.  Esophagram: FINDINGS: Typical postop changes of. esophagectomy and esophagogastrostomy. Anastomosis is widely patent. No leak identified. No obstruction of contrast which extends into the stomach and duodenum. No leak was seen with water-soluble Omnipaque contrast nor with barium.  Soft tissue drain in the left neck. Right jugular central venous catheter tip in the SVC.  IMPRESSION: Typical postop changes of esophagectomy and esophagogastrostomy. Negative for leak or obstruction.    Assessment/Plan: S/P Procedure(s) (LRB): TRANSHIATAL TOTAL ESOPHAGECTOMY; Cervical esophagogastrostomy (N/A) VIDEO  BRONCHOSCOPY (N/A) FEEDING JEJUNOSTOMY (N/A) PYLOROPLASTY (N/A) Swallow study revealed no anastomotic leak.  Will start sips of clears. No drainage from neck drain.  Hopefully can start to slowly advance drain soon. Continue TFs for now.   LOS: 6 days    COLLINS,GINA H 09/11/2014  I have seen and examined the patient and agree with the assessment and plan as outlined.  Start advancing Penrose after he's tolerating unrestricted clear liquid diet w/out any sign of drainage or change in appearance of neck incision.  Will convert Protonix to oral suspension and d/c old central line.  OWEN,CLARENCE H 09/11/2014 4:36 PM

## 2014-09-11 NOTE — Progress Notes (Signed)
IJ removed. Pressure held. Pt on bedrest until 1830. Tolerated well. Sherrie Mustache 6:08 PM

## 2014-09-12 NOTE — Progress Notes (Signed)
[address]       [place] [address]             [phone]      7 Days Post-Op Procedure(s) (LRB): TRANSHIATAL TOTAL ESOPHAGECTOMY; Cervical esophagogastrostomy (N/A) VIDEO BRONCHOSCOPY (N/A) FEEDING JEJUNOSTOMY (N/A) PYLOROPLASTY (N/A) Subjective: conts to feel better, tolerating sips of clears   Objective: Vital signs in last 24 hours: Temp:  [97.8 F (36.6 C)-98.9 F (37.2 C)] 97.8 F (36.6 C) (01/06 0500) Pulse Rate:  [79-86] 79 (01/06 0500) Cardiac Rhythm:  [-] Normal sinus rhythm (01/06 0732) Resp:  [18-19] 18 (01/06 0500) BP: (128-148)/(75-86) 128/75 mmHg (01/06 0500) SpO2:  [98 %] 98 % (01/06 0500)  Hemodynamic parameters for last 24 hours:    Intake/Output from previous day: 01/05 0701 - 01/06 0700 In: 60  Out: 675 [Urine:675] Intake/Output this shift:    General appearance: alert, cooperative and no distress Heart: regular rate and rhythm Lungs: dim right> left base Abdomen: soft, non dist, + BS Extremities: warm no edema Wound: incis healing well  Lab Results:  Recent Labs  09/10/14 0425  WBC 4.7  HGB 10.5*  HCT 31.8*  PLT 203   BMET:  Recent Labs  09/10/14 0425  NA 140  K 3.7  CL 106  CO2 30  GLUCOSE 125*  BUN 7  CREATININE 0.71  CALCIUM 8.8    PT/INR: No results for input(s): LABPROT, INR in the last 72 hours. ABG    Component Value Date/Time   PHART 7.342* 09/06/2014 0413   HCO3 23.8 09/06/2014 0413   TCO2 25 09/06/2014 0413   ACIDBASEDEF 2.0 09/06/2014 0413   O2SAT 97.0 09/06/2014 0413   CBG (last 3)   Recent Labs  09/10/14 0627  GLUCAP 132*    Meds Scheduled Meds: . antiseptic oral rinse  7 mL Mouth Rinse q12n4p  . chlorhexidine  15 mL Mouth Rinse BID  . enoxaparin (LOVENOX) injection  40 mg Subcutaneous Q24H  . mupirocin cream   Topical BID  . pantoprazole sodium  40 mg Oral BID  . sodium chloride  10 mL Intravenous Q12H   Continuous Infusions: . dextrose 5 % and 0.9% NaCl 50  mL/hr at 09/11/14 1130  . feeding supplement (VITAL AF 1.2 CAL) 1,000 mL (09/12/14 0626)   PRN Meds:.oxyCODONE **AND** acetaminophen, fentaNYL, phenol, sodium chloride  Xrays Dg Esophagus W/water Sol Cm  09/11/2014   ADDENDUM REPORT: 09/11/2014 14:30  ADDENDUM: Note is made of pneumoperitoneum with gas under the right hemidiaphragm consistent with recent abdominal surgery. This is unchanged from yesterday.   Electronically Signed   By: Franchot Gallo M.D.   On: 09/11/2014 14:30   09/11/2014   CLINICAL DATA:  Esophageal cancer. Esophagectomy 09/05/2014. Rule out leak.  EXAM: ESOPHOGRAM/BARIUM SWALLOW  TECHNIQUE: Single contrast examination was performed using Omnipaque water-soluble contrast followed by thin barium.  FLUOROSCOPY TIME:  Two Min and 6 seconds  COMPARISON:  None.  FINDINGS: Typical postop changes of. esophagectomy and esophagogastrostomy. Anastomosis is widely patent. No leak identified. No obstruction of contrast which extends into the stomach and duodenum. No leak was seen with water-soluble Omnipaque contrast nor with barium.  Soft tissue drain in the left neck. Right jugular central venous catheter tip in the SVC.  IMPRESSION: Typical postop changes of esophagectomy and esophagogastrostomy. Negative for leak or obstruction.  Electronically Signed: By: Franchot Gallo M.D. On: 09/11/2014 10:17    Assessment/Plan: S/P Procedure(s) (LRB): TRANSHIATAL TOTAL ESOPHAGECTOMY; Cervical esophagogastrostomy (N/A)  VIDEO BRONCHOSCOPY (N/A) FEEDING JEJUNOSTOMY (N/A) PYLOROPLASTY (N/A)  1 tolerating sips- advance to clears/possible full soon 2 leave drain alone for now 3 no new labs    LOS: 7 days    Alexander Mcauley E 09/12/2014

## 2014-09-12 NOTE — Progress Notes (Signed)
Pt ambulating in halls independently throughout shift. Sherrie Mustache 3:29 PM

## 2014-09-13 MED ORDER — VITAL AF 1.2 CAL PO LIQD
1000.0000 mL | ORAL | Status: DC
  Administered 2014-09-13 – 2014-09-14 (×2): 1000 mL
  Filled 2014-09-13 (×4): qty 1000

## 2014-09-13 NOTE — Progress Notes (Signed)
Nursing note  Pt ambulated in hallway independently. Tolerated well. Will continue to monitor patient. [place] Nishi, Bettina Gavia RN

## 2014-09-13 NOTE — Progress Notes (Addendum)
[address]       [place] [address]             [phone]      8 Days Post-Op Procedure(s) (LRB): TRANSHIATAL TOTAL ESOPHAGECTOMY; Cervical esophagogastrostomy (N/A) VIDEO BRONCHOSCOPY (N/A) FEEDING JEJUNOSTOMY (N/A) PYLOROPLASTY (N/A) Subjective: Feels good, no problem with clear liquids, scant drainage from neck drain  Objective: Vital signs in last 24 hours: Temp:  [98.1 F (36.7 C)-98.8 F (37.1 C)] 98.5 F (36.9 C) (01/07 0605) Pulse Rate:  [76-81] 81 (01/07 0605) Cardiac Rhythm:  [-] Normal sinus rhythm (01/06 2010) Resp:  [18] 18 (01/07 0605) BP: (129-144)/(64-85) 129/64 mmHg (01/07 0605) SpO2:  [98 %-99 %] 98 % (01/07 0605)  Hemodynamic parameters for last 24 hours:    Intake/Output from previous day: 01/06 0701 - 01/07 0700 In: 1000 [P.O.:100; NG/GT:900] Out: 500 [Urine:500] Intake/Output this shift:    General appearance: alert, cooperative and no distress Heart: regular rate and rhythm Lungs: clear to auscultation bilaterally Abdomen: benign exam Extremities: extremities normal, atraumatic, no cyanosis or edema Wound: no edema  Lab Results: No results for input(s): WBC, HGB, HCT, PLT in the last 72 hours. BMET: No results for input(s): NA, K, CL, CO2, GLUCOSE, BUN, CREATININE, CALCIUM in the last 72 hours.  PT/INR: No results for input(s): LABPROT, INR in the last 72 hours. ABG    Component Value Date/Time   PHART 7.342* 09/06/2014 0413   HCO3 23.8 09/06/2014 0413   TCO2 25 09/06/2014 0413   ACIDBASEDEF 2.0 09/06/2014 0413   O2SAT 97.0 09/06/2014 0413   CBG (last 3)  No results for input(s): GLUCAP in the last 72 hours.  Meds Scheduled Meds: . antiseptic oral rinse  7 mL Mouth Rinse q12n4p  . chlorhexidine  15 mL Mouth Rinse BID  . enoxaparin (LOVENOX) injection  40 mg Subcutaneous Q24H  . mupirocin cream   Topical BID  . pantoprazole sodium  40 mg Oral BID  . sodium chloride  10 mL Intravenous Q12H    Continuous Infusions: . dextrose 5 % and 0.9% NaCl Stopped (09/11/14 1800)  . feeding supplement (VITAL AF 1.2 CAL) 1,000 mL (09/12/14 2010)   PRN Meds:.oxyCODONE **AND** acetaminophen, fentaNYL, phenol, sodium chloride  Xrays Dg Esophagus W/water Sol Cm  09/11/2014   ADDENDUM REPORT: 09/11/2014 14:30  ADDENDUM: Note is made of pneumoperitoneum with gas under the right hemidiaphragm consistent with recent abdominal surgery. This is unchanged from yesterday.   Electronically Signed   By: Franchot Gallo M.D.   On: 09/11/2014 14:30   09/11/2014   CLINICAL DATA:  Esophageal cancer. Esophagectomy 09/05/2014. Rule out leak.  EXAM: ESOPHOGRAM/BARIUM SWALLOW  TECHNIQUE: Single contrast examination was performed using Omnipaque water-soluble contrast followed by thin barium.  FLUOROSCOPY TIME:  Two Min and 6 seconds  COMPARISON:  None.  FINDINGS: Typical postop changes of. esophagectomy and esophagogastrostomy. Anastomosis is widely patent. No leak identified. No obstruction of contrast which extends into the stomach and duodenum. No leak was seen with water-soluble Omnipaque contrast nor with barium.  Soft tissue drain in the left neck. Right jugular central venous catheter tip in the SVC.  IMPRESSION: Typical postop changes of esophagectomy and esophagogastrostomy. Negative for leak or obstruction.  Electronically Signed: By: Franchot Gallo M.D. On: 09/11/2014 10:17    Assessment/Plan: S/P Procedure(s) (LRB): TRANSHIATAL TOTAL ESOPHAGECTOMY; Cervical esophagogastrostomy (N/A) VIDEO BRONCHOSCOPY (N/A) FEEDING JEJUNOSTOMY (N/A) PYLOROPLASTY (N/A)  1 advance to full liquid 2 begin to advance drain 3  decrease TF's a little, feels full a lot   LOS: 8 days    GOLD,WAYNE E 09/13/2014 Gastrografin swallow yesterday reviewed and it is normal Patient is tolerating clear liquids well and will start full liquids Leave  Penrose drain today Agree with plans to reduce tube feeding-avoid risk for  emesis Labs ordered for a.m.

## 2014-09-13 NOTE — Progress Notes (Signed)
Nursing note Patient ambulated in hallway independently, will continue to monitor patient. Ulus Hazen, Bettina Gavia rN

## 2014-09-14 LAB — BASIC METABOLIC PANEL
Anion gap: 5 (ref 5–15)
BUN: 10 mg/dL (ref 6–23)
CO2: 30 mmol/L (ref 19–32)
Calcium: 8.8 mg/dL (ref 8.4–10.5)
Chloride: 102 mEq/L (ref 96–112)
Creatinine, Ser: 0.61 mg/dL (ref 0.50–1.35)
GFR calc Af Amer: >90 mL/min (ref >90)
GFR calc non Af Amer: >90 mL/min (ref >90)
Glucose, Bld: 133 mg/dL — ABNORMAL HIGH (ref 70–99)
Potassium: 4.3 mmol/L (ref 3.5–5.1)
Sodium: 137 mmol/L (ref 135–145)

## 2014-09-14 LAB — CBC
HCT: 32.7 % — ABNORMAL LOW (ref 39.0–52.0)
Hemoglobin: 10.6 g/dL — ABNORMAL LOW (ref 13.0–17.0)
MCH: 30.7 pg (ref 26.0–34.0)
MCHC: 32.4 g/dL (ref 30.0–36.0)
MCV: 94.8 fL (ref 78.0–100.0)
Platelets: 211 10*3/uL (ref 150–400)
RBC: 3.45 MIL/uL — ABNORMAL LOW (ref 4.22–5.81)
RDW: 15 % (ref 11.5–15.5)
WBC: 6 10*3/uL (ref 4.0–10.5)

## 2014-09-14 NOTE — Progress Notes (Signed)
Nursing note Patient tube feedings dcd as ordered dressing placed on J tube.as ordered. Will monitor patient. Starnisha Batrez, Bettina Gavia RN

## 2014-09-14 NOTE — Discharge Summary (Signed)
Physician Discharge Summary  Patient ID: Joel Miller MRN: [id number] DOB/AGE: 60/10/1955 60 y.o.  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 Esophageal cancer 09/05/2014  . Carcinoma of distal third of esophagus 05/28/2014  . Dysphagia, unspecified(787.20) 05/17/2014  . Personal history of colonic polyps 05/17/2014  . GERD 04/22/2014  . Encounter for long-term (current) use of other medications 01/15/2014  . Malignant neoplasm of prostate 10/09/2013  . Severe obesity (BMI >= 40) 10/09/2013  . Hypertension   . Hyperlipidemia   . T2 NIDDM   . Testosterone Deficiency   . Vitamin D deficiency    History of Present Illness:  Joel Miller 59 y.o. male is seen in the office for esophageal cancer. Patient first noted weight loss of up to 40-50 pounds over 3 months during summer. In June of 2015 he noted increasing difficulty swallowing. In September upper GI endoscopy was performed demonstrating esophageal carcinoma. The patient had no previous history of reflux, is a nonsmoker. Per report: "Starting at the gastric cardia and extending into the lower one third of the esophagus there was a circumferential, exophytic, friable tumor causing a long stricture. The 9 mm gastroscope easily traversed this area. Tumor was seen in clusters throughout the esophagus up to approximately 25 cm from the incisors." No endoscopic ultrasound has been performed  Patient has now completed 5 weekly cycles of weekly Taxol/carboplatin And on November 11 completed course of radiotherapy. He notes he is feeling better, his eating is almost normal. His weight has stabilized. he was admitted this hospitalization for surgical resection.  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  SURGICAL PROCEDURE: Video bronchoscopy, transhiatal total esophagectomy with cervical esophagogastrostomy, pyloroplasty, and feeding jejunostomy.  SURGEON: [name], MD  FIRST ASSISTANT: Lars Pinks, PA. The patient was transferred to the Surgical Intensive Care Unit for further postoperative care, having tolerated the procedure without obvious complication.  Post operative Hospital course: The patient has overall progressed well. He was started early on [place] tube feedings and advanced over time to full diet in routine succession. A swallow study revealed no evidence of leak or obstruction. All routine lines, monitors and tubes/drains have been discontinued in the standard fashion. He is advancing well in regards to his physical recovery and tolerating ambulation without difficulty. Incisions are noted to be healing well without evidence of infection. He has a mild expected acute blood loss anemia. This is stable. Oxygen has been weaned and he maintains good saturations on room air. His overall condition is felt to be stable for discharge on September 15, 2014.  Consults: None  Significant Diagnostic Studies: radiology: Swallow study 09/11/2014  Treatments: Jejunostomy tube feedings  Disposition: Discharged to home  Medications at time of discharge:    Medication List    STOP taking these medications        aspirin 81 MG tablet     losartan 100 MG tablet  Commonly known as:  COZAAR  naproxen sodium 220 MG tablet  Commonly known as:  ANAPROX     prochlorperazine 10  MG tablet  Commonly known as:  COMPAZINE     VITAMIN D PO     zolpidem 12.5 MG CR tablet  Commonly known as:  AMBIEN CR      TAKE these medications        acetaminophen 160 MG/5ML solution  Commonly known as:  TYLENOL  Take 10.2 mLs (325 mg total) by mouth every 4 (four) hours as needed for moderate pain.     oxyCODONE 5 MG/5ML solution  Commonly known as:  ROXICODONE  Take 5 mLs (5 mg total) by mouth every 4 (four) hours as needed for moderate pain or severe pain.     pantoprazole sodium 40 mg/20 mL Pack  Commonly known as:  PROTONIX  Take 20 mLs (40 mg total) by mouth 2 (two) times daily.     testosterone 50 MG/5GM (1%) Gel  Commonly known as:  ANDROGEL  Place 5 g onto the skin daily. Apply 2 pumps daily  Dispense 1pump per month             Follow-up Information    Follow up with [name], MD.   Specialty:  Cardiothoracic Surgery   Why:   Office will contact you his appointment in 2 weeks to see surgeon. Please obtain chest x-ray [facility] imaging 1 hour prior to this appointment. [facility] imaging is located in the same office complex.    Contact information:   [address] [place] [address] [phone]       Follow up with [facility].   Why:  The office will contact you to see the nurse next week for staple removal from incision.      SignedJadene Pierini E 09/14/2014, 9:27 AM

## 2014-09-14 NOTE — Progress Notes (Addendum)
      [address]       [place] [address]             [phone]      9 Days Post-Op Procedure(s) (LRB): TRANSHIATAL TOTAL ESOPHAGECTOMY; Cervical esophagogastrostomy (N/A) VIDEO BRONCHOSCOPY (N/A) FEEDING JEJUNOSTOMY (N/A) PYLOROPLASTY (N/A) Subjective: conts to feel well  Objective: Vital signs in last 24 hours: Temp:  [98.2 F (36.8 C)-98.5 F (36.9 C)] 98.4 F (36.9 C) (01/08 0530) Pulse Rate:  [69-84] 69 (01/08 0530) Cardiac Rhythm:  [-] Normal sinus rhythm (01/07 2000) Resp:  [18-19] 19 (01/08 0530) BP: (124-135)/(70-83) 135/79 mmHg (01/08 0530) SpO2:  [96 %-100 %] 99 % (01/08 0530)  Hemodynamic parameters for last 24 hours:    Intake/Output from previous day: 01/07 0701 - 01/08 0700 In: 1016 [P.O.:360; NG/GT:646] Out: -  Intake/Output this shift:    General appearance: alert, cooperative and no distress Heart: regular rate and rhythm Lungs: dim in bases Abdomen: soft, nontender, + BS Extremities: extremities normal, atraumatic, no cyanosis or edema Wound: healing well  Lab Results:  Recent Labs  09/14/14 0435  WBC 6.0  HGB 10.6*  HCT 32.7*  PLT 211   BMET:  Recent Labs  09/14/14 0435  NA 137  K 4.3  CL 102  CO2 30  GLUCOSE 133*  BUN 10  CREATININE 0.61  CALCIUM 8.8    PT/INR: No results for input(s): LABPROT, INR in the last 72 hours. ABG    Component Value Date/Time   PHART 7.342* 09/06/2014 0413   HCO3 23.8 09/06/2014 0413   TCO2 25 09/06/2014 0413   ACIDBASEDEF 2.0 09/06/2014 0413   O2SAT 97.0 09/06/2014 0413   CBG (last 3)  No results for input(s): GLUCAP in the last 72 hours.  Meds Scheduled Meds: . antiseptic oral rinse  7 mL Mouth Rinse q12n4p  . chlorhexidine  15 mL Mouth Rinse BID  . enoxaparin (LOVENOX) injection  40 mg Subcutaneous Q24H  . mupirocin cream   Topical BID  . pantoprazole sodium  40 mg Oral BID  . sodium chloride  10 mL Intravenous Q12H   Continuous Infusions: . dextrose 5 %  and 0.9% NaCl Stopped (09/11/14 1800)  . feeding supplement (VITAL AF 1.2 CAL) 1,000 mL (09/14/14 0100)   PRN Meds:.oxyCODONE **AND** acetaminophen, fentaNYL, phenol, sodium chloride  Xrays No results found.  Assessment/Plan: S/P Procedure(s) (LRB): TRANSHIATAL TOTAL ESOPHAGECTOMY; Cervical esophagogastrostomy (N/A) VIDEO BRONCHOSCOPY (N/A) FEEDING JEJUNOSTOMY (N/A) PYLOROPLASTY (N/A)  1 conts to do well, soft diet today, drain was advanced and came out, will not need TF's at home, arrange home nurse for tube flushes and check incision. Patient has had bowel movements. 2 probable d/c in am 3 LABS OK, check CXR in am  LOS: 9 days    GOLD,WAYNE E 09/14/2014  Patient examined, doing very well and tolerating soft diet Neck drain out, incisions healing well Leave skin staples intact and have patient come to office for wound check , staple removal in about a week No need to cont tube feeds- will DC today and arrange HHN to flush and check 3x week  patient examined and medical record reviewed,agree with above note. VAN TRIGT III,Montrell Cessna 09/14/2014   Discharge instructions re: activity and wound care reviewed with patient

## 2014-09-14 NOTE — Discharge Instructions (Signed)
Esophagogastrectomy What to do after you leave the hospital:  Recommended diet: soft foods or pureed foods  Recommended activity: no lifting, driving, or strenuous exercise for 3 weeks  Follow-up with surgeon: office will contact you   Other instructions:  Take temperature twice daily and notify office if greater than 101.5 May shower: keep incisions clean and dry Call for incision drainage or if looks red   Esophagogastrectomy is a surgery to remove a diseased portion of your esophagus. The upper portion of your stomach and some lymph nodes in the area are removed at the same time. In most cases, your stomach is then attached directly to the remaining portion of the esophagus. Esophagogastrectomy is often done as a treatment for cancer or precancerous conditions. The surgery may also be done to treat severe injuries to the esophagus. LET YOUR CAREGIVER KNOW ABOUT:   Allergies to food or medicine.  Medicines taken, including vitamins, herbs, eyedrops, over-the-counter medicines, and creams.  Use of steroids (by mouth or creams).  Previous problems with anesthetics or numbing medicines.  History of bleeding problems or blood clots.  Previous surgery.  Other health problems, including diabetes and kidney problems.  Possibility of pregnancy, if this applies. RISKS AND COMPLICATIONS   Allergies to medicines used during the procedure.  Problems with breathing.  Bleeding.  Infection.  Damage to other structures near the esophagus and stomach.  Problems with swallowing. BEFORE THE PROCEDURE   Stop smoking if you smoke. Stopping will improve your health after surgery.  You may have blood tests to make sure your blood clots normally. Ask your caregiver about changing or stopping your regular medicines. You may be asked to stop taking blood thinners (anticoagulants) or nonsteroidal anti-inflammatory drugs (NSAIDs).  Do not eat or drink anything at least 8 hours before the  surgery.  You and your caregiver will talk about the different surgical approaches. Together, you will agree on the surgical approach that is right for you. PROCEDURE   An intravenous (IV) access tube is put into one of your veins to give you fluids and medicines.  You will receive medicines to relax you (sedatives) and medicines that make you sleep (general anesthetic).  You may have a flexible tube (catheter) put into your bladder to drain urine.  You may have a tube put through your nose or mouth into your stomach (NG tube) to remove digestive juices and prevent you from vomiting and feeling nauseous.  Surgical cuts (incisions) may be made in the throat, chest, or abdomen. Multiple smaller incisions may be made if the surgery can be done using a scope.  The part of the stomach to be removed will be stapled off and taken out.  The diseased length of esophagus will be removed, as well as lymph nodes in the area.  The remaining portion of the stomach will be attached to the remaining portion of the esophagus.  All incisions will be closed with stitches (sutures), staples, or surgical glue. AFTER THE PROCEDURE   You will wake up in a recovery room, resting in bed until you have fully returned to consciousness.  You will have some pain. Pain medicines will be available to help you.  Your temperature, breathing rate, heart rate, blood pressure, and oxygen level (vital signs) will be monitored regularly.  You may be admitted to an intensive care unit (ICU) in the hospital for 1-2 days. There, you can be closely monitored.  The head of your bed will be kept at an upright  angle.  You will likely have an NG tube that goes through your nose and into your stomach.  You may have a feeding tube. This tube will give you the proper nutrition until you can take food by mouth again.  You will likely have multiple tubes that are draining fluid from the incision.  You will have a catheter in  your bladder.  You will continue to receive IV fluids.  You may have compression stockings on your legs to prevent blood clots.  You will be taught some breathing exercises. These will help your lungs recover from the anesthesia.  When you are more stable, you will be transferred to a regular hospital room.  You will probably be in the hospital for about 10-14 days. During this time, various drains, tubes, and monitors will slowly be discontinued when they are no longer needed. You will also be slowly eased into doing more activity and drinking and eating by mouth again. Document Released: 02/23/2012 Document Reviewed: 02/23/2012 [facility] Patient Information 2015 [place]. This information is not intended to replace advice given to you by your health care provider. Make sure you discuss any questions you have with your health care provider.

## 2014-09-14 NOTE — Progress Notes (Signed)
Patient has ambulated in hallway independently several times through out the day. Will continue to monitor patient. Joel Miller, Bettina Gavia RN

## 2014-09-15 ENCOUNTER — Inpatient Hospital Stay (HOSPITAL_COMMUNITY): Payer: BLUE CROSS/BLUE SHIELD

## 2014-09-15 MED ORDER — OXYCODONE HCL 5 MG/5ML PO SOLN: 5.0000 mg | ORAL | Status: DC | PRN

## 2014-09-15 MED ORDER — ACETAMINOPHEN 160 MG/5ML PO SOLN: 325.0000 mg | ORAL | Status: DC | PRN

## 2014-09-15 MED ORDER — PANTOPRAZOLE SODIUM 40 MG PO PACK: 40.0000 mg | PACK | Freq: Two times a day (BID) | ORAL | Status: DC

## 2014-09-15 NOTE — Progress Notes (Signed)
Pt ambulated in hallway 600 ft independently on room air and tolerated activity well. Will continue to monitor.

## 2014-09-15 NOTE — Progress Notes (Signed)
Pt discharged per MD order and protocol. Discharge instructions reviewed with patient and all questions answered. Pt given all prescriptions and aware of follow up appointments.  

## 2014-09-15 NOTE — Progress Notes (Signed)
      [address]       [place] [address]             [phone]      10 Days Post-Op Procedure(s) (LRB): TRANSHIATAL TOTAL ESOPHAGECTOMY; Cervical esophagogastrostomy (N/A) VIDEO BRONCHOSCOPY (N/A) FEEDING JEJUNOSTOMY (N/A) PYLOROPLASTY (N/A)   Subjective:  Joel Miller has no complaints.  He feels great and is ready to go home.  He does have some discharge questions that were addressed.  Objective: Vital signs in last 24 hours: Temp:  [97.3 F (36.3 C)-97.9 F (36.6 C)] 97.9 F (36.6 C) (01/09 0502) Pulse Rate:  [76-88] 76 (01/09 0502) Cardiac Rhythm:  [-] Normal sinus rhythm (01/08 1920) Resp:  [18-20] 18 (01/09 0502) BP: (125-146)/(80-89) 145/89 mmHg (01/09 0502) SpO2:  [99 %] 99 % (01/09 0502)  Intake/Output from previous day: 01/08 0701 - 01/09 0700 In: 1390 [P.O.:240; NG/GT:1140] Out: -   General appearance: alert, cooperative and no distress Heart: regular rate and rhythm Lungs: clear to auscultation bilaterally Abdomen: soft, non-tender; bowel sounds normal; no masses,  no organomegaly Wound: clean and dry, staples remain in place  Lab Results:  Recent Labs  09/14/14 0435  WBC 6.0  HGB 10.6*  HCT 32.7*  PLT 211   BMET:  Recent Labs  09/14/14 0435  NA 137  K 4.3  CL 102  CO2 30  GLUCOSE 133*  BUN 10  CREATININE 0.61  CALCIUM 8.8    PT/INR: No results for input(s): LABPROT, INR in the last 72 hours. ABG    Component Value Date/Time   PHART 7.342* 09/06/2014 0413   HCO3 23.8 09/06/2014 0413   TCO2 25 09/06/2014 0413   ACIDBASEDEF 2.0 09/06/2014 0413   O2SAT 97.0 09/06/2014 0413   CBG (last 3)  No results for input(s): GLUCAP in the last 72 hours.  Assessment/Plan: S/P Procedure(s) (LRB): TRANSHIATAL TOTAL ESOPHAGECTOMY; Cervical esophagogastrostomy (N/A) VIDEO BRONCHOSCOPY (N/A) FEEDING JEJUNOSTOMY (N/A) PYLOROPLASTY (N/A)  1. Overall patient doing well 2. GI- will not require tube feeds at discharge,  continue soft diet 3. Dispo- patient stable, H/H arranged for J tube care, will d/c patient home today   LOS: 10 days    Ambyr Qadri 09/15/2014

## 2014-09-19 ENCOUNTER — Ambulatory Visit: Payer: Self-pay

## 2014-09-19 DIAGNOSIS — C155 Malignant neoplasm of lower third of esophagus: Secondary | ICD-10-CM

## 2014-09-19 DIAGNOSIS — Z4802 Encounter for removal of sutures: Secondary | ICD-10-CM

## 2014-09-19 NOTE — Progress Notes (Signed)
Removed 1 suture from chest tube site and 26 staples from upper midline laparotomy incision. No signs of infection and patient tolerated well.

## 2014-09-24 ENCOUNTER — Telehealth: Payer: Self-pay | Admitting: Oncology

## 2014-09-24 ENCOUNTER — Ambulatory Visit (HOSPITAL_BASED_OUTPATIENT_CLINIC_OR_DEPARTMENT_OTHER): Payer: BLUE CROSS/BLUE SHIELD | Admitting: Oncology

## 2014-09-24 VITALS — BP 119/69 | HR 94 | Temp 97.0°F | Resp 18 | Ht 70.0 in | Wt 201.3 lb

## 2014-09-24 DIAGNOSIS — E119 Type 2 diabetes mellitus without complications: Secondary | ICD-10-CM

## 2014-09-24 DIAGNOSIS — I10 Essential (primary) hypertension: Secondary | ICD-10-CM

## 2014-09-24 DIAGNOSIS — C160 Malignant neoplasm of cardia: Secondary | ICD-10-CM

## 2014-09-24 DIAGNOSIS — C155 Malignant neoplasm of lower third of esophagus: Secondary | ICD-10-CM

## 2014-09-24 NOTE — Progress Notes (Signed)
  [facility] OFFICE PROGRESS NOTE   Diagnosis: Esophagus cancer  INTERVAL HISTORY:   He underwent an esophagectomy and feeding jejunostomy/pyloroplasty on 09/05/2014. He is recovering from surgery. He reports tolerating a diet without difficulty. A jejunostomy feeding tube remains in place, but he is not using it. He develops discomfort at the right lower back in the evening. This pain was present prior to surgery. The pain is not severe.  The pathology ([id number]) confirmed an invasive moderately depression adenocarcinoma of the esophagus. Tumor extended into adventitial tissue and perineural invasion wasn't in a 5. Adenocarcinoma was present at the distal resection margin. Metastatic carcinoma was then applied in 2 of 6 lymph nodes with 3 soft tissue deposits. The tumor was located in the distal esophagus with involvement of the esophagogastric junction. Intraoperative frozen section margins were negative. A moderate to significant treatment effect was present. The tumor returned with HER-2 amplification.  Objective:  Vital signs in last 24 hours:  Blood pressure 119/69, pulse 94, temperature 97 F (36.1 C), temperature source Oral, resp. rate 18, height $RemoveBe'5\' 10"'scAhbdcNL$  (1.778 m), weight 201 lb 4.8 oz (91.309 kg), SpO2 100 %.    HEENT: Neck without mass Lymphatics: No cervical or supra-clavicular nodes Resp: Decreased breath sounds at the right base, no respiratory distress Cardio: Regular rate and rhythm GI: No hepatomegaly, left upper quadrant jejunostomy tube site without evidence of infection Vascular: No leg edema   Medications: I have reviewed the patient's current medications.  Assessment/Plan: 1. Adenocarcinoma the esophagus/GE junction-status post an endoscopy 05/18/2014 confirming a gastric cardia/lower esophageal mass with tumor extending proximally in the esophagus to 25 cm from the incisors  HER-2/neu amplified   Staging CT scan 05/22/2014 with  indeterminate bilateral pulmonary nodules; and prominent paraesophageal, porta hepatis, and para-aortic lymph nodes   Staging PET scan 05/29/2014 confirmed hypermetabolic soft tissue thickening at the distal third of the esophagus extending into the proximal stomach. 2 additional smaller areas of hypermetabolism or noted in the more proximal esophagus with a mildly enlarged hypermetabolic gastrohepatic node.   Initiation of radiation and concurrent Taxol/carboplatin 06/11/2014; Taxol/carboplatin completed 07/09/2014; radiation completed 07/18/2014.  Esophagectomy/jejunostomy feeding tube placement 09/05/2014 with the pathology revealing [id number] tumor, HER-2/neu amplified, positive distal resection margin  2. History of anorexia/weight loss 3. History of solid dysphagia -improved 4. Diabetes  5. Hypertension  6. Hyperlipidemia  7. back pain relieved with naproxen    Disposition:  He is recovering from the esophagectomy procedure. I reviewed the details of the surgical pathology report with Mr. Sudduth and his wife. He is at high risk of developing recurrent esophagus cancer. I explained the lack of data to support a benefit for "adjuvant" chemotherapy in this setting. However many oncologist consider adjuvant chemotherapy in patients at high risk of developing recurrent disease. I think we should consider FOLFOX or Capox chemotherapy. We discussed the role for HER-2 directed therapy in the metastatic setting. I explained there is no clear role for Herceptin in the adjuvant setting.  He is interested in obtaining another medical oncology opinion. I will refer him to Dr. Josefa Half to get her opinion regarding the indication for adjuvant therapy and to see whether he may be a support adjuvant clinical trial.  The low back pain is likely benign musculoskeletal. We will obtain spine imaging if this persists.    Betsy Coder, MD  09/24/2014  12:00 PM

## 2014-09-24 NOTE — Telephone Encounter (Signed)
Pt confirmed MD visit per 01/18 POF, gave pt AVS..... KJ  and sent msg to MD for referral to [facility] w/Dr. Fanny Skates

## 2014-09-26 ENCOUNTER — Other Ambulatory Visit: Payer: Self-pay | Admitting: Cardiothoracic Surgery

## 2014-09-26 DIAGNOSIS — C159 Malignant neoplasm of esophagus, unspecified: Secondary | ICD-10-CM

## 2014-09-27 ENCOUNTER — Ambulatory Visit (INDEPENDENT_AMBULATORY_CARE_PROVIDER_SITE_OTHER): Payer: Self-pay | Admitting: Cardiothoracic Surgery

## 2014-09-27 ENCOUNTER — Ambulatory Visit
Admission: RE | Admit: 2014-09-27 | Discharge: 2014-09-27 | Disposition: A | Discharge status: Home / Self Care | Payer: BLUE CROSS/BLUE SHIELD | Source: Ambulatory Visit | Attending: Cardiothoracic Surgery | Admitting: Cardiothoracic Surgery

## 2014-09-27 ENCOUNTER — Encounter: Payer: Self-pay | Admitting: Cardiothoracic Surgery

## 2014-09-27 VITALS — BP 103/69 | HR 81 | Resp 16 | Ht 70.0 in | Wt 206.0 lb

## 2014-09-27 DIAGNOSIS — C155 Malignant neoplasm of lower third of esophagus: Secondary | ICD-10-CM

## 2014-09-27 DIAGNOSIS — C159 Malignant neoplasm of esophagus, unspecified: Secondary | ICD-10-CM

## 2014-09-27 NOTE — Progress Notes (Signed)
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 SURGICAL PROCEDURE: Video bronchoscopy, transhiatal total esophagectomy with cervical esophagogastrostomy, pyloroplasty, and feeding jejunostomy. 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  1. Esophagogastrectomy, esophagus and stomach - INVASIVE ADENOCARCINOMA, MODERATELY DIFFERENTIATED, SPANNING AT LEAST 4.0 CM. - ADENOCARCINOMA EXTENDS INTO ADVENTITIAL TISSUE. - PERINEURAL INVASION IS IDENTIFIED. - ADENOCARCINOMA IS PRESENT AT THE DISTAL RESECTION MARGIN. - METASTATIC ADENOCARCINOMA IN 2 OF 6 LYMPH NODES (2/6). - 3 SOFT TISSUE DEPOSITS. - SEE ONCOLOGY TABLE BELOW. 2. Lymph node, biopsy, peri esophageal - METASTATIC ADENOCARCINOMA IN 1 OF 1 LYMPH NODE (1/1  Intraoperative frozen section margins were negative. A moderate to significant treatment effect was present. The tumor returned with HER-2 amplification.   History of Present Illness:     Patient is making good progress postoperatively. Taking his diet well with frequent small meals, he's had no reflux symptoms, no dumping syndrome. He is maintaining his weight approximately 206 pounds. He is not using his feeding  tube.    Past Medical History  Diagnosis Date  . Hyperlipidemia   . Obesity     lost 80 lbs  . Hypogonadism male   . Vitamin D deficiency   . 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 Allergy     HEY FEVER  . GERD (gastroesophageal reflux disease)   . S/P radiation therapy 06/11/2014-07/18/2014  . Hypertension     no med in 51month due to weight loss  . Diabetes mellitus without complication     type II no med 60 days      History  Smoking status  . Current Some Day Smoker -- 0.25 packs/day for 3 years  . Types: Cigars  Smokeless tobacco  . Never Used    Comment: has a cigar occasionally    History  Alcohol Use  . 1.0 oz/week  . 2 Not specified per week    Comment: social     Allergies  Allergen Reactions  . Ace Inhibitors     cough    Current Outpatient Prescriptions  Medication Sig Dispense Refill  . acetaminophen (TYLENOL) 160 MG/5ML solution Take 10.2 mLs (325 mg total) by mouth every 4 (four) hours as needed for moderate pain. 120 mL 0  . oxyCODONE (ROXICODONE) 5 MG/5ML solution Take 5 mLs (5 mg total) by mouth every 4 (four) hours as needed for moderate pain or severe pain. 473 mL 0  . pantoprazole sodium (PROTONIX) 40 mg/20 mL PACK Take 20 mLs (40 mg total) by mouth 2 (two) times daily. 60 each 1  . testosterone (ANDROGEL) 50 MG/5GM GEL Place 5 g onto the skin daily. Apply 2 pumps daily  Dispense 1pump per month 1 Package 5   No current facility-administered medications for this visit.   Wt Readings from Last 3 Encounters:  09/27/14 206 lb (93.441 kg)  09/24/14 201 lb 4.8 oz (91.309 kg)  09/09/14 206 lb 3.2 oz (93.532 kg)   Physical Exam: BP 103/69 mmHg  Pulse 81  Resp 16  Ht 5' 10"  (1.778 m)  Wt 206 lb (93.441 kg)  BMI 29.56 kg/m2  SpO2 98%  General appearance: alert, cooperative and appears stated age Neurologic: intact Heart: regular rate and rhythm, S1, S2 normal, no murmur, click, rub or gallop Lungs: clear to auscultation  bilaterally Abdomen: soft, non-tender; bowel sounds normal; no masses,  no organomegaly and Abdominal wound is healing well Extremities: extremities normal, atraumatic, no cyanosis or edema and Homans sign is negative, no sign of DVT Wound: Left neck incision is healing well Patient's feeding jejunostomy tube, is removed today without difficulty in its entirety   Diagnostic Studies & Laboratory data:     Recent Radiology Findings:   Dg Chest 2 View  09/27/2014   CLINICAL DATA:  Esophageal cancer, post esophagectomy 09/05/2014, post radiation therapy, history hypertension, diabetes, smoker  EXAM: CHEST  2 VIEW  COMPARISON:  09/15/2014  FINDINGS: Normal heart size, mediastinal contours, and pulmonary vascularity.  Mild bronchitic changes with minimal atelectasis or scarring LEFT mid lung unchanged.  Lungs otherwise clear.  No infiltrate, pleural effusion or pneumothorax.  Bones unremarkable.  IMPRESSION: Mild bronchitic changes with LEFT mid lung atelectasis or scarring.  No acute infiltrate or additional acute process.   Electronically Signed   By: Lavonia Dana M.D.   On: 09/27/2014 09:04      Recent Lab Findings: Lab Results  Component Value Date   WBC 6.0 09/14/2014   HGB 10.6* 09/14/2014   HCT 32.7* 09/14/2014   PLT 211 09/14/2014   GLUCOSE 133* 09/14/2014   ALT 17 09/03/2014   AST 23 09/03/2014   NA 137 09/14/2014   K 4.3 09/14/2014   CL 102 09/14/2014   CREATININE 0.61 09/14/2014   BUN 10 09/14/2014   CO2 30 09/14/2014   INR 1.12 09/03/2014      Assessment / Plan:      1. Adenocarcinoma the esophagus/GE junction-status post an endoscopy 05/18/2014 confirming a gastric cardia/lower esophageal mass with tumor extending proximally in the esophagus to 25 cm from the incisors    HER-2/neu amplified   Staging CT scan 05/22/2014 with indeterminate bilateral pulmonary nodules; and prominent paraesophageal, porta hepatis, and para-aortic lymph nodes   Staging PET scan  05/29/2014 confirmed hypermetabolic soft tissue thickening at the distal third of the esophagus extending into the proximal stomach. 2 additional smaller areas of hypermetabolism or noted in the more proximal esophagus with a mildly enlarged hypermetabolic gastrohepatic node.   Initiation of radiation and concurrent Taxol/carboplatin 06/11/2014; Taxol/carboplatin completed 07/09/2014; radiation completed 07/18/2014.  Esophagectomy/jejunostomy feeding tube placement 09/05/2014 with the pathology revealing aypT3ypN2 tumor, HER-2/neu amplified, positive distal resection margin, intraoperative negative by frozen section 2. History of anorexia/weight loss 3. History of solid dysphagia -improved 4. Diabetes  5. Hypertension  6. Hyperlipidemia   Patient to return in 4 weeks, considering continued chemotherapy in discussion with Dr. Benay Spice and has been referred for second opinion at [facility] from medical oncology.  [name] MD      [address] [place] [address] Office [phone]   Beeper [phone]  09/27/2014 9:29 AM

## 2014-10-02 ENCOUNTER — Telehealth: Payer: Self-pay | Admitting: Oncology

## 2014-10-02 NOTE — Telephone Encounter (Signed)
LEFT PT VM IN REF TO APPT. WITH DR Fanny Skates ON 10/19/14@11 :00. MEDICAL RECORDS FAXED. SLIDES AND SCANS WILL BE FEDEX'ED

## 2014-10-04 ENCOUNTER — Telehealth: Payer: Self-pay | Admitting: Oncology

## 2014-10-04 NOTE — Telephone Encounter (Signed)
Pt called to r/s MD visit due to his apt with Dr. Fanny Skates at [facility], pt confirmed updated sch.... Cherylann Banas

## 2014-10-10 ENCOUNTER — Ambulatory Visit: Payer: BLUE CROSS/BLUE SHIELD | Admitting: Oncology

## 2014-10-11 ENCOUNTER — Encounter: Payer: BLUE CROSS/BLUE SHIELD | Admitting: Internal Medicine

## 2014-10-12 ENCOUNTER — Encounter: Payer: BLUE CROSS/BLUE SHIELD | Admitting: Internal Medicine

## 2014-10-15 DIAGNOSIS — Z483 Aftercare following surgery for neoplasm: Secondary | ICD-10-CM

## 2014-10-15 DIAGNOSIS — C159 Malignant neoplasm of esophagus, unspecified: Secondary | ICD-10-CM

## 2014-10-22 ENCOUNTER — Ambulatory Visit (HOSPITAL_BASED_OUTPATIENT_CLINIC_OR_DEPARTMENT_OTHER): Payer: BLUE CROSS/BLUE SHIELD | Admitting: Oncology

## 2014-10-22 VITALS — BP 131/77 | HR 76 | Temp 97.4°F | Resp 18 | Ht 70.0 in | Wt 208.0 lb

## 2014-10-22 DIAGNOSIS — C160 Malignant neoplasm of cardia: Secondary | ICD-10-CM

## 2014-10-22 DIAGNOSIS — C155 Malignant neoplasm of lower third of esophagus: Secondary | ICD-10-CM

## 2014-10-22 NOTE — Progress Notes (Signed)
[facility] OFFICE PROGRESS NOTE   Diagnosis: Esophagus cancer  INTERVAL HISTORY:   He returns as scheduled. His energy level is improving. He is walking his dog 3 times per day. No dysphagia. His oral intake remains limited, but he is tolerating a variety of foods. He saw Dr. Fanny Skates on 10/19/2014. She discussed the prognosis and treatment options with him. She does not recommend adjuvant therapy.  Objective:  Vital signs in last 24 hours:  Blood pressure 131/77, pulse 76, temperature 97.4 F (36.3 C), temperature source Oral, resp. rate 18, height 5' 10"  (1.778 m), weight 208 lb (94.348 kg).    HEENT: Neck without mass Lymphatics: No cervical, supraclavicular, or axillary nodes Resp: Lungs clear bilaterally Cardio: Regular rate and rhythm GI: No hepatomegaly, nontender Vascular: No leg edema   Lab Results:  Lab Results  Component Value Date   WBC 6.0 09/14/2014   HGB 10.6* 09/14/2014   HCT 32.7* 09/14/2014   MCV 94.8 09/14/2014   PLT 211 09/14/2014   NEUTROABS 2.2 07/16/2014    Medications: I have reviewed the patient's current medications.  Assessment/Plan: 1. Adenocarcinoma the esophagus/GE junction-status post an endoscopy 05/18/2014 confirming a gastric cardia/lower esophageal mass with tumor extending proximally in the esophagus to 25 cm from the incisors  HER-2/neu amplified   Staging CT scan 05/22/2014 with indeterminate bilateral pulmonary nodules; and prominent paraesophageal, porta hepatis, and para-aortic lymph nodes   Staging PET scan 05/29/2014 confirmed hypermetabolic soft tissue thickening at the distal third of the esophagus extending into the proximal stomach. 2 additional smaller areas of hypermetabolism or noted in the more proximal esophagus with a mildly enlarged hypermetabolic gastrohepatic node.   Initiation of radiation and concurrent Taxol/carboplatin 06/11/2014; Taxol/carboplatin completed 07/09/2014; radiation completed  07/18/2014.  Esophagectomy/jejunostomy feeding tube placement 09/05/2014 with the pathology revealing aypT3ypN2 tumor, HER-2/neu amplified, positive distal resection margin  2. History of anorexia/weight loss 3. History of solid dysphagia -improved 4. Diabetes  5. Hypertension  6. Hyperlipidemia  7. History of back pain relieved with naproxen  Disposition: Mr. Kimura continues to return to his baseline performance status following the esophagectomy. We had a long discussion regarding the indication for adjuvant therapy. I reviewed the excellent note from Dr. Fanny Skates. We agree there is no standard role for adjuvant chemotherapy in this setting. However, Mr. Brakebill is at high risk of developing recurrent disease over the next few years. I agree with Dr. Fanny Skates that it is reasonable to consider chemotherapy in this setting. The hope from chemotherapy would be to delay recurrence or potentially increase the cure rate. He understands there is no proven efficacy for chemotherapy in the setting and the potential increase in the cure rate is likely small.  We specifically discussed FOLFOX and CAPOX chemotherapy. I reviewed the potential toxicities associated with these regimens including the chance for nausea/vomiting, mucositis, diarrhea, and hematologic toxicity. We discussed the rash, hyperpigmentation, and hand/foot syndrome associated with 5-fluorouracil and capecitabine. We discussed the allergic reaction and various types of neuropathy seen with oxaliplatin.  Mr. Leedy appears to have a good understanding of his prognosis and treatment options. He would like to discuss adjuvant chemotherapy versus observation with his wife. He will contact us within the next one week with his decision regarding adjuvant therapy. I will be glad to discuss things further if additional questions arise.  We also discussed placement of a Port-A-Cath for the administration of chemotherapy.  He will be scheduled for  a return visit based on his decision to  receive adjuvant chemotherapy.       Betsy Coder, MD  10/22/2014  10:36 AM

## 2014-10-23 ENCOUNTER — Ambulatory Visit: Payer: BLUE CROSS/BLUE SHIELD | Admitting: Oncology

## 2014-10-24 ENCOUNTER — Telehealth: Payer: Self-pay | Admitting: *Deleted

## 2014-10-24 ENCOUNTER — Other Ambulatory Visit: Payer: Self-pay | Admitting: Cardiothoracic Surgery

## 2014-10-24 ENCOUNTER — Other Ambulatory Visit: Payer: Self-pay | Admitting: *Deleted

## 2014-10-24 DIAGNOSIS — C155 Malignant neoplasm of lower third of esophagus: Secondary | ICD-10-CM

## 2014-10-24 DIAGNOSIS — C61 Malignant neoplasm of prostate: Secondary | ICD-10-CM

## 2014-10-24 NOTE — Telephone Encounter (Signed)
Received call to TRIAGE from patient stating that he would like to proceed with FOLFOX chemotherapy regimen and PAC placement.   He also wanted to let Dr. Benay Spice know that he would like to be finished with these treatments by February 06, 2015.  Told patient I will pass this message along to Dr. Benay Spice and his nurse.

## 2014-10-25 ENCOUNTER — Encounter: Payer: Self-pay | Admitting: Cardiothoracic Surgery

## 2014-10-25 ENCOUNTER — Ambulatory Visit
Admission: RE | Admit: 2014-10-25 | Discharge: 2014-10-25 | Disposition: A | Discharge status: Home / Self Care | Payer: BLUE CROSS/BLUE SHIELD | Source: Ambulatory Visit | Attending: Cardiothoracic Surgery | Admitting: Cardiothoracic Surgery

## 2014-10-25 ENCOUNTER — Ambulatory Visit (INDEPENDENT_AMBULATORY_CARE_PROVIDER_SITE_OTHER): Payer: Self-pay | Admitting: Cardiothoracic Surgery

## 2014-10-25 VITALS — BP 109/72 | HR 80 | Resp 16 | Ht 70.0 in | Wt 208.0 lb

## 2014-10-25 DIAGNOSIS — C155 Malignant neoplasm of lower third of esophagus: Secondary | ICD-10-CM

## 2014-10-25 NOTE — Progress Notes (Signed)
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 SURGICAL PROCEDURE: Video bronchoscopy, transhiatal total esophagectomy with cervical esophagogastrostomy, pyloroplasty, and feeding jejunostomy. 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  1. Esophagogastrectomy, esophagus and stomach - INVASIVE ADENOCARCINOMA, MODERATELY DIFFERENTIATED, SPANNING AT LEAST 4.0 CM. - ADENOCARCINOMA EXTENDS INTO ADVENTITIAL TISSUE. - PERINEURAL INVASION IS IDENTIFIED. - ADENOCARCINOMA IS PRESENT AT THE DISTAL RESECTION MARGIN. - METASTATIC ADENOCARCINOMA IN 2 OF 6 LYMPH NODES (2/6). - 3 SOFT TISSUE DEPOSITS. - SEE ONCOLOGY TABLE BELOW. 2. Lymph node, biopsy, peri esophageal - METASTATIC ADENOCARCINOMA IN 1 OF 1 LYMPH NODE (1/1  Intraoperative frozen section margins were negative. A moderate to significant treatment effect was present. The tumor returned with HER-2 amplification.   History of Present Illness:     Patient is making good progress postoperatively. Taking his diet well with frequent small meals, he's had no reflux symptoms, no dumping syndrome. He is maintaining his weight approximately 206 pounds. Patient feels well. No trouble  swallowing. He has had second option at [facility] concerning postop chemo, all will proceed with Folfox.     Past Medical History  Diagnosis Date  . Hyperlipidemia   . Obesity     lost 80 lbs  . Hypogonadism male   . Vitamin D deficiency   . 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 Allergy     HEY FEVER  . GERD (gastroesophageal reflux disease)   . S/P radiation therapy 06/11/2014-07/18/2014  . Hypertension     no med in 86month due to weight loss  . Diabetes mellitus without complication     type II no med 60 days      History  Smoking status  . Current Some Day Smoker -- 0.25 packs/day for 3 years  . Types: Cigars  Smokeless tobacco  . Never Used    Comment: has a cigar occasionally    History  Alcohol Use  . 1.0 oz/week  . 2 Standard drinks or equivalent per week    Comment: social     Allergies  Allergen Reactions  . Ace Inhibitors     cough    Current Outpatient Prescriptions  Medication Sig Dispense Refill  . acetaminophen (TYLENOL) 160 MG/5ML solution Take 10.2 mLs (325 mg total) by mouth every 4 (four) hours as needed for moderate pain. 120 mL 0  . oxyCODONE (ROXICODONE) 5 MG/5ML solution Take 5 mLs (5 mg total) by mouth every 4 (four) hours as needed for moderate pain or severe pain. 473 mL 0  . pantoprazole sodium (PROTONIX) 40 mg/20 mL PACK Take 20 mLs (40 mg total) by mouth 2 (two) times daily. 60 each 1  .  testosterone (ANDROGEL) 50 MG/5GM GEL Place 5 g onto the skin daily. Apply 2 pumps daily  Dispense 1pump per month 1 Package 5   No current facility-administered medications for this visit.   Wt Readings from Last 3 Encounters:  10/25/14 208 lb (94.348 kg)  10/22/14 208 lb (94.348 kg)  09/27/14 206 lb (93.441 kg)   Physical Exam: BP 109/72 mmHg  Pulse 80  Resp 16  Ht 5' 10"  (1.778 m)  Wt 208 lb (94.348 kg)  BMI 29.84 kg/m2  SpO2 98%  General appearance: alert, cooperative and appears stated age Neurologic: intact Heart: regular rate and  rhythm, S1, S2 normal, no murmur, click, rub or gallop Lungs: clear to auscultation bilaterally Abdomen: soft, non-tender; bowel sounds normal; no masses,  no organomegaly and Abdominal wound is healing well Extremities: extremities normal, atraumatic, no cyanosis or edema and Homans sign is negative, no sign of DVT Wound: Left neck incision is healing well Patient's feeding jejunostomy tube, is removed today without difficulty in its entirety   Diagnostic Studies & Laboratory data:     Recent Radiology Findings:   Dg Chest 2 View  10/25/2014   CLINICAL DATA:  Known esophageal carcinoma with prior radiation and chemotherapy  EXAM: CHEST  2 VIEW  COMPARISON:  09/27/2014  FINDINGS: Cardiac shadow is within normal limits. The lungs are well aerated bilaterally. The previously seen left-sided bronchitic changes have resolved in the interval. No focal infiltrate is seen. No acute bony abnormality is noted.  IMPRESSION: No active cardiopulmonary disease.   Electronically Signed   By: Inez Catalina M.D.   On: 10/25/2014 13:13      Recent Lab Findings: Lab Results  Component Value Date   WBC 6.0 09/14/2014   HGB 10.6* 09/14/2014   HCT 32.7* 09/14/2014   PLT 211 09/14/2014   GLUCOSE 133* 09/14/2014   ALT 17 09/03/2014   AST 23 09/03/2014   NA 137 09/14/2014   K 4.3 09/14/2014   CL 102 09/14/2014   CREATININE 0.61 09/14/2014   BUN 10 09/14/2014   CO2 30 09/14/2014   INR 1.12 09/03/2014      Assessment / Plan:      1. Adenocarcinoma the esophagus/GE junction-status post an endoscopy 05/18/2014 confirming a gastric cardia/lower esophageal mass with tumor extending proximally in the esophagus to 25 cm from the incisors             doing well after resection, because of extensive disease and microscopic positive margin ( negative on frozen section) considering Folfox.              Will see patient in 3 months after ct ordered by radiation oncology  [name] MD      [address] [place] [address] Office [phone]   Beeper [phone]  10/25/2014 3:03 PM

## 2014-10-26 ENCOUNTER — Telehealth: Payer: Self-pay | Admitting: *Deleted

## 2014-10-26 ENCOUNTER — Other Ambulatory Visit: Payer: Self-pay | Admitting: Radiology

## 2014-10-26 ENCOUNTER — Telehealth: Payer: Self-pay | Admitting: Oncology

## 2014-10-26 NOTE — Telephone Encounter (Signed)
Per staff message and POF I have scheduled appts. Advised scheduler of appts. JMW  

## 2014-10-26 NOTE — Telephone Encounter (Signed)
Let me know when port will be placed and date of folfox I will put in orders

## 2014-10-26 NOTE — Telephone Encounter (Signed)
Per staff message and POF I have scheduled appts. Advised scheduler of appts. Unable to schedule 3/1 appt due to the patient has another MD appt. JMW

## 2014-10-26 NOTE — Telephone Encounter (Signed)
Lft msg for pt confirming labs/ov and that chemo has been added also mailed sch to pt... KJ

## 2014-10-29 ENCOUNTER — Telehealth: Payer: Self-pay | Admitting: *Deleted

## 2014-10-29 ENCOUNTER — Other Ambulatory Visit: Payer: Self-pay | Admitting: Radiology

## 2014-10-29 DIAGNOSIS — C155 Malignant neoplasm of lower third of esophagus: Secondary | ICD-10-CM

## 2014-10-29 NOTE — Telephone Encounter (Signed)
Left message on voicemail for pt to call office. There is availability for chemo on 2/25 with Sat pump disconnect. Per Dr. Benay Spice: Would rather start this sooner than currently scheduled. Pt will need to see MD prior to Cycle #2. Will send orders once confirmed with pt he is available to start chemo 2/25.

## 2014-10-29 NOTE — Progress Notes (Signed)
See MyChart message

## 2014-10-30 ENCOUNTER — Other Ambulatory Visit: Payer: Self-pay | Admitting: Oncology

## 2014-10-30 ENCOUNTER — Ambulatory Visit (HOSPITAL_COMMUNITY)
Admission: RE | Admit: 2014-10-30 | Discharge: 2014-10-30 | Disposition: A | Discharge status: Home / Self Care | Payer: BLUE CROSS/BLUE SHIELD | Source: Ambulatory Visit | Attending: Oncology | Admitting: Oncology

## 2014-10-30 ENCOUNTER — Ambulatory Visit (HOSPITAL_COMMUNITY)
Admission: RE | Admit: 2014-10-30 | Discharge: 2014-10-30 | Disposition: A | Discharge status: Home / Self Care | Payer: BLUE CROSS/BLUE SHIELD | Source: Ambulatory Visit | Attending: Diagnostic Radiology | Admitting: Diagnostic Radiology

## 2014-10-30 ENCOUNTER — Other Ambulatory Visit: Payer: Self-pay | Admitting: *Deleted

## 2014-10-30 ENCOUNTER — Encounter (HOSPITAL_COMMUNITY): Payer: Self-pay

## 2014-10-30 ENCOUNTER — Telehealth: Payer: Self-pay | Admitting: Oncology

## 2014-10-30 DIAGNOSIS — Z9049 Acquired absence of other specified parts of digestive tract: Secondary | ICD-10-CM | POA: Insufficient documentation

## 2014-10-30 DIAGNOSIS — K219 Gastro-esophageal reflux disease without esophagitis: Secondary | ICD-10-CM | POA: Insufficient documentation

## 2014-10-30 DIAGNOSIS — E785 Hyperlipidemia, unspecified: Secondary | ICD-10-CM | POA: Insufficient documentation

## 2014-10-30 DIAGNOSIS — E559 Vitamin D deficiency, unspecified: Secondary | ICD-10-CM | POA: Insufficient documentation

## 2014-10-30 DIAGNOSIS — I10 Essential (primary) hypertension: Secondary | ICD-10-CM | POA: Diagnosis not present

## 2014-10-30 DIAGNOSIS — C61 Malignant neoplasm of prostate: Secondary | ICD-10-CM

## 2014-10-30 DIAGNOSIS — C155 Malignant neoplasm of lower third of esophagus: Secondary | ICD-10-CM | POA: Diagnosis not present

## 2014-10-30 DIAGNOSIS — C159 Malignant neoplasm of esophagus, unspecified: Secondary | ICD-10-CM

## 2014-10-30 DIAGNOSIS — Z79899 Other long term (current) drug therapy: Secondary | ICD-10-CM | POA: Diagnosis not present

## 2014-10-30 DIAGNOSIS — E119 Type 2 diabetes mellitus without complications: Secondary | ICD-10-CM | POA: Diagnosis not present

## 2014-10-30 DIAGNOSIS — F17210 Nicotine dependence, cigarettes, uncomplicated: Secondary | ICD-10-CM | POA: Diagnosis not present

## 2014-10-30 LAB — BASIC METABOLIC PANEL
Anion gap: 9 (ref 5–15)
BUN: 14 mg/dL (ref 6–23)
CO2: 27 mmol/L (ref 19–32)
Calcium: 9.3 mg/dL (ref 8.4–10.5)
Chloride: 105 mmol/L (ref 96–112)
Creatinine, Ser: 0.78 mg/dL (ref 0.50–1.35)
GFR calc non Af Amer: >90 mL/min (ref >90)
GLUCOSE: 91 mg/dL (ref 70–99)
POTASSIUM: 4 mmol/L (ref 3.5–5.1)
SODIUM: 141 mmol/L (ref 135–145)

## 2014-10-30 LAB — CBC
HCT: 39.4 % (ref 39.0–52.0)
Hemoglobin: 12.7 g/dL — ABNORMAL LOW (ref 13.0–17.0)
MCH: 30.9 pg (ref 26.0–34.0)
MCHC: 32.2 g/dL (ref 30.0–36.0)
MCV: 95.9 fL (ref 78.0–100.0)
PLATELETS: 196 10*3/uL (ref 150–400)
RBC: 4.11 MIL/uL — ABNORMAL LOW (ref 4.22–5.81)
RDW: 12.5 % (ref 11.5–15.5)
WBC: 4.3 10*3/uL (ref 4.0–10.5)

## 2014-10-30 LAB — PROTIME-INR
INR: 1.11 (ref 0.00–1.49)
Prothrombin Time: 14.4 seconds (ref 11.6–15.2)

## 2014-10-30 LAB — GLUCOSE, CAPILLARY: Glucose-Capillary: 74 mg/dL (ref 70–99)

## 2014-10-30 LAB — APTT: APTT: 33 s (ref 24–37)

## 2014-10-30 MED ORDER — FENTANYL CITRATE 0.05 MG/ML IJ SOLN
INTRAMUSCULAR | Status: AC | PRN
  Administered 2014-10-30: 25 ug via INTRAVENOUS
  Administered 2014-10-30: 50 ug via INTRAVENOUS

## 2014-10-30 MED ORDER — HEPARIN SOD (PORK) LOCK FLUSH 100 UNIT/ML IV SOLN
INTRAVENOUS | Status: AC
  Filled 2014-10-30: qty 5

## 2014-10-30 MED ORDER — MIDAZOLAM HCL 2 MG/2ML IJ SOLN
INTRAMUSCULAR | Status: AC
  Filled 2014-10-30: qty 6

## 2014-10-30 MED ORDER — FENTANYL CITRATE 0.05 MG/ML IJ SOLN
INTRAMUSCULAR | Status: AC
  Filled 2014-10-30: qty 4

## 2014-10-30 MED ORDER — HEPARIN SOD (PORK) LOCK FLUSH 100 UNIT/ML IV SOLN
INTRAVENOUS | Status: AC | PRN
  Administered 2014-10-30: 500 [IU]

## 2014-10-30 MED ORDER — LIDOCAINE HCL 1 % IJ SOLN
INTRAMUSCULAR | Status: AC
  Filled 2014-10-30: qty 20

## 2014-10-30 MED ORDER — DEXTROSE 5 % IV SOLN
INTRAVENOUS | Status: DC
  Administered 2014-10-30: 13:00:00 via INTRAVENOUS

## 2014-10-30 MED ORDER — MIDAZOLAM HCL 2 MG/2ML IJ SOLN
INTRAMUSCULAR | Status: AC | PRN
  Administered 2014-10-30 (×4): 1 mg via INTRAVENOUS

## 2014-10-30 MED ORDER — CEFAZOLIN SODIUM-DEXTROSE 2-3 GM-% IV SOLR
INTRAVENOUS | Status: AC
  Filled 2014-10-30: qty 50

## 2014-10-30 MED ORDER — SODIUM CHLORIDE 0.9 % IV SOLN
Freq: Once | INTRAVENOUS | Status: AC
  Administered 2014-10-30: 12:00:00 via INTRAVENOUS

## 2014-10-30 MED ORDER — LIDOCAINE-EPINEPHRINE 2 %-1:100000 IJ SOLN
INTRAMUSCULAR | Status: AC
  Filled 2014-10-30: qty 1

## 2014-10-30 MED ORDER — CEFAZOLIN SODIUM-DEXTROSE 2-3 GM-% IV SOLR
2.0000 g | Freq: Once | INTRAVENOUS | Status: AC
  Administered 2014-10-30: 2 g via INTRAVENOUS

## 2014-10-30 NOTE — Sedation Documentation (Addendum)
Dr. Anselm Pancoast delayed in prior procedure. Pt updated and understanding without complaints.

## 2014-10-30 NOTE — Progress Notes (Signed)
Paged Hermine Messick PA x 2 w/o return call.  Then called Dr Jeronimo Norma in IR re: CBG of 74, nonsymptomatic.

## 2014-10-30 NOTE — Procedures (Signed)
Placement of right jugular portacath.  Tip at SVC/RA junction.  Ready to use.  No immediate complication.  See Imaging report.

## 2014-10-30 NOTE — Sedation Documentation (Signed)
Patient is resting comfortably. 

## 2014-10-30 NOTE — Telephone Encounter (Signed)
added and cx appts per pof....per pof pt comming to get sched

## 2014-10-30 NOTE — Telephone Encounter (Signed)
RETURN CALL FROM PT. HE WILL BE AVAILABLE TO START CHEMO ON 11/01/14. PT. WILL BE IN RADIOLOGY TODAY FOR A PORT PLACEMENT FROM 11:30-3:30. HE PLANS TO COME BY SCHEDULING TO SET UP FUTURE CYCLES.

## 2014-10-30 NOTE — Discharge Instructions (Signed)

## 2014-10-30 NOTE — H&P (Signed)
Chief Complaint: "I am here for a port."  Referring Physician(s): Sherrill,Gary B  History of Present Illness: Joel Miller is a 59 y.o. male with adenocarcinoma of Esophagus who has been seen by Dr. Benay Spice on 10/22/14 and scheduled today for image guided port a catheter placement. He denies any chest pain, shortness of breath or palpitations. He denies any active signs of bleeding or excessive bruising. He denies any recent fever or chills. The patient denies any history of sleep apnea or chronic oxygen use. He has previously tolerated sedation without complications.    Past Medical History  Diagnosis Date  . Hyperlipidemia   . Obesity     lost 80 lbs  . Hypogonadism male   . Vitamin D deficiency   . 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 Allergy     HEY FEVER  . GERD (gastroesophageal reflux disease)   . S/P radiation therapy 06/11/2014-07/18/2014  . Hypertension     no med in 70months due to weight loss  . Diabetes mellitus without complication     type II no med 60 days     Past Surgical History  Procedure Laterality Date  . Colonoscopy    . 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 Complete esophagectomy N/A 09/05/2014    Procedure: TRANSHIATAL TOTAL ESOPHAGECTOMY; Cervical esophagogastrostomy;  Surgeon: [name], MD;  Location: [facility];  Service: Thoracic;  Laterality: N/A;  . Video bronchoscopy N/A 09/05/2014    Procedure: VIDEO BRONCHOSCOPY;  Surgeon: [name], MD;  Location: [facility];  Service: Thoracic;  Laterality: N/A;  . Jejunostomy N/A 09/05/2014    Procedure: FEEDING JEJUNOSTOMY;  Surgeon: [name], MD;  Location: [facility];  Service: Thoracic;  Laterality: N/A;  . Pyloroplasty N/A 09/05/2014    Procedure: PYLOROPLASTY;  Surgeon: [name], MD;  Location: [facility] OR;  Service: Thoracic;  Laterality: N/A;    Allergies: Ace inhibitors  Medications: Prior to Admission medications   Medication Sig Start Date End Date Taking? Authorizing  Provider  aspirin 81 MG tablet Take 81 mg by mouth daily.   Yes Historical Provider, MD  Cholecalciferol (VITAMIN D PO) Take 10,000 Units by mouth daily.   Yes Historical Provider, MD  Naproxen Sodium 220 MG CAPS Take 1 capsule by mouth daily as needed (pain).   Yes Historical Provider, MD  oxyCODONE (ROXICODONE) 5 MG/5ML solution Take 5 mLs (5 mg total) by mouth every 4 (four) hours as needed for moderate pain or severe pain. 09/15/14  Yes Erin Barrett, PA-C  pantoprazole sodium (PROTONIX) 40 mg/20 mL PACK Take 20 mLs (40 mg total) by mouth 2 (two) times daily. Patient taking differently: Take 40 mg by mouth 2 (two) times daily as needed (heartburn and indigestion).  09/15/14  Yes Erin Barrett, PA-C  testosterone (ANDROGEL) 50 MG/5GM GEL Place 5 g onto the skin daily. Apply 2 pumps daily  Dispense 1pump per month 10/09/13  Yes Unk Pinto, MD  acetaminophen (TYLENOL) 160 MG/5ML solution Take 10.2 mLs (325 mg total) by mouth every 4 (four) hours as needed for moderate pain. Patient not taking: Reported on 10/30/2014 09/15/14   Ellwood Handler, PA-C     Family History  Problem Relation Age of Onset  . Hypertension Mother   . Alzheimer's disease Mother   . Cancer Father 79    throat    History   Social History  . Marital Status: Married    Spouse Name: N/A  . Number  of Children: 3  . Years of Education: N/A   Occupational History  . Sales Development worker, community    Social History Main Topics  . Smoking status: Current Some Day Smoker -- 0.25 packs/day for 3 years    Types: Cigars  . Smokeless tobacco: Never Used     Comment: has a cigar occasionally  . Alcohol Use: 1.0 oz/week    2 Standard drinks or equivalent per week     Comment: social  . Drug Use: No  . Sexual Activity: Not on file   Other Topics Concern  . None   Social History Narrative    Review of Systems: A 12 point ROS discussed and pertinent positives are indicated in the HPI above.  All other systems are  negative.  Review of Systems  Vital Signs: Ht 5\' 10"  (1.778 m)  Wt 206 lb (93.441 kg)  BMI 29.56 kg/m2 T: 97.88F, BP: 120/70 mmHg, HR: 72bpm, o2: 100% RA  Physical Exam  Constitutional: He is oriented to person, place, and time. No distress.  HENT:  Head: Normocephalic and atraumatic.  Neck: No tracheal deviation present.  Cardiovascular: Normal rate and regular rhythm.  Exam reveals no gallop and no friction rub.   No murmur heard. Pulmonary/Chest: Effort normal and breath sounds normal. No respiratory distress. He has no wheezes. He has no rales.  Abdominal: Soft. Bowel sounds are normal. He exhibits no distension. There is no tenderness.  Neurological: He is alert and oriented to person, place, and time.  Skin: Skin is warm and dry. He is not diaphoretic.  Psychiatric: He has a normal mood and affect. His behavior is normal. Thought content normal.   Mallampati Score:  MD Evaluation Airway: WNL Heart: WNL Abdomen: WNL Chest/ Lungs: WNL ASA  Classification: 3 Mallampati/Airway Score: Two  Imaging: Dg Chest 2 View  10/25/2014   CLINICAL DATA:  Known esophageal carcinoma with prior radiation and chemotherapy  EXAM: CHEST  2 VIEW  COMPARISON:  09/27/2014  FINDINGS: Cardiac shadow is within normal limits. The lungs are well aerated bilaterally. The previously seen left-sided bronchitic changes have resolved in the interval. No focal infiltrate is seen. No acute bony abnormality is noted.  IMPRESSION: No active cardiopulmonary disease.   Electronically Signed   By: Inez Catalina M.D.   On: 10/25/2014 13:13    Labs:  CBC:  Recent Labs  09/08/14 0430 09/10/14 0425 09/14/14 0435 10/30/14 1156  WBC 6.2 4.7 6.0 4.3  HGB 10.2* 10.5* 10.6* 12.7*  HCT 30.9* 31.8* 32.7* 39.4  PLT 180 203 211 196    COAGS:  Recent Labs  09/03/14 1131 10/30/14 1156  INR 1.12 1.11  APTT 34 33    BMP:  Recent Labs  09/09/14 0400 09/10/14 0425 09/14/14 0435 10/30/14 1156  NA 137  140 137 141  K 3.5 3.7 4.3 4.0  CL 103 106 102 105  CO2 28 30 30 27   GLUCOSE 128* 125* 133* 91  BUN 6 7 10 14   CALCIUM 8.6 8.8 8.8 9.3  CREATININE 0.69 0.71 0.61 0.78  GFRNONAA >90 >90 >90 >90  GFRAA >90 >90 >90 >90    LIVER FUNCTION TESTS:  Recent Labs  05/31/14 1359 06/18/14 1023 06/25/14 0857 09/03/14 1131  BILITOT 0.43 0.67 0.50 0.6  AST 17 20 19 23   ALT 16 17 22 17   ALKPHOS 61 51 48 61  PROT 6.7 7.1 6.3* 6.5  ALBUMIN 3.8 3.8 3.6 3.6    TUMOR MARKERS:  Recent Labs  05/31/14 1359  CEA <0.5    Assessment and Plan: Adenocarcinoma of Esophagus  Seen by Dr. Benay Spice on 10/22/14 Scheduled today for image guided port a catheter placement with moderate sedation Patient has been NPO, no blood thinners taken, afebrile, labs reviewed Risks and Benefits discussed with the patient including, but not limited to bleeding, infection, pneumothorax, fibrin sheath development and need for exchange. All of the patient's questions were answered, patient is agreeable to proceed. Consent signed and in chart.   Thank you for this interesting consult.  I greatly enjoyed meeting DEVANTE CAPANO and look forward to participating in their care.  SignedHedy Jacob 10/30/2014, 1:51 PM   I spent a total of 20 Minutes in face to face in clinical consultation, greater than 50% of which was counseling/coordinating care for Esophageal cancer.

## 2014-10-31 ENCOUNTER — Other Ambulatory Visit: Payer: Self-pay | Admitting: *Deleted

## 2014-10-31 DIAGNOSIS — C159 Malignant neoplasm of esophagus, unspecified: Secondary | ICD-10-CM

## 2014-10-31 MED ORDER — LIDOCAINE-PRILOCAINE 2.5-2.5 % EX CREA: TOPICAL_CREAM | CUTANEOUS | Status: DC

## 2014-10-31 MED ORDER — PROCHLORPERAZINE MALEATE 10 MG PO TABS: 10.0000 mg | ORAL_TABLET | Freq: Four times a day (QID) | ORAL | Status: DC | PRN

## 2014-10-31 NOTE — Telephone Encounter (Addendum)
Called pt with Compazine and EMLA instructions. He voiced understanding. Informed pt treatment nurse will apply ice to site for first treatment, so as not to disturb the incision closure with cream.

## 2014-11-01 ENCOUNTER — Telehealth: Payer: Self-pay | Admitting: *Deleted

## 2014-11-01 ENCOUNTER — Other Ambulatory Visit: Payer: Self-pay | Admitting: *Deleted

## 2014-11-01 ENCOUNTER — Other Ambulatory Visit (HOSPITAL_BASED_OUTPATIENT_CLINIC_OR_DEPARTMENT_OTHER): Payer: BLUE CROSS/BLUE SHIELD

## 2014-11-01 ENCOUNTER — Ambulatory Visit (HOSPITAL_BASED_OUTPATIENT_CLINIC_OR_DEPARTMENT_OTHER): Payer: BLUE CROSS/BLUE SHIELD

## 2014-11-01 DIAGNOSIS — C155 Malignant neoplasm of lower third of esophagus: Secondary | ICD-10-CM

## 2014-11-01 DIAGNOSIS — C160 Malignant neoplasm of cardia: Secondary | ICD-10-CM

## 2014-11-01 DIAGNOSIS — Z5111 Encounter for antineoplastic chemotherapy: Secondary | ICD-10-CM

## 2014-11-01 DIAGNOSIS — C61 Malignant neoplasm of prostate: Secondary | ICD-10-CM

## 2014-11-01 LAB — COMPREHENSIVE METABOLIC PANEL (CC13)
ALBUMIN: 3.5 g/dL (ref 3.5–5.0)
ALT: 6 U/L (ref 0–55)
AST: 15 U/L (ref 5–34)
Alkaline Phosphatase: 72 U/L (ref 40–150)
Anion Gap: 7 mEq/L (ref 3–11)
BILIRUBIN TOTAL: 0.32 mg/dL (ref 0.20–1.20)
BUN: 13.7 mg/dL (ref 7.0–26.0)
CHLORIDE: 105 meq/L (ref 98–109)
CO2: 25 meq/L (ref 22–29)
CREATININE: 0.8 mg/dL (ref 0.7–1.3)
Calcium: 9.4 mg/dL (ref 8.4–10.4)
EGFR: >90 mL/min/{1.73_m2} (ref >90)
Glucose: 108 mg/dl (ref 70–140)
Potassium: 4.1 mEq/L (ref 3.5–5.1)
Sodium: 137 mEq/L (ref 136–145)
TOTAL PROTEIN: 6.4 g/dL (ref 6.4–8.3)

## 2014-11-01 LAB — CBC WITH DIFFERENTIAL/PLATELET
BASO%: 0.5 % (ref 0.0–2.0)
Basophils Absolute: 0 10*3/uL (ref 0.0–0.1)
EOS ABS: 0.1 10*3/uL (ref 0.0–0.5)
EOS%: 1.4 % (ref 0.0–7.0)
HCT: 38.1 % — ABNORMAL LOW (ref 38.4–49.9)
HGB: 12.5 g/dL — ABNORMAL LOW (ref 13.0–17.1)
LYMPH#: 0.4 10*3/uL — AB (ref 0.9–3.3)
LYMPH%: 9.3 % — AB (ref 14.0–49.0)
MCH: 30.8 pg (ref 27.2–33.4)
MCHC: 32.8 g/dL (ref 32.0–36.0)
MCV: 93.8 fL (ref 79.3–98.0)
MONO#: 0.3 10*3/uL (ref 0.1–0.9)
MONO%: 6.8 % (ref 0.0–14.0)
NEUT#: 3.6 10*3/uL (ref 1.5–6.5)
NEUT%: 82 % — ABNORMAL HIGH (ref 39.0–75.0)
PLATELETS: 162 10*3/uL (ref 140–400)
RBC: 4.06 10*6/uL — AB (ref 4.20–5.82)
RDW: 12.4 % (ref 11.0–14.6)
WBC: 4.4 10*3/uL (ref 4.0–10.3)

## 2014-11-01 MED ORDER — DEXTROSE 5 % IV SOLN
Freq: Once | INTRAVENOUS | Status: AC
  Administered 2014-11-01: 13:00:00 via INTRAVENOUS

## 2014-11-01 MED ORDER — SODIUM CHLORIDE 0.9 % IV SOLN
2325.0000 mg/m2 | INTRAVENOUS | Status: DC
  Filled 2014-11-01: qty 100

## 2014-11-01 MED ORDER — FLUOROURACIL CHEMO INJECTION 2.5 GM/50ML
400.0000 mg/m2 | Freq: Once | INTRAVENOUS | Status: AC
  Administered 2014-11-01: 850 mg via INTRAVENOUS
  Filled 2014-11-01: qty 17

## 2014-11-01 MED ORDER — ONDANSETRON 8 MG/NS 50 ML IVPB
INTRAVENOUS | Status: AC
  Filled 2014-11-01: qty 8

## 2014-11-01 MED ORDER — OXALIPLATIN CHEMO INJECTION 100 MG/20ML
85.0000 mg/m2 | Freq: Once | INTRAVENOUS | Status: AC
  Administered 2014-11-01: 185 mg via INTRAVENOUS
  Filled 2014-11-01: qty 37

## 2014-11-01 MED ORDER — DEXAMETHASONE SODIUM PHOSPHATE 10 MG/ML IJ SOLN
INTRAMUSCULAR | Status: AC
  Filled 2014-11-01: qty 1

## 2014-11-01 MED ORDER — DEXAMETHASONE SODIUM PHOSPHATE 10 MG/ML IJ SOLN
10.0000 mg | Freq: Once | INTRAMUSCULAR | Status: AC
  Administered 2014-11-01: 10 mg via INTRAVENOUS

## 2014-11-01 MED ORDER — SODIUM CHLORIDE 0.9 % IV SOLN
2325.0000 mg/m2 | INTRAVENOUS | Status: DC
  Administered 2014-11-01: 5000 mg via INTRAVENOUS
  Filled 2014-11-01: qty 100

## 2014-11-01 MED ORDER — LEUCOVORIN CALCIUM INJECTION 350 MG
400.0000 mg/m2 | Freq: Once | INTRAVENOUS | Status: AC
  Administered 2014-11-01: 860 mg via INTRAVENOUS
  Filled 2014-11-01: qty 43

## 2014-11-01 MED ORDER — ONDANSETRON 8 MG/50ML IVPB (CHCC)
8.0000 mg | Freq: Once | INTRAVENOUS | Status: AC
  Administered 2014-11-01: 8 mg via INTRAVENOUS

## 2014-11-01 NOTE — Telephone Encounter (Signed)
-----   Message from Mariam Dollar sent at 11/01/2014  9:27 AM EST ----- Regarding: RE: Prior Auth for chemo No auth req. Thanks Candies Palm. Raquel ----- Message -----    From: Ludwig Lean, RN    Sent: 10/31/2014  10:03 AM      To: Mariam Dollar Subject: Prior Auth for chemo                           Pt is to begin FOLFOX on 2/25. Please obtain prior auth. Thanks! Lavella Lemons

## 2014-11-01 NOTE — Telephone Encounter (Signed)
Per staff message and POF I have scheduled appts. Advised scheduler of appts of first able appt given at 12pm . JMW

## 2014-11-01 NOTE — Patient Instructions (Signed)
[facility] Discharge Instructions for Patients Receiving Chemotherapy  Today you received the following chemotherapy agents oxaliplatin/leucovorin/fluorouracil.    To help prevent nausea and vomiting after your treatment, we encourage you to take your nausea medication as directed   If you develop nausea and vomiting that is not controlled by your nausea medication, call the clinic.   BELOW ARE SYMPTOMS THAT SHOULD BE REPORTED IMMEDIATELY:  *FEVER GREATER THAN 100.5 F  *CHILLS WITH OR WITHOUT FEVER  NAUSEA AND VOMITING THAT IS NOT CONTROLLED WITH YOUR NAUSEA MEDICATION  *UNUSUAL SHORTNESS OF BREATH  *UNUSUAL BRUISING OR BLEEDING  TENDERNESS IN MOUTH AND THROAT WITH OR WITHOUT PRESENCE OF ULCERS  *URINARY PROBLEMS  *BOWEL PROBLEMS  UNUSUAL RASH Items with * indicate a potential emergency and should be followed up as soon as possible.  Feel free to call the clinic you have any questions or concerns. The clinic phone number is [phone].  Oxaliplatin Injection What is this medicine? OXALIPLATIN (ox AL i PLA tin) is a chemotherapy drug. It targets fast dividing cells, like cancer cells, and causes these cells to die. This medicine is used to treat cancers of the colon and rectum, and many other cancers. This medicine may be used for other purposes; ask your health care provider or pharmacist if you have questions. COMMON BRAND NAME(S): Eloxatin What should I tell my health care provider before I take this medicine? They need to know if you have any of these conditions: -kidney disease -an unusual or allergic reaction to oxaliplatin, other chemotherapy, other medicines, foods, dyes, or preservatives -pregnant or trying to get pregnant -breast-feeding How should I use this medicine? This drug is given as an infusion into a vein. It is administered in a hospital or clinic by a specially trained health care professional. Talk to your pediatrician regarding the  use of this medicine in children. Special care may be needed. Overdosage: If you think you have taken too much of this medicine contact a poison control center or emergency room at once. NOTE: This medicine is only for you. Do not share this medicine with others. What if I miss a dose? It is important not to miss a dose. Call your doctor or health care professional if you are unable to keep an appointment. What may interact with this medicine? -medicines to increase blood counts like filgrastim, pegfilgrastim, sargramostim -probenecid -some antibiotics like amikacin, gentamicin, neomycin, polymyxin B, streptomycin, tobramycin -zalcitabine Talk to your doctor or health care professional before taking any of these medicines: -acetaminophen -aspirin -ibuprofen -ketoprofen -naproxen This list may not describe all possible interactions. Give your health care provider a list of all the medicines, herbs, non-prescription drugs, or dietary supplements you use. Also tell them if you smoke, drink alcohol, or use illegal drugs. Some items may interact with your medicine. What should I watch for while using this medicine? Your condition will be monitored carefully while you are receiving this medicine. You will need important blood work done while you are taking this medicine. This medicine can make you more sensitive to cold. Do not drink cold drinks or use ice. Cover exposed skin before coming in contact with cold temperatures or cold objects. When out in cold weather wear warm clothing and cover your mouth and nose to warm the air that goes into your lungs. Tell your doctor if you get sensitive to the cold. This drug may make you feel generally unwell. This is not uncommon, as chemotherapy can affect healthy cells  as well as cancer cells. Report any side effects. Continue your course of treatment even though you feel ill unless your doctor tells you to stop. In some cases, you may be given additional  medicines to help with side effects. Follow all directions for their use. Call your doctor or health care professional for advice if you get a fever, chills or sore throat, or other symptoms of a cold or flu. Do not treat yourself. This drug decreases your body's ability to fight infections. Try to avoid being around people who are sick. This medicine may increase your risk to bruise or bleed. Call your doctor or health care professional if you notice any unusual bleeding. Be careful brushing and flossing your teeth or using a toothpick because you may get an infection or bleed more easily. If you have any dental work done, tell your dentist you are receiving this medicine. Avoid taking products that contain aspirin, acetaminophen, ibuprofen, naproxen, or ketoprofen unless instructed by your doctor. These medicines may hide a fever. Do not become pregnant while taking this medicine. Women should inform their doctor if they wish to become pregnant or think they might be pregnant. There is a potential for serious side effects to an unborn child. Talk to your health care professional or pharmacist for more information. Do not breast-feed an infant while taking this medicine. Call your doctor or health care professional if you get diarrhea. Do not treat yourself. What side effects may I notice from receiving this medicine? Side effects that you should report to your doctor or health care professional as soon as possible: -allergic reactions like skin rash, itching or hives, swelling of the face, lips, or tongue -low blood counts - This drug may decrease the number of white blood cells, red blood cells and platelets. You may be at increased risk for infections and bleeding. -signs of infection - fever or chills, cough, sore throat, pain or difficulty passing urine -signs of decreased platelets or bleeding - bruising, pinpoint red spots on the skin, black, tarry stools, nosebleeds -signs of decreased red  blood cells - unusually weak or tired, fainting spells, lightheadedness -breathing problems -chest pain, pressure -cough -diarrhea -jaw tightness -mouth sores -nausea and vomiting -pain, swelling, redness or irritation at the injection site -pain, tingling, numbness in the hands or feet -problems with balance, talking, walking -redness, blistering, peeling or loosening of the skin, including inside the mouth -trouble passing urine or change in the amount of urine Side effects that usually do not require medical attention (report to your doctor or health care professional if they continue or are bothersome): -changes in vision -constipation -hair loss -loss of appetite -metallic taste in the mouth or changes in taste -stomach pain This list may not describe all possible side effects. Call your doctor for medical advice about side effects. You may report side effects to FDA at 1-800-FDA-1088. Where should I keep my medicine? This drug is given in a hospital or clinic and will not be stored at home. NOTE: This sheet is a summary. It may not cover all possible information. If you have questions about this medicine, talk to your doctor, pharmacist, or health care provider.  2015, Elsevier/Gold Standard. (2008-03-20 17:22:47)   Fluorouracil, 5-FU injection What is this medicine? FLUOROURACIL, 5-FU (flure oh YOOR a sil) is a chemotherapy drug. It slows the growth of cancer cells. This medicine is used to treat many types of cancer like breast cancer, colon or rectal cancer, pancreatic cancer, and stomach  cancer. This medicine may be used for other purposes; ask your health care provider or pharmacist if you have questions. COMMON BRAND NAME(S): Adrucil What should I tell my health care provider before I take this medicine? They need to know if you have any of these conditions: -blood disorders -dihydropyrimidine dehydrogenase (DPD) deficiency -infection (especially a virus infection  such as chickenpox, cold sores, or herpes) -kidney disease -liver disease -malnourished, poor nutrition -recent or ongoing radiation therapy -an unusual or allergic reaction to fluorouracil, other chemotherapy, other medicines, foods, dyes, or preservatives -pregnant or trying to get pregnant -breast-feeding How should I use this medicine? This drug is given as an infusion or injection into a vein. It is administered in a hospital or clinic by a specially trained health care professional. Talk to your pediatrician regarding the use of this medicine in children. Special care may be needed. Overdosage: If you think you have taken too much of this medicine contact a poison control center or emergency room at once. NOTE: This medicine is only for you. Do not share this medicine with others. What if I miss a dose? It is important not to miss your dose. Call your doctor or health care professional if you are unable to keep an appointment. What may interact with this medicine? -allopurinol -cimetidine -dapsone -digoxin -hydroxyurea -leucovorin -levamisole -medicines for seizures like ethotoin, fosphenytoin, phenytoin -medicines to increase blood counts like filgrastim, pegfilgrastim, sargramostim -medicines that treat or prevent blood clots like warfarin, enoxaparin, and dalteparin -methotrexate -metronidazole -pyrimethamine -some other chemotherapy drugs like busulfan, cisplatin, estramustine, vinblastine -trimethoprim -trimetrexate -vaccines Talk to your doctor or health care professional before taking any of these medicines: -acetaminophen -aspirin -ibuprofen -ketoprofen -naproxen This list may not describe all possible interactions. Give your health care provider a list of all the medicines, herbs, non-prescription drugs, or dietary supplements you use. Also tell them if you smoke, drink alcohol, or use illegal drugs. Some items may interact with your medicine. What should I  watch for while using this medicine? Visit your doctor for checks on your progress. This drug may make you feel generally unwell. This is not uncommon, as chemotherapy can affect healthy cells as well as cancer cells. Report any side effects. Continue your course of treatment even though you feel ill unless your doctor tells you to stop. In some cases, you may be given additional medicines to help with side effects. Follow all directions for their use. Call your doctor or health care professional for advice if you get a fever, chills or sore throat, or other symptoms of a cold or flu. Do not treat yourself. This drug decreases your body's ability to fight infections. Try to avoid being around people who are sick. This medicine may increase your risk to bruise or bleed. Call your doctor or health care professional if you notice any unusual bleeding. Be careful brushing and flossing your teeth or using a toothpick because you may get an infection or bleed more easily. If you have any dental work done, tell your dentist you are receiving this medicine. Avoid taking products that contain aspirin, acetaminophen, ibuprofen, naproxen, or ketoprofen unless instructed by your doctor. These medicines may hide a fever. Do not become pregnant while taking this medicine. Women should inform their doctor if they wish to become pregnant or think they might be pregnant. There is a potential for serious side effects to an unborn child. Talk to your health care professional or pharmacist for more information. Do not breast-feed an  infant while taking this medicine. Men should inform their doctor if they wish to father a child. This medicine may lower sperm counts. Do not treat diarrhea with over the counter products. Contact your doctor if you have diarrhea that lasts more than 2 days or if it is severe and watery. This medicine can make you more sensitive to the sun. Keep out of the sun. If you cannot avoid being in the  sun, wear protective clothing and use sunscreen. Do not use sun lamps or tanning beds/booths. What side effects may I notice from receiving this medicine? Side effects that you should report to your doctor or health care professional as soon as possible: -allergic reactions like skin rash, itching or hives, swelling of the face, lips, or tongue -low blood counts - this medicine may decrease the number of white blood cells, red blood cells and platelets. You may be at increased risk for infections and bleeding. -signs of infection - fever or chills, cough, sore throat, pain or difficulty passing urine -signs of decreased platelets or bleeding - bruising, pinpoint red spots on the skin, black, tarry stools, blood in the urine -signs of decreased red blood cells - unusually weak or tired, fainting spells, lightheadedness -breathing problems -changes in vision -chest pain -mouth sores -nausea and vomiting -pain, swelling, redness at site where injected -pain, tingling, numbness in the hands or feet -redness, swelling, or sores on hands or feet -stomach pain -unusual bleeding Side effects that usually do not require medical attention (report to your doctor or health care professional if they continue or are bothersome): -changes in finger or toe nails -diarrhea -dry or itchy skin -hair loss -headache -loss of appetite -sensitivity of eyes to the light -stomach upset -unusually teary eyes This list may not describe all possible side effects. Call your doctor for medical advice about side effects. You may report side effects to FDA at 1-800-FDA-1088. Where should I keep my medicine? This drug is given in a hospital or clinic and will not be stored at home. NOTE: This sheet is a summary. It may not cover all possible information. If you have questions about this medicine, talk to your doctor, pharmacist, or health care provider.  2015, Elsevier/Gold Standard. (2007-12-28  13:53:16)  Leucovorin injection What is this medicine? LEUCOVORIN (loo koe VOR in) is used to prevent or treat the harmful effects of some medicines. This medicine is used to treat anemia caused by a low amount of folic acid in the body. It is also used with 5-fluorouracil (5-FU) to treat colon cancer. This medicine may be used for other purposes; ask your health care provider or pharmacist if you have questions. What should I tell my health care provider before I take this medicine? They need to know if you have any of these conditions: -anemia from low levels of vitamin B-12 in the blood -an unusual or allergic reaction to leucovorin, folic acid, other medicines, foods, dyes, or preservatives -pregnant or trying to get pregnant -breast-feeding How should I use this medicine? This medicine is for injection into a muscle or into a vein. It is given by a health care professional in a hospital or clinic setting. Talk to your pediatrician regarding the use of this medicine in children. Special care may be needed. Overdosage: If you think you have taken too much of this medicine contact a poison control center or emergency room at once. NOTE: This medicine is only for you. Do not share this medicine with others. What  if I miss a dose? This does not apply. What may interact with this medicine? -capecitabine -fluorouracil -phenobarbital -phenytoin -primidone -trimethoprim-sulfamethoxazole This list may not describe all possible interactions. Give your health care provider a list of all the medicines, herbs, non-prescription drugs, or dietary supplements you use. Also tell them if you smoke, drink alcohol, or use illegal drugs. Some items may interact with your medicine. What should I watch for while using this medicine? Your condition will be monitored carefully while you are receiving this medicine. This medicine may increase the side effects of 5-fluorouracil, 5-FU. Tell your doctor or  health care professional if you have diarrhea or mouth sores that do not get better or that get worse. What side effects may I notice from receiving this medicine? Side effects that you should report to your doctor or health care professional as soon as possible: -allergic reactions like skin rash, itching or hives, swelling of the face, lips, or tongue -breathing problems -fever, infection -mouth sores -unusual bleeding or bruising -unusually weak or tired Side effects that usually do not require medical attention (report to your doctor or health care professional if they continue or are bothersome): -constipation or diarrhea -loss of appetite -nausea, vomiting This list may not describe all possible side effects. Call your doctor for medical advice about side effects. You may report side effects to FDA at 1-800-FDA-1088. Where should I keep my medicine? This drug is given in a hospital or clinic and will not be stored at home. NOTE: This sheet is a summary. It may not cover all possible information. If you have questions about this medicine, talk to your doctor, pharmacist, or health care provider.  2015, Elsevier/Gold Standard. (2008-02-28 16:50:29)

## 2014-11-03 ENCOUNTER — Ambulatory Visit (HOSPITAL_BASED_OUTPATIENT_CLINIC_OR_DEPARTMENT_OTHER): Payer: BLUE CROSS/BLUE SHIELD

## 2014-11-03 DIAGNOSIS — C155 Malignant neoplasm of lower third of esophagus: Secondary | ICD-10-CM

## 2014-11-03 DIAGNOSIS — Z452 Encounter for adjustment and management of vascular access device: Secondary | ICD-10-CM

## 2014-11-03 DIAGNOSIS — C160 Malignant neoplasm of cardia: Secondary | ICD-10-CM

## 2014-11-03 MED ORDER — SODIUM CHLORIDE 0.9 % IJ SOLN
10.0000 mL | INTRAMUSCULAR | Status: DC | PRN
  Administered 2014-11-03: 10 mL
  Filled 2014-11-03: qty 10

## 2014-11-03 MED ORDER — HEPARIN SOD (PORK) LOCK FLUSH 100 UNIT/ML IV SOLN
500.0000 [IU] | Freq: Once | INTRAVENOUS | Status: AC | PRN
  Administered 2014-11-03: 500 [IU]
  Filled 2014-11-03: qty 5

## 2014-11-05 ENCOUNTER — Telehealth: Payer: Self-pay | Admitting: *Deleted

## 2014-11-05 NOTE — Telephone Encounter (Signed)
-----   Message from Alla Feeling, RN sent at 11/01/2014 12:59 PM EST ----- Regarding: First time chemo First time folfox.  Dr Benay Spice.  [phone]

## 2014-11-05 NOTE — Telephone Encounter (Signed)
Pt states that he is "doing fairly well"  Denies any c/o other than "just feel tired"  Eating/drinking well; denies fever/N/V/Diarrhea/mouth sores.  Pt encouraged to continue drinking lots of water and to call if any problems.  Pt verbalized understanding and confirmed appt for 11/15/14.

## 2014-11-11 ENCOUNTER — Other Ambulatory Visit: Payer: Self-pay | Admitting: Oncology

## 2014-11-12 ENCOUNTER — Other Ambulatory Visit: Payer: BLUE CROSS/BLUE SHIELD

## 2014-11-12 ENCOUNTER — Ambulatory Visit: Payer: BLUE CROSS/BLUE SHIELD | Admitting: Nurse Practitioner

## 2014-11-12 ENCOUNTER — Ambulatory Visit: Payer: BLUE CROSS/BLUE SHIELD

## 2014-11-15 ENCOUNTER — Telehealth: Payer: Self-pay | Admitting: Oncology

## 2014-11-15 ENCOUNTER — Ambulatory Visit (HOSPITAL_BASED_OUTPATIENT_CLINIC_OR_DEPARTMENT_OTHER): Payer: BLUE CROSS/BLUE SHIELD

## 2014-11-15 ENCOUNTER — Other Ambulatory Visit (HOSPITAL_BASED_OUTPATIENT_CLINIC_OR_DEPARTMENT_OTHER): Payer: BLUE CROSS/BLUE SHIELD

## 2014-11-15 ENCOUNTER — Ambulatory Visit (HOSPITAL_BASED_OUTPATIENT_CLINIC_OR_DEPARTMENT_OTHER): Payer: BLUE CROSS/BLUE SHIELD | Admitting: Nurse Practitioner

## 2014-11-15 VITALS — BP 123/80 | HR 73 | Temp 98.5°F | Resp 18 | Ht 70.0 in | Wt 202.1 lb

## 2014-11-15 DIAGNOSIS — C159 Malignant neoplasm of esophagus, unspecified: Secondary | ICD-10-CM

## 2014-11-15 DIAGNOSIS — C155 Malignant neoplasm of lower third of esophagus: Secondary | ICD-10-CM

## 2014-11-15 DIAGNOSIS — C61 Malignant neoplasm of prostate: Secondary | ICD-10-CM

## 2014-11-15 DIAGNOSIS — C160 Malignant neoplasm of cardia: Secondary | ICD-10-CM

## 2014-11-15 DIAGNOSIS — R918 Other nonspecific abnormal finding of lung field: Secondary | ICD-10-CM

## 2014-11-15 DIAGNOSIS — Z5111 Encounter for antineoplastic chemotherapy: Secondary | ICD-10-CM

## 2014-11-15 LAB — CBC WITH DIFFERENTIAL/PLATELET
BASO%: 0.3 % (ref 0.0–2.0)
Basophils Absolute: 0 10*3/uL (ref 0.0–0.1)
EOS%: 0.9 % (ref 0.0–7.0)
Eosinophils Absolute: 0.1 10*3/uL (ref 0.0–0.5)
HCT: 38.2 % — ABNORMAL LOW (ref 38.4–49.9)
HGB: 12.3 g/dL — ABNORMAL LOW (ref 13.0–17.1)
LYMPH#: 0.4 10*3/uL — AB (ref 0.9–3.3)
LYMPH%: 6.4 % — AB (ref 14.0–49.0)
MCH: 29.9 pg (ref 27.2–33.4)
MCHC: 32.3 g/dL (ref 32.0–36.0)
MCV: 92.6 fL (ref 79.3–98.0)
MONO#: 0.6 10*3/uL (ref 0.1–0.9)
MONO%: 9.1 % (ref 0.0–14.0)
NEUT#: 5.3 10*3/uL (ref 1.5–6.5)
NEUT%: 83.3 % — ABNORMAL HIGH (ref 39.0–75.0)
Platelets: 155 10*3/uL (ref 140–400)
RBC: 4.12 10*6/uL — AB (ref 4.20–5.82)
RDW: 12.8 % (ref 11.0–14.6)
WBC: 6.4 10*3/uL (ref 4.0–10.3)

## 2014-11-15 LAB — COMPREHENSIVE METABOLIC PANEL (CC13)
ALBUMIN: 3.6 g/dL (ref 3.5–5.0)
ALK PHOS: 81 U/L (ref 40–150)
ALT: 8 U/L (ref 0–55)
AST: 14 U/L (ref 5–34)
Anion Gap: 10 mEq/L (ref 3–11)
BILIRUBIN TOTAL: 0.34 mg/dL (ref 0.20–1.20)
BUN: 16.2 mg/dL (ref 7.0–26.0)
CALCIUM: 9.3 mg/dL (ref 8.4–10.4)
CHLORIDE: 105 meq/L (ref 98–109)
CO2: 26 meq/L (ref 22–29)
CREATININE: 0.8 mg/dL (ref 0.7–1.3)
EGFR: >90 mL/min/{1.73_m2} (ref >90)
GLUCOSE: 116 mg/dL (ref 70–140)
Potassium: 4.2 mEq/L (ref 3.5–5.1)
Sodium: 141 mEq/L (ref 136–145)
Total Protein: 6.6 g/dL (ref 6.4–8.3)

## 2014-11-15 MED ORDER — LEUCOVORIN CALCIUM INJECTION 350 MG
400.0000 mg/m2 | Freq: Once | INTRAMUSCULAR | Status: AC
  Administered 2014-11-15: 860 mg via INTRAVENOUS
  Filled 2014-11-15: qty 43

## 2014-11-15 MED ORDER — FLUOROURACIL CHEMO INJECTION 2.5 GM/50ML
400.0000 mg/m2 | Freq: Once | INTRAVENOUS | Status: AC
  Administered 2014-11-15: 850 mg via INTRAVENOUS
  Filled 2014-11-15: qty 17

## 2014-11-15 MED ORDER — DEXTROSE 5 % IV SOLN
Freq: Once | INTRAVENOUS | Status: AC
  Administered 2014-11-15: 11:00:00 via INTRAVENOUS

## 2014-11-15 MED ORDER — SODIUM CHLORIDE 0.9 % IV SOLN
2325.0000 mg/m2 | INTRAVENOUS | Status: DC
  Administered 2014-11-15: 5000 mg via INTRAVENOUS
  Filled 2014-11-15: qty 100

## 2014-11-15 MED ORDER — DEXTROSE 5 % IV SOLN
85.0000 mg/m2 | Freq: Once | INTRAVENOUS | Status: AC
  Administered 2014-11-15: 185 mg via INTRAVENOUS
  Filled 2014-11-15: qty 37

## 2014-11-15 MED ORDER — SODIUM CHLORIDE 0.9 % IV SOLN
Freq: Once | INTRAVENOUS | Status: AC
  Administered 2014-11-15: 11:00:00 via INTRAVENOUS
  Filled 2014-11-15: qty 4

## 2014-11-15 NOTE — Progress Notes (Signed)
  [facility] OFFICE PROGRESS NOTE   Diagnosis:  Esophagus cancer  INTERVAL HISTORY:   Joel Miller returns as scheduled. He completed cycle 1 FOLFOX to 25 2016. He denies nausea/vomiting. No mouth sores. No significant diarrhea. He noted fatigue for a few days after the chemotherapy. He did not experience cold sensitivity.  Objective:  Vital signs in last 24 hours:  Blood pressure 123/80, pulse 73, temperature 98.5 F (36.9 C), temperature source Oral, resp. rate 18, height $RemoveBe'5\' 10"'JoZqJWkRT$  (1.778 m), weight 202 lb 1.6 oz (91.672 kg), SpO2 100 %.    HEENT: No thrush or ulcers. Resp: Lungs clear bilaterally. Cardio: Regular rate and rhythm. GI: Abdomen soft and nontender. No hepatomegaly. Vascular: No leg edema.  Port-A-Cath without erythema.    Lab Results:  Lab Results  Component Value Date   WBC 6.4 11/15/2014   HGB 12.3* 11/15/2014   HCT 38.2* 11/15/2014   MCV 92.6 11/15/2014   PLT 155 11/15/2014   NEUTROABS 5.3 11/15/2014    Imaging:  No results found.  Medications: I have reviewed the patient's current medications.  Assessment/Plan: 1. Adenocarcinoma the esophagus/GE junction-status post an endoscopy 05/18/2014 confirming a gastric cardia/lower esophageal mass with tumor extending proximally in the esophagus to 25 cm from the incisors  HER-2/neu amplified   Staging CT scan 05/22/2014 with indeterminate bilateral pulmonary nodules; and prominent paraesophageal, porta hepatis, and para-aortic lymph nodes   Staging PET scan 05/29/2014 confirmed hypermetabolic soft tissue thickening at the distal third of the esophagus extending into the proximal stomach. 2 additional smaller areas of hypermetabolism or noted in the more proximal esophagus with a mildly enlarged hypermetabolic gastrohepatic node.   Initiation of radiation and concurrent Taxol/carboplatin 06/11/2014; Taxol/carboplatin completed 07/09/2014; radiation completed  07/18/2014.  Esophagectomy/jejunostomy feeding tube placement 09/05/2014 with the pathology revealing aypT3ypN2 tumor, HER-2/neu amplified, positive distal resection margin   Cycle 1 FOLFOX to 25 2016 2. History of anorexia/weight loss 3. History of solid dysphagia -improved 4. Diabetes  5. Hypertension  6. Hyperlipidemia  7. History of back pain relieved with naproxen   Disposition: Joel Miller appears stable. He has completed 1 cycle of FOLFOX. Plan to proceed with cycle 2 today. He understands the anticipated treatment course is 8-9 cycles. He will return for a follow-up visit and cycle 3 in 2 weeks. He will contact the office in the interim with any problems.  Plan reviewed with Dr. Benay Spice.    [name] ANP/GNP-BC   11/15/2014  10:31 AM

## 2014-11-15 NOTE — Patient Instructions (Signed)
[facility] Discharge Instructions for Patients Receiving Chemotherapy  Today you received the following chemotherapy agents Oxaliplatin/Leucovorin/5FU  To help prevent nausea and vomiting after your treatment, we encourage you to take your nausea medication as prescribed.   If you develop nausea and vomiting that is not controlled by your nausea medication, call the clinic.   BELOW ARE SYMPTOMS THAT SHOULD BE REPORTED IMMEDIATELY:  *FEVER GREATER THAN 100.5 F  *CHILLS WITH OR WITHOUT FEVER  NAUSEA AND VOMITING THAT IS NOT CONTROLLED WITH YOUR NAUSEA MEDICATION  *UNUSUAL SHORTNESS OF BREATH  *UNUSUAL BRUISING OR BLEEDING  TENDERNESS IN MOUTH AND THROAT WITH OR WITHOUT PRESENCE OF ULCERS  *URINARY PROBLEMS  *BOWEL PROBLEMS  UNUSUAL RASH Items with * indicate a potential emergency and should be followed up as soon as possible.  Feel free to call the clinic you have any questions or concerns. The clinic phone number is [phone].    

## 2014-11-15 NOTE — Telephone Encounter (Signed)
gv and pdrinted appt sched and avs for pt for March and April....sed added tx.

## 2014-11-17 ENCOUNTER — Ambulatory Visit (HOSPITAL_BASED_OUTPATIENT_CLINIC_OR_DEPARTMENT_OTHER): Payer: BLUE CROSS/BLUE SHIELD

## 2014-11-17 DIAGNOSIS — Z452 Encounter for adjustment and management of vascular access device: Secondary | ICD-10-CM

## 2014-11-17 DIAGNOSIS — C160 Malignant neoplasm of cardia: Secondary | ICD-10-CM

## 2014-11-17 DIAGNOSIS — C159 Malignant neoplasm of esophagus, unspecified: Secondary | ICD-10-CM

## 2014-11-17 DIAGNOSIS — C61 Malignant neoplasm of prostate: Secondary | ICD-10-CM

## 2014-11-17 MED ORDER — HEPARIN SOD (PORK) LOCK FLUSH 100 UNIT/ML IV SOLN
500.0000 [IU] | Freq: Once | INTRAVENOUS | Status: AC | PRN
  Administered 2014-11-17: 500 [IU]
  Filled 2014-11-17: qty 5

## 2014-11-17 MED ORDER — SODIUM CHLORIDE 0.9 % IJ SOLN
10.0000 mL | INTRAMUSCULAR | Status: DC | PRN
  Administered 2014-11-17: 10 mL
  Filled 2014-11-17: qty 10

## 2014-11-19 ENCOUNTER — Encounter: Payer: Self-pay | Admitting: Internal Medicine

## 2014-11-25 ENCOUNTER — Other Ambulatory Visit: Payer: Self-pay | Admitting: Oncology

## 2014-11-26 ENCOUNTER — Ambulatory Visit: Payer: BLUE CROSS/BLUE SHIELD

## 2014-11-26 ENCOUNTER — Other Ambulatory Visit: Payer: BLUE CROSS/BLUE SHIELD

## 2014-11-29 ENCOUNTER — Ambulatory Visit (HOSPITAL_BASED_OUTPATIENT_CLINIC_OR_DEPARTMENT_OTHER): Payer: BLUE CROSS/BLUE SHIELD

## 2014-11-29 ENCOUNTER — Telehealth: Payer: Self-pay | Admitting: Oncology

## 2014-11-29 ENCOUNTER — Ambulatory Visit (HOSPITAL_BASED_OUTPATIENT_CLINIC_OR_DEPARTMENT_OTHER): Payer: BLUE CROSS/BLUE SHIELD | Admitting: Oncology

## 2014-11-29 ENCOUNTER — Ambulatory Visit: Payer: BLUE CROSS/BLUE SHIELD

## 2014-11-29 ENCOUNTER — Other Ambulatory Visit (HOSPITAL_BASED_OUTPATIENT_CLINIC_OR_DEPARTMENT_OTHER): Payer: BLUE CROSS/BLUE SHIELD

## 2014-11-29 VITALS — BP 125/75 | HR 71 | Temp 97.9°F | Resp 18 | Ht 70.0 in | Wt 199.4 lb

## 2014-11-29 DIAGNOSIS — C155 Malignant neoplasm of lower third of esophagus: Secondary | ICD-10-CM

## 2014-11-29 DIAGNOSIS — Z5111 Encounter for antineoplastic chemotherapy: Secondary | ICD-10-CM

## 2014-11-29 DIAGNOSIS — I10 Essential (primary) hypertension: Secondary | ICD-10-CM | POA: Diagnosis not present

## 2014-11-29 DIAGNOSIS — E785 Hyperlipidemia, unspecified: Secondary | ICD-10-CM

## 2014-11-29 DIAGNOSIS — C160 Malignant neoplasm of cardia: Secondary | ICD-10-CM

## 2014-11-29 DIAGNOSIS — C159 Malignant neoplasm of esophagus, unspecified: Secondary | ICD-10-CM

## 2014-11-29 DIAGNOSIS — C61 Malignant neoplasm of prostate: Secondary | ICD-10-CM

## 2014-11-29 LAB — CBC WITH DIFFERENTIAL/PLATELET
BASO%: 0.3 % (ref 0.0–2.0)
Basophils Absolute: 0 10*3/uL (ref 0.0–0.1)
EOS ABS: 0.1 10*3/uL (ref 0.0–0.5)
EOS%: 0.9 % (ref 0.0–7.0)
HCT: 36.8 % — ABNORMAL LOW (ref 38.4–49.9)
HGB: 12.1 g/dL — ABNORMAL LOW (ref 13.0–17.1)
LYMPH%: 6.2 % — AB (ref 14.0–49.0)
MCH: 29.8 pg (ref 27.2–33.4)
MCHC: 32.8 g/dL (ref 32.0–36.0)
MCV: 90.9 fL (ref 79.3–98.0)
MONO#: 0.8 10*3/uL (ref 0.1–0.9)
MONO%: 14.5 % — AB (ref 0.0–14.0)
NEUT%: 78.1 % — ABNORMAL HIGH (ref 39.0–75.0)
NEUTROS ABS: 4.3 10*3/uL (ref 1.5–6.5)
PLATELETS: 136 10*3/uL — AB (ref 140–400)
RBC: 4.05 10*6/uL — AB (ref 4.20–5.82)
RDW: 12.9 % (ref 11.0–14.6)
WBC: 5.6 10*3/uL (ref 4.0–10.3)
lymph#: 0.3 10*3/uL — ABNORMAL LOW (ref 0.9–3.3)

## 2014-11-29 LAB — COMPREHENSIVE METABOLIC PANEL (CC13)
ALBUMIN: 3.5 g/dL (ref 3.5–5.0)
ALK PHOS: 82 U/L (ref 40–150)
ALT: 10 U/L (ref 0–55)
AST: 15 U/L (ref 5–34)
Anion Gap: 8 mEq/L (ref 3–11)
BUN: 12.8 mg/dL (ref 7.0–26.0)
CO2: 26 mEq/L (ref 22–29)
Calcium: 9 mg/dL (ref 8.4–10.4)
Chloride: 106 mEq/L (ref 98–109)
Creatinine: 0.8 mg/dL (ref 0.7–1.3)
EGFR: >90 mL/min/{1.73_m2} (ref >90)
GLUCOSE: 107 mg/dL (ref 70–140)
Potassium: 4.5 mEq/L (ref 3.5–5.1)
Sodium: 139 mEq/L (ref 136–145)
TOTAL PROTEIN: 6.4 g/dL (ref 6.4–8.3)
Total Bilirubin: 0.4 mg/dL (ref 0.20–1.20)

## 2014-11-29 MED ORDER — LEUCOVORIN CALCIUM INJECTION 350 MG
400.0000 mg/m2 | Freq: Once | INTRAMUSCULAR | Status: AC
  Administered 2014-11-29: 860 mg via INTRAVENOUS
  Filled 2014-11-29: qty 43

## 2014-11-29 MED ORDER — SODIUM CHLORIDE 0.9 % IV SOLN
2325.0000 mg/m2 | INTRAVENOUS | Status: DC
  Administered 2014-11-29: 5000 mg via INTRAVENOUS
  Filled 2014-11-29: qty 100

## 2014-11-29 MED ORDER — DEXTROSE 5 % IV SOLN
Freq: Once | INTRAVENOUS | Status: AC
  Administered 2014-11-29: 11:00:00 via INTRAVENOUS

## 2014-11-29 MED ORDER — DEXTROSE 5 % IV SOLN
85.0000 mg/m2 | Freq: Once | INTRAVENOUS | Status: AC
  Administered 2014-11-29: 185 mg via INTRAVENOUS
  Filled 2014-11-29: qty 37

## 2014-11-29 MED ORDER — SODIUM CHLORIDE 0.9 % IV SOLN
Freq: Once | INTRAVENOUS | Status: AC
  Administered 2014-11-29: 12:00:00 via INTRAVENOUS
  Filled 2014-11-29: qty 4

## 2014-11-29 MED ORDER — FLUOROURACIL CHEMO INJECTION 2.5 GM/50ML
400.0000 mg/m2 | Freq: Once | INTRAVENOUS | Status: AC
  Administered 2014-11-29: 850 mg via INTRAVENOUS
  Filled 2014-11-29: qty 17

## 2014-11-29 NOTE — Progress Notes (Signed)
  [facility] OFFICE PROGRESS NOTE   Diagnosis: Gastroesophageal carcinoma  INTERVAL HISTORY:   He returns as scheduled. He is second cycle of FOLFOX 11/15/2014. No nausea, mouth sores, or diarrhea following chemotherapy. No neuropathy symptoms. He reports his entire body feels mildly "cold ". He continues to have early satiety. He feels that his weight has stabilized. He is working.  Objective:  Vital signs in last 24 hours:  Blood pressure 125/75, pulse 71, temperature 97.9 F (36.6 C), temperature source Oral, resp. rate 18, height _0  (1.778 m), weight 199 lb 6.4 oz (90.447 kg), SpO2 100 %.    HEENT: No thrush or ulcers Resp: Lungs clear bilaterally Cardio: Regular rate and rhythm GI: No hepatomegaly, nontender Vascular: No leg edema Neuro: The vibratory sense is intact at the fingertips bilaterally    Lab Results:  Lab Results  Component Value Date   WBC 5.6 11/29/2014   HGB 12.1* 11/29/2014   HCT 36.8* 11/29/2014   MCV 90.9 11/29/2014   PLT 136* 11/29/2014   NEUTROABS 4.3 11/29/2014    Medications: I have reviewed the patient's current medications.  Assessment/Plan: 1. Adenocarcinoma the esophagus/GE junction-status post an endoscopy 05/18/2014 confirming a gastric cardia/lower esophageal mass with tumor extending proximally in the esophagus to 25 cm from the incisors  HER-2/neu amplified   Staging CT scan 05/22/2014 with indeterminate bilateral pulmonary nodules; and prominent paraesophageal, porta hepatis, and para-aortic lymph nodes   Staging PET scan 05/29/2014 confirmed hypermetabolic soft tissue thickening at the distal third of the esophagus extending into the proximal stomach. 2 additional smaller areas of hypermetabolism or noted in the more proximal esophagus with a mildly enlarged hypermetabolic gastrohepatic node.   Initiation of radiation and concurrent Taxol/carboplatin 06/11/2014; Taxol/carboplatin completed 07/09/2014;  radiation completed 07/18/2014.  Esophagectomy/jejunostomy feeding tube placement 09/05/2014 with the pathology revealing aypT3ypN2 tumor, HER-2/neu amplified, positive distal resection margin   Cycle 1 FOLFOX 11/01/2014  Cycle 2 FOLFOX 11/15/2014  Cycle 3 FOLFOX 11/29/2014 2. History of anorexia/weight loss 3. History of solid dysphagia -improved 4. Diabetes  5. Hypertension  6. Hyperlipidemia  7. History of back pain relieved with naproxen    Disposition:  Joel Miller has completed 2 cycles of adjuvant FOLFOX. He is tolerating the chemotherapy well. The plan to proceed with cycle 3 today. He will return for an office visit and chemotherapy in 2 weeks.  Betsy Coder, MD  11/29/2014  8:28 PM

## 2014-11-29 NOTE — Patient Instructions (Signed)
[facility] Discharge Instructions for Patients Receiving Chemotherapy  Today you received the following chemotherapy agents Oxaliplatin/Leucovorin/5 FU  To help prevent nausea and vomiting after your treatment, we encourage you to take your nausea medication as prescribed.   If you develop nausea and vomiting that is not controlled by your nausea medication, call the clinic.   BELOW ARE SYMPTOMS THAT SHOULD BE REPORTED IMMEDIATELY:  *FEVER GREATER THAN 100.5 F  *CHILLS WITH OR WITHOUT FEVER  NAUSEA AND VOMITING THAT IS NOT CONTROLLED WITH YOUR NAUSEA MEDICATION  *UNUSUAL SHORTNESS OF BREATH  *UNUSUAL BRUISING OR BLEEDING  TENDERNESS IN MOUTH AND THROAT WITH OR WITHOUT PRESENCE OF ULCERS  *URINARY PROBLEMS  *BOWEL PROBLEMS  UNUSUAL RASH Items with * indicate a potential emergency and should be followed up as soon as possible.  Feel free to call the clinic you have any questions or concerns. The clinic phone number is [phone].  Please show the CHEMO ALERT CARD at check-in to the Emergency Department and triage nurse.   

## 2014-11-29 NOTE — Telephone Encounter (Signed)
gave and printed appt sched and avs for pt for April and March....sed added tx

## 2014-12-01 ENCOUNTER — Ambulatory Visit (HOSPITAL_BASED_OUTPATIENT_CLINIC_OR_DEPARTMENT_OTHER): Payer: BLUE CROSS/BLUE SHIELD

## 2014-12-01 DIAGNOSIS — Z452 Encounter for adjustment and management of vascular access device: Secondary | ICD-10-CM | POA: Diagnosis not present

## 2014-12-01 DIAGNOSIS — C159 Malignant neoplasm of esophagus, unspecified: Secondary | ICD-10-CM

## 2014-12-01 DIAGNOSIS — C61 Malignant neoplasm of prostate: Secondary | ICD-10-CM

## 2014-12-01 MED ORDER — SODIUM CHLORIDE 0.9 % IJ SOLN
10.0000 mL | INTRAMUSCULAR | Status: DC | PRN
  Administered 2014-12-01: 10 mL
  Filled 2014-12-01: qty 10

## 2014-12-01 MED ORDER — HEPARIN SOD (PORK) LOCK FLUSH 100 UNIT/ML IV SOLN
500.0000 [IU] | Freq: Once | INTRAVENOUS | Status: AC | PRN
  Administered 2014-12-01: 500 [IU]
  Filled 2014-12-01: qty 5

## 2014-12-09 ENCOUNTER — Other Ambulatory Visit: Payer: Self-pay | Admitting: Oncology

## 2014-12-10 ENCOUNTER — Telehealth: Payer: Self-pay | Admitting: *Deleted

## 2014-12-10 ENCOUNTER — Telehealth: Payer: Self-pay | Admitting: Oncology

## 2014-12-10 NOTE — Telephone Encounter (Signed)
Called pt with appointment change for this week. Dr. Benay Spice will see pt on 4/5 at 1145. Will have lab/ infusion on 4/7. Pt understands and agrees.

## 2014-12-10 NOTE — Telephone Encounter (Signed)
Moved labs closer to chemo visit and moved MD visit to 04/05 per 04/04 POF..... Pt is aware.Marland KitchenKJ

## 2014-12-10 NOTE — Telephone Encounter (Signed)
Closed encounter °

## 2014-12-11 ENCOUNTER — Telehealth: Payer: Self-pay | Admitting: Oncology

## 2014-12-11 ENCOUNTER — Ambulatory Visit (HOSPITAL_BASED_OUTPATIENT_CLINIC_OR_DEPARTMENT_OTHER): Payer: BLUE CROSS/BLUE SHIELD | Admitting: Oncology

## 2014-12-11 VITALS — BP 139/78 | HR 66 | Temp 97.8°F | Resp 18 | Ht 70.0 in | Wt 199.1 lb

## 2014-12-11 DIAGNOSIS — E119 Type 2 diabetes mellitus without complications: Secondary | ICD-10-CM | POA: Diagnosis not present

## 2014-12-11 DIAGNOSIS — C159 Malignant neoplasm of esophagus, unspecified: Secondary | ICD-10-CM

## 2014-12-11 DIAGNOSIS — C160 Malignant neoplasm of cardia: Secondary | ICD-10-CM

## 2014-12-11 DIAGNOSIS — C155 Malignant neoplasm of lower third of esophagus: Secondary | ICD-10-CM

## 2014-12-11 DIAGNOSIS — R918 Other nonspecific abnormal finding of lung field: Secondary | ICD-10-CM | POA: Diagnosis not present

## 2014-12-11 DIAGNOSIS — I10 Essential (primary) hypertension: Secondary | ICD-10-CM

## 2014-12-11 NOTE — Progress Notes (Signed)
  [facility] OFFICE PROGRESS NOTE   Diagnosis: Esophagus cancer  INTERVAL HISTORY:   He completed a third cycle of FOLFOX 11/29/2014. No nausea/vomiting, mouth sores, or diarrhea following chemotherapy. He noted increased malaise and cold sensitivity following this cycle. Mild peripheral numbness that does not interfere with activity. He is working.  Objective:  Vital signs in last 24 hours:  Blood pressure 139/78, pulse 66, temperature 97.8 F (36.6 C), temperature source Oral, resp. rate 18, height _0  (1.778 m), weight 199 lb 1.6 oz (90.311 kg), SpO2 100 %.    HEENT: No thrush or ulcers Resp: Lungs clear bilaterally Cardio: Regular rate and rhythm GI: No hepatomegaly, nontender Vascular: No leg edema Neuro: The vibratory sense is intact at the fingertips bilaterally    Portacath/PICC-without erythema  Lab Results:  Lab Results  Component Value Date   WBC 5.6 11/29/2014   HGB 12.1* 11/29/2014   HCT 36.8* 11/29/2014   MCV 90.9 11/29/2014   PLT 136* 11/29/2014   NEUTROABS 4.3 11/29/2014    Medications: I have reviewed the patient's current medications.  Assessment/Plan: 1. Adenocarcinoma the esophagus/GE junction-status post an endoscopy 05/18/2014 confirming a gastric cardia/lower esophageal mass with tumor extending proximally in the esophagus to 25 cm from the incisors  HER-2/neu amplified   Staging CT scan 05/22/2014 with indeterminate bilateral pulmonary nodules; and prominent paraesophageal, porta hepatis, and para-aortic lymph nodes   Staging PET scan 05/29/2014 confirmed hypermetabolic soft tissue thickening at the distal third of the esophagus extending into the proximal stomach. 2 additional smaller areas of hypermetabolism or noted in the more proximal esophagus with a mildly enlarged hypermetabolic gastrohepatic node.   Initiation of radiation and concurrent Taxol/carboplatin 06/11/2014; Taxol/carboplatin completed 07/09/2014;  radiation completed 07/18/2014.  Esophagectomy/jejunostomy feeding tube placement 09/05/2014 with the pathology revealing aypT3ypN2 tumor, HER-2/neu amplified, positive distal resection margin   Cycle 1 FOLFOX 11/01/2014  Cycle 2 FOLFOX 11/15/2014  Cycle 3 FOLFOX 11/29/2014  Cycle 4 FOLFOX 12/13/2014 2. History of anorexia/weight loss 3. History of solid dysphagia -improved 4. Diabetes  5. Hypertension  6. Hyperlipidemia  7. History of back pain relieved with naproxen   Disposition:  Mr. Heckendorn appears stable. He is tolerating the FOLFOX well. The plan is to proceed with cycle 4 FOLFOX on 12/13/2014. He will return for an office visit and chemotherapy on 12/27/2014.  Betsy Coder, MD  12/11/2014  1:29 PM

## 2014-12-11 NOTE — Telephone Encounter (Signed)
pt did not want avs or print out he said he is aware of appts...Marland Kitchenappts added

## 2014-12-13 ENCOUNTER — Ambulatory Visit: Payer: BLUE CROSS/BLUE SHIELD | Admitting: Oncology

## 2014-12-13 ENCOUNTER — Other Ambulatory Visit (HOSPITAL_BASED_OUTPATIENT_CLINIC_OR_DEPARTMENT_OTHER): Payer: BLUE CROSS/BLUE SHIELD

## 2014-12-13 ENCOUNTER — Ambulatory Visit (HOSPITAL_BASED_OUTPATIENT_CLINIC_OR_DEPARTMENT_OTHER): Payer: BLUE CROSS/BLUE SHIELD

## 2014-12-13 DIAGNOSIS — C61 Malignant neoplasm of prostate: Secondary | ICD-10-CM

## 2014-12-13 DIAGNOSIS — C159 Malignant neoplasm of esophagus, unspecified: Secondary | ICD-10-CM

## 2014-12-13 DIAGNOSIS — C160 Malignant neoplasm of cardia: Secondary | ICD-10-CM

## 2014-12-13 DIAGNOSIS — Z5111 Encounter for antineoplastic chemotherapy: Secondary | ICD-10-CM | POA: Diagnosis not present

## 2014-12-13 DIAGNOSIS — C155 Malignant neoplasm of lower third of esophagus: Secondary | ICD-10-CM

## 2014-12-13 LAB — COMPREHENSIVE METABOLIC PANEL (CC13)
ALK PHOS: 92 U/L (ref 40–150)
ALT: 13 U/L (ref 0–55)
AST: 19 U/L (ref 5–34)
Albumin: 3.6 g/dL (ref 3.5–5.0)
Anion Gap: 10 mEq/L (ref 3–11)
BILIRUBIN TOTAL: 0.39 mg/dL (ref 0.20–1.20)
BUN: 16 mg/dL (ref 7.0–26.0)
CO2: 26 mEq/L (ref 22–29)
Calcium: 9.2 mg/dL (ref 8.4–10.4)
Chloride: 104 mEq/L (ref 98–109)
Creatinine: 0.8 mg/dL (ref 0.7–1.3)
EGFR: >90 mL/min/{1.73_m2} (ref >90)
GLUCOSE: 98 mg/dL (ref 70–140)
Potassium: 4.2 mEq/L (ref 3.5–5.1)
SODIUM: 140 meq/L (ref 136–145)
TOTAL PROTEIN: 6.4 g/dL (ref 6.4–8.3)

## 2014-12-13 LAB — CBC WITH DIFFERENTIAL/PLATELET
BASO%: 0.3 % (ref 0.0–2.0)
Basophils Absolute: 0 10*3/uL (ref 0.0–0.1)
EOS%: 1.5 % (ref 0.0–7.0)
Eosinophils Absolute: 0.1 10*3/uL (ref 0.0–0.5)
HCT: 35.1 % — ABNORMAL LOW (ref 38.4–49.9)
HGB: 11.8 g/dL — ABNORMAL LOW (ref 13.0–17.1)
LYMPH%: 12 % — ABNORMAL LOW (ref 14.0–49.0)
MCH: 30.5 pg (ref 27.2–33.4)
MCHC: 33.6 g/dL (ref 32.0–36.0)
MCV: 90.7 fL (ref 79.3–98.0)
MONO#: 0.6 10*3/uL (ref 0.1–0.9)
MONO%: 14.3 % — ABNORMAL HIGH (ref 0.0–14.0)
NEUT%: 71.9 % (ref 39.0–75.0)
NEUTROS ABS: 2.8 10*3/uL (ref 1.5–6.5)
Platelets: 99 10*3/uL — ABNORMAL LOW (ref 140–400)
RBC: 3.87 10*6/uL — AB (ref 4.20–5.82)
RDW: 15.2 % — ABNORMAL HIGH (ref 11.0–14.6)
WBC: 3.9 10*3/uL — AB (ref 4.0–10.3)
lymph#: 0.5 10*3/uL — ABNORMAL LOW (ref 0.9–3.3)

## 2014-12-13 MED ORDER — SODIUM CHLORIDE 0.9 % IV SOLN
2320.0000 mg/m2 | INTRAVENOUS | Status: DC
  Administered 2014-12-13: 5000 mg via INTRAVENOUS
  Filled 2014-12-13: qty 100

## 2014-12-13 MED ORDER — DEXTROSE 5 % IV SOLN
Freq: Once | INTRAVENOUS | Status: AC
  Administered 2014-12-13: 14:00:00 via INTRAVENOUS

## 2014-12-13 MED ORDER — OXALIPLATIN CHEMO INJECTION 100 MG/20ML
85.0000 mg/m2 | Freq: Once | INTRAVENOUS | Status: AC
  Administered 2014-12-13: 185 mg via INTRAVENOUS
  Filled 2014-12-13: qty 37

## 2014-12-13 MED ORDER — FLUOROURACIL CHEMO INJECTION 2.5 GM/50ML
400.0000 mg/m2 | Freq: Once | INTRAVENOUS | Status: AC
  Administered 2014-12-13: 850 mg via INTRAVENOUS
  Filled 2014-12-13: qty 17

## 2014-12-13 MED ORDER — LEUCOVORIN CALCIUM INJECTION 350 MG
400.0000 mg/m2 | Freq: Once | INTRAVENOUS | Status: AC
  Administered 2014-12-13: 860 mg via INTRAVENOUS
  Filled 2014-12-13: qty 43

## 2014-12-13 MED ORDER — SODIUM CHLORIDE 0.9 % IV SOLN
Freq: Once | INTRAVENOUS | Status: AC
  Administered 2014-12-13: 14:00:00 via INTRAVENOUS
  Filled 2014-12-13: qty 4

## 2014-12-13 NOTE — Patient Instructions (Signed)
[facility] Discharge Instructions for Patients Receiving Chemotherapy  Today you received the following chemotherapy agents FOLFOX.   To help prevent nausea and vomiting after your treatment, we encourage you to take your nausea medication as directed.    If you develop nausea and vomiting that is not controlled by your nausea medication, call the clinic.   BELOW ARE SYMPTOMS THAT SHOULD BE REPORTED IMMEDIATELY:  *FEVER GREATER THAN 100.5 F  *CHILLS WITH OR WITHOUT FEVER  NAUSEA AND VOMITING THAT IS NOT CONTROLLED WITH YOUR NAUSEA MEDICATION  *UNUSUAL SHORTNESS OF BREATH  *UNUSUAL BRUISING OR BLEEDING  TENDERNESS IN MOUTH AND THROAT WITH OR WITHOUT PRESENCE OF ULCERS  *URINARY PROBLEMS  *BOWEL PROBLEMS  UNUSUAL RASH Items with * indicate a potential emergency and should be followed up as soon as possible.  Feel free to call the clinic you have any questions or concerns. The clinic phone number is [phone].  Please show the CHEMO ALERT CARD at check-in to the Emergency Department and triage nurse.   

## 2014-12-15 ENCOUNTER — Ambulatory Visit (HOSPITAL_BASED_OUTPATIENT_CLINIC_OR_DEPARTMENT_OTHER): Payer: BLUE CROSS/BLUE SHIELD

## 2014-12-15 DIAGNOSIS — C155 Malignant neoplasm of lower third of esophagus: Secondary | ICD-10-CM | POA: Diagnosis not present

## 2014-12-15 DIAGNOSIS — C159 Malignant neoplasm of esophagus, unspecified: Secondary | ICD-10-CM

## 2014-12-15 DIAGNOSIS — C61 Malignant neoplasm of prostate: Secondary | ICD-10-CM

## 2014-12-15 DIAGNOSIS — Z452 Encounter for adjustment and management of vascular access device: Secondary | ICD-10-CM

## 2014-12-15 MED ORDER — SODIUM CHLORIDE 0.9 % IJ SOLN
10.0000 mL | INTRAMUSCULAR | Status: DC | PRN
  Administered 2014-12-15: 10 mL
  Filled 2014-12-15: qty 10

## 2014-12-15 MED ORDER — HEPARIN SOD (PORK) LOCK FLUSH 100 UNIT/ML IV SOLN
500.0000 [IU] | Freq: Once | INTRAVENOUS | Status: AC | PRN
  Administered 2014-12-15: 500 [IU]
  Filled 2014-12-15: qty 5

## 2014-12-21 ENCOUNTER — Encounter: Payer: Self-pay | Admitting: Oncology

## 2014-12-23 ENCOUNTER — Other Ambulatory Visit: Payer: Self-pay | Admitting: Oncology

## 2014-12-27 ENCOUNTER — Ambulatory Visit (HOSPITAL_COMMUNITY): Payer: BLUE CROSS/BLUE SHIELD

## 2014-12-27 ENCOUNTER — Other Ambulatory Visit (HOSPITAL_BASED_OUTPATIENT_CLINIC_OR_DEPARTMENT_OTHER): Payer: BLUE CROSS/BLUE SHIELD

## 2014-12-27 ENCOUNTER — Ambulatory Visit (HOSPITAL_BASED_OUTPATIENT_CLINIC_OR_DEPARTMENT_OTHER): Payer: BLUE CROSS/BLUE SHIELD | Admitting: Nurse Practitioner

## 2014-12-27 ENCOUNTER — Ambulatory Visit (HOSPITAL_BASED_OUTPATIENT_CLINIC_OR_DEPARTMENT_OTHER): Payer: BLUE CROSS/BLUE SHIELD

## 2014-12-27 VITALS — BP 130/77 | HR 68 | Temp 98.2°F | Resp 18 | Ht 70.0 in | Wt 198.1 lb

## 2014-12-27 DIAGNOSIS — C160 Malignant neoplasm of cardia: Secondary | ICD-10-CM

## 2014-12-27 DIAGNOSIS — C155 Malignant neoplasm of lower third of esophagus: Secondary | ICD-10-CM | POA: Diagnosis not present

## 2014-12-27 DIAGNOSIS — C61 Malignant neoplasm of prostate: Secondary | ICD-10-CM

## 2014-12-27 DIAGNOSIS — C159 Malignant neoplasm of esophagus, unspecified: Secondary | ICD-10-CM

## 2014-12-27 DIAGNOSIS — Z5111 Encounter for antineoplastic chemotherapy: Secondary | ICD-10-CM

## 2014-12-27 LAB — CBC WITH DIFFERENTIAL/PLATELET
BASO%: 0.5 % (ref 0.0–2.0)
Basophils Absolute: 0 10*3/uL (ref 0.0–0.1)
EOS%: 0.9 % (ref 0.0–7.0)
Eosinophils Absolute: 0 10*3/uL (ref 0.0–0.5)
HEMATOCRIT: 35.3 % — AB (ref 38.4–49.9)
HGB: 11.7 g/dL — ABNORMAL LOW (ref 13.0–17.1)
LYMPH%: 8 % — ABNORMAL LOW (ref 14.0–49.0)
MCH: 29.6 pg (ref 27.2–33.4)
MCHC: 33 g/dL (ref 32.0–36.0)
MCV: 89.7 fL (ref 79.3–98.0)
MONO#: 0.7 10*3/uL (ref 0.1–0.9)
MONO%: 15.1 % — ABNORMAL HIGH (ref 0.0–14.0)
NEUT#: 3.4 10*3/uL (ref 1.5–6.5)
NEUT%: 75.5 % — ABNORMAL HIGH (ref 39.0–75.0)
Platelets: 145 10*3/uL (ref 140–400)
RBC: 3.94 10*6/uL — AB (ref 4.20–5.82)
RDW: 16.8 % — AB (ref 11.0–14.6)
WBC: 4.5 10*3/uL (ref 4.0–10.3)
lymph#: 0.4 10*3/uL — ABNORMAL LOW (ref 0.9–3.3)

## 2014-12-27 LAB — COMPREHENSIVE METABOLIC PANEL (CC13)
ALT: 16 U/L (ref 0–55)
AST: 24 U/L (ref 5–34)
Albumin: 3.6 g/dL (ref 3.5–5.0)
Alkaline Phosphatase: 86 U/L (ref 40–150)
Anion Gap: 13 mEq/L — ABNORMAL HIGH (ref 3–11)
BILIRUBIN TOTAL: 0.43 mg/dL (ref 0.20–1.20)
BUN: 14.8 mg/dL (ref 7.0–26.0)
CALCIUM: 9.3 mg/dL (ref 8.4–10.4)
CO2: 22 mEq/L (ref 22–29)
CREATININE: 0.8 mg/dL (ref 0.7–1.3)
Chloride: 104 mEq/L (ref 98–109)
EGFR: >90 mL/min/{1.73_m2} (ref >90)
GLUCOSE: 103 mg/dL (ref 70–140)
POTASSIUM: 4.1 meq/L (ref 3.5–5.1)
Sodium: 139 mEq/L (ref 136–145)
Total Protein: 6.5 g/dL (ref 6.4–8.3)

## 2014-12-27 MED ORDER — LEUCOVORIN CALCIUM INJECTION 350 MG
400.0000 mg/m2 | Freq: Once | INTRAVENOUS | Status: AC
  Administered 2014-12-27: 860 mg via INTRAVENOUS
  Filled 2014-12-27: qty 43

## 2014-12-27 MED ORDER — FLUOROURACIL CHEMO INJECTION 2.5 GM/50ML
400.0000 mg/m2 | Freq: Once | INTRAVENOUS | Status: AC
  Administered 2014-12-27: 850 mg via INTRAVENOUS
  Filled 2014-12-27: qty 17

## 2014-12-27 MED ORDER — DEXTROSE 5 % IV SOLN
Freq: Once | INTRAVENOUS | Status: AC
  Administered 2014-12-27: 14:00:00 via INTRAVENOUS

## 2014-12-27 MED ORDER — SODIUM CHLORIDE 0.9 % IV SOLN
Freq: Once | INTRAVENOUS | Status: AC
  Administered 2014-12-27: 13:00:00 via INTRAVENOUS

## 2014-12-27 MED ORDER — SODIUM CHLORIDE 0.9 % IV SOLN
Freq: Once | INTRAVENOUS | Status: AC
  Administered 2014-12-27: 13:00:00 via INTRAVENOUS
  Filled 2014-12-27: qty 4

## 2014-12-27 MED ORDER — SODIUM CHLORIDE 0.9 % IV SOLN
2320.0000 mg/m2 | INTRAVENOUS | Status: DC
  Administered 2014-12-27: 5000 mg via INTRAVENOUS
  Filled 2014-12-27: qty 100

## 2014-12-27 MED ORDER — DEXTROSE 5 % IV SOLN
85.0000 mg/m2 | Freq: Once | INTRAVENOUS | Status: AC
  Administered 2014-12-27: 185 mg via INTRAVENOUS
  Filled 2014-12-27: qty 37

## 2014-12-27 NOTE — Patient Instructions (Signed)
[facility] Discharge Instructions for Patients Receiving Chemotherapy  Today you received the following chemotherapy agents- Oxaliplatin, Leucovorin, 5FU  To help prevent nausea and vomiting after your treatment, we encourage you to take your nausea medication as prescribed by your physician.   If you develop nausea and vomiting that is not controlled by your nausea medication, call the clinic.   BELOW ARE SYMPTOMS THAT SHOULD BE REPORTED IMMEDIATELY:  *FEVER GREATER THAN 100.5 F  *CHILLS WITH OR WITHOUT FEVER  NAUSEA AND VOMITING THAT IS NOT CONTROLLED WITH YOUR NAUSEA MEDICATION  *UNUSUAL SHORTNESS OF BREATH  *UNUSUAL BRUISING OR BLEEDING  TENDERNESS IN MOUTH AND THROAT WITH OR WITHOUT PRESENCE OF ULCERS  *URINARY PROBLEMS  *BOWEL PROBLEMS  UNUSUAL RASH Items with * indicate a potential emergency and should be followed up as soon as possible.  Feel free to call the clinic you have any questions or concerns. The clinic phone number is [phone].  Please show the CHEMO ALERT CARD at check-in to the Emergency Department and triage nurse.   

## 2014-12-27 NOTE — Progress Notes (Signed)
  [facility] OFFICE PROGRESS NOTE   Diagnosis:  Esophagus cancer  INTERVAL HISTORY:   He returns as scheduled. He completed cycle 4 FOLFOX on 12/13/2014. He had a few episodes of mild nausea. No vomiting. No mouth sores. No diarrhea. He notes tingling in the fingertips. Hands and feet intermittently feel cold. Symptoms do not interfere with activity.  Objective:  Vital signs in last 24 hours:  Blood pressure 130/77, pulse 68, temperature 98.2 F (36.8 C), temperature source Oral, resp. rate 18, height $RemoveBe'5\' 10"'sjjyAwvlI$  (1.778 m), weight 198 lb 1.6 oz (89.858 kg), SpO2 100 %.    HEENT: No thrush or ulcers. Resp: Lungs clear bilaterally. Cardio: Regular rate and rhythm. GI: Abdomen soft and nontender. No hepatomegaly. Vascular: No leg edema. Neuro: Vibratory sense intact over the fingertips per tuning fork exam.  Skin: No rash. Port-A-Cath without erythema.    Lab Results:  Lab Results  Component Value Date   WBC 4.5 12/27/2014   HGB 11.7* 12/27/2014   HCT 35.3* 12/27/2014   MCV 89.7 12/27/2014   PLT 145 12/27/2014   NEUTROABS 3.4 12/27/2014    Imaging:  No results found.  Medications: I have reviewed the patient's current medications.  Assessment/Plan: 1. Adenocarcinoma the esophagus/GE junction-status post an endoscopy 05/18/2014 confirming a gastric cardia/lower esophageal mass with tumor extending proximally in the esophagus to 25 cm from the incisors  HER-2/neu amplified   Staging CT scan 05/22/2014 with indeterminate bilateral pulmonary nodules; and prominent paraesophageal, porta hepatis, and para-aortic lymph nodes   Staging PET scan 05/29/2014 confirmed hypermetabolic soft tissue thickening at the distal third of the esophagus extending into the proximal stomach. 2 additional smaller areas of hypermetabolism or noted in the more proximal esophagus with a mildly enlarged hypermetabolic gastrohepatic node.   Initiation of radiation and concurrent  Taxol/carboplatin 06/11/2014; Taxol/carboplatin completed 07/09/2014; radiation completed 07/18/2014.  Esophagectomy/jejunostomy feeding tube placement 09/05/2014 with the pathology revealing aypT3ypN2 tumor, HER-2/neu amplified, positive distal resection margin   Cycle 1 FOLFOX 11/01/2014  Cycle 2 FOLFOX 11/15/2014  Cycle 3 FOLFOX 11/29/2014  Cycle 4 FOLFOX 12/13/2014  Cycle 5 FOLFOX 12/27/2014 2. History of anorexia/weight loss 3. History of solid dysphagia -improved 4. Diabetes  5. Hypertension  6. Hyperlipidemia  7. History of back pain relieved with naproxen   Disposition: Mr. Angelos appears stable. He has completed 4 cycles of FOLFOX. Plan to proceed with cycle 5 today as scheduled. He will return for a follow-up visit and cycle 6 FOLFOX at a 3 week interval per his request (01/17/2015). He will contact the office in the interim with any problems.    [name] ANP/GNP-BC   12/27/2014  12:17 PM

## 2014-12-28 ENCOUNTER — Ambulatory Visit: Payer: BLUE CROSS/BLUE SHIELD | Admitting: Radiation Oncology

## 2014-12-29 ENCOUNTER — Ambulatory Visit (HOSPITAL_BASED_OUTPATIENT_CLINIC_OR_DEPARTMENT_OTHER): Payer: BLUE CROSS/BLUE SHIELD

## 2014-12-29 VITALS — BP 123/76 | HR 81 | Temp 98.5°F | Resp 17

## 2014-12-29 DIAGNOSIS — C155 Malignant neoplasm of lower third of esophagus: Secondary | ICD-10-CM | POA: Diagnosis not present

## 2014-12-29 DIAGNOSIS — Z452 Encounter for adjustment and management of vascular access device: Secondary | ICD-10-CM | POA: Diagnosis not present

## 2014-12-29 DIAGNOSIS — C159 Malignant neoplasm of esophagus, unspecified: Secondary | ICD-10-CM

## 2014-12-29 DIAGNOSIS — C61 Malignant neoplasm of prostate: Secondary | ICD-10-CM | POA: Diagnosis not present

## 2014-12-29 MED ORDER — SODIUM CHLORIDE 0.9 % IJ SOLN
10.0000 mL | INTRAMUSCULAR | Status: DC | PRN
  Administered 2014-12-29: 10 mL
  Filled 2014-12-29: qty 10

## 2014-12-29 MED ORDER — HEPARIN SOD (PORK) LOCK FLUSH 100 UNIT/ML IV SOLN
500.0000 [IU] | Freq: Once | INTRAVENOUS | Status: AC | PRN
  Administered 2014-12-29: 500 [IU]
  Filled 2014-12-29: qty 5

## 2014-12-29 NOTE — Progress Notes (Signed)
32 mL volume remaining on pump.  6 mL bolused.  26 mL wasted.

## 2015-01-02 ENCOUNTER — Ambulatory Visit: Payer: BLUE CROSS/BLUE SHIELD | Admitting: Radiation Oncology

## 2015-01-03 ENCOUNTER — Other Ambulatory Visit: Payer: Self-pay | Admitting: *Deleted

## 2015-01-03 MED ORDER — OXAZEPAM 15 MG PO CAPS
ORAL_CAPSULE | ORAL | Status: DC
Start: 2015-01-03 — End: 2015-01-03

## 2015-01-03 MED ORDER — OXAZEPAM 15 MG PO CAPS: ORAL_CAPSULE | ORAL | Status: DC

## 2015-01-08 ENCOUNTER — Telehealth: Payer: Self-pay | Admitting: *Deleted

## 2015-01-10 ENCOUNTER — Ambulatory Visit: Payer: BLUE CROSS/BLUE SHIELD | Admitting: Cardiothoracic Surgery

## 2015-01-12 ENCOUNTER — Other Ambulatory Visit: Payer: Self-pay | Admitting: Oncology

## 2015-01-17 ENCOUNTER — Ambulatory Visit (HOSPITAL_BASED_OUTPATIENT_CLINIC_OR_DEPARTMENT_OTHER): Payer: BLUE CROSS/BLUE SHIELD

## 2015-01-17 ENCOUNTER — Ambulatory Visit (HOSPITAL_BASED_OUTPATIENT_CLINIC_OR_DEPARTMENT_OTHER): Payer: BLUE CROSS/BLUE SHIELD | Admitting: Oncology

## 2015-01-17 ENCOUNTER — Other Ambulatory Visit (HOSPITAL_BASED_OUTPATIENT_CLINIC_OR_DEPARTMENT_OTHER): Payer: BLUE CROSS/BLUE SHIELD

## 2015-01-17 ENCOUNTER — Telehealth: Payer: Self-pay | Admitting: Oncology

## 2015-01-17 VITALS — BP 131/74 | HR 72 | Temp 97.7°F | Resp 18 | Ht 70.0 in | Wt 198.3 lb

## 2015-01-17 DIAGNOSIS — E119 Type 2 diabetes mellitus without complications: Secondary | ICD-10-CM

## 2015-01-17 DIAGNOSIS — C155 Malignant neoplasm of lower third of esophagus: Secondary | ICD-10-CM

## 2015-01-17 DIAGNOSIS — Z5111 Encounter for antineoplastic chemotherapy: Secondary | ICD-10-CM | POA: Diagnosis not present

## 2015-01-17 DIAGNOSIS — C160 Malignant neoplasm of cardia: Secondary | ICD-10-CM

## 2015-01-17 DIAGNOSIS — C159 Malignant neoplasm of esophagus, unspecified: Secondary | ICD-10-CM

## 2015-01-17 DIAGNOSIS — C61 Malignant neoplasm of prostate: Secondary | ICD-10-CM

## 2015-01-17 LAB — CBC WITH DIFFERENTIAL/PLATELET
BASO%: 1.1 % (ref 0.0–2.0)
BASOS ABS: 0 10*3/uL (ref 0.0–0.1)
EOS%: 1.1 % (ref 0.0–7.0)
Eosinophils Absolute: 0 10*3/uL (ref 0.0–0.5)
HEMATOCRIT: 35.2 % — AB (ref 38.4–49.9)
HEMOGLOBIN: 11.9 g/dL — AB (ref 13.0–17.1)
LYMPH%: 14.1 % (ref 14.0–49.0)
MCH: 31.8 pg (ref 27.2–33.4)
MCHC: 33.8 g/dL (ref 32.0–36.0)
MCV: 94.1 fL (ref 79.3–98.0)
MONO#: 0.5 10*3/uL (ref 0.1–0.9)
MONO%: 20.1 % — AB (ref 0.0–14.0)
NEUT#: 1.7 10*3/uL (ref 1.5–6.5)
NEUT%: 63.6 % (ref 39.0–75.0)
Platelets: 149 10*3/uL (ref 140–400)
RBC: 3.74 10*6/uL — ABNORMAL LOW (ref 4.20–5.82)
RDW: 20.3 % — AB (ref 11.0–14.6)
WBC: 2.7 10*3/uL — ABNORMAL LOW (ref 4.0–10.3)
lymph#: 0.4 10*3/uL — ABNORMAL LOW (ref 0.9–3.3)

## 2015-01-17 LAB — COMPREHENSIVE METABOLIC PANEL (CC13)
ALBUMIN: 3.4 g/dL — AB (ref 3.5–5.0)
ALK PHOS: 93 U/L (ref 40–150)
ALT: 24 U/L (ref 0–55)
AST: 34 U/L (ref 5–34)
Anion Gap: 6 mEq/L (ref 3–11)
BILIRUBIN TOTAL: 0.43 mg/dL (ref 0.20–1.20)
BUN: 19.6 mg/dL (ref 7.0–26.0)
CO2: 26 mEq/L (ref 22–29)
CREATININE: 0.8 mg/dL (ref 0.7–1.3)
Calcium: 9.1 mg/dL (ref 8.4–10.4)
Chloride: 105 mEq/L (ref 98–109)
EGFR: >90 mL/min/{1.73_m2} (ref >90)
Glucose: 125 mg/dl (ref 70–140)
POTASSIUM: 4.4 meq/L (ref 3.5–5.1)
Sodium: 138 mEq/L (ref 136–145)
Total Protein: 6.3 g/dL — ABNORMAL LOW (ref 6.4–8.3)

## 2015-01-17 MED ORDER — DEXTROSE 5 % IV SOLN
Freq: Once | INTRAVENOUS | Status: AC
  Administered 2015-01-17: 13:00:00 via INTRAVENOUS

## 2015-01-17 MED ORDER — LEUCOVORIN CALCIUM INJECTION 350 MG
400.0000 mg/m2 | Freq: Once | INTRAVENOUS | Status: AC
  Administered 2015-01-17: 860 mg via INTRAVENOUS
  Filled 2015-01-17: qty 43

## 2015-01-17 MED ORDER — SODIUM CHLORIDE 0.9 % IV SOLN
Freq: Once | INTRAVENOUS | Status: AC
  Administered 2015-01-17: 13:00:00 via INTRAVENOUS
  Filled 2015-01-17: qty 4

## 2015-01-17 MED ORDER — OXALIPLATIN CHEMO INJECTION 100 MG/20ML
85.0000 mg/m2 | Freq: Once | INTRAVENOUS | Status: AC
  Administered 2015-01-17: 185 mg via INTRAVENOUS
  Filled 2015-01-17: qty 37

## 2015-01-17 MED ORDER — SODIUM CHLORIDE 0.9 % IV SOLN
2330.0000 mg/m2 | INTRAVENOUS | Status: DC
  Administered 2015-01-17: 5000 mg via INTRAVENOUS
  Filled 2015-01-17: qty 100

## 2015-01-17 MED ORDER — FLUOROURACIL CHEMO INJECTION 2.5 GM/50ML
400.0000 mg/m2 | Freq: Once | INTRAVENOUS | Status: AC
  Administered 2015-01-17: 850 mg via INTRAVENOUS
  Filled 2015-01-17: qty 17

## 2015-01-17 NOTE — Telephone Encounter (Signed)
per pof to sch pt appt-Melissa did override-pt to ahve CT r/s-gave tp copy of sch

## 2015-01-17 NOTE — Progress Notes (Signed)
  Joel Miller   Diagnosis: Esophagus cancer  INTERVAL HISTORY:   Joel Miller returns as scheduled. He completed another cycle of FOLFOX 12/27/2014. He had nausea following chemotherapy. This was relieved with Compazine. No emesis. He coughs after eating fast. He has cold sensitivity. No neuropathy symptoms that interfere with activity. He is working.  Objective:  Vital signs in last 24 hours:  Blood pressure 131/74, pulse 72, temperature 97.7 F (36.5 C), temperature source Oral, resp. rate 18, height $RemoveBe'5\' 10"'uNDxIBZmS$  (1.778 m), weight 198 lb 4.8 oz (89.948 kg), SpO2 100 %.    HEENT: No thrush or ulcers Lymphatics: No cervical or supra-clavicular nodes Resp: Lungs clear bilaterally Cardio: Regular rate and rhythm GI: No hepatomegaly, nontender, no mass Vascular: No leg edema Neuro: The vibratory sense is intact at the fingertips bilaterally    Portacath/PICC-without erythema  Lab Results:  Lab Results  Component Value Date   WBC 2.7* 01/17/2015   HGB 11.9* 01/17/2015   HCT 35.2* 01/17/2015   MCV 94.1 01/17/2015   PLT 149 01/17/2015   NEUTROABS 1.7 01/17/2015     Medications: I have reviewed the patient's current medications.  Assessment/Plan: 1. Adenocarcinoma the esophagus/GE junction-status post an endoscopy 05/18/2014 confirming a gastric cardia/lower esophageal mass with tumor extending proximally in the esophagus to 25 cm from the incisors  HER-2/neu amplified   Staging CT scan 05/22/2014 with indeterminate bilateral pulmonary nodules; and prominent paraesophageal, porta hepatis, and para-aortic lymph nodes   Staging PET scan 05/29/2014 confirmed hypermetabolic soft tissue thickening at the distal third of the esophagus extending into the proximal stomach. 2 additional smaller areas of hypermetabolism or noted in the more proximal esophagus with a mildly enlarged hypermetabolic gastrohepatic node.   Initiation of radiation and  concurrent Taxol/carboplatin 06/11/2014; Taxol/carboplatin completed 07/09/2014; radiation completed 07/18/2014.  Esophagectomy/jejunostomy feeding tube placement 09/05/2014 with the pathology revealing aypT3ypN2 tumor, HER-2/neu amplified, positive distal resection margin   Cycle 1 FOLFOX 11/01/2014  Cycle 2 FOLFOX 11/15/2014  Cycle 3 FOLFOX 11/29/2014  Cycle 4 FOLFOX 12/13/2014  Cycle 5 FOLFOX 12/27/2014  Cycle 6 FOLFOX 01/17/2015 2. History of anorexia/weight loss 3. History of solid dysphagia -improved 4. Diabetes  5. Hypertension  6. Hyperlipidemia  7. History of back pain relieved with naproxen    Disposition:  Joel Miller would like to discontinue adjuvant chemotherapy after cycle 6 FOLFOX today. He has tolerated the chemotherapy well to date. He would like to proceed with the restaging CT evaluation as ordered by Dr. Isidore Moos. He will be scheduled for CT scans on 01/31/2015 and an office visit on 02/01/2015.  We will decide on removing the Port-A-Cath based on the CT findings. He plans to schedule follow-up appointments with Drs. Servando Snare and Isidore Moos.  Betsy Coder, MD  01/17/2015  11:30 AM

## 2015-01-17 NOTE — Progress Notes (Signed)
Per Lavella Lemons; Dr Sherrill's nurse; Faythe Ghee to treat with WBC 2.7.

## 2015-01-17 NOTE — Patient Instructions (Signed)
[facility] Discharge Instructions for Patients Receiving Chemotherapy  Today you received the following chemotherapy agents Oxaliplatin, Leucovorin, Adrucil,   To help prevent nausea and vomiting after your treatment, we encourage you to take your nausea medication as prescribed.    If you develop nausea and vomiting that is not controlled by your nausea medication, call the clinic.   BELOW ARE SYMPTOMS THAT SHOULD BE REPORTED IMMEDIATELY:  *FEVER GREATER THAN 100.5 F  *CHILLS WITH OR WITHOUT FEVER  NAUSEA AND VOMITING THAT IS NOT CONTROLLED WITH YOUR NAUSEA MEDICATION  *UNUSUAL SHORTNESS OF BREATH  *UNUSUAL BRUISING OR BLEEDING  TENDERNESS IN MOUTH AND THROAT WITH OR WITHOUT PRESENCE OF ULCERS  *URINARY PROBLEMS  *BOWEL PROBLEMS  UNUSUAL RASH Items with * indicate a potential emergency and should be followed up as soon as possible.  Feel free to call the clinic you have any questions or concerns. The clinic phone number is [phone].  Please show the [facility] at check-in to the Emergency Department and triage nurse.

## 2015-01-19 ENCOUNTER — Ambulatory Visit (HOSPITAL_BASED_OUTPATIENT_CLINIC_OR_DEPARTMENT_OTHER): Payer: BLUE CROSS/BLUE SHIELD

## 2015-01-19 VITALS — BP 127/66 | HR 87 | Temp 97.5°F | Resp 18

## 2015-01-19 DIAGNOSIS — C160 Malignant neoplasm of cardia: Secondary | ICD-10-CM

## 2015-01-19 DIAGNOSIS — Z452 Encounter for adjustment and management of vascular access device: Secondary | ICD-10-CM | POA: Diagnosis not present

## 2015-01-19 DIAGNOSIS — C155 Malignant neoplasm of lower third of esophagus: Secondary | ICD-10-CM

## 2015-01-19 MED ORDER — SODIUM CHLORIDE 0.9 % IJ SOLN
10.0000 mL | INTRAMUSCULAR | Status: DC | PRN
  Administered 2015-01-19: 10 mL
  Filled 2015-01-19: qty 10

## 2015-01-19 MED ORDER — HEPARIN SOD (PORK) LOCK FLUSH 100 UNIT/ML IV SOLN
500.0000 [IU] | Freq: Once | INTRAVENOUS | Status: AC | PRN
  Administered 2015-01-19: 500 [IU]
  Filled 2015-01-19: qty 5

## 2015-01-27 ENCOUNTER — Other Ambulatory Visit: Payer: Self-pay | Admitting: Oncology

## 2015-01-31 ENCOUNTER — Ambulatory Visit (HOSPITAL_COMMUNITY)
Admission: RE | Admit: 2015-01-31 | Discharge: 2015-01-31 | Disposition: A | Discharge status: Home / Self Care | Payer: BLUE CROSS/BLUE SHIELD | Source: Ambulatory Visit | Attending: Radiation Oncology | Admitting: Radiation Oncology

## 2015-01-31 ENCOUNTER — Encounter (HOSPITAL_COMMUNITY): Payer: Self-pay

## 2015-01-31 DIAGNOSIS — Z923 Personal history of irradiation: Secondary | ICD-10-CM | POA: Diagnosis not present

## 2015-01-31 DIAGNOSIS — Z8501 Personal history of malignant neoplasm of esophagus: Secondary | ICD-10-CM | POA: Diagnosis not present

## 2015-01-31 DIAGNOSIS — Z9221 Personal history of antineoplastic chemotherapy: Secondary | ICD-10-CM | POA: Diagnosis not present

## 2015-01-31 DIAGNOSIS — C155 Malignant neoplasm of lower third of esophagus: Secondary | ICD-10-CM

## 2015-01-31 MED ORDER — IOHEXOL 300 MG/ML  SOLN
50.0000 mL | Freq: Once | INTRAMUSCULAR | Status: AC | PRN
  Administered 2015-01-31: 50 mL via ORAL

## 2015-01-31 MED ORDER — IOHEXOL 300 MG/ML  SOLN
100.0000 mL | Freq: Once | INTRAMUSCULAR | Status: AC | PRN
  Administered 2015-01-31: 100 mL via INTRAVENOUS

## 2015-02-01 ENCOUNTER — Ambulatory Visit (HOSPITAL_BASED_OUTPATIENT_CLINIC_OR_DEPARTMENT_OTHER): Payer: BLUE CROSS/BLUE SHIELD | Admitting: Oncology

## 2015-02-01 VITALS — BP 118/74 | HR 73 | Temp 97.7°F | Resp 16 | Ht 70.0 in | Wt 197.6 lb

## 2015-02-01 DIAGNOSIS — I10 Essential (primary) hypertension: Secondary | ICD-10-CM | POA: Diagnosis not present

## 2015-02-01 DIAGNOSIS — C155 Malignant neoplasm of lower third of esophagus: Secondary | ICD-10-CM | POA: Diagnosis not present

## 2015-02-01 DIAGNOSIS — E119 Type 2 diabetes mellitus without complications: Secondary | ICD-10-CM

## 2015-02-01 DIAGNOSIS — C160 Malignant neoplasm of cardia: Secondary | ICD-10-CM

## 2015-02-01 DIAGNOSIS — C159 Malignant neoplasm of esophagus, unspecified: Secondary | ICD-10-CM

## 2015-02-01 NOTE — Progress Notes (Signed)
[facility] OFFICE PROGRESS NOTE   Diagnosis: Esophagus cancer  INTERVAL HISTORY:   He returns as scheduled. He completed another cycle of chemotherapy 01/17/2015. He reports improvement in peripheral tingling" and sensitivity. He is working and exercising. He reports coughing after eating a large meal in the evening.  Objective:  Vital signs in last 24 hours:  Blood pressure 118/74, pulse 73, temperature 97.7 F (36.5 C), temperature source Oral, resp. rate 16, height 5' 10"  (1.778 m), weight 197 lb 9.6 oz (89.631 kg), SpO2 100 %.    HEENT: No thrush or ulcers, neck without mass Lymphatics: No cervical, supra-clavicular, or axillary nodes Resp: Lungs clear bilaterally Cardio: Regular rate and rhythm GI: No hepatomegaly, nontender, no mass Vascular: No leg edema   Portacath/PICC-without erythema  Lab Results:  Lab Results  Component Value Date   WBC 2.7* 01/17/2015   HGB 11.9* 01/17/2015   HCT 35.2* 01/17/2015   MCV 94.1 01/17/2015   PLT 149 01/17/2015   NEUTROABS 1.7 01/17/2015     Imaging:  Ct Chest W Contrast  02/01/2015   CLINICAL DATA:  Patient with distal esophageal carcinoma diagnosed 05/18/2014. Status post chemotherapy and radiation. Patient status post esophagectomy 09/05/2014.  EXAM: CT CHEST AND ABDOMEN WITHOUT CONTRAST  TECHNIQUE: Multidetector CT imaging of the chest and abdomen was performed following the standard protocol without intravenous contrast.  COMPARISON:  CT chest 08/09/2014 ; PET-CT 05/29/2014  FINDINGS: CT CHEST FINDINGS  Mediastinum/Nodes: Right anterior chest wall Port-A-Cath is present with tip terminating at the superior cavoatrial junction. No enlarged axillary, mediastinal or hilar lymphadenopathy. Normal heart size. No pericardial effusion. Postoperative changes compatible with esophagectomy and gastric pull-through. No mediastinal fluid collections are identified.  Lungs/Pleura: Central airways are patent. Minimal  scarring and or atelectasis medial right lower lobe (image 38; series 4). No large nodular or consolidative pulmonary opacity. Minimal tiny 1 mm nodules within the peripheral lungs bilaterally are unchanged from prior. No pleural effusion or pneumothorax.  Musculoskeletal: Thoracic spine degenerative changes without aggressive or acute appearing osseous lesions.  CT ABDOMEN FINDINGS  Hepatobiliary: Liver is normal in size and contour without focal hepatic lesion identified. Gallbladder is unremarkable. No intrahepatic or extrahepatic biliary ductal dilatation.  Pancreas: Unremarkable  Spleen: Unremarkable  Adrenals/Urinary Tract: Stable thickening of the bilateral adrenal glands. Kidneys enhance symmetrically with contrast. No hydronephrosis.  Stomach/Bowel: Postoperative changes compatible with esophagectomy and gastric pull-through. No evidence for abnormal bowel wall thickening or bowel obstruction. There is nonspecific mesenteric fat stranding within the visualized upper abdomen. No free intraperitoneal air or fluid.  Vascular/Lymphatic: Normal caliber abdominal aorta. No retroperitoneal lymphadenopathy.  Other: Postsurgical changes midline abdominal soft tissues.  Musculoskeletal: Lower lumbar spine degenerative changes. No aggressive or acute appearing osseous lesions.  IMPRESSION: Patient status post interval esophagectomy and pull-through without evidence for locally recurrent or distant metastatic disease.  Nonspecific mesenteric fat stranding within the visualized upper abdomen is favored to be postoperative in etiology.   Electronically Signed   By: Lovey Newcomer M.D.   On: 02/01/2015 08:34   Ct Abdomen W Contrast  02/01/2015   CLINICAL DATA:  Patient with distal esophageal carcinoma diagnosed 05/18/2014. Status post chemotherapy and radiation. Patient status post esophagectomy 09/05/2014.  EXAM: CT CHEST AND ABDOMEN WITHOUT CONTRAST  TECHNIQUE: Multidetector CT imaging of the chest and abdomen was  performed following the standard protocol without intravenous contrast.  COMPARISON:  CT chest 08/09/2014 ; PET-CT 05/29/2014  FINDINGS: CT CHEST FINDINGS  Mediastinum/Nodes: Right anterior chest wall  Port-A-Cath is present with tip terminating at the superior cavoatrial junction. No enlarged axillary, mediastinal or hilar lymphadenopathy. Normal heart size. No pericardial effusion. Postoperative changes compatible with esophagectomy and gastric pull-through. No mediastinal fluid collections are identified.  Lungs/Pleura: Central airways are patent. Minimal scarring and or atelectasis medial right lower lobe (image 38; series 4). No large nodular or consolidative pulmonary opacity. Minimal tiny 1 mm nodules within the peripheral lungs bilaterally are unchanged from prior. No pleural effusion or pneumothorax.  Musculoskeletal: Thoracic spine degenerative changes without aggressive or acute appearing osseous lesions.  CT ABDOMEN FINDINGS  Hepatobiliary: Liver is normal in size and contour without focal hepatic lesion identified. Gallbladder is unremarkable. No intrahepatic or extrahepatic biliary ductal dilatation.  Pancreas: Unremarkable  Spleen: Unremarkable  Adrenals/Urinary Tract: Stable thickening of the bilateral adrenal glands. Kidneys enhance symmetrically with contrast. No hydronephrosis.  Stomach/Bowel: Postoperative changes compatible with esophagectomy and gastric pull-through. No evidence for abnormal bowel wall thickening or bowel obstruction. There is nonspecific mesenteric fat stranding within the visualized upper abdomen. No free intraperitoneal air or fluid.  Vascular/Lymphatic: Normal caliber abdominal aorta. No retroperitoneal lymphadenopathy.  Other: Postsurgical changes midline abdominal soft tissues.  Musculoskeletal: Lower lumbar spine degenerative changes. No aggressive or acute appearing osseous lesions.  IMPRESSION: Patient status post interval esophagectomy and pull-through without evidence  for locally recurrent or distant metastatic disease.  Nonspecific mesenteric fat stranding within the visualized upper abdomen is favored to be postoperative in etiology.   Electronically Signed   By: Lovey Newcomer M.D.   On: 02/01/2015 08:34    Medications: I have reviewed the patient's current medications.  Assessment/Plan: 1. Adenocarcinoma the esophagus/GE junction-status post an endoscopy 05/18/2014 confirming a gastric cardia/lower esophageal mass with tumor extending proximally in the esophagus to 25 cm from the incisors  HER-2/neu amplified   Staging CT scan 05/22/2014 with indeterminate bilateral pulmonary nodules; and prominent paraesophageal, porta hepatis, and para-aortic lymph nodes   Staging PET scan 05/29/2014 confirmed hypermetabolic soft tissue thickening at the distal third of the esophagus extending into the proximal stomach. 2 additional smaller areas of hypermetabolism or noted in the more proximal esophagus with a mildly enlarged hypermetabolic gastrohepatic node.   Initiation of radiation and concurrent Taxol/carboplatin 06/11/2014; Taxol/carboplatin completed 07/09/2014; radiation completed 07/18/2014.  Esophagectomy/jejunostomy feeding tube placement 09/05/2014 with the pathology revealing aypT3ypN2 tumor, HER-2/neu amplified, positive distal resection margin   Cycle 1 FOLFOX 11/01/2014  Cycle 2 FOLFOX 11/15/2014  Cycle 3 FOLFOX 11/29/2014  Cycle 4 FOLFOX 12/13/2014  Cycle 5 FOLFOX 12/27/2014  Cycle 6 FOLFOX 01/17/2015  CTs of the chest, abdomen, and pelvis on 02/01/2015-negative for recurrent esophagus cancer 2. History of anorexia/weight loss 3. History of solid dysphagia -improved 4. Diabetes  5. Hypertension  6. Hyperlipidemia  7. History of back pain relieved with naproxen   Disposition:  Mr. Weckwerth is in clinical remission from esophagus cancer. He has completed the planned course of adjuvant therapy. The restaging CT showed no evidence  of recurrent disease. We will arrange for removal of the Port-A-Cath. He will return for an office visit in 4 months. He may be having reflux symptoms after eating a large meal. We discussed antireflux measures and he will discuss this with Dr. Servando Snare.  Betsy Coder, MD  02/01/2015  1:04 PM

## 2015-02-13 NOTE — Progress Notes (Signed)
Pain Status: He is currently in no pain.  Nutritional Status a) intake: PO Diet: Regular b) using a feeding tube?:NO c) weight changes, if any: Gained   BP 120/72 mmHg  Pulse 92  Temp(Src) 98.5 F (36.9 C)  Ht _0  (1.778 m)  Wt 202 lb 9.6 oz (91.899 kg)  BMI 29.07 kg/m2   Wt Readings from Last 3 Encounters:  02/01/15 197 lb 9.6 oz (89.631 kg)  01/17/15 198 lb 4.8 oz (89.948 kg)  12/27/14 198 lb 1.6 oz (89.858 kg)    Swallowing Status: Pt denies dysphagia.  Smoking or chewing tobacco? No smoking at this time History  Substance Use Topics  . Smoking status: Current Some Day Smoker -- 0.25 packs/day for 3 years    Types: Cigars  . Smokeless tobacco: Never Used     Comment: has a cigar occasionally  . Alcohol Use: 1.0 oz/week    2 Standard drinks or equivalent per week     Comment: social    Dental (if applicable): When was last visit with dentistry ~ 1 year ago  Using fluoride trays daily? None   When was last ENT visit? STates he has never seen a Head and Neck Physician When is next ENT visit? None   Summary of last medical oncology visit (if applicable) is Assessment/Plan: 1. Adenocarcinoma the esophagus/GE junction-status post an endoscopy 05/18/2014 confirming a gastric cardia/lower esophageal mass with tumor extending proximally in the esophagus to 25 cm from the incisors  HER-2/neu amplified   Staging CT scan 05/22/2014 with indeterminate bilateral pulmonary nodules; and prominent paraesophageal, porta hepatis, and para-aortic lymph nodes   Staging PET scan 05/29/2014 confirmed hypermetabolic soft tissue thickening at the distal third of the esophagus extending into the proximal stomach. 2 additional smaller areas of hypermetabolism or noted in the more proximal esophagus with a mildly enlarged hypermetabolic gastrohepatic node.   Initiation of radiation and concurrent Taxol/carboplatin 06/11/2014; Taxol/carboplatin completed 07/09/2014; radiation completed  07/18/2014.  Esophagectomy/jejunostomy feeding tube placement 09/05/2014 with the pathology revealing [id number] tumor, HER-2/neu amplified, positive distal resection margin   Cycle 1 FOLFOX 11/01/2014  Cycle 2 FOLFOX 11/15/2014  Cycle 3 FOLFOX 11/29/2014  Cycle 4 FOLFOX 12/13/2014  Cycle 5 FOLFOX 12/27/2014  Cycle 6 FOLFOX 01/17/2015  CTs of the chest, abdomen, and pelvis on 02/01/2015-negative for recurrent esophagus cancer 2. History of anorexia/weight loss 3. History of solid dysphagia -improved 4. Diabetes  5. Hypertension  6. Hyperlipidemia  History of back pain relieved with naproxen.    Next med/onc visit is in October - date unknown at this time.  Imaging done in the last month (if applicable) revealed: 38/25/05 Ct Chest W Contrast  02/01/2015 CLINICAL DATA: Patient with distal esophageal carcinoma diagnosed 05/18/2014. Status post chemotherapy and radiation. Patient status post esophagectomy 09/05/2014. EXAM: CT CHEST AND ABDOMEN WITHOUT CONTRAST TECHNIQUE: Multidetector CT imaging of the chest and abdomen was performed following the standard protocol without intravenous contrast. COMPARISON: CT chest 08/09/2014 ; PET-CT 05/29/2014 FINDINGS: CT CHEST FINDINGS Mediastinum/Nodes: Right anterior chest wall Port-A-Cath is present with tip terminating at the superior cavoatrial junction. No enlarged axillary, mediastinal or hilar lymphadenopathy. Normal heart size. No pericardial effusion. Postoperative changes compatible with esophagectomy and gastric pull-through. No mediastinal fluid collections are identified. Lungs/Pleura: Central airways are patent. Minimal scarring and or atelectasis medial right lower lobe (image 38; series 4). No large nodular or consolidative pulmonary opacity. Minimal tiny 1 mm nodules within the peripheral lungs bilaterally are unchanged from prior. No pleural effusion or  pneumothorax. Musculoskeletal: Thoracic spine degenerative changes  without aggressive or acute appearing osseous lesions. CT ABDOMEN FINDINGS Hepatobiliary: Liver is normal in size and contour without focal hepatic lesion identified. Gallbladder is unremarkable. No intrahepatic or extrahepatic biliary ductal dilatation. Pancreas: Unremarkable Spleen: Unremarkable Adrenals/Urinary Tract: Stable thickening of the bilateral adrenal glands. Kidneys enhance symmetrically with contrast. No hydronephrosis. Stomach/Bowel: Postoperative changes compatible with esophagectomy and gastric pull-through. No evidence for abnormal bowel wall thickening or bowel obstruction. There is nonspecific mesenteric fat stranding within the visualized upper abdomen. No free intraperitoneal air or fluid. Vascular/Lymphatic: Normal caliber abdominal aorta. No retroperitoneal lymphadenopathy. Other: Postsurgical changes midline abdominal soft tissues. Musculoskeletal: Lower lumbar spine degenerative changes. No aggressive or acute appearing osseous lesions. IMPRESSION: Patient status post interval esophagectomy and pull-through without evidence for locally recurrent or distant metastatic disease. Nonspecific mesenteric fat stranding within the visualized upper abdomen is favored to be postoperative in etiology. Electronically Signed By: Lovey Newcomer M.D. On: 02/01/2015 08:34   Ct Abdomen W Contrast  02/01/2015 CLINICAL DATA: Patient with distal esophageal carcinoma diagnosed 05/18/2014. Status post chemotherapy and radiation. Patient status post esophagectomy 09/05/2014. EXAM: CT CHEST AND ABDOMEN WITHOUT CONTRAST TECHNIQUE: Multidetector CT imaging of the chest and abdomen was performed following the standard protocol without intravenous contrast. COMPARISON: CT chest 08/09/2014 ; PET-CT 05/29/2014 FINDINGS: CT CHEST FINDINGS Mediastinum/Nodes: Right anterior chest wall Port-A-Cath is present with tip terminating at the superior cavoatrial junction. No enlarged axillary, mediastinal  or hilar lymphadenopathy. Normal heart size. No pericardial effusion. Postoperative changes compatible with esophagectomy and gastric pull-through. No mediastinal fluid collections are identified. Lungs/Pleura: Central airways are patent. Minimal scarring and or atelectasis medial right lower lobe (image 38; series 4). No large nodular or consolidative pulmonary opacity. Minimal tiny 1 mm nodules within the peripheral lungs bilaterally are unchanged from prior. No pleural effusion or pneumothorax. Musculoskeletal: Thoracic spine degenerative changes without aggressive or acute appearing osseous lesions. CT ABDOMEN FINDINGS Hepatobiliary: Liver is normal in size and contour without focal hepatic lesion identified. Gallbladder is unremarkable. No intrahepatic or extrahepatic biliary ductal dilatation. Pancreas: Unremarkable Spleen: Unremarkable Adrenals/Urinary Tract: Stable thickening of the bilateral adrenal glands. Kidneys enhance symmetrically with contrast. No hydronephrosis. Stomach/Bowel: Postoperative changes compatible with esophagectomy and gastric pull-through. No evidence for abnormal bowel wall thickening or bowel obstruction. There is nonspecific mesenteric fat stranding within the visualized upper abdomen. No free intraperitoneal air or fluid. Vascular/Lymphatic: Normal caliber abdominal aorta. No retroperitoneal lymphadenopathy. Other: Postsurgical changes midline abdominal soft tissues. Musculoskeletal: Lower lumbar spine degenerative changes. No aggressive or acute appearing osseous lesions. IMPRESSION: Patient status post interval esophagectomy and pull-through without evidence for locally recurrent or distant metastatic disease. Nonspecific mesenteric fat stranding within the visualized upper abdomen is favored to be postoperative in etiology. Electronically Signed By: Lovey Newcomer M.D. On: 02/01/2015 08:34   Other notable issues, if any: None

## 2015-02-15 ENCOUNTER — Encounter: Payer: Self-pay | Admitting: Radiation Oncology

## 2015-02-15 ENCOUNTER — Ambulatory Visit
Admission: RE | Admit: 2015-02-15 | Discharge: 2015-02-15 | Disposition: A | Discharge status: Home / Self Care | Payer: BLUE CROSS/BLUE SHIELD | Source: Ambulatory Visit | Attending: Radiation Oncology | Admitting: Radiation Oncology

## 2015-02-15 VITALS — BP 120/72 | HR 92 | Temp 98.5°F | Ht 70.0 in | Wt 202.6 lb

## 2015-02-15 DIAGNOSIS — C155 Malignant neoplasm of lower third of esophagus: Secondary | ICD-10-CM

## 2015-02-15 NOTE — Progress Notes (Signed)
Radiation Oncology         [phone] ________________________________  Name: IVO MOGA MRN: [id number]  Date: 02/15/2015  DOB: May 22, 1956  Follow-Up Visit Note  outpatient  CC: [name], MD  [name], MD  Diagnosis and Prior Radiotherapy: No diagnosis found.  [id number]  Stage III Gastro-esophageal junction adenocarcinoma  Indication for treatment:  Curative w/ concurrent chemotherapy (neoadjuvant)   Radiation treatment dates:   06/11/2014-07/18/2014  Site/dose:   1) esophagus and regional nodes / 45 Gy in 25 fractions 2) esophageal nodal boost / 5.4 Gy in 3 fractions  Narrative:  The patient returns today for routine follow-up. Pt denies dysphagia. Has early satiety. He is gaining weight with good oral intake. Doesn't have a feeding tube. He saw med/onc on 5/27 was told he is in clinical remission. Completed adjuvant therapy. CT of chest and abdomen on 02/01/15 showed he was negative for recurrent or metastatic disease. The pt's port-a-cath is scheduled to be removed on 02/25/15.  ALLERGIES:  is allergic to ace inhibitors.  Meds: Current Outpatient Prescriptions  Medication Sig Dispense Refill  . aspirin 81 MG tablet Take 81 mg by mouth daily.    . Cholecalciferol (VITAMIN D PO) Take 10,000 Units by mouth daily.    Marland Kitchen lidocaine-prilocaine (EMLA) cream Apply to port site one hour prior to use. Do not rub in. Cover with plastic. 30 g 1  . Naproxen Sodium 220 MG CAPS Take 1 capsule by mouth daily as needed (pain).    Marland Kitchen oxazepam (SERAX) 15 MG capsule Take 1-2 capsules 1 hour prior to bedtime PRN sleep. 60 capsule 3  . prochlorperazine (COMPAZINE) 10 MG tablet Take 1 tablet (10 mg total) by mouth every 6 (six) hours as needed for nausea or vomiting. 30 tablet 0  . testosterone (ANDROGEL) 50 MG/5GM GEL Place 5 g onto the skin daily. Apply 2 pumps daily  Dispense 1pump per month 1 Package 5   No current facility-administered medications for this encounter.     Physical Findings: The patient is in no acute distress. Patient is alert and oriented.  height is 5\' 10"  (1.778 m) and weight is 202 lb 9.6 oz (91.899 kg). His temperature is 98.5 F (36.9 C). His blood pressure is 120/72 and his pulse is 92.  General: Alert and oriented, in no acute distress HEENT: Head is normocephalic. Extraocular movements are intact. Oropharynx is clear. Neck: Neck is supple, no palpable cervical or supraclavicular lymphadenopathy. Heart: Regular in rate and rhythm with no murmurs, rubs, or gallops. Chest: Clear to auscultation bilaterally, with no rhonchi, wheezes, or rales. Abdomen: Soft, nontender, nondistended, with no rigidity or guarding. Midline abdominal scar and laparoscopic scar well healed. Extremities: No cyanosis or edema. Lymphatics: see Neck Exam Skin: No concerning lesions. Musculoskeletal: symmetric strength and muscle tone throughout. Neurologic: Cranial nerves II through XII are grossly intact. No obvious focalities. Speech is fluent. Coordination is intact. Psychiatric: Judgment and insight are intact. Affect is appropriate.  Lab Findings: Lab Results  Component Value Date   WBC 2.7* 01/17/2015   HGB 11.9* 01/17/2015   HCT 35.2* 01/17/2015   MCV 94.1 01/17/2015   PLT 149 01/17/2015    Radiographic Findings: Ct Chest W Contrast  02/01/2015   CLINICAL DATA:  Patient with distal esophageal carcinoma diagnosed 05/18/2014. Status post chemotherapy and radiation. Patient status post esophagectomy 09/05/2014.  EXAM: CT CHEST AND ABDOMEN WITHOUT CONTRAST  TECHNIQUE: Multidetector CT imaging of the chest and abdomen was performed following the standard  protocol without intravenous contrast.  COMPARISON:  CT chest 08/09/2014 ; PET-CT 05/29/2014  FINDINGS: CT CHEST FINDINGS  Mediastinum/Nodes: Right anterior chest wall Port-A-Cath is present with tip terminating at the superior cavoatrial junction. No enlarged axillary, mediastinal or hilar  lymphadenopathy. Normal heart size. No pericardial effusion. Postoperative changes compatible with esophagectomy and gastric pull-through. No mediastinal fluid collections are identified.  Lungs/Pleura: Central airways are patent. Minimal scarring and or atelectasis medial right lower lobe (image 38; series 4). No large nodular or consolidative pulmonary opacity. Minimal tiny 1 mm nodules within the peripheral lungs bilaterally are unchanged from prior. No pleural effusion or pneumothorax.  Musculoskeletal: Thoracic spine degenerative changes without aggressive or acute appearing osseous lesions.  CT ABDOMEN FINDINGS  Hepatobiliary: Liver is normal in size and contour without focal hepatic lesion identified. Gallbladder is unremarkable. No intrahepatic or extrahepatic biliary ductal dilatation.  Pancreas: Unremarkable  Spleen: Unremarkable  Adrenals/Urinary Tract: Stable thickening of the bilateral adrenal glands. Kidneys enhance symmetrically with contrast. No hydronephrosis.  Stomach/Bowel: Postoperative changes compatible with esophagectomy and gastric pull-through. No evidence for abnormal bowel wall thickening or bowel obstruction. There is nonspecific mesenteric fat stranding within the visualized upper abdomen. No free intraperitoneal air or fluid.  Vascular/Lymphatic: Normal caliber abdominal aorta. No retroperitoneal lymphadenopathy.  Other: Postsurgical changes midline abdominal soft tissues.  Musculoskeletal: Lower lumbar spine degenerative changes. No aggressive or acute appearing osseous lesions.  IMPRESSION: Patient status post interval esophagectomy and pull-through without evidence for locally recurrent or distant metastatic disease.  Nonspecific mesenteric fat stranding within the visualized upper abdomen is favored to be postoperative in etiology.   Electronically Signed   By: Lovey Newcomer M.D.   On: 02/01/2015 08:34   Ct Abdomen W Contrast  02/01/2015   CLINICAL DATA:  Patient with distal  esophageal carcinoma diagnosed 05/18/2014. Status post chemotherapy and radiation. Patient status post esophagectomy 09/05/2014.  EXAM: CT CHEST AND ABDOMEN WITHOUT CONTRAST  TECHNIQUE: Multidetector CT imaging of the chest and abdomen was performed following the standard protocol without intravenous contrast.  COMPARISON:  CT chest 08/09/2014 ; PET-CT 05/29/2014  FINDINGS: CT CHEST FINDINGS  Mediastinum/Nodes: Right anterior chest wall Port-A-Cath is present with tip terminating at the superior cavoatrial junction. No enlarged axillary, mediastinal or hilar lymphadenopathy. Normal heart size. No pericardial effusion. Postoperative changes compatible with esophagectomy and gastric pull-through. No mediastinal fluid collections are identified.  Lungs/Pleura: Central airways are patent. Minimal scarring and or atelectasis medial right lower lobe (image 38; series 4). No large nodular or consolidative pulmonary opacity. Minimal tiny 1 mm nodules within the peripheral lungs bilaterally are unchanged from prior. No pleural effusion or pneumothorax.  Musculoskeletal: Thoracic spine degenerative changes without aggressive or acute appearing osseous lesions.  CT ABDOMEN FINDINGS  Hepatobiliary: Liver is normal in size and contour without focal hepatic lesion identified. Gallbladder is unremarkable. No intrahepatic or extrahepatic biliary ductal dilatation.  Pancreas: Unremarkable  Spleen: Unremarkable  Adrenals/Urinary Tract: Stable thickening of the bilateral adrenal glands. Kidneys enhance symmetrically with contrast. No hydronephrosis.  Stomach/Bowel: Postoperative changes compatible with esophagectomy and gastric pull-through. No evidence for abnormal bowel wall thickening or bowel obstruction. There is nonspecific mesenteric fat stranding within the visualized upper abdomen. No free intraperitoneal air or fluid.  Vascular/Lymphatic: Normal caliber abdominal aorta. No retroperitoneal lymphadenopathy.  Other:  Postsurgical changes midline abdominal soft tissues.  Musculoskeletal: Lower lumbar spine degenerative changes. No aggressive or acute appearing osseous lesions.  IMPRESSION: Patient status post interval esophagectomy and pull-through without evidence for locally recurrent or  distant metastatic disease.  Nonspecific mesenteric fat stranding within the visualized upper abdomen is favored to be postoperative in etiology.   Electronically Signed   By: Lovey Newcomer M.D.   On: 02/01/2015 08:34    Impression/Plan: NED The pt has a CT scan scheduled in October per Dr. Benay Spice. I will order a January f/u with me. The pt scheduled to is to see Dr. Servando Snare next week. He knows to call if any concerns arise in the interim.  This document serves as a record of services personally performed by Eppie Gibson, MD. It was created on her behalf by Darcus Austin, a trained medical scribe. The creation of this record is based on the scribe's personal observations and the provider's statements to them. This document has been checked and approved by the attending provider.      _____________________________________   Eppie Gibson, MD

## 2015-02-18 ENCOUNTER — Telehealth: Payer: Self-pay | Admitting: *Deleted

## 2015-02-18 NOTE — Telephone Encounter (Signed)
CALLED PATIENT TO INFORM OF FU FOR 09/20/15 @ 4 PM WITH DR. Isidore Moos, SPOKE WITH PATIENT AND HE IS AWARE OF THIS APPT.

## 2015-02-25 ENCOUNTER — Encounter: Payer: Self-pay | Admitting: Internal Medicine

## 2015-02-25 ENCOUNTER — Other Ambulatory Visit: Payer: Self-pay | Admitting: Radiology

## 2015-02-25 ENCOUNTER — Ambulatory Visit (INDEPENDENT_AMBULATORY_CARE_PROVIDER_SITE_OTHER): Payer: BLUE CROSS/BLUE SHIELD | Admitting: Internal Medicine

## 2015-02-25 VITALS — BP 118/72 | HR 80 | Temp 97.7°F | Resp 16 | Ht 70.25 in | Wt 202.2 lb

## 2015-02-25 DIAGNOSIS — Z79899 Other long term (current) drug therapy: Secondary | ICD-10-CM

## 2015-02-25 DIAGNOSIS — I10 Essential (primary) hypertension: Secondary | ICD-10-CM

## 2015-02-25 DIAGNOSIS — E559 Vitamin D deficiency, unspecified: Secondary | ICD-10-CM

## 2015-02-25 DIAGNOSIS — E785 Hyperlipidemia, unspecified: Secondary | ICD-10-CM

## 2015-02-25 DIAGNOSIS — R7309 Other abnormal glucose: Secondary | ICD-10-CM

## 2015-02-25 DIAGNOSIS — Z125 Encounter for screening for malignant neoplasm of prostate: Secondary | ICD-10-CM

## 2015-02-25 DIAGNOSIS — R7303 Prediabetes: Secondary | ICD-10-CM

## 2015-02-25 NOTE — Progress Notes (Signed)
Patient ID: Joel Miller, male   DOB: 1956-02-06, 59 y.o.   MRN: [id number]   Annual Comprehensive Examination  This very nice 59 y.o. MWM presents for complete physical.  Patient has been followed for HTN, T2_NIDDM  Prediabetes, Hyperlipidemia, and Vitamin D Deficiency. In Sept 2015 he was dx'd with Ca of the Esophagus and underwent ChemoRadiation by Dr's Benay Spice & Lanell Persons  in Oct/MNov 2015. On Dec 30th he had an esophageal resection by Dr Servando Snare. Recently in Apr/May he received a "Boost" of Chemo by Dr Benay Spice because of pt's hx/o (+) LN. Over the course of the last year from tumor effect and treatment consequence, patient has lost 100#  and 150# over the last 2 years in part volitional. Because of this , he has been able to stop his BP, cholesterol  & diabetic meds.   His HTN predates since 2009 and now is in observation mode if he holds his weight down.  Patient's BP has been controlled at home.Today's BP: 118/72 mmHg. Patient denies any cardiac symptoms as chest pain, palpitations, shortness of breath, dizziness or ankle swelling.   Patient's hyperlipidemia is controlled with diet and likewise is in observation mode. Last lipids were at goal with TC 162 & LDL 65 on February 23, 2015.      Patient has hx/o T2_NIDDM since 2009 and he's also off treatment because of his weight loss and is an observational mode also. Patient denies reactive hypoglycemic symptoms, visual blurring, diabetic polys or paresthesias. Last A1c was 5.6% on February 23, 2015.        Finally, patient has history of Vitamin D Deficiency of 33 in 2010 and last vitamin D was 56 on 02/23/2015.      Medication Sig  . aspirin 81 MG tablet Take 81 mg by mouth daily.  . Cholecalciferol (VITAMIN D PO) Take 10,000 Units by mouth daily.  Marland Kitchen lidocaine-prilocaine (EMLA) cream Apply to port site one hour prior to use. Do not rub in. Cover with plastic.  . Naproxen Sodium 220 MG CAPS Take 1 capsule by mouth daily as needed (pain).  Marland Kitchen  oxazepam (SERAX) 15 MG capsule Take 1-2 capsules 1 hour prior to bedtime PRN sleep.  Marland Kitchen prochlorperazine (COMPAZINE) 10 MG tablet Take 1 tablet (10 mg total) by mouth every 6 (six) hours as needed for nausea or vomiting.  Marland Kitchen testosterone (ANDROGEL) 50 MG/5GM GEL Place 5 g onto the skin daily. Apply 2 pumps daily  Dispense 1pump per month   Allergies  Allergen Reactions  . Ace Inhibitors     cough   Past Medical History  Diagnosis Date  . Hyperlipidemia   . Obesity     lost 80 lbs  . Hypogonadism male   . Vitamin D deficiency   . 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 Allergy     HEY FEVER  . GERD (gastroesophageal reflux disease)   . S/P radiation therapy 06/11/2014-07/18/2014  . Hypertension     no med in 71months due to weight loss  . Diabetes mellitus without complication     type II no med 60 days    Health Maintenance  Topic Date Due  . HEMOGLOBIN A1C  April 26, 1956  . FOOT EXAM  11/12/1964  . HIV Screening  11/12/1969  . PNEUMOCOCCAL POLYSACCHARIDE VACCINE (2) 08/21/2014  . URINE MICROALBUMIN  10/09/2014  . ZOSTAVAX  11/13/2014  . INFLUENZA VACCINE  04/08/2015  . OPHTHALMOLOGY EXAM  02/25/2016  . COLONOSCOPY  09/25/2017  .  TETANUS/TDAP  05/28/2022   Immunization History  Administered Date(s) Administered  . Influenza,inj,Quad PF,36+ Mos 06/11/2014  . PPD Test 10/09/2013  . Pneumococcal-Unspecified 08/21/2009  . Tdap 05/28/2012   Past Surgical History  Procedure Laterality Date  . Colonoscopy    . 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 Complete esophagectomy N/A 09/05/2014    Procedure: TRANSHIATAL TOTAL ESOPHAGECTOMY; Cervical esophagogastrostomy;  Surgeon: [name], MD;  Location: [facility];  Service: Thoracic;  Laterality: N/A;  . Video bronchoscopy N/A 09/05/2014    Procedure: VIDEO BRONCHOSCOPY;  Surgeon: [name], MD;  Location: [facility];  Service: Thoracic;  Laterality: N/A;  . Jejunostomy N/A 09/05/2014    Procedure: FEEDING JEJUNOSTOMY;  Surgeon:  [name], MD;  Location: [facility];  Service: Thoracic;  Laterality: N/A;  . Pyloroplasty N/A 09/05/2014    Procedure: PYLOROPLASTY;  Surgeon: [name], MD;  Location: [facility];  Service: Thoracic;  Laterality: N/A;   Family History  Problem Relation Age of Onset  . Hypertension Mother   . Alzheimer's disease Mother   . Cancer Father 38    throat   History   Social History  . Marital Status: Married    Spouse Name: N/A  . Number of Children: 3  . Years of Education: N/A   Occupational History  . Sales Development worker, community    Social History Main Topics  . Smoking status: Current Some Day Smoker -- 0.25 packs/day for 3 years    Types: Cigars  . Smokeless tobacco: Never Used     Comment: has a cigar occasionally  . Alcohol Use: 1.0 oz/week    2 Standard drinks or equivalent per week     Comment: social  . Drug Use: No  . Sexual Activity: Not on file    ROS Constitutional: Denies fever, chills, weight loss/gain, headaches, insomnia,  night sweats or change in appetite. Does c/o fatigue. Eyes: Denies redness, blurred vision, diplopia, discharge, itchy or watery eyes.  ENT: Denies discharge, congestion, post nasal drip, epistaxis, sore throat, earache, hearing loss, dental pain, Tinnitus, Vertigo, Sinus pain or snoring.  Cardio: Denies chest pain, palpitations, irregular heartbeat, syncope, dyspnea, diaphoresis, orthopnea, PND, claudication or edema Respiratory: denies cough, dyspnea, DOE, pleurisy, hoarseness, laryngitis or wheezing.  Gastrointestinal: Denies dysphagia, heartburn, reflux, water brash, pain, cramps, nausea, vomiting, bloating, diarrhea, constipation, hematemesis, melena, hematochezia, jaundice or hemorrhoids Genitourinary: Denies dysuria, frequency, urgency, nocturia, hesitancy, discharge, hematuria or flank pain Musculoskeletal: Denies arthralgia, myalgia, stiffness, Jt. Swelling, pain, limp or strain/sprain. Denies Falls. Skin: Denies puritis, rash,  hives, warts, acne, eczema or change in skin lesion Neuro: No weakness, tremor, incoordination, spasms, paresthesia or pain Psychiatric: Denies confusion, memory loss or sensory loss. Denies Depression. Endocrine: Denies change in skin, hair change, nocturia, and paresthesia, diabetic polys, visual blurring or hyper / hypo glycemic episodes.  Heme/Lymph: No excessive bleeding, bruising or enlarged lymph nodes.  Physical Exam  BP 118/72   Pulse 80  Temp 97.7 F   Resp 16  Ht 5' 10.25"  Wt 202 lb 3.2 oz    BMI 28.82  General Appearance: Well nourished, in no apparent distress. Eyes: PERRLA, EOMs, conjunctiva no swelling or erythema, normal fundi and vessels. Sinuses: No frontal/maxillary tenderness ENT/Mouth: EACs patent / TMs  nl. Nares clear without erythema, swelling, mucoid exudates. Oral hygiene is good. No erythema, swelling, or exudate. Tongue normal, non-obstructing. Tonsils not swollen or erythematous. Hearing normal.  Neck: Supple, thyroid normal. No bruits, nodes or JVD. Respiratory:  Respiratory effort normal.  BS equal and clear bilateral without rales, rhonci, wheezing or stridor. Cardio: Heart sounds are normal with regular rate and rhythm and no murmurs, rubs or gallops. Peripheral pulses are normal and equal bilaterally without edema. No aortic or femoral bruits. Chest: symmetric with normal excursions and percussion.  Abdomen: Flat, soft, with bowel sounds. Nontender, no guarding, rebound, hernias, masses, or organomegaly.  Lymphatics: Non tender without lymphadenopathy.  Genitourinary: Declined by patient in difference to upcoming colonoscopy. Musculoskeletal: Full ROM all peripheral extremities, joint stability, 5/5 strength, and normal gait. Skin: Warm and dry without rashes, lesions, cyanosis, clubbing or  ecchymosis.  Neuro: Cranial nerves intact, reflexes equal bilaterally. Normal muscle tone, no cerebellar symptoms. Sensation intact.  Pysch: Awake and oriented X 3  with normal affect, insight and judgment appropriate.   Assessment and Plan  1. Essential hypertension   2. Prediabetes   3. Hyperlipidemia   4. Vitamin D deficiency   5. Prostate cancer screening   6. Medication management  - Urine Microscopic  - Patient brought labs from work to be scanned into the EMR which show  Nl CBC/diff, Chem/24, Testosterone 607 - Nl on Androgel,  A1c 5.6% - Nl ,  PSA 0.7-Nl, TC 162 / HDL 65 / TG 60 & LDL 85 - all Nl , TSH 2.030, and Nl Vit D 56.2.     Continue prudent diet as discussed, weight control, BP monitoring, regular exercise, and medications as discussed.  Discussed med effects and SE's. Routine screening labs and tests as requested with regular follow-up as recommended.  Over 40 minutes of exam, counseling &  chart review was performed

## 2015-02-25 NOTE — Patient Instructions (Signed)

## 2015-02-26 ENCOUNTER — Other Ambulatory Visit: Payer: Self-pay | Admitting: Radiology

## 2015-02-26 ENCOUNTER — Encounter: Payer: Self-pay | Admitting: Gastroenterology

## 2015-02-26 LAB — URINALYSIS, MICROSCOPIC ONLY
Bacteria, UA: NONE SEEN
CASTS: NONE SEEN
Crystals: NONE SEEN
SQUAMOUS EPITHELIAL / LPF: NONE SEEN

## 2015-02-27 ENCOUNTER — Ambulatory Visit (HOSPITAL_COMMUNITY)
Admission: RE | Admit: 2015-02-27 | Discharge: 2015-02-27 | Disposition: A | Discharge status: Home / Self Care | Payer: BLUE CROSS/BLUE SHIELD | Source: Ambulatory Visit | Attending: Oncology | Admitting: Oncology

## 2015-02-27 DIAGNOSIS — Z8501 Personal history of malignant neoplasm of esophagus: Secondary | ICD-10-CM | POA: Insufficient documentation

## 2015-02-27 DIAGNOSIS — Z923 Personal history of irradiation: Secondary | ICD-10-CM | POA: Insufficient documentation

## 2015-02-27 DIAGNOSIS — Z9221 Personal history of antineoplastic chemotherapy: Secondary | ICD-10-CM | POA: Diagnosis not present

## 2015-02-27 DIAGNOSIS — Z452 Encounter for adjustment and management of vascular access device: Secondary | ICD-10-CM | POA: Diagnosis present

## 2015-02-27 DIAGNOSIS — C159 Malignant neoplasm of esophagus, unspecified: Secondary | ICD-10-CM

## 2015-02-27 LAB — CBC
HEMATOCRIT: 35 % — AB (ref 39.0–52.0)
Hemoglobin: 11.9 g/dL — ABNORMAL LOW (ref 13.0–17.0)
MCH: 33.7 pg (ref 26.0–34.0)
MCHC: 34 g/dL (ref 30.0–36.0)
MCV: 99.2 fL (ref 78.0–100.0)
Platelets: 170 10*3/uL (ref 150–400)
RBC: 3.53 MIL/uL — AB (ref 4.22–5.81)
RDW: 14.6 % (ref 11.5–15.5)
WBC: 5.4 10*3/uL (ref 4.0–10.5)

## 2015-02-27 LAB — PROTIME-INR
INR: 1.16 (ref 0.00–1.49)
Prothrombin Time: 15 seconds (ref 11.6–15.2)

## 2015-02-27 LAB — APTT: aPTT: 32 seconds (ref 24–37)

## 2015-02-27 MED ORDER — CEFAZOLIN SODIUM-DEXTROSE 2-3 GM-% IV SOLR
2.0000 g | Freq: Once | INTRAVENOUS | Status: AC
  Administered 2015-02-27: 2 g via INTRAVENOUS

## 2015-02-27 MED ORDER — SODIUM CHLORIDE 0.9 % IV SOLN
Freq: Once | INTRAVENOUS | Status: AC
  Administered 2015-02-27: 15:00:00 via INTRAVENOUS

## 2015-02-27 MED ORDER — LIDOCAINE-EPINEPHRINE 2 %-1:100000 IJ SOLN
INTRAMUSCULAR | Status: AC
  Filled 2015-02-27: qty 1

## 2015-02-27 MED ORDER — CEFAZOLIN SODIUM-DEXTROSE 2-3 GM-% IV SOLR
INTRAVENOUS | Status: AC
  Filled 2015-02-27: qty 50

## 2015-02-27 NOTE — Procedures (Signed)
Successful removal of right anterior chest wall port-a-cath. No immediate post procedural complications.  

## 2015-02-27 NOTE — Discharge Instructions (Signed)
Incision Care An incision is when a surgeon cuts into your body tissues. After surgery, the incision needs to be cared for properly to prevent infection.  HOME CARE INSTRUCTIONS   Take all medicine as directed by your caregiver. Only take over-the-counter or prescription medicines for pain, discomfort, or fever as directed by your caregiver.  Do not remove your bandage (dressing) or get your incision wet until your surgeon gives you permission. In the event that your dressing becomes wet, dirty, or starts to smell, change the dressing and call your surgeon for instructions as soon as possible.  Take showers. Do not take tub baths, swim, or do anything that may soak the wound until it is healed.  Resume your normal diet and activities as directed or allowed.  Avoid lifting any weight until you are instructed otherwise.  Use anti-itch antihistamine medicine as directed by your caregiver. The wound may itch when it is healing. Do not pick or scratch at the wound.   Follow up with your caregiver for stitch (suture) or staple removal as directed.  Drink enough fluids to keep your urine clear or pale yellow. SEEK MEDICAL CARE IF:   You have redness, swelling, or increasing pain in the wound that is not controlled with medicine.  You have drainage, blood, or pus coming from the wound that lasts longer than 1 day.  You develop muscle aches, chills, or a general ill feeling.  You notice a bad smell coming from the wound or dressing.  Your wound edges separate after the sutures, staples, or skin adhesive strips have been removed.  You develop persistent nausea or vomiting. SEEK IMMEDIATE MEDICAL CARE IF:   You have a fever.  You develop a rash.  You develop dizzy episodes or faint while standing.  You have difficulty breathing.  You develop any reaction or side effects to medicine given. MAKE SURE YOU:   Understand these instructions.  Will watch your condition.  Will get help  right away if you are not doing well or get worse. Document Released: 03/13/2005 Document Revised: 11/16/2011 Document Reviewed: 10/18/2013 [facility] Patient Information 2015 [place], [place]. This information is not intended to replace advice given to you by your health care provider. Make sure you discuss any questions you have with your health care provider.   May remove dressing and shower 24 hours post procedure. Keep incision clean and dry.

## 2015-02-28 ENCOUNTER — Ambulatory Visit: Payer: BLUE CROSS/BLUE SHIELD | Admitting: Cardiothoracic Surgery

## 2015-03-01 ENCOUNTER — Ambulatory Visit (INDEPENDENT_AMBULATORY_CARE_PROVIDER_SITE_OTHER): Payer: BLUE CROSS/BLUE SHIELD | Admitting: Cardiothoracic Surgery

## 2015-03-01 ENCOUNTER — Encounter: Payer: Self-pay | Admitting: Cardiothoracic Surgery

## 2015-03-01 VITALS — BP 127/85 | HR 71 | Resp 16 | Ht 70.0 in | Wt 202.0 lb

## 2015-03-01 DIAGNOSIS — C155 Malignant neoplasm of lower third of esophagus: Secondary | ICD-10-CM | POA: Diagnosis not present

## 2015-03-01 NOTE — Progress Notes (Signed)
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 SURGICAL PROCEDURE: Video bronchoscopy, transhiatal total esophagectomy with cervical esophagogastrostomy, pyloroplasty, and feeding jejunostomy. SURGEON: Joel Bal, MD  Carcinoma of distal third of esophagus   Staging form: Esophagus - Adenocarcinoma, AJCC 7th Edition     Clinical: TX, N2 - Unsigned     Pathologic stage from 09/10/2014: Stage IIIB (yT3, N2, cM0, G2 - Moderately well differentiated) - Signed by Joel Isaac, MD on 09/11/2014   History of Present Illness:     Patient is making good progress postoperatively. Taking his diet well with frequent small meals, he's had no reflux symptoms, no dumping syndrome. He is maintaining his weight approximately 206 pounds. Patient feels well. No trouble swallowing. He has had second option at [facility] concerning postop chemo, completed 7 weeks of folfox.  Patient notes that he feels well, has increasing levels of energy, has been out playing golf and being physically active. He notes that his by mouth intake has been good he denies any difficulty with swallowing, has some symptoms of reflux if he eats near bedtime, has no dumping syndrome.     Past Medical History  Diagnosis Date  . Hyperlipidemia   . Obesity     lost 80 lbs  . Hypogonadism male   . Vitamin D deficiency   . 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 Allergy     HEY FEVER  . GERD (gastroesophageal reflux disease)   . S/P radiation therapy 06/11/2014-07/18/2014  . Hypertension     no med in 83months due to weight loss    . Diabetes mellitus without complication     type II no med 60 days      History  Smoking status  . Current Some Day Smoker -- 0.25 packs/day for 3 years  . Types: Cigars  Smokeless tobacco  . Never Used    Comment: has a cigar occasionally    History  Alcohol Use  . 1.0 oz/week  . 2 Standard drinks or equivalent per week    Comment: social     Allergies  Allergen Reactions  . Ace Inhibitors     cough    Current Outpatient Prescriptions  Medication Sig Dispense Refill  . aspirin 81 MG tablet Take 81 mg by mouth daily.    . Cholecalciferol (VITAMIN D PO) Take 10,000 Units by mouth daily.    Marland Kitchen lidocaine-prilocaine (EMLA) cream Apply to port site one hour prior to use. Do not rub in. Cover with plastic. 30 g 1  . Multiple Vitamin (MULTIVITAMIN) capsule Take 1 capsule by mouth daily.    . Naproxen Sodium 220 MG CAPS Take 1 capsule by mouth daily as needed (pain).    Marland Kitchen oxazepam (SERAX) 15 MG capsule Take 1-2 capsules 1 hour prior to bedtime PRN sleep. 60 capsule 3  . prochlorperazine (COMPAZINE) 10 MG tablet Take 1 tablet (10 mg total) by mouth every 6 (six) hours as needed for nausea or vomiting. 30 tablet 0  . testosterone (ANDROGEL) 50  MG/5GM GEL Place 5 g onto the skin daily. Apply 2 pumps daily  Dispense 1pump per month 1 Package 5   No current facility-administered medications for this visit.   Wt Readings from Last 3 Encounters:  02/25/15 202 lb 3.2 oz (91.717 kg)  02/15/15 202 lb 9.6 oz (91.899 kg)  02/01/15 197 lb 9.6 oz (89.631 kg)   Physical Exam: There were no vitals taken for this visit.  General appearance: alert, cooperative and appears stated age Neurologic: intact Heart: regular rate and rhythm, S1, S2 normal, no murmur, click, rub or gallop Lungs: clear to auscultation bilaterally Abdomen: soft, non-tender; bowel sounds normal; no masses,  no organomegaly and Abdominal wound is healing well Extremities: extremities normal, atraumatic, no cyanosis or  edema and Homans sign is negative, no sign of DVT Wound: Left neck incision is healing well Patient's feeding jejunostomy tube, is removed today without difficulty in its entirety   Diagnostic Studies & Laboratory data:     Recent Radiology Findings:  Ct Chest W Contrast  02/01/2015   CLINICAL DATA:  Patient with distal esophageal carcinoma diagnosed 05/18/2014. Status post chemotherapy and radiation. Patient status post esophagectomy 09/05/2014.  EXAM: CT CHEST AND ABDOMEN WITHOUT CONTRAST  TECHNIQUE: Multidetector CT imaging of the chest and abdomen was performed following the standard protocol without intravenous contrast.  COMPARISON:  CT chest 08/09/2014 ; PET-CT 05/29/2014  FINDINGS: CT CHEST FINDINGS  Mediastinum/Nodes: Right anterior chest wall Port-A-Cath is present with tip terminating at the superior cavoatrial junction. No enlarged axillary, mediastinal or hilar lymphadenopathy. Normal heart size. No pericardial effusion. Postoperative changes compatible with esophagectomy and gastric pull-through. No mediastinal fluid collections are identified.  Lungs/Pleura: Central airways are patent. Minimal scarring and or atelectasis medial right lower lobe (image 38; series 4). No large nodular or consolidative pulmonary opacity. Minimal tiny 1 mm nodules within the peripheral lungs bilaterally are unchanged from prior. No pleural effusion or pneumothorax.  Musculoskeletal: Thoracic spine degenerative changes without aggressive or acute appearing osseous lesions.  CT ABDOMEN FINDINGS  Hepatobiliary: Liver is normal in size and contour without focal hepatic lesion identified. Gallbladder is unremarkable. No intrahepatic or extrahepatic biliary ductal dilatation.  Pancreas: Unremarkable  Spleen: Unremarkable  Adrenals/Urinary Tract: Stable thickening of the bilateral adrenal glands. Kidneys enhance symmetrically with contrast. No hydronephrosis.  Stomach/Bowel: Postoperative changes compatible with  esophagectomy and gastric pull-through. No evidence for abnormal bowel wall thickening or bowel obstruction. There is nonspecific mesenteric fat stranding within the visualized upper abdomen. No free intraperitoneal air or fluid.  Vascular/Lymphatic: Normal caliber abdominal aorta. No retroperitoneal lymphadenopathy.  Other: Postsurgical changes midline abdominal soft tissues.  Musculoskeletal: Lower lumbar spine degenerative changes. No aggressive or acute appearing osseous lesions.  IMPRESSION: Patient status post interval esophagectomy and pull-through without evidence for locally recurrent or distant metastatic disease.  Nonspecific mesenteric fat stranding within the visualized upper abdomen is favored to be postoperative in etiology.   Electronically Signed   By: Lovey Newcomer M.D.   On: 02/01/2015 08:34       Recent Lab Findings: Lab Results  Component Value Date   WBC 5.4 02/27/2015   HGB 11.9* 02/27/2015   HCT 35.0* 02/27/2015   PLT 170 02/27/2015   GLUCOSE 125 01/17/2015   ALT 24 01/17/2015   AST 34 01/17/2015   NA 138 01/17/2015   K 4.4 01/17/2015   CL 105 10/30/2014   CREATININE 0.8 01/17/2015   BUN 19.6 01/17/2015   CO2 26 01/17/2015   INR 1.16 02/27/2015  Assessment / Plan:      1. Adenocarcinoma the esophagus/GE junction-status post an endoscopy 05/18/2014 confirming a gastric cardia/lower esophageal mass with tumor extending proximally in the esophagus to 25 cm from the incisors             doing well after resection, because of extensive disease and microscopic positive margin ( negative on frozen section) -patient doing well postoperatively, without evidence of recurrence on CT scan on physical exam. Plan to see him back in 6 months.         Joel Isaac MD      [address] [place] [address] Office [phone]   Beeper [phone]  03/01/2015 10:40 AM

## 2015-03-03 ENCOUNTER — Encounter: Payer: Self-pay | Admitting: *Deleted

## 2015-03-03 NOTE — Addendum Note (Signed)
Addended by: Mylasia Vorhees A on: 03/03/2015 10:02 AM   Modules accepted: Orders

## 2015-03-04 ENCOUNTER — Other Ambulatory Visit: Payer: Self-pay

## 2015-04-19 ENCOUNTER — Ambulatory Visit (AMBULATORY_SURGERY_CENTER): Payer: Self-pay

## 2015-04-19 VITALS — Ht 70.0 in | Wt 208.4 lb

## 2015-04-19 DIAGNOSIS — Z860100 Personal history of colon polyps, unspecified: Secondary | ICD-10-CM

## 2015-04-19 MED ORDER — SUPREP BOWEL PREP KIT 17.5-3.13-1.6 GM/177ML PO SOLN: 1.0000 | Freq: Once | ORAL | Status: DC

## 2015-04-19 NOTE — Progress Notes (Signed)
No allergies to eggs or soy No diet/weight loss meds No home oxygen No past problems with anesthesia  Refused Emmi 

## 2015-04-25 ENCOUNTER — Ambulatory Visit (INDEPENDENT_AMBULATORY_CARE_PROVIDER_SITE_OTHER): Payer: BLUE CROSS/BLUE SHIELD | Admitting: Internal Medicine

## 2015-04-25 ENCOUNTER — Encounter: Payer: Self-pay | Admitting: Internal Medicine

## 2015-04-25 ENCOUNTER — Encounter: Payer: Self-pay | Admitting: Oncology

## 2015-04-25 ENCOUNTER — Ambulatory Visit (HOSPITAL_COMMUNITY)
Admission: RE | Admit: 2015-04-25 | Discharge: 2015-04-25 | Disposition: A | Discharge status: Home / Self Care | Payer: BLUE CROSS/BLUE SHIELD | Source: Ambulatory Visit | Attending: Internal Medicine | Admitting: Internal Medicine

## 2015-04-25 VITALS — BP 120/82 | HR 88 | Temp 97.9°F | Resp 16 | Ht 70.25 in | Wt 204.2 lb

## 2015-04-25 DIAGNOSIS — E559 Vitamin D deficiency, unspecified: Secondary | ICD-10-CM | POA: Diagnosis not present

## 2015-04-25 DIAGNOSIS — Z6829 Body mass index (BMI) 29.0-29.9, adult: Secondary | ICD-10-CM

## 2015-04-25 DIAGNOSIS — M545 Low back pain, unspecified: Secondary | ICD-10-CM

## 2015-04-25 DIAGNOSIS — R059 Cough, unspecified: Secondary | ICD-10-CM

## 2015-04-25 DIAGNOSIS — M546 Pain in thoracic spine: Principal | ICD-10-CM

## 2015-04-25 DIAGNOSIS — Z79899 Other long term (current) drug therapy: Secondary | ICD-10-CM

## 2015-04-25 DIAGNOSIS — R5383 Other fatigue: Secondary | ICD-10-CM | POA: Diagnosis not present

## 2015-04-25 DIAGNOSIS — M5136 Other intervertebral disc degeneration, lumbar region: Secondary | ICD-10-CM | POA: Insufficient documentation

## 2015-04-25 DIAGNOSIS — R05 Cough: Secondary | ICD-10-CM | POA: Diagnosis present

## 2015-04-25 DIAGNOSIS — I10 Essential (primary) hypertension: Secondary | ICD-10-CM | POA: Diagnosis not present

## 2015-04-25 DIAGNOSIS — E785 Hyperlipidemia, unspecified: Secondary | ICD-10-CM

## 2015-04-25 DIAGNOSIS — R7309 Other abnormal glucose: Secondary | ICD-10-CM

## 2015-04-25 DIAGNOSIS — M47814 Spondylosis without myelopathy or radiculopathy, thoracic region: Secondary | ICD-10-CM | POA: Insufficient documentation

## 2015-04-25 DIAGNOSIS — R7303 Prediabetes: Secondary | ICD-10-CM

## 2015-04-25 LAB — BASIC METABOLIC PANEL WITH GFR
BUN: 13 mg/dL (ref 7–25)
CALCIUM: 9.3 mg/dL (ref 8.6–10.3)
CO2: 25 mmol/L (ref 20–31)
Chloride: 103 mmol/L (ref 98–110)
Creat: 0.79 mg/dL (ref 0.70–1.25)
GLUCOSE: 121 mg/dL — AB (ref 65–99)
Potassium: 4.2 mmol/L (ref 3.5–5.3)
Sodium: 138 mmol/L (ref 135–146)

## 2015-04-25 LAB — LIPID PANEL
Cholesterol: 134 mg/dL (ref 125–200)
HDL: 41 mg/dL (ref >=40)
LDL Cholesterol: 79 mg/dL (ref <130)
TRIGLYCERIDES: 72 mg/dL (ref <150)
Total CHOL/HDL Ratio: 3.3 Ratio (ref <=5.0)
VLDL: 14 mg/dL (ref <30)

## 2015-04-25 LAB — CBC WITH DIFFERENTIAL/PLATELET
BASOS PCT: 1 % (ref 0–1)
Basophils Absolute: 0.1 10*3/uL (ref 0.0–0.1)
EOS PCT: 2 % (ref 0–5)
Eosinophils Absolute: 0.1 10*3/uL (ref 0.0–0.7)
HEMATOCRIT: 40.1 % (ref 39.0–52.0)
Hemoglobin: 13.9 g/dL (ref 13.0–17.0)
Lymphocytes Relative: 6 % — ABNORMAL LOW (ref 12–46)
Lymphs Abs: 0.4 10*3/uL — ABNORMAL LOW (ref 0.7–4.0)
MCH: 31.4 pg (ref 26.0–34.0)
MCHC: 34.7 g/dL (ref 30.0–36.0)
MCV: 90.5 fL (ref 78.0–100.0)
MONO ABS: 0.7 10*3/uL (ref 0.1–1.0)
MONOS PCT: 10 % (ref 3–12)
MPV: 10.5 fL (ref 8.6–12.4)
Neutro Abs: 5.3 10*3/uL (ref 1.7–7.7)
Neutrophils Relative %: 81 % — ABNORMAL HIGH (ref 43–77)
Platelets: 203 10*3/uL (ref 150–400)
RBC: 4.43 MIL/uL (ref 4.22–5.81)
RDW: 12.6 % (ref 11.5–15.5)
WBC: 6.5 10*3/uL (ref 4.0–10.5)

## 2015-04-25 LAB — HEPATIC FUNCTION PANEL
ALT: 10 U/L (ref 9–46)
AST: 22 U/L (ref 10–35)
Albumin: 3.8 g/dL (ref 3.6–5.1)
Alkaline Phosphatase: 97 U/L (ref 40–115)
BILIRUBIN INDIRECT: 0.3 mg/dL (ref 0.2–1.2)
Bilirubin, Direct: 0.1 mg/dL (ref <=0.2)
TOTAL PROTEIN: 6.5 g/dL (ref 6.1–8.1)
Total Bilirubin: 0.4 mg/dL (ref 0.2–1.2)

## 2015-04-25 LAB — MAGNESIUM: Magnesium: 2 mg/dL (ref 1.5–2.5)

## 2015-04-25 LAB — HEMOGLOBIN A1C
Hgb A1c MFr Bld: 5.9 % — ABNORMAL HIGH (ref <5.7)
MEAN PLASMA GLUCOSE: 123 mg/dL — AB (ref <117)

## 2015-04-25 LAB — IRON AND TIBC
%SAT: 20 % (ref 20–55)
IRON: 72 ug/dL (ref 42–165)
TIBC: 369 ug/dL (ref 215–435)
UIBC: 297 ug/dL (ref 125–400)

## 2015-04-25 MED ORDER — BENZONATATE 200 MG PO CAPS: ORAL_CAPSULE | ORAL | Status: DC

## 2015-04-25 NOTE — Patient Instructions (Signed)

## 2015-04-25 NOTE — Progress Notes (Signed)
Subjective:    Patient ID: Joel Miller, male    DOB: Dec 13, 1955, 59 y.o.   MRN: [id number]  HPI  Patient has hx/o HTN, HLD & PreDM - all in remission with patient having lost 150# over the last year since undergoing distal esophagectomy for cancer in Dec 2015 by Dr Servando Snare. Initial Dx was in Sept 2015 and patient underwent induction with chemoRadiation per Dr's Benay Spice and Lanell Persons. Then he underwent f/u salvage Chemo in [place] per Dr Benay Spice.  Now patient presents with c/o ongoing mid & low back pains for months & worsening over the last 2 weeks.  Also he reports 2-3 week hx/o uncontrollable spastic and nonproductive cough and worsening fatigue.He denies fever, chills, rash. Patient reports anorexia & denies any diabetic poly , paresthesias or visual blurring. C/0 DOE attributed to deconditioning. No CP, palpitations, orthopnea or PND. No GI or GU sx's.   Medication Sig  . aspirin 81 MG tablet Take 81 mg by mouth daily.  . Cholecalciferol (VITAMIN D PO) Take 10,000 Units by mouth daily.  . Multiple Vitamin  Take 1 capsule by mouth daily.  . Naproxen Sodium 220 MG CAPS Take 1 capsule by mouth daily as needed (pain).  Marland Kitchen oxazepam (SERAX) 15 MG capsule Take 1-2 capsules 1 hour prior to bedtime PRN sleep.  Manus Gunning BOWEL PREP SOLN Take 1 kit by mouth once.   Allergies  Allergen Reactions  . Ace Inhibitors     cough   Past Medical History  Diagnosis Date  . Hyperlipidemia   . Obesity     lost 80 lbs  . Hypogonadism male   . Vitamin D deficiency   . 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 Allergy     HEY FEVER  . GERD (gastroesophageal reflux disease)     pt feels it was related to cancer  . S/P radiation therapy 06/11/2014-07/18/2014  . Hypertension     no med in 74month due to weight loss  . Diabetes mellitus without complication     type II no med 60 days    Past Surgical History  Procedure Laterality Date  . Colonoscopy    . 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 Complete  esophagectomy N/A 09/05/2014    Procedure: TRANSHIATAL TOTAL ESOPHAGECTOMY; Cervical esophagogastrostomy;  Surgeon: EGrace Isaac MD;  Location: [facility]  Service: Thoracic;  Laterality: N/A;  . Video bronchoscopy N/A 09/05/2014    Procedure: VIDEO BRONCHOSCOPY;  Surgeon: EGrace Isaac MD;  [facility] [facility]  Service: Thoracic;  Laterality: N/A;  . Jejunostomy N/A 09/05/2014    Procedure: FEEDING JEJUNOSTOMY;  Surgeon: EGrace Isaac MD;  [place] [facility]  Service: Thoracic;  Laterality: N/A;  . Pyloroplasty N/A 09/05/2014    Procedure: PYLOROPLASTY;  Surgeon: EGrace Isaac MD;  [facility] [facility]  Service: Thoracic;  Laterality: N/A;  . Wisdom tooth extraction      2010   Review of Systems 10 point systems review negative except as above.    Objective:   Physical Exam  BP 120/82 mmHg  Pulse 88  Temp(Src) 97.9 F (36.6 C)  Resp 16  Ht 5' 10.25" (1.784 m)  Wt 204 lb 3.2 oz (92.625 kg)  BMI 29.10 kg/m2  HEENT - Eac's patent. TM's Nl. EOM's full. PERRLA. NasoOroPharynx clear. Neck - supple. Nl Thyroid. Carotids 2+ & No bruits, nodes, JVD Chest - Clear equal BS w/o Rales, rhonchi, wheezes. Cor - Nl HS. RRR w/o sig MGR.  PP 1(+). No edema. Abd - No palpable organomegaly, masses or tenderness. BS nl. MS-  Tender Lt>Rt mid to Lower paraspinous spasm. FROM w/o deformities. Muscle power, tone and bulk Nl. Gait Nl. Neuro - No obvious Cr N abnormalities. Sensory, motor and Cerebellar functions appear Nl w/o focal abnormalities. Psyche - Mental status normal & appropriate.  No delusions, ideations or obvious mood abnormalities.    Assessment & Plan:   1. Cough  - DG Chest 2 View; Future - benzonatate (TESSALON) 200 MG capsule; Take 1 capsule 3 x daily to prevent cough  Dispense: 90 capsule; Refill: 1  2. Other fatigue  - Vitamin B12 - Iron and TIBC - TSH  3. Hyperlipidemia  - Lipid panel  4. Vitamin D deficiency  - Vit D  25 hydroxy   5. Medication  management  - CBC with Differential/Platelet - BASIC METABOLIC PANEL WITH GFR - Hepatic function panel - Magnesium  6. Prediabetes  - Hemoglobin A1c - Insulin, random  7. Essential hypertension  - TSH  8. Back pain of thoracolumbar region  - DG Thoracic Spine W/Swimmers; Future - DG Lumbar Spine Complete; Future  Discussed Meds & SE's. Over 35 minutes of exam, counseling, chart review and high complex critical decision making was performed

## 2015-04-26 LAB — TSH: TSH: 1.106 u[IU]/mL (ref 0.350–4.500)

## 2015-04-26 LAB — INSULIN, RANDOM: Insulin: 7.6 u[IU]/mL (ref 2.0–19.6)

## 2015-04-26 LAB — VITAMIN B12: Vitamin B-12: 502 pg/mL (ref 211–911)

## 2015-04-26 LAB — VITAMIN D 25 HYDROXY (VIT D DEFICIENCY, FRACTURES): Vit D, 25-Hydroxy: 51 ng/mL (ref 30–100)

## 2015-04-29 ENCOUNTER — Other Ambulatory Visit: Payer: Self-pay | Admitting: Internal Medicine

## 2015-04-29 ENCOUNTER — Encounter: Payer: Self-pay | Admitting: Internal Medicine

## 2015-04-29 DIAGNOSIS — M545 Low back pain: Secondary | ICD-10-CM

## 2015-04-29 MED ORDER — TRAMADOL HCL 50 MG PO TABS: ORAL_TABLET | ORAL | Status: DC

## 2015-05-02 ENCOUNTER — Encounter: Payer: Self-pay | Admitting: Gastroenterology

## 2015-05-02 ENCOUNTER — Ambulatory Visit (AMBULATORY_SURGERY_CENTER): Payer: BLUE CROSS/BLUE SHIELD | Admitting: Gastroenterology

## 2015-05-02 VITALS — BP 88/58 | HR 86 | Temp 95.9°F | Resp 29 | Ht 70.0 in | Wt 208.0 lb

## 2015-05-02 DIAGNOSIS — Z86010 Personal history of colonic polyps: Secondary | ICD-10-CM

## 2015-05-02 DIAGNOSIS — K648 Other hemorrhoids: Secondary | ICD-10-CM

## 2015-05-02 MED ORDER — SODIUM CHLORIDE 0.9 % IV SOLN: 500.0000 mL | INTRAVENOUS | Status: DC

## 2015-05-02 NOTE — Progress Notes (Signed)
Transferred to recovery room. A/O x3, pleased with MAC.  VSS.  Report to Jane, RN. 

## 2015-05-02 NOTE — Op Note (Signed)
[place]  [address]. [place], [id number]   COLONOSCOPY PROCEDURE REPORT  PATIENT: Joel Miller, Joel Miller  MR#: [id number] BIRTHDATE: [birth year] , 59  yrs. old GENDER: male ENDOSCOPIST: Inda Castle, MD REFERRED [id number] Melford Aase, M.D. PROCEDURE DATE:  05/02/2015 PROCEDURE:   Colonoscopy, surveillance First Screening Colonoscopy - Avg.  risk and is 50 yrs.  old or older - No.  Prior Negative Screening - Now for repeat screening. N/A  History of Adenoma - Now for follow-up colonoscopy & has been > or = to 3 yrs.  Yes hx of adenoma.  Has been 3 or more years since last colonoscopy.  Polyps removed today? No Recommend repeat exam, <10 yrs? No ASA CLASS:   Class III INDICATIONS:PH Colon Adenoma. 2009 MEDICATIONS: Monitored anesthesia care and Propofol 250 mg IV  DESCRIPTION OF PROCEDURE:   After the risks benefits and alternatives of the procedure were thoroughly explained, informed consent was obtained.  The digital rectal exam revealed no abnormalities of the rectum.   The LB [id number]  endoscope was introduced through the anus and advanced to the cecum, which was identified by both the appendix and ileocecal valve. No adverse events experienced.   The quality of the prep was (Suprep was used) fair.  The instrument was then slowly withdrawn as the colon was fully examined. Estimated blood loss is zero unless otherwise noted in this procedure report.      COLON FINDINGS: Internal hemorrhoids were found.   The examination was otherwise normal.  Retroflexed views revealed no abnormalities. The time to cecum = 4.7 Withdrawal time = 6.6   The scope was withdrawn and the procedure completed. COMPLICATIONS: There were no immediate complications.  ENDOSCOPIC IMPRESSION: 1.   Internal hemorrhoids 2.   The examination was otherwise normal  RECOMMENDATIONS: Repeat Colonscopy in 10 years.  eSigned:  Inda Castle, MD 05/02/2015 2:06  PM   cc:

## 2015-05-02 NOTE — Progress Notes (Signed)
Vital signs not transferred after initial reading.  Pt. Vital signs were stable throughout recovery period.  Pt had app. Scheduled to have cough evaluated in October.  Has hx. Of esophageal surgery.  Dr. Deatra Ina agreed that he would need to be seen Before [facility],  States he will have his office work on getting an earlier app. And contacting pt.

## 2015-05-02 NOTE — Patient Instructions (Signed)
YOU HAD AN ENDOSCOPIC PROCEDURE TODAY AT [facility]:   Refer to the procedure report that was given to you for any specific questions about what was found during the examination.  If the procedure report does not answer your questions, please call your gastroenterologist to clarify.  If you requested that your care partner not be given the details of your procedure findings, then the procedure report has been included in a sealed envelope for you to review at your convenience later.  YOU SHOULD EXPECT: Some feelings of bloating in the abdomen. Passage of more gas than usual.  Walking can help get rid of the air that was put into your GI tract during the procedure and reduce the bloating. If you had a lower endoscopy (such as a colonoscopy or flexible sigmoidoscopy) you may notice spotting of blood in your stool or on the toilet paper. If you underwent a bowel prep for your procedure, you may not have a normal bowel movement for a few days.  Please Note:  You might notice some irritation and congestion in your nose or some drainage.  This is from the oxygen used during your procedure.  There is no need for concern and it should clear up in a day or so.  SYMPTOMS TO REPORT IMMEDIATELY:    Following upper endoscopy (EGD)  Vomiting of blood or coffee ground material  New chest pain or pain under the shoulder blades  Painful or persistently difficult swallowing  New shortness of breath  Fever of 100F or higher  Black, tarry-looking stools  For urgent or emergent issues, a gastroenterologist can be reached at any hour by calling [phone].   DIET: Your first meal following the procedure should be a small meal and then it is ok to progress to your normal diet. Heavy or fried foods are harder to digest and may make you feel nauseous or bloated.  Likewise, meals heavy in dairy and vegetables can increase bloating.  Drink plenty of fluids but you should avoid alcoholic beverages  for 24 hours.  ACTIVITY:  You should plan to take it easy for the rest of today and you should NOT DRIVE or use heavy machinery until tomorrow (because of the sedation medicines used during the test).    FOLLOW UP: Our staff will call the number listed on your records the next business day following your procedure to check on you and address any questions or concerns that you may have regarding the information given to you following your procedure. If we do not reach you, we will leave a message.  However, if you are feeling well and you are not experiencing any problems, there is no need to return our call.  We will assume that you have returned to your regular daily activities without incident.  If any biopsies were taken you will be contacted by phone or by letter within the next 1-3 weeks.  Please call us at [phone] if you have not heard about the biopsies in 3 weeks.    SIGNATURES/CONFIDENTIALITY: You and/or your care partner have signed paperwork which will be entered into your electronic medical record.  These signatures attest to the fact that that the information above on your After Visit Summary has been reviewed and is understood.  Full responsibility of the confidentiality of this discharge information lies with you and/or your care-partner  Hemorrhoid informaqtion and next colonoscopy 10 years.  Office visit with Dr. Deatra Ina -  October 26, at 8:45AM

## 2015-05-03 ENCOUNTER — Telehealth: Payer: Self-pay | Admitting: *Deleted

## 2015-05-03 NOTE — Telephone Encounter (Signed)
  Follow up Call-  Call back number 05/02/2015 05/18/2014  Post procedure Call Back phone  # [phone]  Permission to leave phone message Yes Yes     Patient questions:  Do you have a fever, pain , or abdominal swelling? No. Pain Score  0 *  Have you tolerated food without any problems? Yes.    Have you been able to return to your normal activities? Yes.    Do you have any questions about your discharge instructions: Diet   No. Medications  No. Follow up visit  No.  Do you have questions or concerns about your Care? No.  Actions: * If pain score is 4 or above: No action needed, pain <4.

## 2015-05-07 ENCOUNTER — Ambulatory Visit (INDEPENDENT_AMBULATORY_CARE_PROVIDER_SITE_OTHER): Payer: BLUE CROSS/BLUE SHIELD | Admitting: Internal Medicine

## 2015-05-07 ENCOUNTER — Encounter: Payer: Self-pay | Admitting: Internal Medicine

## 2015-05-07 VITALS — BP 122/70 | HR 112 | Temp 97.7°F | Resp 16 | Ht 70.25 in | Wt 202.6 lb

## 2015-05-07 DIAGNOSIS — R05 Cough: Secondary | ICD-10-CM | POA: Diagnosis not present

## 2015-05-07 DIAGNOSIS — R059 Cough, unspecified: Secondary | ICD-10-CM

## 2015-05-07 DIAGNOSIS — R0600 Dyspnea, unspecified: Secondary | ICD-10-CM | POA: Diagnosis not present

## 2015-05-07 MED ORDER — TRAMADOL HCL 50 MG PO TABS: ORAL_TABLET | ORAL | Status: DC

## 2015-05-07 NOTE — Progress Notes (Signed)
Subjective:    Patient ID: Joel Miller, male    DOB: 1956/03/19, 59 y.o.   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 Recent thoracic & lumbar spine films to evaluate back pain were both neg. Also. CXR 1 week ago was also negative.  Patient relates his oncologist Dr Benay Spice has tentatively scheduled f/u surveillance Chest CT for Sept or Oct. Patient denies any fever, chill, night sweats. No hemoptysis. Does report dyspnea with activity and occasionally at rest.   Medication Sig  . aspirin 81 MG tablet Take 81 mg by mouth daily.  . benzonatate (TESSALON) 200 MG capsule Take 1 capsule 3 x daily to prevent cough  . Cholecalciferol (VITAMIN D PO) Take 10,000 Units by mouth daily.  . Multiple Vitamin (MULTIVITAMIN) capsule Take 1 capsule by mouth daily.  . Naproxen Sodium 220 MG CAPS Take 1 capsule by mouth daily as needed (pain).  Marland Kitchen oxazepam (SERAX) 15 MG capsule Take 1-2 capsules 1 hour prior to bedtime PRN sleep.  . traMADol (ULTRAM) 50 MG tablet Take 1 tablet 3 to 4 x day for cough & pain   Allergies  Allergen Reactions  . Ace Inhibitors     cough   Past Medical History  Diagnosis Date  . Hyperlipidemia   . Obesity     lost 80 lbs  . Hypogonadism male   . Vitamin D deficiency   . 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 Allergy     HEY FEVER  . GERD (gastroesophageal reflux disease)     pt feels it was related to cancer  . S/P radiation therapy 06/11/2014-07/18/2014  . Hypertension     no med in 72months due to weight loss  . Diabetes mellitus without complication     type II no med 60 days     Past Surgical History  Procedure Laterality Date  . Colonoscopy    . 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 Complete esophagectomy N/A 09/05/2014    Procedure: TRANSHIATAL TOTAL ESOPHAGECTOMY; Cervical esophagogastrostomy;  Surgeon: [name], MD;  Location: [facility];  Service: Thoracic;  Laterality: N/A;  . Video bronchoscopy N/A 09/05/2014    Procedure: VIDEO BRONCHOSCOPY;  Surgeon: [name], MD;  Location: [facility];  Service: Thoracic;  Laterality: N/A;  . Jejunostomy N/A 09/05/2014    Procedure: FEEDING JEJUNOSTOMY;  Surgeon: [name], MD;  Location: [facility];  Service: Thoracic;  Laterality: N/A;  . Pyloroplasty N/A 09/05/2014    Procedure: PYLOROPLASTY;  Surgeon: [name], MD;  Location: [facility];  Service: Thoracic;  Laterality: N/A;  . Wisdom tooth extraction      2010   Review of Systems  10 point systems review negative except as above.    Objective:   Physical Exam  BP 122/70 mmHg  Pulse 112  Temp(Src) 97.7 F (36.5 C)  Resp 16  Ht 5' 10.25" (1.784 m)  Wt 202 lb 9.6 oz (91.899 kg)  BMI 28.87 kg/m2  Persistent cough. O2 sat measures 92-94% on room air.  HEENT - Eac's patent. TM's Nl. EOM's  full. PERRLA. NasoOroPharynx clear. Neck - supple. Nl Thyroid. Carotids 2+ & No bruits, nodes, JVD Chest - Equal BS w/(+) Rales, rhonchi, wheezes. Cor - Nl HS. RRR w/o sig MGR. PP 1(+). No edema. Abd - No palpable organomegaly, masses or tenderness. BS nl. MS- FROM w/o deformities. Muscle power, tone and bulk Nl. Gait Nl. Neuro - No obvious Cr N abnormalities. Sensory, motor and Cerebellar functions appear Nl w/o focal abnormalities. Psyche - Mental status with flat affect.  No delusions, ideations or obvious mood abnormalities. Skin - pale.      Assessment & Plan:   1. Cough  - traMADol (ULTRAM) 50 MG tablet; Take 1 to 2 tablets 3 to 4 x day for cough & pain  Dispense: 120 tablet; Refill: 2 - CT Chest W Contrast; Future  2. Dyspnea  - CT Chest W  Contrast; Future  - Primary concern is that patient's recent onset persistent cough might likely represent a local recurrence of his esophageal cancer. Over 40 minutes of exam, counseling, chart review and high complex critical decision making was performed

## 2015-05-09 ENCOUNTER — Inpatient Hospital Stay: Admission: RE | Admit: 2015-05-09 | Payer: BLUE CROSS/BLUE SHIELD | Source: Ambulatory Visit

## 2015-05-10 ENCOUNTER — Other Ambulatory Visit (INDEPENDENT_AMBULATORY_CARE_PROVIDER_SITE_OTHER): Payer: BLUE CROSS/BLUE SHIELD

## 2015-05-10 ENCOUNTER — Ambulatory Visit (INDEPENDENT_AMBULATORY_CARE_PROVIDER_SITE_OTHER): Payer: BLUE CROSS/BLUE SHIELD | Admitting: Internal Medicine

## 2015-05-10 ENCOUNTER — Encounter: Payer: Self-pay | Admitting: Internal Medicine

## 2015-05-10 ENCOUNTER — Ambulatory Visit (HOSPITAL_COMMUNITY): Admission: RE | Admit: 2015-05-10 | Payer: BLUE CROSS/BLUE SHIELD | Source: Ambulatory Visit

## 2015-05-10 VITALS — BP 132/82 | HR 121 | Ht 70.0 in | Wt 200.0 lb

## 2015-05-10 DIAGNOSIS — R0600 Dyspnea, unspecified: Secondary | ICD-10-CM | POA: Diagnosis not present

## 2015-05-10 DIAGNOSIS — R059 Cough, unspecified: Secondary | ICD-10-CM

## 2015-05-10 DIAGNOSIS — R05 Cough: Secondary | ICD-10-CM

## 2015-05-10 MED ORDER — FAMOTIDINE 20 MG PO TABS: ORAL_TABLET | ORAL | Status: DC

## 2015-05-10 MED ORDER — PANTOPRAZOLE SODIUM 40 MG PO TBEC: 40.0000 mg | DELAYED_RELEASE_TABLET | Freq: Every day | ORAL | Status: DC

## 2015-05-10 NOTE — Patient Instructions (Signed)
Instead of naprosyn rec take tramadol 50mg  1-2 every 4 hours as needed for pain or cough   GERD (REFLUX)  is an extremely common cause of respiratory symptoms just like yours , many times with no obvious heartburn at all.    It can be treated with medication, but also with lifestyle changes including elevation of the head of your bed (ideally with 6 inch  bed blocks),  Smoking cessation, avoidance of late meals, excessive alcohol, and avoid fatty foods, chocolate, peppermint, colas, red wine, and acidic juices such as orange juice.  NO MINT OR MENTHOL PRODUCTS SO NO COUGH DROPS  USE SUGARLESS CANDY INSTEAD (Jolley ranchers or Stover's or Life Savers) or even ice chips will also do - the key is to swallow to prevent all throat clearing. NO OIL BASED VITAMINS - use powdered substitutes.  Pantoprazole (protonix) 40 mg   Take  30-60 min before first meal of the day and Pepcid (famotidine)  20 mg one @  bedtime until return to office - this is the best way to tell whether stomach acid is contributing to your problem.  Please remember to go to the lab  department downstairs for your tests - we will call you with the results when they are available.  Please see patient coordinator before you leave today  to schedule CTangiogram   Please schedule a follow up office visit in 2 weeks, sooner if needed

## 2015-05-10 NOTE — Progress Notes (Signed)
Subjective:    Patient ID: Joel Miller, male    DOB: 03/28/1956,   MRN: [id number]  HPI   77 yowm remote smoker/ mostly social smoker quit completely June 2015, then dysphagia > es ca/ RT and chemo fall 2015 esophagectomy [name] Dec 2015 with some post op cough with meals decreased to once a week and improved then worse since 1st of August esp in pm's directly related to meals referred to pulmonary clinic 05/10/2015 by Dr Lawanna Kobus.  Summary of rx 1. Adenocarcinoma the esophagus/GE junction-status post an endoscopy 05/18/2014 confirming a gastric cardia/lower esophageal mass with tumor extending proximally in the esophagus to 25 cm from the incisors  HER-2/neu amplified   Staging CT scan 05/22/2014 with indeterminate bilateral pulmonary nodules; and prominent paraesophageal, porta hepatis, and para-aortic lymph nodes   Staging PET scan 05/29/2014 confirmed hypermetabolic soft tissue thickening at the distal third of the esophagus extending into the proximal stomach. 2 additional smaller areas of hypermetabolism or noted in the more proximal esophagus with a mildly enlarged hypermetabolic gastrohepatic node.   Initiation of radiation and concurrent Taxol/carboplatin 06/11/2014; Taxol/carboplatin completed 07/09/2014; radiation completed 07/18/2014.  Esophagectomy/jejunostomy feeding tube placement 09/05/2014 with the pathology revealing [id number] tumor, HER-2/neu amplified, positive distal resection margin   Cycle 1 FOLFOX 11/01/2014  Cycle 2 FOLFOX 11/15/2014  Cycle 3 FOLFOX 11/29/2014  Cycle 4 FOLFOX 12/13/2014  Cycle 5 FOLFOX 12/27/2014  Cycle 6 FOLFOX 01/17/2015  CTs of the chest, abdomen, and pelvis on 02/01/2015-negative for recurrent esophagus cancer   05/10/2015 [facility] office visit/ Doctor Sheahan   Chief Complaint  Patient presents with  . Pulmonary Consult    Pt referred by Dr. Unk Pinto for persistant dry cough x 4-5 weeks. Pt also c/o DOE. Pt has  a history of esophogeal cancer. Pt denies CP/tightness.   abrupt change in cough more severe x 5 weeks sev times an hour worse with voice use and around 6pm, also peaks at hs then resolves s rx More sob with activity even if not coughing but no classic orthophnea/ pnd or leg swelling Cough worse on either side but better supine an able to sleep in that position ok. Taking lots of nsaids for low back pain he thinks was caused by coughing as much worse when coughs. No radicular features  No obvious other patterns in day to day or daytime variabilty or assoc chronic cough or cp or chest tightness, subjective wheeze overt sinus or hb symptoms. No unusual exp hx or h/o childhood pna/ asthma or knowledge of premature birth.  Sleeping ok without nocturnal  or early am exacerbation  of respiratory  c/o's or need for noct saba. Also denies any obvious fluctuation of symptoms with weather or environmental changes or other aggravating or alleviating factors except as outlined above   Current Medications, Allergies, Complete Past Medical History, Past Surgical History, Family History, and Social History were reviewed in [employer] record.               Review of Systems  Constitutional: Positive for appetite change. Negative for fever and unexpected weight change.  HENT: Negative for congestion, dental problem, ear pain, nosebleeds, postnasal drip, rhinorrhea, sinus pressure, sneezing, sore throat and trouble swallowing.   Eyes: Negative for redness and itching.  Respiratory: Positive for cough and shortness of breath. Negative for chest tightness and wheezing.   Cardiovascular: Negative for palpitations and leg swelling.  Gastrointestinal: Negative for nausea and vomiting.  Genitourinary: Negative for dysuria.  Musculoskeletal: Negative for joint swelling.  Skin: Negative for rash.  Neurological: Negative for headaches.  Hematological: Does not bruise/bleed easily.    Psychiatric/Behavioral: Negative for dysphoric mood. The patient is not nervous/anxious.        Objective:   Physical Exam  amb wm mildly hoarse  Wt Readings from Last 3 Encounters:  05/10/15 200 lb (90.719 kg)  05/07/15 202 lb 9.6 oz (91.899 kg)  05/02/15 208 lb (94.348 kg)    Vital signs reviewed      HEENT: nl dentition, turbinates, and orophanx. Nl external ear canals without cough reflex   NECK :  without JVD/Nodes/TM/ nl carotid upstrokes bilaterally   LUNGS: no acc muscle use, decreased bs in both bases, no rales/rubs or rhonchi or cough on insp/exp   CV:  RRR  no s3 or murmur or increase in P2, no edema   ABD:  soft and nontender with nl excursion in the supine position. No bruits or organomegaly, bowel sounds nl  MS:  warm without deformities, calf tenderness, cyanosis or clubbing  SKIN: warm and dry without lesions    NEURO:  alert, approp, no deficits       I personally reviewed images and agree with radiology impression as follows:  CXR:  04/25/15 Blunting of the costophrenic angles bilaterally may represent postsurgical thickening or small effusions.    Labs ordered  Bnp/ esr  Labs reviewed:    Chemistry      Component Value Date/Time   NA 138 04/25/2015 1211   NA 138 01/17/2015 1102   K 4.2 04/25/2015 1211   K 4.4 01/17/2015 1102   CL 103 04/25/2015 1211   CO2 25 04/25/2015 1211   CO2 26 01/17/2015 1102   BUN 13 04/25/2015 1211   BUN 19.6 01/17/2015 1102   CREATININE 0.79 04/25/2015 1211   CREATININE 0.8 01/17/2015 1102   CREATININE 0.78 10/30/2014 1156      Component Value Date/Time   CALCIUM 9.3 04/25/2015 1211   CALCIUM 9.1 01/17/2015 1102   ALKPHOS 97 04/25/2015 1211   ALKPHOS 93 01/17/2015 1102   AST 22 04/25/2015 1211   AST 34 01/17/2015 1102   ALT 10 04/25/2015 1211   ALT 24 01/17/2015 1102   BILITOT 0.4 04/25/2015 1211   BILITOT 0.43 01/17/2015 1102      Lab Results  Component Value Date   WBC 6.5 04/25/2015    HGB 13.9 04/25/2015   HCT 40.1 04/25/2015   MCV 90.5 04/25/2015   PLT 203 04/25/2015      Lab Results  Component Value Date   TSH 1.106 04/25/2015       Assessment & Plan:

## 2015-05-11 ENCOUNTER — Encounter: Payer: Self-pay | Admitting: Internal Medicine

## 2015-05-11 DIAGNOSIS — R059 Cough, unspecified: Secondary | ICD-10-CM | POA: Insufficient documentation

## 2015-05-11 DIAGNOSIS — R05 Cough: Secondary | ICD-10-CM | POA: Insufficient documentation

## 2015-05-11 LAB — BRAIN NATRIURETIC PEPTIDE: PRO B NATRI PEPTIDE: 29 pg/mL (ref 0.0–100.0)

## 2015-05-11 LAB — SEDIMENTATION RATE: SED RATE: 89 mm/h — AB (ref 0–22)

## 2015-05-11 NOTE — Assessment & Plan Note (Signed)
The most common causes of chronic cough in immunocompetent adults include the following: upper airway cough syndrome (UACS), previously referred to as postnasal drip syndrome (PNDS), which is caused by variety of rhinosinus conditions; (2) asthma; (3) GERD; (4) chronic bronchitis from cigarette smoking or other inhaled environmental irritants; (5) nonasthmatic eosinophilic bronchitis; and (6) bronchiectasis.   These conditions, singly or in combination, have accounted for up to 94% of the causes of chronic cough in prospective studies.   Other conditions have constituted no >6% of the causes in prospective studies These have included bronchogenic carcinoma, chronic interstitial pneumonia, sarcoidosis, left ventricular failure, ACEI-induced cough, and aspiration from a condition associated with pharyngeal dysfunction.    Chronic cough is often simultaneously caused by more than one condition. A single cause has been found from 38 to 82% of the time, multiple causes from 18 to 62%. Multiply caused cough has been the result of three diseases up to 42% of the time.       Based on hx and exam, this is most likely:  Classic Upper airway cough syndrome, so named because it's frequently impossible to sort out how much is  CR/sinusitis with freq throat clearing (which can be related to primary GERD)   vs  causing  secondary (" extra esophageal")  GERD from wide swings in gastric pressure that occur with throat clearing, often  promoting self use of mint and menthol lozenges that reduce the lower esophageal sphincter tone and exacerbate the problem further in a cyclical fashion.   These are the same pts (now being labeled as having "irritable larynx syndrome" by some cough centers) who not infrequently have a history of having failed to tolerate ace inhibitors,  dry powder inhalers or biphosphonates or report having atypical reflux symptoms that don't respond to standard doses of PPI , and are easily confused as  having aecopd or asthma flares by even experienced allergists/ pulmonologists.   The first step is to maximize acid suppression and eliminate cyclical coughing then regroup   I had an extended discussion with the patient reviewing all relevant studies completed to date     Each maintenance medication was reviewed in detail including most importantly the difference between maintenance and prns and under what circumstances the prns are to be triggered using an action plan format that is not reflected in the computer generated alphabetically organized AVS.    Please see instructions for details which were reviewed in writing and the patient given a copy highlighting the part that I personally wrote and discussed at today's ov.   See instructions for specific recommendations which were reviewed directly with the patient who was given a copy with highlighter outlining the key components.    Total time = 60m review case with pt/ discussion/ counseling/ giving and going over instructions (see avs)

## 2015-05-11 NOTE — Assessment & Plan Note (Signed)
Unexplained doe in pt with resting tachycardia and new bilateral pleural effusions with ddx including recurrrent ca/ chf/pericidial dz and PE > CTa needed asap > pt initially sent to [facility] radiology but due to the hour could not be precertified and was informed the only way to get this done (without paying for it himself) and have insurance pays for it was to go to ER and he refused.  Discussed in detail all the  indications, usual  risks and alternatives  relative to the benefits with patient over the phone who wants to wait until 05/14/15 to have this done  - since the likelihood of pe is relatively low (though certainly not zero) this is reasonable but advised to return to er if condition worsens at all.

## 2015-05-13 ENCOUNTER — Ambulatory Visit (HOSPITAL_COMMUNITY): Payer: BLUE CROSS/BLUE SHIELD

## 2015-05-14 ENCOUNTER — Telehealth: Payer: Self-pay | Admitting: Internal Medicine

## 2015-05-14 ENCOUNTER — Ambulatory Visit (INDEPENDENT_AMBULATORY_CARE_PROVIDER_SITE_OTHER)
Admission: RE | Admit: 2015-05-14 | Discharge: 2015-05-14 | Disposition: A | Discharge status: Home / Self Care | Payer: BLUE CROSS/BLUE SHIELD | Source: Ambulatory Visit | Attending: Internal Medicine | Admitting: Internal Medicine

## 2015-05-14 ENCOUNTER — Other Ambulatory Visit: Payer: Self-pay | Admitting: Internal Medicine

## 2015-05-14 ENCOUNTER — Other Ambulatory Visit: Payer: Self-pay | Admitting: Pulmonary Disease

## 2015-05-14 ENCOUNTER — Other Ambulatory Visit: Payer: BLUE CROSS/BLUE SHIELD

## 2015-05-14 DIAGNOSIS — R0600 Dyspnea, unspecified: Secondary | ICD-10-CM

## 2015-05-14 DIAGNOSIS — J90 Pleural effusion, not elsewhere classified: Secondary | ICD-10-CM

## 2015-05-14 MED ORDER — IOHEXOL 350 MG/ML SOLN
80.0000 mL | Freq: Once | INTRAVENOUS | Status: AC | PRN
  Administered 2015-05-14: 80 mL via INTRAVENOUS

## 2015-05-14 NOTE — Telephone Encounter (Signed)
Spoke with Stacy at [facility] CT, pt had ct angio this morning and is requesting that MW review the images before pt is let go.   MW reviewed images and spoke with pt to see how he is feeling today.   Per MW pt is cleared to leave.  Pt aware of recs.  Nothing further needed at this time.

## 2015-05-14 NOTE — Progress Notes (Signed)
Quick Note:  Pt aware ______ 

## 2015-05-15 ENCOUNTER — Ambulatory Visit (HOSPITAL_COMMUNITY)
Admission: RE | Admit: 2015-05-15 | Discharge: 2015-05-15 | Disposition: A | Discharge status: Home / Self Care | Payer: BLUE CROSS/BLUE SHIELD | Source: Ambulatory Visit | Attending: Pulmonary Disease | Admitting: Pulmonary Disease

## 2015-05-15 ENCOUNTER — Telehealth: Payer: Self-pay | Admitting: *Deleted

## 2015-05-15 ENCOUNTER — Other Ambulatory Visit (HOSPITAL_COMMUNITY)
Admission: RE | Admit: 2015-05-15 | Discharge: 2015-05-15 | Disposition: A | Discharge status: Home / Self Care | Payer: BLUE CROSS/BLUE SHIELD | Source: Ambulatory Visit | Attending: Pulmonary Disease | Admitting: Pulmonary Disease

## 2015-05-15 ENCOUNTER — Other Ambulatory Visit: Payer: Self-pay

## 2015-05-15 DIAGNOSIS — J90 Pleural effusion, not elsewhere classified: Secondary | ICD-10-CM

## 2015-05-15 DIAGNOSIS — R0600 Dyspnea, unspecified: Secondary | ICD-10-CM | POA: Diagnosis present

## 2015-05-15 DIAGNOSIS — C159 Malignant neoplasm of esophagus, unspecified: Secondary | ICD-10-CM | POA: Diagnosis not present

## 2015-05-15 DIAGNOSIS — R0602 Shortness of breath: Secondary | ICD-10-CM | POA: Insufficient documentation

## 2015-05-15 DIAGNOSIS — Z9889 Other specified postprocedural states: Secondary | ICD-10-CM

## 2015-05-15 DIAGNOSIS — R05 Cough: Secondary | ICD-10-CM | POA: Diagnosis not present

## 2015-05-15 LAB — BODY FLUID CELL COUNT WITH DIFFERENTIAL
LYMPHS FL: 69 %
Monocyte-Macrophage-Serous Fluid: 26 % — ABNORMAL LOW (ref 50–90)
NEUTROPHIL FLUID: 5 % (ref 0–25)
Total Nucleated Cell Count, Fluid: 279 cu mm (ref 0–1000)

## 2015-05-15 LAB — ALBUMIN, FLUID (OTHER): Albumin, Fluid: 2.9 g/dL

## 2015-05-15 LAB — LACTATE DEHYDROGENASE, PLEURAL OR PERITONEAL FLUID: LD, Fluid: 453 U/L — ABNORMAL HIGH (ref 3–23)

## 2015-05-15 LAB — GLUCOSE, SEROUS FLUID: Glucose, Fluid: 107 mg/dL

## 2015-05-15 LAB — PROTEIN, BODY FLUID: Total protein, fluid: 4.6 g/dL

## 2015-05-15 NOTE — Progress Notes (Signed)
Respiratory care note- Thorocentesis performed by MD.  Pt tolerated procedure well with vital signs stable through out procedure. No complications noted.  Xray pending post procedure.  Pt resting comfortably in bed.

## 2015-05-15 NOTE — Telephone Encounter (Signed)
-----   Message from [name], MD sent at 05/14/2015  9:53 PM EDT ----- Let him know I am aware of CT and will be out until 9/19 Needs to see Dr. Servando Snare ASAP for management of rt. Effusion Ok to schedule in my new patient slot 2PM 66min on 9/19 ----- Message -----    From: Tanda Rockers, MD    Sent: 05/14/2015   3:29 PM      To: [name], MD, [name], MD  Discussed with patient by phone : still comfortable at rest and supine so will arrange outpt thoracentesis asap and f/u with Dr Servando Snare and Benay Spice after that for what is likely recurrent ca with massive/ rapidly accumulating  R effusion

## 2015-05-15 NOTE — Telephone Encounter (Signed)
Made pt aware of information per Dr. Benay Spice; voices understanding of 9/19 appt, and has also made appt w/ Dr. Servando Snare for next Thursday to evaluate/tx right pleural effusion

## 2015-05-15 NOTE — Procedures (Signed)
Thoracentesis Procedure Note  Pre-operative Diagnosis: esophageal cancer, large R effusion  Post-operative Diagnosis: same  Indications: shortness of breath, massive effusion  Procedure Details  Consent: Informed consent was obtained. Risks of the procedure were discussed including: infection, bleeding, pain, pneumothorax.  Under sterile conditions the patient was positioned. Betadine solution and sterile drapes were utilized.  1% buffered lidocaine was used to anesthetize the 8th rib space. Fluid was obtained without any difficulties and minimal blood loss.  A dressing was applied to the wound and wound care instructions were provided.   Findings 1500 ml of amber pleural fluid was obtained. A sample was sent to Pathology for cytogenetics, flow, and cell counts, as well as for infection analysis.  Complications:  None; patient tolerated the procedure well.          Condition: stable  Plan A follow up chest x-ray was ordered. Bed Rest for 0 hours. Tylenol 650 mg. for pain.  Attending Attestation: I was scrubbed and helped perform the key portions of the procedure. Performed with Georgann Housekeeper ACNP  Roselie Awkward, MD [facility] Pager: [phone] Cell: [phone] After 3pm or if no response, call [phone]

## 2015-05-16 ENCOUNTER — Ambulatory Visit (HOSPITAL_COMMUNITY): Admission: RE | Admit: 2015-05-16 | Payer: BLUE CROSS/BLUE SHIELD | Source: Ambulatory Visit

## 2015-05-16 LAB — PH, BODY FLUID: PH, BODY FLUID: 8

## 2015-05-16 LAB — PATHOLOGIST SMEAR REVIEW

## 2015-05-21 ENCOUNTER — Ambulatory Visit (INDEPENDENT_AMBULATORY_CARE_PROVIDER_SITE_OTHER)
Admission: RE | Admit: 2015-05-21 | Discharge: 2015-05-21 | Disposition: A | Discharge status: Home / Self Care | Payer: BLUE CROSS/BLUE SHIELD | Source: Ambulatory Visit | Attending: Internal Medicine | Admitting: Internal Medicine

## 2015-05-21 ENCOUNTER — Other Ambulatory Visit (HOSPITAL_COMMUNITY)
Admission: RE | Admit: 2015-05-21 | Discharge: 2015-05-21 | Disposition: A | Discharge status: Home / Self Care | Payer: BLUE CROSS/BLUE SHIELD | Source: Ambulatory Visit | Attending: Internal Medicine | Admitting: Internal Medicine

## 2015-05-21 ENCOUNTER — Encounter: Payer: Self-pay | Admitting: Internal Medicine

## 2015-05-21 ENCOUNTER — Other Ambulatory Visit: Payer: BLUE CROSS/BLUE SHIELD

## 2015-05-21 ENCOUNTER — Ambulatory Visit (INDEPENDENT_AMBULATORY_CARE_PROVIDER_SITE_OTHER): Payer: BLUE CROSS/BLUE SHIELD | Admitting: Internal Medicine

## 2015-05-21 DIAGNOSIS — J90 Pleural effusion, not elsewhere classified: Secondary | ICD-10-CM

## 2015-05-21 DIAGNOSIS — J948 Other specified pleural conditions: Secondary | ICD-10-CM | POA: Diagnosis not present

## 2015-05-21 NOTE — Telephone Encounter (Signed)
Per email sent today:  Can Dr Melvyn Novas call me today [phone] regarding my condition?  I continue to have very low energy my heart is continuing to race and I would like you understand the 1500 cc of fluid removed from my chest. I do not want to wait until Friday's appointment   The Tamodol seems to be controlling the cough  Jude Etcheverry     Please advise MW thanks

## 2015-05-21 NOTE — Patient Instructions (Addendum)
Please remember to go to the   x-ray department downstairs for your tests - we will call you with the results when they are available.  Keep appt with dr Servando Snare

## 2015-05-21 NOTE — Telephone Encounter (Signed)
Spoke with the pt and he already has ov today at 4 pm so will tap him then

## 2015-05-22 ENCOUNTER — Encounter: Payer: Self-pay | Admitting: Internal Medicine

## 2015-05-22 ENCOUNTER — Other Ambulatory Visit: Payer: Self-pay | Admitting: Cardiothoracic Surgery

## 2015-05-22 DIAGNOSIS — J90 Pleural effusion, not elsewhere classified: Secondary | ICD-10-CM

## 2015-05-22 NOTE — Progress Notes (Signed)
Subjective:    Patient ID: Joel Miller, male    DOB: 1956/07/08,   MRN: [id number]    Brief patient profile:  59 yowm remote smoker/ mostly social smoker quit completely June 2015, then dysphagia > es ca/ RT and chemo fall 2015 esophagectomy [name] Dec 2015 with some post op cough with meals decreased to once a week and improved then worse since 1st of August esp in pm's directly related to meals referred to pulmonary clinic 05/10/2015 by Dr Lawanna Kobus with large R pleural effusion.  Summary of rx 1. Adenocarcinoma the esophagus/GE junction-status post an endoscopy 05/18/2014 confirming a gastric cardia/lower esophageal mass with tumor extending proximally in the esophagus to 25 cm from the incisors  HER-2/neu amplified   Staging CT scan 05/22/2014 with indeterminate bilateral pulmonary nodules; and prominent paraesophageal, porta hepatis, and para-aortic lymph nodes   Staging PET scan 05/29/2014 confirmed hypermetabolic soft tissue thickening at the distal third of the esophagus extending into the proximal stomach. 2 additional smaller areas of hypermetabolism or noted in the more proximal esophagus with a mildly enlarged hypermetabolic gastrohepatic node.   Initiation of radiation and concurrent Taxol/carboplatin 06/11/2014; Taxol/carboplatin completed 07/09/2014; radiation completed 07/18/2014.  Esophagectomy/jejunostomy feeding tube placement 09/05/2014 with the pathology revealing [id number] tumor, HER-2/neu amplified, positive distal resection margin   Cycle 1 FOLFOX 11/01/2014  Cycle 2 FOLFOX 11/15/2014  Cycle 3 FOLFOX 11/29/2014  Cycle 4 FOLFOX 12/13/2014  Cycle 5 FOLFOX 12/27/2014  Cycle 6 FOLFOX 01/17/2015  CTs of the chest, abdomen, and pelvis on 02/01/2015-negative for recurrent esophagus cancer   05/10/2015 1st [facility] office visit/ [facility]   Chief Complaint  Patient presents with  . Pulmonary Consult    Pt referred by Dr. Unk Pinto for  persistant dry cough x 4-5 weeks. Pt also c/o DOE. Pt has a history of esophogeal cancer. Pt denies CP/tightness.   abrupt change in cough more severe x 5 weeks sev times an hour worse with voice use and around 6pm, also peaks at hs then resolves s rx More sob with activity even if not coughing but no classic orthophnea/ pnd or leg swelling Cough worse on either side but better supine an able to sleep in that position ok. Taking lots of nsaids for low back pain he thinks was caused by coughing as much worse when coughs. No radicular features rec  CTa > Pos large R effusion /very dramatic change from cxr on 04/25/15  Thoracentesis 05/15/15 >  1500 cc exudate/ atypical cells but not dx   05/21/2015 f/u ov/[facility] re:  prob R malignant effusion  Chief Complaint  Patient presents with  . Follow-up    Pt states cough has improved some- tramadol helps. His breathing has only slightly improved. He still c/o lack of energy.     Comfortable at rest and able to sleep flat but not as comfortable as right p the prev t centesis and having to use tramdol up to every 3 h for relief from cough / feeling very tired but able to do adls ok  No obvious day to day or daytime variability or assoc excess/ purulent or bloody sputum or cp or chest tightness, subjective wheeze or overt sinus or hb symptoms. No unusual exp hx or h/o childhood pna/ asthma or knowledge of premature birth.  Sleeping ok without nocturnal  or early am exacerbation  of respiratory  c/o's or need for noct saba. Also denies any obvious fluctuation of symptoms with weather or environmental changes or other  aggravating or alleviating factors except as outlined above   Current Medications, Allergies, Complete Past Medical History, Past Surgical History, Family History, and Social History were reviewed in [employer] record.  ROS  The following are not active complaints unless bolded sore throat, dysphagia, dental problems,  itching, sneezing,  nasal congestion or excess/ purulent secretions, ear ache,   fever, chills, sweats, unintended wt loss, classically pleuritic or exertional cp, hemoptysis,  orthopnea pnd or leg swelling, presyncope, palpitations, abdominal pain, anorexia, nausea, vomiting, diarrhea  or change in bowel or bladder habits, change in stools or urine, dysuria,hematuria,  rash, arthralgias, visual complaints, headache, numbness, weakness or ataxia or problems with walking or coordination,  change in mood/affect or memory.            Objective:   Physical Exam  amb wm mildly hoarse  05/21/2015       186 Wt Readings from Last 3 Encounters:  05/10/15 200 lb (90.719 kg)  05/07/15 202 lb 9.6 oz (91.899 kg)  05/02/15 208 lb (94.348 kg)    Vital signs reviewed      HEENT: nl dentition, turbinates, and orophanx. Nl external ear canals without cough reflex   NECK :  without JVD/Nodes/TM/ nl carotid upstrokes bilaterally   LUNGS: no acc muscle use, decreased bs R with dullness   CV:  RRR  no s3 or murmur or increase in P2, no edema   ABD:  soft and nontender with nl excursion in the supine position. No bruits or organomegaly, bowel sounds nl  MS:  warm without deformities, calf tenderness, cyanosis or clubbing  SKIN: warm and dry without lesions    NEURO:  alert, approp, no deficits       I personally reviewed images and agree with radiology impression as follows:  CXR:  05/15/15 1. Large right pleural effusion remains. No pneumothorax. 2. Probable tiny left pleural effusion..        Labs reviewed:    Chemistry      Component Value Date/Time   NA 138 04/25/2015 1211   NA 138 01/17/2015 1102   K 4.2 04/25/2015 1211   K 4.4 01/17/2015 1102   CL 103 04/25/2015 1211   CO2 25 04/25/2015 1211   CO2 26 01/17/2015 1102   BUN 13 04/25/2015 1211   BUN 19.6 01/17/2015 1102   CREATININE 0.79 04/25/2015 1211   CREATININE 0.8 01/17/2015 1102   CREATININE 0.78 10/30/2014 1156       Component Value Date/Time   CALCIUM 9.3 04/25/2015 1211   CALCIUM 9.1 01/17/2015 1102   ALKPHOS 97 04/25/2015 1211   ALKPHOS 93 01/17/2015 1102   AST 22 04/25/2015 1211   AST 34 01/17/2015 1102   ALT 10 04/25/2015 1211   ALT 24 01/17/2015 1102   BILITOT 0.4 04/25/2015 1211   BILITOT 0.43 01/17/2015 1102      Lab Results  Component Value Date   WBC 6.5 04/25/2015   HGB 13.9 04/25/2015   HCT 40.1 04/25/2015   MCV 90.5 04/25/2015   PLT 203 04/25/2015      Lab Results  Component Value Date   TSH 1.106 04/25/2015       Assessment & Plan:

## 2015-05-22 NOTE — Assessment & Plan Note (Signed)
-   see CT chest 05/14/2015 > massive effusion with increasing adenopathy worrisome for recurrent es ca.  - Thoracentesis 05/15/15:  1500 cc exudative/ nl glucose L/M > polys,   Cytology  No dx but atypical cells present - Repeat T centesis 05/21/2015 :   1500 port wine colored fluid sent for cyt only >>>  This is almost certainly a malignant process and f/u already arranged with Dr Servando Snare for VATS vs pleurex placement but in meantime we don't have dx and he is symptomatic from a very large effusion  Discussed in detail all the  indications, usual  risks and alternatives  relative to the benefits with patient who agrees to proceed with repeat Tcentesis today  Procedure: Sitting position/ sterile prep and drape/ !% xylocaine topically/ # 18 gauge tcentesis needle R base yielded easily 1500 port wine colored fluid sent for cyt only  Pt tol well    I personally reviewed images and agree with radiology impression as follows:  CXR:  05/21/2015 p procedure: 1. Large right pleural effusion remains. No pneumothorax. 2. Probable tiny left pleural effusion.  rec await repeat cyt/ keep appt to see Dr Darnell Level later this week

## 2015-05-23 ENCOUNTER — Ambulatory Visit (INDEPENDENT_AMBULATORY_CARE_PROVIDER_SITE_OTHER): Payer: BLUE CROSS/BLUE SHIELD | Admitting: Cardiothoracic Surgery

## 2015-05-23 ENCOUNTER — Encounter: Payer: Self-pay | Admitting: Cardiothoracic Surgery

## 2015-05-23 ENCOUNTER — Other Ambulatory Visit: Payer: Self-pay | Admitting: *Deleted

## 2015-05-23 ENCOUNTER — Ambulatory Visit
Admission: RE | Admit: 2015-05-23 | Discharge: 2015-05-23 | Disposition: A | Discharge status: Home / Self Care | Payer: BLUE CROSS/BLUE SHIELD | Source: Ambulatory Visit | Attending: Cardiothoracic Surgery | Admitting: Cardiothoracic Surgery

## 2015-05-23 VITALS — BP 127/94 | HR 128 | Resp 16 | Ht 70.0 in | Wt 186.0 lb

## 2015-05-23 DIAGNOSIS — J90 Pleural effusion, not elsewhere classified: Secondary | ICD-10-CM

## 2015-05-23 DIAGNOSIS — J948 Other specified pleural conditions: Secondary | ICD-10-CM | POA: Diagnosis not present

## 2015-05-23 DIAGNOSIS — C155 Malignant neoplasm of lower third of esophagus: Secondary | ICD-10-CM | POA: Diagnosis not present

## 2015-05-23 NOTE — Progress Notes (Signed)
Pt was not feeling well enough to review allergies, meds, medical and surgical history. His wife was not able to talk with me. I just went over pre-op instructions with pt. He voiced understanding.

## 2015-05-23 NOTE — Progress Notes (Signed)
[address]       [place] [address]             [phone]      Jaques J [facility] Medical Record #[id number] Date of Birth: Jan 26, 1956  Referring: [name], MD Primary Care: [name], MD  Chief Complaint:   POST OP FOLLOW UP 09/05/2014 PREOPERATIVE DIAGNOSIS: Carcinoma of the distal third of the esophagus. 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 SURGICAL PROCEDURE: Video bronchoscopy, transhiatal total esophagectomy with cervical esophagogastrostomy, pyloroplasty, and feeding jejunostomy. SURGEON: [name], MD  Carcinoma of distal third of esophagus   Staging form: Esophagus - Adenocarcinoma, AJCC 7th Edition     Clinical: TX, N2 - Unsigned     Pathologic stage from 09/10/2014: Stage IIIB (yT3, N2, cM0, G2 - Moderately well differentiated) - Signed by [name], MD on 09/11/2014   History of Present Illness:     Since seen in June of patient has significantly deteriorated he notes over the past 2 weeks a 15 pound weight loss poor appetite lites lack of energy. He notes that he is able to swallow without difficulty but does not feel eating is worth the effort. In late August he noted increasing cough. CT scan showed significant whiteout of the right chest since last week he has had thoracentesis 2, each time 1500 ML's is removed. So far path on the first thoracentesis shows atypical cells but is not diagnostic of cancer. CT scan of the chest suggests recurrent tumor in the right pleura and mediastinal nodes.    Past Medical History  Diagnosis Date  . Hyperlipidemia   . Obesity     lost 80 lbs  . Hypogonadism male   . Vitamin D deficiency   . 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 Allergy     HEY FEVER  . GERD (gastroesophageal reflux disease)     pt feels it was related to cancer  . S/P radiation therapy 06/11/2014-07/18/2014  . Hypertension     no med in 37months  due to weight loss  . Diabetes mellitus without complication     type II no med 60 days      History  Smoking status  . Former Smoker -- 0.25 packs/day for 3 years  . Types: Cigars  Smokeless tobacco  . Never Used    Comment: no cigars for one year    History  Alcohol Use  . 1.2 oz/week  . 2 Standard drinks or equivalent per week    Comment: social     Allergies  Allergen Reactions  . Ace Inhibitors     cough    Current Outpatient Prescriptions  Medication Sig Dispense Refill  . aspirin 81 MG tablet Take 81 mg by mouth daily.    . Cholecalciferol (VITAMIN D PO) Take 10,000 Units by mouth daily.    . famotidine (PEPCID) 20 MG tablet One at bedtime 30 tablet 2  . Multiple Vitamin (MULTIVITAMIN) capsule Take 1 capsule by mouth daily.    . Naproxen Sodium 220 MG CAPS Take 1 capsule by mouth daily as needed (pain).    Marland Kitchen oxazepam (SERAX) 15 MG capsule Take 1-2 capsules 1 hour prior to bedtime PRN sleep. 60 capsule 3  . pantoprazole (PROTONIX) 40 MG tablet Take 1 tablet (40 mg total) by mouth daily. Take 30-60 min before first meal of the day 30 tablet 2  . traMADol (ULTRAM)  50 MG tablet Take 1 to 2 tablets 3 to 4 x day for cough & pain 120 tablet 2   No current facility-administered medications for this visit.   Wt Readings from Last 3 Encounters:  05/23/15 186 lb (84.369 kg)  05/21/15 186 lb 9.6 oz (84.641 kg)  05/15/15 200 lb (90.719 kg)   Physical Exam: BP 127/94 mmHg  Pulse 128  Resp 16  Ht 5\' 10"  (1.778 m)  Wt 186 lb (84.369 kg)  BMI 26.69 kg/m2  SpO2 96%  General appearance: alert, cooperative and appears stated age Neurologic: intact Heart: regular rate and rhythm, S1, S2 normal, no murmur, click, rub or gallop Lungs: clear to auscultation on the left but decreased at the right base Abdomen: soft, non-tender; bowel sounds normal; no masses,  no organomegaly and Abdominal wound is healing well Extremities: extremities normal, atraumatic, no cyanosis or edema  and Homans sign is negative, no sign of DVT Wound: Left neck incision is healing well I do not appreciate any cervical or supraclavicular adenopathy   Diagnostic Studies & Laboratory data:     Recent Radiology Findings:  Dg Chest 2 View  05/23/2015   CLINICAL DATA:  Shortness of breath, pleural effusion  EXAM: CHEST  2 VIEW  COMPARISON:  Chest x-ray of 05/21/2015  FINDINGS: There is little change in the volume of the moderate to large right pleural effusion with right basilar atelectasis. The does appear to be a small left pleural effusion as well. Heart size is stable. No bony abnormality is seen.  IMPRESSION: 1. Little change in the moderately large right pleural effusion. 2. Small left pleural effusion.   Electronically Signed   By: Ivar Drape M.D.   On: 05/23/2015 13:26   Dg Thoracic Spine W/swimmers  04/25/2015   CLINICAL DATA:  Patient with thoracolumbar spine pain. Initial encounter.  EXAM: THORACIC SPINE - 3 VIEWS  COMPARISON:  Chest radiograph 02/23/2015  FINDINGS: Multilevel degenerative disc disease of the thoracic spine. No evidence for acute thoracic spine fracture. Unchanged mild anterior height loss of multiple mid thoracic spine vertebral bodies.  IMPRESSION: No acute osseous abnormality. Stable chronic degenerative changes of the thoracic spine.   Electronically Signed   By: Lovey Newcomer M.D.   On: 04/25/2015 13:21   Dg Lumbar Spine Complete  04/25/2015   CLINICAL DATA:  Patient with thoracolumbar spine pain. Initial encounter.  EXAM: LUMBAR SPINE - COMPLETE 4+ VIEW  COMPARISON:  None.  FINDINGS: Chronic appearing fractures of the left L1, L3 and L4 transverse processes. Assimilation joint along the left aspect of the sacrum. Preservation of the vertebral body heights. No evidence for acute fracture. Multilevel degenerative disc disease with anterior endplate osteophytosis. Lower lumbar spine facet degenerative changes. Cholecystectomy clips.  IMPRESSION: No acute osseous abnormality.   Likely chronic left transverse process fractures.  Degenerative disc disease.   Electronically Signed   By: Lovey Newcomer M.D.   On: 04/25/2015 13:19   Ct Angio Chest Pe W/cm &/or Wo Cm  05/14/2015   CLINICAL DATA:  Cough and shortness of breath for 1 week. History of esophageal carcinoma diagnosed in the fall of 2015.  EXAM: CT ANGIOGRAPHY CHEST WITH CONTRAST  TECHNIQUE: Multidetector CT imaging of the chest was performed using the standard protocol during bolus administration of intravenous contrast. Multiplanar CT image reconstructions and MIPs were obtained to evaluate the vascular anatomy.  CONTRAST:  80 mL OMNIPAQUE IOHEXOL 350 MG/ML SOLN  COMPARISON:  CT chest and abdomen 01/31/2015 and PA  and lateral chest 04/25/2015.  FINDINGS: No pulmonary embolus is identified. The patient has a massive right pleural effusion which has markedly increased since the prior plain films. A smaller left pleural effusion is identified. There is no pericardial effusion. Heart size is normal.  Postoperative change of esophagectomy and gastric pull-through is again seen. There is new mediastinal lymphadenopathy. Index AP window node on image 34 measures 1.7 cm in diameter. A prevascular node on image 33 is barely perceptible on the prior examination and now measures 1.0 cm short axis dimension on image 33. There is also new and extensive juxtaphrenic lymphadenopathy best seen on the right. Index node on image 79 measures 3.1 x 2.3 cm. Pleural thickening and nodularity are identified on the right most consistent with metastatic disease.  The right lung is nearly completely collapsed secondary to compressive atelectasis from the patient's effusion. A new left lower lobe pulmonary nodule image 60 measures 0.7 cm. A second new left lower lobe pulmonary nodule on image 62 measures 0.9 cm in diameter. A left upper lobe nodule on image 39 measures 0.5 cm in diameter. Several additional smaller nodules are seen in the left lung.  Imaged  upper abdomen demonstrates a new left adrenal nodule measuring 2.9 x 1.7 cm on image 95. There is some haziness of the mesenteric about the pancreas which is also new since the prior examination. Imaged upper abdomen is otherwise unremarkable. No lytic or sclerotic bony lesions identified.  Review of the MIP images confirms the above findings.  IMPRESSION: Negative for pulmonary embolus.  Findings consistent with extensive new metastatic esophageal carcinoma with a very large pleural effusion on the right with pleural nodularity consistent with a malignant effusion. The effusion nearly completely compresses the left lung. There is also new mediastinal and juxtaphrenic lymphadenopathy, left pulmonary nodules and a left adrenal nodule all most consistent with metastatic disease.  Small left pleural effusion.  Stranding of fat about the pancreas is nonspecific and could be due to pancreatitis or mesenteritis.   Electronically Signed   By: Inge Rise M.D.   On: 05/14/2015 10:58   Ct Chest W Contrast  02/01/2015   CLINICAL DATA:  Patient with distal esophageal carcinoma diagnosed 05/18/2014. Status post chemotherapy and radiation. Patient status post esophagectomy 09/05/2014.  EXAM: CT CHEST AND ABDOMEN WITHOUT CONTRAST  TECHNIQUE: Multidetector CT imaging of the chest and abdomen was performed following the standard protocol without intravenous contrast.  COMPARISON:  CT chest 08/09/2014 ; PET-CT 05/29/2014  FINDINGS: CT CHEST FINDINGS  Mediastinum/Nodes: Right anterior chest wall Port-A-Cath is present with tip terminating at the superior cavoatrial junction. No enlarged axillary, mediastinal or hilar lymphadenopathy. Normal heart size. No pericardial effusion. Postoperative changes compatible with esophagectomy and gastric pull-through. No mediastinal fluid collections are identified.  Lungs/Pleura: Central airways are patent. Minimal scarring and or atelectasis medial right lower lobe (image 38; series 4).  No large nodular or consolidative pulmonary opacity. Minimal tiny 1 mm nodules within the peripheral lungs bilaterally are unchanged from prior. No pleural effusion or pneumothorax.  Musculoskeletal: Thoracic spine degenerative changes without aggressive or acute appearing osseous lesions.  CT ABDOMEN FINDINGS  Hepatobiliary: Liver is normal in size and contour without focal hepatic lesion identified. Gallbladder is unremarkable. No intrahepatic or extrahepatic biliary ductal dilatation.  Pancreas: Unremarkable  Spleen: Unremarkable  Adrenals/Urinary Tract: Stable thickening of the bilateral adrenal glands. Kidneys enhance symmetrically with contrast. No hydronephrosis.  Stomach/Bowel: Postoperative changes compatible with esophagectomy and gastric pull-through. No evidence for abnormal bowel  wall thickening or bowel obstruction. There is nonspecific mesenteric fat stranding within the visualized upper abdomen. No free intraperitoneal air or fluid.  Vascular/Lymphatic: Normal caliber abdominal aorta. No retroperitoneal lymphadenopathy.  Other: Postsurgical changes midline abdominal soft tissues.  Musculoskeletal: Lower lumbar spine degenerative changes. No aggressive or acute appearing osseous lesions.  IMPRESSION: Patient status post interval esophagectomy and pull-through without evidence for locally recurrent or distant metastatic disease.  Nonspecific mesenteric fat stranding within the visualized upper abdomen is favored to be postoperative in etiology.   Electronically Signed   By: Lovey Newcomer M.D.   On: 02/01/2015 08:34       Recent Lab Findings: Lab Results  Component Value Date   WBC 6.5 04/25/2015   HGB 13.9 04/25/2015   HCT 40.1 04/25/2015   PLT 203 04/25/2015   GLUCOSE 121* 04/25/2015   CHOL 134 04/25/2015   TRIG 72 04/25/2015   HDL 41 04/25/2015   LDLCALC 79 04/25/2015   ALT 10 04/25/2015   AST 22 04/25/2015   NA 138 04/25/2015   K 4.2 04/25/2015   CL 103 04/25/2015   CREATININE  0.79 04/25/2015   BUN 13 04/25/2015   CO2 25 04/25/2015   TSH 1.106 04/25/2015   INR 1.16 02/27/2015   HGBA1C 5.9* 04/25/2015      Assessment / Plan:      Adenocarcinoma the esophagus/GE junction-status post an transhiatal total esophagectomy for a stage IIIB adenocarcinoma, now with what appears to be a malignant right pleural effusion and CT evidence of more diffuse disease.  To control the patient's effusion of recommended that we proceed urgently with placement of a right Pleurx catheter. The pros and cons of this were discussed with patient in detail and he is willing to proceed. We'll make plans for this tomorrow.                   [name] MD      [address] [place] [address] Office [phone]   Beeper [phone]  05/23/2015 4:32 PM

## 2015-05-24 ENCOUNTER — Ambulatory Visit (HOSPITAL_COMMUNITY): Payer: BLUE CROSS/BLUE SHIELD

## 2015-05-24 ENCOUNTER — Encounter (HOSPITAL_COMMUNITY)
Admission: RE | Disposition: A | Discharge status: Home / Self Care | Payer: Self-pay | Source: Ambulatory Visit | Attending: Cardiothoracic Surgery

## 2015-05-24 ENCOUNTER — Other Ambulatory Visit: Payer: Self-pay | Admitting: Internal Medicine

## 2015-05-24 ENCOUNTER — Encounter (HOSPITAL_COMMUNITY): Payer: Self-pay | Admitting: *Deleted

## 2015-05-24 ENCOUNTER — Ambulatory Visit (HOSPITAL_COMMUNITY)
Admission: RE | Admit: 2015-05-24 | Discharge: 2015-05-24 | Disposition: A | Discharge status: Home / Self Care | Payer: BLUE CROSS/BLUE SHIELD | Source: Ambulatory Visit | Attending: Cardiothoracic Surgery | Admitting: Cardiothoracic Surgery

## 2015-05-24 ENCOUNTER — Ambulatory Visit (HOSPITAL_COMMUNITY): Payer: BLUE CROSS/BLUE SHIELD | Admitting: Anesthesiology

## 2015-05-24 ENCOUNTER — Ambulatory Visit: Payer: BLUE CROSS/BLUE SHIELD | Admitting: Internal Medicine

## 2015-05-24 DIAGNOSIS — E785 Hyperlipidemia, unspecified: Secondary | ICD-10-CM | POA: Insufficient documentation

## 2015-05-24 DIAGNOSIS — E559 Vitamin D deficiency, unspecified: Secondary | ICD-10-CM | POA: Diagnosis not present

## 2015-05-24 DIAGNOSIS — E119 Type 2 diabetes mellitus without complications: Secondary | ICD-10-CM | POA: Diagnosis not present

## 2015-05-24 DIAGNOSIS — I10 Essential (primary) hypertension: Secondary | ICD-10-CM | POA: Insufficient documentation

## 2015-05-24 DIAGNOSIS — K219 Gastro-esophageal reflux disease without esophagitis: Secondary | ICD-10-CM | POA: Diagnosis not present

## 2015-05-24 DIAGNOSIS — C155 Malignant neoplasm of lower third of esophagus: Secondary | ICD-10-CM | POA: Insufficient documentation

## 2015-05-24 DIAGNOSIS — J90 Pleural effusion, not elsewhere classified: Secondary | ICD-10-CM | POA: Diagnosis not present

## 2015-05-24 DIAGNOSIS — Z9049 Acquired absence of other specified parts of digestive tract: Secondary | ICD-10-CM | POA: Diagnosis not present

## 2015-05-24 DIAGNOSIS — Z87891 Personal history of nicotine dependence: Secondary | ICD-10-CM | POA: Diagnosis not present

## 2015-05-24 HISTORY — DX: Pleural effusion, not elsewhere classified: J90

## 2015-05-24 HISTORY — PX: CHEST TUBE INSERTION: SHX231

## 2015-05-24 LAB — SURGICAL PCR SCREEN
MRSA, PCR: NEGATIVE
Staphylococcus aureus: NEGATIVE

## 2015-05-24 LAB — COMPREHENSIVE METABOLIC PANEL
ALT: 19 U/L (ref 17–63)
AST: 40 U/L (ref 15–41)
Albumin: 2.9 g/dL — ABNORMAL LOW (ref 3.5–5.0)
Alkaline Phosphatase: 98 U/L (ref 38–126)
Anion gap: 13 (ref 5–15)
BUN: 16 mg/dL (ref 6–20)
CO2: 26 mmol/L (ref 22–32)
Calcium: 9.2 mg/dL (ref 8.9–10.3)
Chloride: 95 mmol/L — ABNORMAL LOW (ref 101–111)
Creatinine, Ser: 1.04 mg/dL (ref 0.61–1.24)
GFR calc Af Amer: >60 mL/min (ref >60)
GFR calc non Af Amer: >60 mL/min (ref >60)
Glucose, Bld: 116 mg/dL — ABNORMAL HIGH (ref 65–99)
Potassium: 5.3 mmol/L — ABNORMAL HIGH (ref 3.5–5.1)
Sodium: 134 mmol/L — ABNORMAL LOW (ref 135–145)
Total Bilirubin: 1.1 mg/dL (ref 0.3–1.2)
Total Protein: 6.8 g/dL (ref 6.5–8.1)

## 2015-05-24 LAB — PROTIME-INR
INR: 1.3 (ref 0.00–1.49)
Prothrombin Time: 16.3 seconds — ABNORMAL HIGH (ref 11.6–15.2)

## 2015-05-24 LAB — CBC
HCT: 46.2 % (ref 39.0–52.0)
Hemoglobin: 15.6 g/dL (ref 13.0–17.0)
MCH: 30.1 pg (ref 26.0–34.0)
MCHC: 33.8 g/dL (ref 30.0–36.0)
MCV: 89 fL (ref 78.0–100.0)
Platelets: 289 10*3/uL (ref 150–400)
RBC: 5.19 MIL/uL (ref 4.22–5.81)
RDW: 12.7 % (ref 11.5–15.5)
WBC: 10.8 10*3/uL — ABNORMAL HIGH (ref 4.0–10.5)

## 2015-05-24 LAB — APTT: aPTT: 29 seconds (ref 24–37)

## 2015-05-24 SURGERY — INSERTION, PLEURAL DRAINAGE CATHETER
Anesthesia: Monitor Anesthesia Care | Laterality: Right

## 2015-05-24 MED ORDER — SODIUM CHLORIDE 0.9 % IR SOLN
Status: DC | PRN
  Administered 2015-05-24: 1000 mL

## 2015-05-24 MED ORDER — DEXTROSE 5 % IV SOLN
INTRAVENOUS | Status: AC
  Filled 2015-05-24: qty 1.5

## 2015-05-24 MED ORDER — FENTANYL CITRATE (PF) 250 MCG/5ML IJ SOLN
INTRAMUSCULAR | Status: AC
  Filled 2015-05-24: qty 5

## 2015-05-24 MED ORDER — MEPERIDINE HCL 25 MG/ML IJ SOLN: 6.2500 mg | INTRAMUSCULAR | Status: DC | PRN

## 2015-05-24 MED ORDER — HYDROMORPHONE HCL 1 MG/ML IJ SOLN: 0.2500 mg | INTRAMUSCULAR | Status: DC | PRN

## 2015-05-24 MED ORDER — PROMETHAZINE HCL 25 MG/ML IJ SOLN: 6.2500 mg | INTRAMUSCULAR | Status: DC | PRN

## 2015-05-24 MED ORDER — PROPOFOL INFUSION 10 MG/ML OPTIME
INTRAVENOUS | Status: DC | PRN
  Administered 2015-05-24: 50 ug/kg/min via INTRAVENOUS

## 2015-05-24 MED ORDER — LACTATED RINGERS IV SOLN
INTRAVENOUS | Status: DC
  Administered 2015-05-24 (×2): via INTRAVENOUS

## 2015-05-24 MED ORDER — PHENYLEPHRINE HCL 10 MG/ML IJ SOLN
INTRAMUSCULAR | Status: DC | PRN
  Administered 2015-05-24: 40 ug via INTRAVENOUS
  Administered 2015-05-24: 80 ug via INTRAVENOUS

## 2015-05-24 MED ORDER — FENTANYL CITRATE (PF) 100 MCG/2ML IJ SOLN
INTRAMUSCULAR | Status: DC | PRN
  Administered 2015-05-24 (×2): 25 ug via INTRAVENOUS
  Administered 2015-05-24: 50 ug via INTRAVENOUS

## 2015-05-24 MED ORDER — MIDAZOLAM HCL 2 MG/2ML IJ SOLN
INTRAMUSCULAR | Status: AC
  Filled 2015-05-24: qty 4

## 2015-05-24 MED ORDER — MUPIROCIN 2 % EX OINT
TOPICAL_OINTMENT | CUTANEOUS | Status: AC
  Administered 2015-05-24: 13:00:00
  Filled 2015-05-24: qty 22

## 2015-05-24 MED ORDER — MUPIROCIN 2 % EX OINT: 1.0000 "application " | TOPICAL_OINTMENT | Freq: Once | CUTANEOUS | Status: DC

## 2015-05-24 MED ORDER — LIDOCAINE 1 % OPTIME INJ - NO CHARGE
INTRAMUSCULAR | Status: DC | PRN
  Administered 2015-05-24: 9 mL via INTRADERMAL

## 2015-05-24 MED ORDER — DEXTROSE 5 % IV SOLN
1.5000 g | INTRAVENOUS | Status: AC
  Administered 2015-05-24: 1.5 g via INTRAVENOUS

## 2015-05-24 MED ORDER — MIDAZOLAM HCL 5 MG/5ML IJ SOLN
INTRAMUSCULAR | Status: DC | PRN
  Administered 2015-05-24: 2 mg via INTRAVENOUS

## 2015-05-24 SURGICAL SUPPLY — 30 items
ADH SKN CLS APL DERMABOND .7 (GAUZE/BANDAGES/DRESSINGS) ×1
BRUSH SCRUB EZ PLAIN DRY (MISCELLANEOUS) ×4 IMPLANT
CANISTER SUCTION 2500CC (MISCELLANEOUS) ×2 IMPLANT
COVER PROBE W GEL 5X96 (DRAPES) ×1 IMPLANT
COVER SURGICAL LIGHT HANDLE (MISCELLANEOUS) ×2 IMPLANT
COVER TRANSDUCER ULTRASND GEL (DRAPE) ×2 IMPLANT
DERMABOND ADVANCED (GAUZE/BANDAGES/DRESSINGS) ×1
DERMABOND ADVANCED .7 DNX12 (GAUZE/BANDAGES/DRESSINGS) ×1 IMPLANT
DRAPE C-ARM 42X72 X-RAY (DRAPES) ×2 IMPLANT
DRAPE LAPAROSCOPIC ABDOMINAL (DRAPES) ×2 IMPLANT
GLOVE BIO SURGEON STRL SZ 6.5 (GLOVE) ×5 IMPLANT
GLOVE BIOGEL PI IND STRL 6.5 (GLOVE) IMPLANT
GLOVE BIOGEL PI INDICATOR 6.5 (GLOVE) ×1
GOWN STRL REUS W/ TWL LRG LVL3 (GOWN DISPOSABLE) ×2 IMPLANT
GOWN STRL REUS W/TWL LRG LVL3 (GOWN DISPOSABLE) ×6
KIT BASIN OR (CUSTOM PROCEDURE TRAY) ×2 IMPLANT
KIT PLEURX DRAIN CATH 1000ML (MISCELLANEOUS) ×2 IMPLANT
KIT PLEURX DRAIN CATH 15.5FR (DRAIN) ×2 IMPLANT
KIT ROOM TURNOVER OR (KITS) ×2 IMPLANT
NS IRRIG 1000ML POUR BTL (IV SOLUTION) ×2 IMPLANT
PACK GENERAL/GYN (CUSTOM PROCEDURE TRAY) ×2 IMPLANT
PAD ARMBOARD 7.5X6 YLW CONV (MISCELLANEOUS) ×4 IMPLANT
SET DRAINAGE LINE (MISCELLANEOUS) IMPLANT
SPONGE GAUZE 4X4 12PLY STER LF (GAUZE/BANDAGES/DRESSINGS) ×1 IMPLANT
SUT ETHILON 3 0 FSL (SUTURE) ×2 IMPLANT
SUT VIC AB 3-0 X1 27 (SUTURE) ×2 IMPLANT
TOWEL OR 17X24 6PK STRL BLUE (TOWEL DISPOSABLE) ×2 IMPLANT
TOWEL OR 17X26 10 PK STRL BLUE (TOWEL DISPOSABLE) ×2 IMPLANT
VALVE REPLACEMENT CAP (MISCELLANEOUS) IMPLANT
WATER STERILE IRR 1000ML POUR (IV SOLUTION) ×2 IMPLANT

## 2015-05-24 NOTE — Anesthesia Procedure Notes (Addendum)
Procedure Name: MAC Date/Time: 05/24/2015 3:00 PM Performed by: Lavell Luster Pre-anesthesia Checklist: Patient identified, Emergency Drugs available, Suction available, Patient being monitored and Timeout performed Patient Re-evaluated:Patient Re-evaluated prior to inductionOxygen Delivery Method: Simple face mask Intubation Type: IV induction Placement Confirmation: positive ETCO2 and breath sounds checked- equal and bilateral Dental Injury: Teeth and Oropharynx as per pre-operative assessment

## 2015-05-24 NOTE — H&P (Signed)
[address]       [place] [address]             [phone]      Joel Miller Medical Record #[id number] Date of Birth: 03-18-1956  Referring: No ref. 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 SURGICAL PROCEDURE: Video bronchoscopy, transhiatal total esophagectomy with cervical esophagogastrostomy, pyloroplasty, and feeding jejunostomy. SURGEON: [name], MD  Carcinoma of distal third of esophagus   Staging form: Esophagus - Adenocarcinoma, AJCC 7th Edition     Clinical: TX, N2 - Unsigned     Pathologic stage from 09/10/2014: Stage IIIB (yT3, N2, cM0, G2 - Moderately well differentiated) - Signed by [name], MD on 09/11/2014   History of Present Illness:     Since seen in June of patient has significantly deteriorated he notes over the past 2 weeks a 15 pound weight loss poor appetite lites lack of energy. He notes that he is able to swallow without difficulty but does not feel eating is worth the effort. In late August he noted increasing cough. CT scan showed significant whiteout of the right chest since last week he has had thoracentesis 2, each time 1500 ML's is removed. So far path on the first thoracentesis shows atypical cells but is not diagnostic of cancer. CT scan of the chest suggests recurrent tumor in the right pleura and mediastinal nodes.    Past Medical History  Diagnosis Date  . Hyperlipidemia   . Obesity     lost 80 lbs  . Hypogonadism male   . Vitamin D deficiency   . 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 Allergy     HEY FEVER  . GERD (gastroesophageal reflux disease)     pt feels it was related to cancer  . S/P radiation therapy 06/11/2014-07/18/2014  . Hypertension     no med in  56months due to weight loss  . Diabetes mellitus without complication     type II no med 60 days   . Pleural effusion      History  Smoking status  . Former Smoker -- 0.25 packs/day for 3 years  . Types: Cigars  Smokeless tobacco  . Never Used    Comment: no cigars for one year    History  Alcohol Use  . 1.2 oz/week  . 2 Standard drinks or equivalent per week    Comment: social     Allergies  Allergen Reactions  . Ace Inhibitors     cough    Current Facility-Administered Medications  Medication Dose Route Frequency Provider Last Rate Last Dose  . cefUROXime (ZINACEF) 1.5 g in dextrose 5 % 50 mL IVPB  1.5 g Intravenous 60 min Pre-Op [name], MD      . dextrose 5 % with cefUROXime (ZINACEF) ADS Med           . lactated ringers infusion   Intravenous Continuous [name], MD 50 mL/hr at 05/24/15 1242    . mupirocin ointment (BACTROBAN) 2 % 1 application  1 application Topical Once [name], MD       Wt Readings from Last 3 Encounters:  05/24/15 186 lb (84.369 kg)  05/23/15 186 lb (84.369 kg)  05/21/15 186 lb 9.6 oz (84.641 kg)  Physical Exam: BP 156/101 mmHg  Pulse 108  Temp(Src) 97.7 F (36.5 C) (Oral)  Resp 18  Ht 5\' 10"  (1.778 m)  Wt 186 lb (84.369 kg)  BMI 26.69 kg/m2  SpO2 98%  General appearance: alert, cooperative and appears stated age Neurologic: intact Heart: regular rate and rhythm, S1, S2 normal, no murmur, click, rub or gallop Lungs: clear to auscultation on the left but decreased at the right base Abdomen: soft, non-tender; bowel sounds normal; no masses,  no organomegaly and Abdominal wound is healing well Extremities: extremities normal, atraumatic, no cyanosis or edema and Homans sign is negative, no sign of DVT Wound: Left neck incision is healing well I do not appreciate any cervical or supraclavicular adenopathy   Diagnostic Studies & Laboratory data:     Recent Radiology Findings:  Dg Chest 2  View  05/23/2015   CLINICAL DATA:  Shortness of breath, pleural effusion  EXAM: CHEST  2 VIEW  COMPARISON:  Chest x-ray of 05/21/2015  FINDINGS: There is little change in the volume of the moderate to large right pleural effusion with right basilar atelectasis. The does appear to be a small left pleural effusion as well. Heart size is stable. No bony abnormality is seen.  IMPRESSION: 1. Little change in the moderately large right pleural effusion. 2. Small left pleural effusion.   Electronically Signed   By: Ivar Drape M.D.   On: 05/23/2015 13:26   Dg Thoracic Spine W/swimmers  04/25/2015   CLINICAL DATA:  Patient with thoracolumbar spine pain. Initial encounter.  EXAM: THORACIC SPINE - 3 VIEWS  COMPARISON:  Chest radiograph 02/23/2015  FINDINGS: Multilevel degenerative disc disease of the thoracic spine. No evidence for acute thoracic spine fracture. Unchanged mild anterior height loss of multiple mid thoracic spine vertebral bodies.  IMPRESSION: No acute osseous abnormality. Stable chronic degenerative changes of the thoracic spine.   Electronically Signed   By: Lovey Newcomer M.D.   On: 04/25/2015 13:21   Dg Lumbar Spine Complete  04/25/2015   CLINICAL DATA:  Patient with thoracolumbar spine pain. Initial encounter.  EXAM: LUMBAR SPINE - COMPLETE 4+ VIEW  COMPARISON:  None.  FINDINGS: Chronic appearing fractures of the left L1, L3 and L4 transverse processes. Assimilation joint along the left aspect of the sacrum. Preservation of the vertebral body heights. No evidence for acute fracture. Multilevel degenerative disc disease with anterior endplate osteophytosis. Lower lumbar spine facet degenerative changes. Cholecystectomy clips.  IMPRESSION: No acute osseous abnormality.  Likely chronic left transverse process fractures.  Degenerative disc disease.   Electronically Signed   By: Lovey Newcomer M.D.   On: 04/25/2015 13:19   Ct Angio Chest Pe W/cm &/or Wo Cm  05/14/2015   CLINICAL DATA:  Cough and shortness of  breath for 1 week. History of esophageal carcinoma diagnosed in the fall of 2015.  EXAM: CT ANGIOGRAPHY CHEST WITH CONTRAST  TECHNIQUE: Multidetector CT imaging of the chest was performed using the standard protocol during bolus administration of intravenous contrast. Multiplanar CT image reconstructions and MIPs were obtained to evaluate the vascular anatomy.  CONTRAST:  80 mL OMNIPAQUE IOHEXOL 350 MG/ML SOLN  COMPARISON:  CT chest and abdomen 01/31/2015 and PA and lateral chest 04/25/2015.  FINDINGS: No pulmonary embolus is identified. The patient has a massive right pleural effusion which has markedly increased since the prior plain films. A smaller left pleural effusion is identified. There is no pericardial effusion. Heart size is normal.  Postoperative change of esophagectomy and gastric pull-through  is again seen. There is new mediastinal lymphadenopathy. Index AP window node on image 34 measures 1.7 cm in diameter. A prevascular node on image 33 is barely perceptible on the prior examination and now measures 1.0 cm short axis dimension on image 33. There is also new and extensive juxtaphrenic lymphadenopathy best seen on the right. Index node on image 79 measures 3.1 x 2.3 cm. Pleural thickening and nodularity are identified on the right most consistent with metastatic disease.  The right lung is nearly completely collapsed secondary to compressive atelectasis from the patient's effusion. A new left lower lobe pulmonary nodule image 60 measures 0.7 cm. A second new left lower lobe pulmonary nodule on image 62 measures 0.9 cm in diameter. A left upper lobe nodule on image 39 measures 0.5 cm in diameter. Several additional smaller nodules are seen in the left lung.  Imaged upper abdomen demonstrates a new left adrenal nodule measuring 2.9 x 1.7 cm on image 95. There is some haziness of the mesenteric about the pancreas which is also new since the prior examination. Imaged upper abdomen is otherwise  unremarkable. No lytic or sclerotic bony lesions identified.  Review of the MIP images confirms the above findings.  IMPRESSION: Negative for pulmonary embolus.  Findings consistent with extensive new metastatic esophageal carcinoma with a very large pleural effusion on the right with pleural nodularity consistent with a malignant effusion. The effusion nearly completely compresses the left lung. There is also new mediastinal and juxtaphrenic lymphadenopathy, left pulmonary nodules and a left adrenal nodule all most consistent with metastatic disease.  Small left pleural effusion.  Stranding of fat about the pancreas is nonspecific and could be due to pancreatitis or mesenteritis.   Electronically Signed   By: Inge Rise M.D.   On: 05/14/2015 10:58   Ct Chest W Contrast  02/01/2015   CLINICAL DATA:  Patient with distal esophageal carcinoma diagnosed 05/18/2014. Status post chemotherapy and radiation. Patient status post esophagectomy 09/05/2014.  EXAM: CT CHEST AND ABDOMEN WITHOUT CONTRAST  TECHNIQUE: Multidetector CT imaging of the chest and abdomen was performed following the standard protocol without intravenous contrast.  COMPARISON:  CT chest 08/09/2014 ; PET-CT 05/29/2014  FINDINGS: CT CHEST FINDINGS  Mediastinum/Nodes: Right anterior chest wall Port-A-Cath is present with tip terminating at the superior cavoatrial junction. No enlarged axillary, mediastinal or hilar lymphadenopathy. Normal heart size. No pericardial effusion. Postoperative changes compatible with esophagectomy and gastric pull-through. No mediastinal fluid collections are identified.  Lungs/Pleura: Central airways are patent. Minimal scarring and or atelectasis medial right lower lobe (image 38; series 4). No large nodular or consolidative pulmonary opacity. Minimal tiny 1 mm nodules within the peripheral lungs bilaterally are unchanged from prior. No pleural effusion or pneumothorax.  Musculoskeletal: Thoracic spine degenerative  changes without aggressive or acute appearing osseous lesions.  CT ABDOMEN FINDINGS  Hepatobiliary: Liver is normal in size and contour without focal hepatic lesion identified. Gallbladder is unremarkable. No intrahepatic or extrahepatic biliary ductal dilatation.  Pancreas: Unremarkable  Spleen: Unremarkable  Adrenals/Urinary Tract: Stable thickening of the bilateral adrenal glands. Kidneys enhance symmetrically with contrast. No hydronephrosis.  Stomach/Bowel: Postoperative changes compatible with esophagectomy and gastric pull-through. No evidence for abnormal bowel wall thickening or bowel obstruction. There is nonspecific mesenteric fat stranding within the visualized upper abdomen. No free intraperitoneal air or fluid.  Vascular/Lymphatic: Normal caliber abdominal aorta. No retroperitoneal lymphadenopathy.  Other: Postsurgical changes midline abdominal soft tissues.  Musculoskeletal: Lower lumbar spine degenerative changes. No aggressive or acute appearing osseous  lesions.  IMPRESSION: Patient status post interval esophagectomy and pull-through without evidence for locally recurrent or distant metastatic disease.  Nonspecific mesenteric fat stranding within the visualized upper abdomen is favored to be postoperative in etiology.   Electronically Signed   By: Lovey Newcomer M.D.   On: 02/01/2015 08:34       Recent Lab Findings: Lab Results  Component Value Date   WBC 10.8* 05/24/2015   HGB 15.6 05/24/2015   HCT 46.2 05/24/2015   PLT 289 05/24/2015   GLUCOSE 116* 05/24/2015   CHOL 134 04/25/2015   TRIG 72 04/25/2015   HDL 41 04/25/2015   LDLCALC 79 04/25/2015   ALT 19 05/24/2015   AST 40 05/24/2015   NA 134* 05/24/2015   K 5.3* 05/24/2015   CL 95* 05/24/2015   CREATININE 1.04 05/24/2015   BUN 16 05/24/2015   CO2 26 05/24/2015   TSH 1.106 04/25/2015   INR 1.30 05/24/2015   HGBA1C 5.9* 04/25/2015      Assessment / Plan:      Adenocarcinoma the esophagus/GE junction-status post an  transhiatal total esophagectomy for a stage IIIB adenocarcinoma, now with what appears to be a malignant right pleural effusion and CT evidence of more diffuse disease.  To control the patient's effusion of recommended that we proceed urgently with placement of a right Pleurx catheter. The pros and cons of this were discussed with patient in detail and he is willing to proceed.  The goals risks and alternatives of the planned surgical procedure right pleurix catheter   have been discussed with the patient in detail. The risks of the procedure including death, infection, stroke, myocardial infarction, bleeding, blood transfusion have all been discussed specifically.  I have quoted Tana Conch Weinkauf a 1% of perioperative mortality and a complication rate as high as 15 %. The patient's questions have been answered.Lem J Bounds is willing  to proceed with the planned procedure.                  [name] MD      [address] [place] [address] Office [phone]   Beeper [phone]  05/24/2015 1:57 PM

## 2015-05-24 NOTE — Care Management Note (Signed)
Case Management Note  Patient Details  Name: Joel Miller MRN: [id number] Date of Birth: 1956/02/04  Subjective/Objective:  CM consult for [facility] to drain pleurx catheter.                  Action/Plan: [name] requested [facility]. They are unable to accept referral, as was [place]. I advised [name] that I was working to get a [employer] who would be able to manage the complexity of the service (drain daily x7 days, the qod) and the payer. [facility] was his "fourth choice".   Expected Discharge Date:   9.16.2016 Expected Discharge Plan:  [place]       Discharge planning Services  CM Consult  Post Acute Care Choice:  Home Health Choice offered to:  Patient     HH Arranged:  RN [facility] Agency:  [facility] Santiago Glad [phone]. Forms faxed to [facility] with confirmation received.)  Status of Service:  Completed, signed off    Lewie Chamber, RN 05/24/2015, 5:22 PM

## 2015-05-24 NOTE — Transfer of Care (Signed)
Immediate Anesthesia Transfer of Care Note  Patient: Joel Miller  Procedure(s) Performed: Procedure(s): INSERTION PLEURAL DRAINAGE CATHETER (Right)  Patient Location: PACU  Anesthesia Type:MAC  Level of Consciousness: awake, alert  and oriented  Airway & Oxygen Therapy: Patient connected to face mask oxygen  Post-op Assessment: Report given to RN  Post vital signs: stable  Last Vitals:  Filed Vitals:   05/24/15 1217  BP: 156/101  Pulse: 108  Temp: 36.5 C  Resp: 18    Complications: No apparent anesthesia complications

## 2015-05-24 NOTE — Discharge Instructions (Signed)
A referral has been made to Santiago Glad, Therapist, sports, from [facility]. I tried [facility] and [place] who were all unable to service the referral. Thank you for choosing [facility] for you services today! Deveron Furlong RN BSN ACM [phone].

## 2015-05-24 NOTE — Anesthesia Preprocedure Evaluation (Addendum)
Anesthesia Evaluation  Patient identified by MRN, date of birth, ID band Patient awake    Reviewed: Allergy & Precautions, H&P , NPO status , Patient's Chart, lab work & pertinent test results  Airway Mallampati: II  TM Distance: >3 FB Neck ROM: Full    Dental no notable dental hx. (+) Teeth Intact, Dental Advisory Given   Pulmonary Current Smoker, former smoker,    Pulmonary exam normal breath sounds clear to auscultation       Cardiovascular hypertension, Pt. on medications  Rhythm:Regular Rate:Normal     Neuro/Psych negative neurological ROS  negative psych ROS   GI/Hepatic Neg liver ROS, GERD  ,Esophageal CA   Endo/Other  diabetes  Renal/GU negative Renal ROS  negative genitourinary   Musculoskeletal   Abdominal   Peds  Hematology negative hematology ROS (+)   Anesthesia Other Findings   Reproductive/Obstetrics negative OB ROS                            Anesthesia Physical  Anesthesia Plan  ASA: III  Anesthesia Plan: MAC   Post-op Pain Management:    Induction: Intravenous  Airway Management Planned: Simple Face Mask  Additional Equipment:   Intra-op Plan:   Post-operative Plan:   Informed Consent: I have reviewed the patients History and Physical, chart, labs and discussed the procedure including the risks, benefits and alternatives for the proposed anesthesia with the patient or authorized representative who has indicated his/her understanding and acceptance.   Dental advisory given  Plan Discussed with: CRNA  Anesthesia Plan Comments:         Anesthesia Quick Evaluation

## 2015-05-24 NOTE — Brief Op Note (Signed)
      [address]       [place] [address]             [phone]      05/24/2015  3:34 PM  PATIENT:  Joel Miller  59 y.o. male  PRE-OPERATIVE DIAGNOSIS:  LARGE RIGHT PLEURAL EFFUSION  POST-OPERATIVE DIAGNOSIS:  LARGE RIGHT PLEURAL EFFUSION  PROCEDURE:  Procedure(s): INSERTION PLEURAL DRAINAGE CATHETER (Right)  SURGEON:  Surgeon(s) and Role:    * [name], MD - Primary   ANESTHESIA:   MAC  EBL:  Total I/O In: -  Out: 2000 [Other:2000]  BLOOD ADMINISTERED:none  DRAINS: pleurix   LOCAL MEDICATIONS USED:  LIDOCAINE  and Amount: 8 ml  SPECIMEN:  Source of Specimen:  right pleural fluid for cytology  DISPOSITION OF SPECIMEN:  PATHOLOGY  COUNTS:  YES  DICTATION: .Dragon Dictation  PLAN OF CARE: Discharge to home after PACU  PATIENT DISPOSITION:  PACU - hemodynamically stable.   Delay start of Pharmacological VTE agent (>24hrs) due to surgical blood loss or risk of bleeding: yes

## 2015-05-24 NOTE — Anesthesia Postprocedure Evaluation (Signed)
  Anesthesia Post-op Note  Patient: Joel Miller  Procedure(s) Performed: Procedure(s) (LRB): INSERTION PLEURAL DRAINAGE CATHETER (Right)  Patient Location: PACU  Anesthesia Type: MAC  Level of Consciousness: awake and alert   Airway and Oxygen Therapy: Patient Spontanous Breathing  Post-op Pain: mild  Post-op Assessment: Post-op Vital signs reviewed, Patient's Cardiovascular Status Stable, Respiratory Function Stable, Patent Airway and No signs of Nausea or vomiting  Last Vitals:  Filed Vitals:   05/24/15 1530  BP: 142/88  Pulse: 112  Temp: 36.1 C  Resp: 23    Post-op Vital Signs: stable   Complications: No apparent anesthesia complications

## 2015-05-26 DIAGNOSIS — J90 Pleural effusion, not elsewhere classified: Secondary | ICD-10-CM | POA: Diagnosis not present

## 2015-05-27 ENCOUNTER — Telehealth: Payer: Self-pay | Admitting: Oncology

## 2015-05-27 ENCOUNTER — Encounter: Payer: Self-pay | Admitting: *Deleted

## 2015-05-27 ENCOUNTER — Telehealth: Payer: Self-pay | Admitting: *Deleted

## 2015-05-27 ENCOUNTER — Ambulatory Visit (HOSPITAL_BASED_OUTPATIENT_CLINIC_OR_DEPARTMENT_OTHER): Payer: BLUE CROSS/BLUE SHIELD | Admitting: Oncology

## 2015-05-27 ENCOUNTER — Encounter (HOSPITAL_COMMUNITY): Payer: Self-pay | Admitting: Cardiothoracic Surgery

## 2015-05-27 VITALS — BP 132/99 | HR 119 | Temp 97.0°F | Resp 18 | Ht 70.0 in | Wt 173.5 lb

## 2015-05-27 DIAGNOSIS — C155 Malignant neoplasm of lower third of esophagus: Secondary | ICD-10-CM

## 2015-05-27 DIAGNOSIS — J90 Pleural effusion, not elsewhere classified: Secondary | ICD-10-CM

## 2015-05-27 DIAGNOSIS — C160 Malignant neoplasm of cardia: Secondary | ICD-10-CM

## 2015-05-27 DIAGNOSIS — E119 Type 2 diabetes mellitus without complications: Secondary | ICD-10-CM

## 2015-05-27 DIAGNOSIS — R918 Other nonspecific abnormal finding of lung field: Secondary | ICD-10-CM

## 2015-05-27 DIAGNOSIS — C7972 Secondary malignant neoplasm of left adrenal gland: Secondary | ICD-10-CM

## 2015-05-27 MED ORDER — PREDNISONE 10 MG PO TABS: 10.0000 mg | ORAL_TABLET | Freq: Every day | ORAL | Status: DC

## 2015-05-27 MED ORDER — PROCHLORPERAZINE MALEATE 10 MG PO TABS: 10.0000 mg | ORAL_TABLET | Freq: Four times a day (QID) | ORAL | Status: DC | PRN

## 2015-05-27 MED ORDER — OXYCODONE-ACETAMINOPHEN 5-325 MG PO TABS: 1.0000 | ORAL_TABLET | ORAL | Status: DC | PRN

## 2015-05-27 MED ORDER — MIRTAZAPINE 15 MG PO TBDP: 15.0000 mg | ORAL_TABLET | Freq: Every day | ORAL | Status: DC

## 2015-05-27 NOTE — Progress Notes (Signed)
Oncology Nurse Navigator Documentation  Oncology Nurse Navigator Flowsheets 05/27/2015  Navigator Encounter Type Initial MedOnc  Patient Visit Type Medonc  Treatment Phase Abnormal Scans;Treatment planning  Barriers/Navigation Needs Education  Education Pain/ Symptom Management;Preparing for Upcoming  Treatment  Interventions Education Method  Education Method Verbal;Written;Teach-back  Time Spent with Patient 15  Reviewed Taxol and Ramucirumab actions and side effects/precautions with patient and family member. Explained constipation potential of narcotic and how to manage. Actions of Remeron and Prednisone.

## 2015-05-27 NOTE — Progress Notes (Signed)
Joel Miller OFFICE PROGRESS NOTE   Diagnosis:  Gastroesophageal carcinoma  INTERVAL HISTORY:    he reports developing dyspnea and a cough beginning in early August. A chest x-ray  04/25/2015 revealed blunting of the bilateral costophrenic  Angles. He was referred to Dr. Melvyn Novas. A CT of the chest 05/14/2015 revealed a large right pleural effusion with pleural nodularity with nearly complete compression of the right lung. There is a small left pleural effusion , mediastinal lymphadenopathy, a left adrenal nodule, and left pulmonary nodules consistent with metastatic disease.   he underwent repeat therapeutic thoracentesis procedures and reports some relief and dyspnea. The cytology from a thoracentesis on 05/21/2015 revealed no malignant cells.    he was referred to Dr. Servando Snare and underwent  Placement of a right Pleurx catheter on 05/24/2015. He drained the catheter for 1250 mL on 05/26/2015.   Joel Miller complains of anorexia  and malaise. He has not worked for the past one and one half weeks. He stays in the home most of the time. He reports pain at the mid back that is chronic and associated with position changes.  Objective:  Vital signs in last 24 hours:  Blood pressure 132/99, pulse 119, temperature 97 F (36.1 C), temperature source Oral, resp. rate 18, height 5' 10"  (1.778 m), weight 173 lb 8 oz (78.699 kg), SpO2 98 %.    HEENT:  Neck without mass Lymphatics:  No cervical, supraclavicular, or axillary nodes Resp:  Decreased breath sounds throughout the right chest, no respiratory distress Cardio:  Regular rhythm, tachycardia GI:  No hepatomegaly, nontender, no mass Vascular:  No leg edema Neuro: alert and oriented , the motor exam is intact in the legs bilaterally   musculoskeletal: no spine tenderness   Portacath/PICC-without erythema  Lab Results:  Lab Results  Component Value Date   WBC 10.8* 05/24/2015   HGB 15.6 05/24/2015   HCT 46.2 05/24/2015   MCV 89.0 05/24/2015   PLT 289 05/24/2015   NEUTROABS 5.3 04/25/2015     Imaging:  Dg Chest Port 1 View  05/24/2015   CLINICAL DATA:  Right PleurX catheter insertion.  EXAM: DG C-ARM 1-60 MIN-NO REPORT; PORTABLE CHEST - 1 VIEW  COMPARISON:  05/23/2015  FINDINGS: Moderate interval improvement patient's right-sided pleural effusion with small residual right effusion. Residual opacification in the right base likely atelectasis. Right-sided PleurX catheter seen over the right lower thorax. No evidence of pneumothorax. Possible tiny amount of left pleural fluid. Remainder of the exam is unchanged.  IMPRESSION: Moderate improvement in patient's right-sided effusion with small residual right effusion with associated atelectasis in the right base. Right PleurX catheter over the right lower thorax.   Electronically Signed   By: Marin Olp M.D.   On: 05/24/2015 16:41   Dg C-arm 1-60 Min-no Report  05/24/2015   CLINICAL DATA: surgery'   C-ARM 1-60 MINUTES  Fluoroscopy was utilized by the requesting physician.  No radiographic  interpretation.     Medications: I have reviewed the patient's current medications.  Assessment/Plan: 1. Adenocarcinoma the esophagus/GE junction-status post an endoscopy 05/18/2014 confirming a gastric cardia/lower esophageal mass with tumor extending proximally in the esophagus to 25 cm from the incisors  HER-2/neu amplified   Staging CT scan 05/22/2014 with indeterminate bilateral pulmonary nodules; and prominent paraesophageal, porta hepatis, and para-aortic lymph nodes   Staging PET scan 05/29/2014 confirmed hypermetabolic soft tissue thickening at the distal third of the esophagus extending into the proximal stomach. 2 additional smaller areas of hypermetabolism  or noted in the more proximal esophagus with a mildly enlarged hypermetabolic gastrohepatic node.   Initiation of radiation and concurrent Taxol/carboplatin 06/11/2014; Taxol/carboplatin completed 07/09/2014;  radiation completed 07/18/2014.  Esophagectomy/jejunostomy feeding tube placement 09/05/2014 with the pathology revealing aypT3ypN2 tumor, HER-2/neu amplified, positive distal resection margin   Cycle 1 FOLFOX 11/01/2014  Cycle 2 FOLFOX 11/15/2014  Cycle 3 FOLFOX 11/29/2014  Cycle 4 FOLFOX 12/13/2014  Cycle 5 FOLFOX 12/27/2014  Cycle 6 FOLFOX 01/17/2015  CTs of the chest, abdomen, and pelvis on 02/01/2015-negative for recurrent esophagus cancer   CT of the chest 05/14/2015 revealed a large right pleural effusion,  Small left effusion,left lung nodules, mediastinal lymphadenopathy, and a left adrenal metastasis   Placement of a right Pleurx catheter 05/24/2015  2. History of anorexia/weight loss 3. History of solid dysphagia -improved 4. Diabetes  5. Hypertension  6. Hyperlipidemia  7. 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 I reviewed the CT images with Joel Miller and his wife. We will follow-up on the pleural fluid cytology from 05/24/2015, but it is very likely the presentation is related to metastatic gastroesophageal carcinoma.    we discussed the prognosis and treatment options. He understands no therapy will be curative. He has developed rapid recurrence of gastroesophageal carcinoma following completion of adjuvant therapy in May of this year.   He has a borderline performance status to receive systemic therapy. We discussed comfort care versus a trial of salvage systemic therapy. He would like to proceed with a trial of systemic therapy. The tumor is HER-2 amplified. I recommend weekly Taxol/Herceptin. We reviewed the potential toxicities associated with this regimen and he agrees to proceed.   we added Remeron , prednisone, and Percocet as supportive care medications.   The plan is to begin systemic therapy on 06/03/2015.    approximately 45 minutes were spent with the patient today.  The majority of the time was used for counseling and coordination of care.     Betsy Coder, MD  05/27/2015  5:11 PM

## 2015-05-27 NOTE — Telephone Encounter (Signed)
Gave and printed appt sched and avs for pt for Sept and OCT °

## 2015-05-27 NOTE — Telephone Encounter (Signed)
Per staff message and POF I have tried to schedule appts. Advised scheduler no available on 9/26, saved spot on 9/27. JMW

## 2015-05-27 NOTE — Telephone Encounter (Signed)
Message from [place] with [facility] reporting an interaction between Remeron and Tramadol that increases risk for seizures. Order given to discontinue Tramadol. Use Oxycodone for pain, per Dr. Benay Spice. Called pt with instructions, he voiced understanding.

## 2015-05-28 ENCOUNTER — Other Ambulatory Visit: Payer: Self-pay | Admitting: *Deleted

## 2015-05-28 ENCOUNTER — Telehealth: Payer: Self-pay | Admitting: Oncology

## 2015-05-28 NOTE — Telephone Encounter (Signed)
lvm for pt regarding to sept appt... °

## 2015-05-28 NOTE — Progress Notes (Signed)
POF placed per Dr. Benay Spice to change tx to taxol/herceptin- 1st time.  Message sent to [facility] re: pre-auth.

## 2015-05-29 ENCOUNTER — Telehealth: Payer: Self-pay | Admitting: Oncology

## 2015-05-29 ENCOUNTER — Other Ambulatory Visit: Payer: Self-pay | Admitting: *Deleted

## 2015-05-29 DIAGNOSIS — C61 Malignant neoplasm of prostate: Secondary | ICD-10-CM

## 2015-05-29 NOTE — Op Note (Signed)
NAME:  Joel Miller, Joel Miller NO.:  [id number]  MEDICAL RECORD NO.:  [id number]  LOCATION:  [facility]                         FACILITY:  [facility]  PHYSICIAN:  [name], MD    DATE OF BIRTH:  07-13-1956  DATE OF PROCEDURE:  05/24/2015 DATE OF DISCHARGE:  05/24/2015                              OPERATIVE REPORT   PREOPERATIVE DIAGNOSIS:  Probable malignant effusion related to esophageal cancer on the right, recurrent.  POSTOPERATIVE DIAGNOSIS:  Probable malignant effusion related to esophageal cancer on the right, recurrent.  SURGICAL PROCEDURE:  Both fluoroscopic and ultrasound guidance, placement of a right PleurX catheter, and drainage of right pleural fluid.  SURGEON:  [name], MD  BRIEF HISTORY:  The patient is a 59 year old male who approximately a year prior had undergone chemotherapy and radiation treatment for a stage IIIB adenocarcinoma of the esophagus and subsequent resection. The patient had been doing well most recent.  Chest x-ray was clear in July of 2016.  The patient presented, with increasing shortness of breath, to Pulmonology and large right pleural effusion was noted and tapped on 2 occasions.  The patient was referred for thoracic surgery after CT scan suggested recurrent disease in the right chest and possible metastatic disease in the left lung.  Options including a video- assisted thoracoscopy and talc pleurodesis versus PleurX catheter was discussed with the patient, and he agreed with proceeding with placement of PleurX catheter to control the effusion.  DESCRIPTION OF PROCEDURE:  Under light intravenous sedation, the right chest was prepped with Betadine and draped in a sterile manner. Previous preop markings were visible.  Appropriate time-out was performed.  1% lidocaine was infiltrated over the lower right chest. SonoSite ultrasound was used to confirm position of a large effusion.  A 16-gauge needle was introduced into the  right chest; and under fluoroscopic guidance, a guidewire was placed into the right chest. Counter incision was made anterior to the introduction of the wire, and a PleurX catheter was tunneled over the wire with fluoroscopic guidance. A peel-away dilators were placed, developed a tract, and a peel-away sheath was placed into the right chest.  The PleurX catheter was positioned into the right chest, then drained 2 L of bloody effusion. Appropriate studies and cultures and cytologies were sent to Pathology. The suction site was closed with an interrupted absorbable suture. Dermabond was placed on the wound.  Sponge and needle counts were reported as correct at the completion of the procedure.  The patient tolerated the procedure without obvious complication and was transferred to the recovery room for postoperative care.    [name], MD    EG/MEDQ  D:  05/29/2015  T:  05/29/2015  Job:  [id number]

## 2015-05-29 NOTE — Telephone Encounter (Signed)
Confirmed appointment for Echo 09/23

## 2015-05-31 ENCOUNTER — Ambulatory Visit (HOSPITAL_COMMUNITY)
Admission: RE | Admit: 2015-05-31 | Discharge: 2015-05-31 | Disposition: A | Discharge status: Home / Self Care | Payer: BLUE CROSS/BLUE SHIELD | Source: Ambulatory Visit | Attending: Oncology | Admitting: Oncology

## 2015-05-31 DIAGNOSIS — E785 Hyperlipidemia, unspecified: Secondary | ICD-10-CM | POA: Insufficient documentation

## 2015-05-31 DIAGNOSIS — C155 Malignant neoplasm of lower third of esophagus: Secondary | ICD-10-CM | POA: Insufficient documentation

## 2015-05-31 DIAGNOSIS — E119 Type 2 diabetes mellitus without complications: Secondary | ICD-10-CM | POA: Diagnosis not present

## 2015-05-31 DIAGNOSIS — I313 Pericardial effusion (noninflammatory): Secondary | ICD-10-CM | POA: Insufficient documentation

## 2015-05-31 DIAGNOSIS — I10 Essential (primary) hypertension: Secondary | ICD-10-CM | POA: Insufficient documentation

## 2015-05-31 DIAGNOSIS — R0600 Dyspnea, unspecified: Secondary | ICD-10-CM | POA: Insufficient documentation

## 2015-06-01 ENCOUNTER — Other Ambulatory Visit: Payer: Self-pay | Admitting: Oncology

## 2015-06-02 ENCOUNTER — Other Ambulatory Visit: Payer: Self-pay | Admitting: Oncology

## 2015-06-03 ENCOUNTER — Other Ambulatory Visit: Payer: Self-pay | Admitting: Internal Medicine

## 2015-06-03 ENCOUNTER — Telehealth: Payer: Self-pay | Admitting: Oncology

## 2015-06-03 ENCOUNTER — Ambulatory Visit (HOSPITAL_BASED_OUTPATIENT_CLINIC_OR_DEPARTMENT_OTHER): Payer: BLUE CROSS/BLUE SHIELD | Admitting: Oncology

## 2015-06-03 ENCOUNTER — Other Ambulatory Visit (HOSPITAL_BASED_OUTPATIENT_CLINIC_OR_DEPARTMENT_OTHER): Payer: BLUE CROSS/BLUE SHIELD

## 2015-06-03 VITALS — BP 121/87 | HR 118 | Temp 97.6°F | Resp 17 | Ht 70.0 in | Wt 173.9 lb

## 2015-06-03 DIAGNOSIS — C155 Malignant neoplasm of lower third of esophagus: Secondary | ICD-10-CM | POA: Diagnosis not present

## 2015-06-03 LAB — COMPREHENSIVE METABOLIC PANEL (CC13)
ALBUMIN: 2.3 g/dL — AB (ref 3.5–5.0)
ALK PHOS: 143 U/L (ref 40–150)
ALT: 18 U/L (ref 0–55)
AST: 26 U/L (ref 5–34)
Anion Gap: 9 mEq/L (ref 3–11)
BUN: 21.6 mg/dL (ref 7.0–26.0)
CHLORIDE: 98 meq/L (ref 98–109)
CO2: 22 meq/L (ref 22–29)
Calcium: 9.2 mg/dL (ref 8.4–10.4)
Creatinine: 0.9 mg/dL (ref 0.7–1.3)
GLUCOSE: 182 mg/dL — AB (ref 70–140)
POTASSIUM: 5.1 meq/L (ref 3.5–5.1)
SODIUM: 129 meq/L — AB (ref 136–145)
Total Bilirubin: 0.37 mg/dL (ref 0.20–1.20)
Total Protein: 6.1 g/dL — ABNORMAL LOW (ref 6.4–8.3)

## 2015-06-03 LAB — CBC WITH DIFFERENTIAL/PLATELET
BASO%: 0.3 % (ref 0.0–2.0)
BASOS ABS: 0 10*3/uL (ref 0.0–0.1)
EOS%: 0.7 % (ref 0.0–7.0)
Eosinophils Absolute: 0.1 10*3/uL (ref 0.0–0.5)
HCT: 43.8 % (ref 38.4–49.9)
HEMOGLOBIN: 14.6 g/dL (ref 13.0–17.1)
LYMPH%: 2.9 % — AB (ref 14.0–49.0)
MCH: 29.4 pg (ref 27.2–33.4)
MCHC: 33.4 g/dL (ref 32.0–36.0)
MCV: 87.8 fL (ref 79.3–98.0)
MONO#: 0.9 10*3/uL (ref 0.1–0.9)
MONO%: 7.6 % (ref 0.0–14.0)
NEUT#: 10.7 10*3/uL — ABNORMAL HIGH (ref 1.5–6.5)
NEUT%: 88.5 % — ABNORMAL HIGH (ref 39.0–75.0)
Platelets: 322 10*3/uL (ref 140–400)
RBC: 4.98 10*6/uL (ref 4.20–5.82)
RDW: 13 % (ref 11.0–14.6)
WBC: 12.1 10*3/uL — AB (ref 4.0–10.3)
lymph#: 0.4 10*3/uL — ABNORMAL LOW (ref 0.9–3.3)

## 2015-06-03 LAB — CEA: CEA: 2.2 ng/mL (ref 0.0–5.0)

## 2015-06-03 NOTE — Progress Notes (Signed)
  [facility] OFFICE PROGRESS NOTE   Diagnosis: Gastroesophageal carcinoma  INTERVAL HISTORY:   Joel Miller returns for scheduled visit. He continues to drain 300-400 mL from the right Pleurx daily. He is able to ambulate in the home. He has pain in various areas of the mid and lower back that is relieved with one half of an oxycodone tablet. He takes the oxycodone approximately every 3 hours during the day.  Objective:  Vital signs in last 24 hours:  Blood pressure 121/87, pulse 118, temperature 97.6 F (36.4 C), temperature source Axillary, resp. rate 17, height _0  (1.778 m), weight 173 lb 14.4 oz (78.881 kg), SpO2 97 %.    Resp: Lungs clear bilaterally Cardio: Regular rate and rhythm GI: No hepatosplenomegaly, nontender Vascular: No leg edema Musculoskeletal: No spine tenderness    Lab Results:  Lab Results  Component Value Date   WBC 12.1* 06/03/2015   HGB 14.6 06/03/2015   HCT 43.8 06/03/2015   MCV 87.8 06/03/2015   PLT 322 06/03/2015   NEUTROABS 10.7* 06/03/2015    Medications: I have reviewed the patient's current medications.  Assessment/Plan: 1. Adenocarcinoma the esophagus/GE junction-status post an endoscopy 05/18/2014 confirming a gastric cardia/lower esophageal mass with tumor extending proximally in the esophagus to 25 cm from the incisors  HER-2/neu amplified   Staging CT scan 05/22/2014 with indeterminate bilateral pulmonary nodules; and prominent paraesophageal, porta hepatis, and para-aortic lymph nodes   Staging PET scan 05/29/2014 confirmed hypermetabolic soft tissue thickening at the distal third of the esophagus extending into the proximal stomach. 2 additional smaller areas of hypermetabolism or noted in the more proximal esophagus with a mildly enlarged hypermetabolic gastrohepatic node.   Initiation of radiation and concurrent Taxol/carboplatin 06/11/2014; Taxol/carboplatin completed 07/09/2014; radiation completed  07/18/2014.  Esophagectomy/jejunostomy feeding tube placement 09/05/2014 with the pathology revealing aypT3ypN2 tumor, HER-2/neu amplified, positive distal resection margin   Cycle 1 FOLFOX 11/01/2014  Cycle 2 FOLFOX 11/15/2014  Cycle 3 FOLFOX 11/29/2014  Cycle 4 FOLFOX 12/13/2014  Cycle 5 FOLFOX 12/27/2014  Cycle 6 FOLFOX 01/17/2015  CTs of the chest, abdomen, and pelvis on 02/01/2015-negative for recurrent esophagus cancer  CT of the chest 05/14/2015 revealed a large right pleural effusion, Small left effusion,left lung nodules, mediastinal lymphadenopathy, and a left adrenal metastasis  Placement of a right Pleurx catheter 05/24/2015  Cycle 1 Taxol/Herceptin 06/04/2015  Echocardiogram 05/31/2015-LVEF 60-65%  2. History of anorexia/weight loss 3. History of solid dysphagia -improved 4. Diabetes  5. Hypertension  6. Hyperlipidemia  7. Back pain-potentially related to chronic spine disease versus metastases, he will continue Percocet as needed   Disposition:  Joel Miller appears stable. The plan is to proceed with Taxol/Herceptin beginning 07/04/2015. He will continue draining the Pleurx as directed by Dr. Servando Snare. We will obtain imaging of the spine if the back pain worsens. He will return for an office visit and chemotherapy 06/10/2015.  Betsy Coder, MD  06/03/2015  10:53 AM

## 2015-06-03 NOTE — Telephone Encounter (Signed)
Gave and printed appt sched and avs for pt for Sept and OCT °

## 2015-06-04 ENCOUNTER — Ambulatory Visit (HOSPITAL_BASED_OUTPATIENT_CLINIC_OR_DEPARTMENT_OTHER): Payer: BLUE CROSS/BLUE SHIELD

## 2015-06-04 VITALS — BP 138/97 | HR 110 | Temp 98.7°F | Resp 18

## 2015-06-04 DIAGNOSIS — Z5112 Encounter for antineoplastic immunotherapy: Secondary | ICD-10-CM

## 2015-06-04 DIAGNOSIS — C159 Malignant neoplasm of esophagus, unspecified: Secondary | ICD-10-CM

## 2015-06-04 DIAGNOSIS — C155 Malignant neoplasm of lower third of esophagus: Secondary | ICD-10-CM | POA: Diagnosis not present

## 2015-06-04 DIAGNOSIS — Z5111 Encounter for antineoplastic chemotherapy: Secondary | ICD-10-CM

## 2015-06-04 DIAGNOSIS — C160 Malignant neoplasm of cardia: Secondary | ICD-10-CM | POA: Diagnosis not present

## 2015-06-04 DIAGNOSIS — C61 Malignant neoplasm of prostate: Secondary | ICD-10-CM

## 2015-06-04 DIAGNOSIS — J90 Pleural effusion, not elsewhere classified: Secondary | ICD-10-CM

## 2015-06-04 MED ORDER — FAMOTIDINE IN NACL 20-0.9 MG/50ML-% IV SOLN
20.0000 mg | Freq: Once | INTRAVENOUS | Status: AC
  Administered 2015-06-04: 20 mg via INTRAVENOUS

## 2015-06-04 MED ORDER — SODIUM CHLORIDE 0.9 % IV SOLN
80.0000 mg/m2 | Freq: Once | INTRAVENOUS | Status: AC
  Administered 2015-06-04: 156 mg via INTRAVENOUS
  Filled 2015-06-04: qty 26

## 2015-06-04 MED ORDER — TRASTUZUMAB CHEMO INJECTION 440 MG
4.0000 mg/kg | Freq: Once | INTRAVENOUS | Status: AC
  Administered 2015-06-04: 315 mg via INTRAVENOUS
  Filled 2015-06-04: qty 15

## 2015-06-04 MED ORDER — SODIUM CHLORIDE 0.9 % IV SOLN
Freq: Once | INTRAVENOUS | Status: AC
  Administered 2015-06-04: 09:00:00 via INTRAVENOUS

## 2015-06-04 MED ORDER — DIPHENHYDRAMINE HCL 50 MG/ML IJ SOLN
50.0000 mg | Freq: Once | INTRAMUSCULAR | Status: AC
  Administered 2015-06-04: 50 mg via INTRAVENOUS

## 2015-06-04 MED ORDER — FAMOTIDINE IN NACL 20-0.9 MG/50ML-% IV SOLN
INTRAVENOUS | Status: AC
  Filled 2015-06-04: qty 50

## 2015-06-04 MED ORDER — DIPHENHYDRAMINE HCL 50 MG/ML IJ SOLN
INTRAMUSCULAR | Status: AC
  Filled 2015-06-04: qty 1

## 2015-06-04 MED ORDER — SODIUM CHLORIDE 0.9 % IV SOLN
Freq: Once | INTRAVENOUS | Status: AC
  Administered 2015-06-04: 11:00:00 via INTRAVENOUS
  Filled 2015-06-04: qty 4

## 2015-06-04 MED ORDER — ACETAMINOPHEN 325 MG PO TABS
ORAL_TABLET | ORAL | Status: AC
Start: 2015-06-04 — End: 2015-06-04
  Filled 2015-06-04: qty 2

## 2015-06-04 MED ORDER — PACLITAXEL CHEMO INJECTION 300 MG/50ML: 80.0000 mg/m2 | Freq: Once | INTRAVENOUS | Status: DC

## 2015-06-04 MED ORDER — ACETAMINOPHEN 325 MG PO TABS
650.0000 mg | ORAL_TABLET | Freq: Once | ORAL | Status: AC
  Administered 2015-06-04: 650 mg via ORAL

## 2015-06-04 NOTE — Patient Instructions (Signed)
Trastuzumab injection for infusion What is this medicine? TRASTUZUMAB (tras TOO zoo mab) is a monoclonal antibody. It targets a protein called HER2. This protein is found in some stomach and breast cancers. This medicine can stop cancer cell growth. This medicine may be used with other cancer treatments. This medicine may be used for other purposes; ask your health care provider or pharmacist if you have questions. COMMON BRAND NAME(S): Herceptin What should I tell my health care provider before I take this medicine? They need to know if you have any of these conditions: -heart disease -heart failure -infection (especially a virus infection such as chickenpox, cold sores, or herpes) -lung or breathing disease, like asthma -recent or ongoing radiation therapy -an unusual or allergic reaction to trastuzumab, benzyl alcohol, or other medications, foods, dyes, or preservatives -pregnant or trying to get pregnant -breast-feeding How should I use this medicine? This drug is given as an infusion into a vein. It is administered in a hospital or clinic by a specially trained health care professional. Talk to your pediatrician regarding the use of this medicine in children. This medicine is not approved for use in children. Overdosage: If you think you have taken too much of this medicine contact a poison control center or emergency room at once. NOTE: This medicine is only for you. Do not share this medicine with others. What if I miss a dose? It is important not to miss a dose. Call your doctor or health care professional if you are unable to keep an appointment. What may interact with this medicine? -cyclophosphamide -doxorubicin -warfarin This list may not describe all possible interactions. Give your health care provider a list of all the medicines, herbs, non-prescription drugs, or dietary supplements you use. Also tell them if you smoke, drink alcohol, or use illegal drugs. Some items may  interact with your medicine. What should I watch for while using this medicine? Visit your doctor for checks on your progress. Report any side effects. Continue your course of treatment even though you feel ill unless your doctor tells you to stop. Call your doctor or health care professional for advice if you get a fever, chills or sore throat, or other symptoms of a cold or flu. Do not treat yourself. Try to avoid being around people who are sick. You may experience fever, chills and shaking during your first infusion. These effects are usually mild and can be treated with other medicines. Report any side effects during the infusion to your health care professional. Fever and chills usually do not happen with later infusions. What side effects may I notice from receiving this medicine? Side effects that you should report to your doctor or other health care professional as soon as possible: -breathing difficulties -chest pain or palpitations -cough -dizziness or fainting -fever or chills, sore throat -skin rash, itching or hives -swelling of the legs or ankles -unusually weak or tired Side effects that usually do not require medical attention (report to your doctor or other health care professional if they continue or are bothersome): -loss of appetite -headache -muscle aches -nausea This list may not describe all possible side effects. Call your doctor for medical advice about side effects. You may report side effects to FDA at 1-800-FDA-1088. Where should I keep my medicine? This drug is given in a hospital or clinic and will not be stored at home. NOTE: This sheet is a summary. It may not cover all possible information. If you have questions about this medicine, talk   to your doctor, pharmacist, or health care provider.  2015, Elsevier/Gold Standard. (2009-06-28 13:43:15)  Paclitaxel injection What is this medicine? PACLITAXEL (PAK li TAX el) is a chemotherapy drug. It targets fast  dividing cells, like cancer cells, and causes these cells to die. This medicine is used to treat ovarian cancer, breast cancer, and other cancers. This medicine may be used for other purposes; ask your health care provider or pharmacist if you have questions. COMMON BRAND NAME(S): Onxol, Taxol What should I tell my health care provider before I take this medicine? They need to know if you have any of these conditions: -blood disorders -irregular heartbeat -infection (especially a virus infection such as chickenpox, cold sores, or herpes) -liver disease -previous or ongoing radiation therapy -an unusual or allergic reaction to paclitaxel, alcohol, polyoxyethylated castor oil, other chemotherapy agents, other medicines, foods, dyes, or preservatives -pregnant or trying to get pregnant -breast-feeding How should I use this medicine? This drug is given as an infusion into a vein. It is administered in a hospital or clinic by a specially trained health care professional. Talk to your pediatrician regarding the use of this medicine in children. Special care may be needed. Overdosage: If you think you have taken too much of this medicine contact a poison control center or emergency room at once. NOTE: This medicine is only for you. Do not share this medicine with others. What if I miss a dose? It is important not to miss your dose. Call your doctor or health care professional if you are unable to keep an appointment. What may interact with this medicine? Do not take this medicine with any of the following medications: -disulfiram -metronidazole This medicine may also interact with the following medications: -cyclosporine -diazepam -ketoconazole -medicines to increase blood counts like filgrastim, pegfilgrastim, sargramostim -other chemotherapy drugs like cisplatin, doxorubicin, epirubicin, etoposide, teniposide, vincristine -quinidine -testosterone -vaccines -verapamil Talk to your doctor  or health care professional before taking any of these medicines: -acetaminophen -aspirin -ibuprofen -ketoprofen -naproxen This list may not describe all possible interactions. Give your health care provider a list of all the medicines, herbs, non-prescription drugs, or dietary supplements you use. Also tell them if you smoke, drink alcohol, or use illegal drugs. Some items may interact with your medicine. What should I watch for while using this medicine? Your condition will be monitored carefully while you are receiving this medicine. You will need important blood work done while you are taking this medicine. This drug may make you feel generally unwell. This is not uncommon, as chemotherapy can affect healthy cells as well as cancer cells. Report any side effects. Continue your course of treatment even though you feel ill unless your doctor tells you to stop. In some cases, you may be given additional medicines to help with side effects. Follow all directions for their use. Call your doctor or health care professional for advice if you get a fever, chills or sore throat, or other symptoms of a cold or flu. Do not treat yourself. This drug decreases your body's ability to fight infections. Try to avoid being around people who are sick. This medicine may increase your risk to bruise or bleed. Call your doctor or health care professional if you notice any unusual bleeding. Be careful brushing and flossing your teeth or using a toothpick because you may get an infection or bleed more easily. If you have any dental work done, tell your dentist you are receiving this medicine. Avoid taking products that contain aspirin,  acetaminophen, ibuprofen, naproxen, or ketoprofen unless instructed by your doctor. These medicines may hide a fever. Do not become pregnant while taking this medicine. Women should inform their doctor if they wish to become pregnant or think they might be pregnant. There is a potential  for serious side effects to an unborn child. Talk to your health care professional or pharmacist for more information. Do not breast-feed an infant while taking this medicine. Men are advised not to father a child while receiving this medicine. What side effects may I notice from receiving this medicine? Side effects that you should report to your doctor or health care professional as soon as possible: -allergic reactions like skin rash, itching or hives, swelling of the face, lips, or tongue -low blood counts - This drug may decrease the number of white blood cells, red blood cells and platelets. You may be at increased risk for infections and bleeding. -signs of infection - fever or chills, cough, sore throat, pain or difficulty passing urine -signs of decreased platelets or bleeding - bruising, pinpoint red spots on the skin, black, tarry stools, nosebleeds -signs of decreased red blood cells - unusually weak or tired, fainting spells, lightheadedness -breathing problems -chest pain -high or low blood pressure -mouth sores -nausea and vomiting -pain, swelling, redness or irritation at the injection site -pain, tingling, numbness in the hands or feet -slow or irregular heartbeat -swelling of the ankle, feet, hands Side effects that usually do not require medical attention (report to your doctor or health care professional if they continue or are bothersome): -bone pain -complete hair loss including hair on your head, underarms, pubic hair, eyebrows, and eyelashes -changes in the color of fingernails -diarrhea -loosening of the fingernails -loss of appetite -muscle or joint pain -red flush to skin -sweating This list may not describe all possible side effects. Call your doctor for medical advice about side effects. You may report side effects to FDA at 1-800-FDA-1088. Where should I keep my medicine? This drug is given in a hospital or clinic and will not be stored at home. NOTE: This  sheet is a summary. It may not cover all possible information. If you have questions about this medicine, talk to your doctor, pharmacist, or health care provider.  2015, Elsevier/Gold Standard. (2012-10-17 16:41:21)  [facility] Discharge Instructions for Patients Receiving Chemotherapy  Today you received the following chemotherapy agents Herceptin/Taxol.  To help prevent nausea and vomiting after your treatment, we encourage you to take your nausea medication as directed.   If you develop nausea and vomiting that is not controlled by your nausea medication, call the clinic.   BELOW ARE SYMPTOMS THAT SHOULD BE REPORTED IMMEDIATELY:  *FEVER GREATER THAN 100.5 F  *CHILLS WITH OR WITHOUT FEVER  NAUSEA AND VOMITING THAT IS NOT CONTROLLED WITH YOUR NAUSEA MEDICATION  *UNUSUAL SHORTNESS OF BREATH  *UNUSUAL BRUISING OR BLEEDING  TENDERNESS IN MOUTH AND THROAT WITH OR WITHOUT PRESENCE OF ULCERS  *URINARY PROBLEMS  *BOWEL PROBLEMS  UNUSUAL RASH Items with * indicate a potential emergency and should be followed up as soon as possible.  Feel free to call the clinic you have any questions or concerns. The clinic phone number is [phone].  Please show the [facility] at check-in to the Emergency Department and triage nurse.

## 2015-06-08 ENCOUNTER — Other Ambulatory Visit: Payer: Self-pay | Admitting: Oncology

## 2015-06-10 ENCOUNTER — Encounter: Payer: Self-pay | Admitting: *Deleted

## 2015-06-10 ENCOUNTER — Other Ambulatory Visit: Payer: BLUE CROSS/BLUE SHIELD

## 2015-06-10 ENCOUNTER — Ambulatory Visit (HOSPITAL_BASED_OUTPATIENT_CLINIC_OR_DEPARTMENT_OTHER): Payer: BLUE CROSS/BLUE SHIELD

## 2015-06-10 ENCOUNTER — Other Ambulatory Visit (HOSPITAL_BASED_OUTPATIENT_CLINIC_OR_DEPARTMENT_OTHER): Payer: BLUE CROSS/BLUE SHIELD

## 2015-06-10 ENCOUNTER — Ambulatory Visit (HOSPITAL_BASED_OUTPATIENT_CLINIC_OR_DEPARTMENT_OTHER): Payer: BLUE CROSS/BLUE SHIELD | Admitting: Nurse Practitioner

## 2015-06-10 VITALS — BP 132/84 | HR 101 | Temp 97.9°F | Resp 18 | Ht 70.0 in | Wt 170.0 lb

## 2015-06-10 DIAGNOSIS — E119 Type 2 diabetes mellitus without complications: Secondary | ICD-10-CM | POA: Diagnosis not present

## 2015-06-10 DIAGNOSIS — C7972 Secondary malignant neoplasm of left adrenal gland: Secondary | ICD-10-CM

## 2015-06-10 DIAGNOSIS — C160 Malignant neoplasm of cardia: Secondary | ICD-10-CM

## 2015-06-10 DIAGNOSIS — J90 Pleural effusion, not elsewhere classified: Secondary | ICD-10-CM

## 2015-06-10 DIAGNOSIS — C155 Malignant neoplasm of lower third of esophagus: Secondary | ICD-10-CM

## 2015-06-10 DIAGNOSIS — C159 Malignant neoplasm of esophagus, unspecified: Secondary | ICD-10-CM

## 2015-06-10 DIAGNOSIS — Z5112 Encounter for antineoplastic immunotherapy: Secondary | ICD-10-CM

## 2015-06-10 DIAGNOSIS — Z5111 Encounter for antineoplastic chemotherapy: Secondary | ICD-10-CM

## 2015-06-10 DIAGNOSIS — C61 Malignant neoplasm of prostate: Secondary | ICD-10-CM

## 2015-06-10 LAB — CBC WITH DIFFERENTIAL/PLATELET
BASO%: 0.2 % (ref 0.0–2.0)
BASOS ABS: 0 10*3/uL (ref 0.0–0.1)
EOS ABS: 0.1 10*3/uL (ref 0.0–0.5)
EOS%: 0.8 % (ref 0.0–7.0)
HCT: 41.5 % (ref 38.4–49.9)
HGB: 14.2 g/dL (ref 13.0–17.1)
LYMPH%: 2.4 % — AB (ref 14.0–49.0)
MCH: 29.9 pg (ref 27.2–33.4)
MCHC: 34.2 g/dL (ref 32.0–36.0)
MCV: 87.4 fL (ref 79.3–98.0)
MONO#: 0.3 10*3/uL (ref 0.1–0.9)
MONO%: 3.7 % (ref 0.0–14.0)
NEUT#: 8.5 10*3/uL — ABNORMAL HIGH (ref 1.5–6.5)
NEUT%: 92.9 % — ABNORMAL HIGH (ref 39.0–75.0)
NRBC: 0 % (ref 0–0)
PLATELETS: 269 10*3/uL (ref 140–400)
RBC: 4.75 10*6/uL (ref 4.20–5.82)
RDW: 13.2 % (ref 11.0–14.6)
WBC: 9.1 10*3/uL (ref 4.0–10.3)
lymph#: 0.2 10*3/uL — ABNORMAL LOW (ref 0.9–3.3)

## 2015-06-10 MED ORDER — TRASTUZUMAB CHEMO INJECTION 440 MG
2.0000 mg/kg | Freq: Once | INTRAVENOUS | Status: AC
  Administered 2015-06-10: 147 mg via INTRAVENOUS
  Filled 2015-06-10: qty 7

## 2015-06-10 MED ORDER — SODIUM CHLORIDE 0.9 % IV SOLN
Freq: Once | INTRAVENOUS | Status: AC
  Administered 2015-06-10: 13:00:00 via INTRAVENOUS
  Filled 2015-06-10: qty 4

## 2015-06-10 MED ORDER — FAMOTIDINE IN NACL 20-0.9 MG/50ML-% IV SOLN
20.0000 mg | Freq: Once | INTRAVENOUS | Status: AC
  Administered 2015-06-10: 20 mg via INTRAVENOUS

## 2015-06-10 MED ORDER — FAMOTIDINE IN NACL 20-0.9 MG/50ML-% IV SOLN
INTRAVENOUS | Status: AC
  Filled 2015-06-10: qty 50

## 2015-06-10 MED ORDER — DIPHENHYDRAMINE HCL 50 MG/ML IJ SOLN
25.0000 mg | Freq: Once | INTRAMUSCULAR | Status: AC
  Administered 2015-06-10: 25 mg via INTRAVENOUS

## 2015-06-10 MED ORDER — DIPHENHYDRAMINE HCL 50 MG/ML IJ SOLN
INTRAMUSCULAR | Status: AC
  Filled 2015-06-10: qty 1

## 2015-06-10 MED ORDER — ACETAMINOPHEN 325 MG PO TABS
650.0000 mg | ORAL_TABLET | Freq: Once | ORAL | Status: AC
  Administered 2015-06-10: 650 mg via ORAL

## 2015-06-10 MED ORDER — ACETAMINOPHEN 325 MG PO TABS
ORAL_TABLET | ORAL | Status: AC
  Filled 2015-06-10: qty 2

## 2015-06-10 MED ORDER — OXYCODONE-ACETAMINOPHEN 5-325 MG PO TABS: 1.0000 | ORAL_TABLET | ORAL | Status: DC | PRN

## 2015-06-10 MED ORDER — SODIUM CHLORIDE 0.9 % IV SOLN
Freq: Once | INTRAVENOUS | Status: AC
  Administered 2015-06-10: 12:00:00 via INTRAVENOUS

## 2015-06-10 MED ORDER — SODIUM CHLORIDE 0.9 % IV SOLN
80.0000 mg/m2 | Freq: Once | INTRAVENOUS | Status: AC
  Administered 2015-06-10: 156 mg via INTRAVENOUS
  Filled 2015-06-10: qty 26

## 2015-06-10 NOTE — Progress Notes (Signed)
Okay to treat with last weeks CMet per Dr. Benay Spice.    Discussed with pt how his week has gone since receiving his first chemotherapy infusion with Herceptin/Taxol on 10/26.  Pt reporting fatigue but no other complaints at this time.  Pt denies needing anything further at this time and understands to let us know if anything comes up or he has further questions or concerns.

## 2015-06-10 NOTE — Patient Instructions (Signed)
[facility] Discharge Instructions for Patients Receiving Chemotherapy  Today you received the following chemotherapy agents Herceptin/Taxol.  To help prevent nausea and vomiting after your treatment, we encourage you to take your nausea medication as directed.   If you develop nausea and vomiting that is not controlled by your nausea medication, call the clinic.   BELOW ARE SYMPTOMS THAT SHOULD BE REPORTED IMMEDIATELY:  *FEVER GREATER THAN 100.5 F  *CHILLS WITH OR WITHOUT FEVER  NAUSEA AND VOMITING THAT IS NOT CONTROLLED WITH YOUR NAUSEA MEDICATION  *UNUSUAL SHORTNESS OF BREATH  *UNUSUAL BRUISING OR BLEEDING  TENDERNESS IN MOUTH AND THROAT WITH OR WITHOUT PRESENCE OF ULCERS  *URINARY PROBLEMS  *BOWEL PROBLEMS  UNUSUAL RASH Items with * indicate a potential emergency and should be followed up as soon as possible.  Feel free to call the clinic you have any questions or concerns. The clinic phone number is [phone].  Please show the CHEMO ALERT CARD at check-in to the Emergency Department and triage nurse.    

## 2015-06-10 NOTE — Progress Notes (Signed)
Per Dr. Benay Spice : chemo is palliative only-not curative. Hope to see signs of improvement by 1-2 months. Signs of improvement will be better appetite w/weight gain, improved energy, improvement in pain. Encourages him to try taking the Remeron.  This RN will call him tomorrow with MD response.

## 2015-06-10 NOTE — Progress Notes (Signed)
Oncology Nurse Navigator Documentation  Oncology Nurse Navigator Flowsheets 06/10/2015  Navigator Encounter Type Treatment  Patient Visit Type Medonc  Treatment Phase Treatment- Taxol   Barriers/Navigation Needs Family concerns  Education Coping with Diagnosis/ Prognosis  Interventions Listened to his concerns and will review with physician  Education Method -  Support Groups/Services GI  Time Spent with Patient 10  Affect remains flat-has not been taking his Remeron according to nursing assessment. He is asking when he may see signs of improvement to show chemo is helping and what these signs may be. Informed him I will follow up with MD regarding his questions to ensure most accurate response.

## 2015-06-10 NOTE — Progress Notes (Signed)
  [place] OFFICE PROGRESS NOTE   Diagnosis:  Gastroesophageal carcinoma   INTERVAL HISTORY:   Joel Miller returns as scheduled. He completed week 1 Taxol/Herceptin 06/04/2015. He denies significant nausea/vomiting. No mouth sores. No diarrhea. He has a bowel movement every 2-3 days. He takes a stool softener. Appetite is poor. He is draining about 800 mL from the right Pleurx every other day. He takes pain medication every 3 hours for his cough as well as back pain.  Objective:  Vital signs in last 24 hours:  Blood pressure 132/84, pulse 101, temperature 97.9 F (36.6 C), temperature source Oral, resp. rate 18, height $RemoveBe'5\' 10"'zVddeRHzW$  (1.778 m), weight 170 lb (77.111 kg), SpO2 98 %.    HEENT: No thrush or ulcers. Resp: Lungs clear bilaterally. No respiratory distress. Right Pleurx catheter. Cardio: Regular rate and rhythm. GI: Abdomen soft and nontender. No organomegaly. Vascular: No leg edema. Calves soft and nontender.    Lab Results:  Lab Results  Component Value Date   WBC 9.1 06/10/2015   HGB 14.2 06/10/2015   HCT 41.5 06/10/2015   MCV 87.4 06/10/2015   PLT 269 06/10/2015   NEUTROABS 8.5* 06/10/2015    Imaging:  No results found.  Medications: I have reviewed the patient's current medications.  Assessment/Plan: 1. Adenocarcinoma the esophagus/GE junction-status post an endoscopy 05/18/2014 confirming a gastric cardia/lower esophageal mass with tumor extending proximally in the esophagus to 25 cm from the incisors  HER-2/neu amplified   Staging CT scan 05/22/2014 with indeterminate bilateral pulmonary nodules; and prominent paraesophageal, porta hepatis, and para-aortic lymph nodes   Staging PET scan 05/29/2014 confirmed hypermetabolic soft tissue thickening at the distal third of the esophagus extending into the proximal stomach. 2 additional smaller areas of hypermetabolism or noted in the more proximal esophagus with a mildly enlarged hypermetabolic  gastrohepatic node.   Initiation of radiation and concurrent Taxol/carboplatin 06/11/2014; Taxol/carboplatin completed 07/09/2014; radiation completed 07/18/2014.  Esophagectomy/jejunostomy feeding tube placement 09/05/2014 with the pathology revealing aypT3ypN2 tumor, HER-2/neu amplified, positive distal resection margin   Cycle 1 FOLFOX 11/01/2014  Cycle 2 FOLFOX 11/15/2014  Cycle 3 FOLFOX 11/29/2014  Cycle 4 FOLFOX 12/13/2014  Cycle 5 FOLFOX 12/27/2014  Cycle 6 FOLFOX 01/17/2015  CTs of the chest, abdomen, and pelvis on 02/01/2015-negative for recurrent esophagus cancer  CT of the chest 05/14/2015 revealed a large right pleural effusion,small left effusion, left lung nodules, mediastinal lymphadenopathy, and a left adrenal metastasis  Placement of a right Pleurx catheter 05/24/2015  Echocardiogram 05/31/2015-LVEF 60-65%  Cycle 1 Taxol/Herceptin 06/04/2015  Cycle 2 Taxol/Herceptin 06/10/2015  2. History of anorexia/weight loss 3. History of solid dysphagia -improved 4. Diabetes  5. Hypertension  6. Hyperlipidemia  7. Back pain-potentially related to chronic spine disease versus metastases. He continues Percocet as needed   Disposition: Mr. Massmann appears stable. He has completed 1 cycle of weekly Taxol/Herceptin. Plan to proceed with cycle 2 today as scheduled. He will return for a follow-up visit and cycle 3 in one week. He will contact the office in the interim with any problems. He was given a new prescription for Percocet at today's visit.    Joel Miller ANP/GNP-BC   06/10/2015  10:40 AM

## 2015-06-11 ENCOUNTER — Other Ambulatory Visit: Payer: Self-pay | Admitting: Cardiothoracic Surgery

## 2015-06-11 DIAGNOSIS — J90 Pleural effusion, not elsewhere classified: Secondary | ICD-10-CM

## 2015-06-12 LAB — FUNGUS CULTURE W SMEAR: FUNGAL SMEAR: NONE SEEN

## 2015-06-13 ENCOUNTER — Ambulatory Visit (INDEPENDENT_AMBULATORY_CARE_PROVIDER_SITE_OTHER): Payer: BLUE CROSS/BLUE SHIELD | Admitting: Cardiothoracic Surgery

## 2015-06-13 ENCOUNTER — Ambulatory Visit
Admission: RE | Admit: 2015-06-13 | Discharge: 2015-06-13 | Disposition: A | Discharge status: Home / Self Care | Payer: BLUE CROSS/BLUE SHIELD | Source: Ambulatory Visit | Attending: Cardiothoracic Surgery | Admitting: Cardiothoracic Surgery

## 2015-06-13 ENCOUNTER — Encounter: Payer: Self-pay | Admitting: Cardiothoracic Surgery

## 2015-06-13 ENCOUNTER — Telehealth: Payer: Self-pay | Admitting: *Deleted

## 2015-06-13 VITALS — BP 122/87 | HR 111 | Resp 16 | Ht 70.0 in | Wt 170.0 lb

## 2015-06-13 DIAGNOSIS — J948 Other specified pleural conditions: Secondary | ICD-10-CM

## 2015-06-13 DIAGNOSIS — C155 Malignant neoplasm of lower third of esophagus: Secondary | ICD-10-CM | POA: Diagnosis not present

## 2015-06-13 DIAGNOSIS — J90 Pleural effusion, not elsewhere classified: Secondary | ICD-10-CM

## 2015-06-13 NOTE — Progress Notes (Signed)
[name]       [place] [address]             [phone]      Joel Miller [place] Medical Record #[id number] Date of Birth: Jul 10, 1956  Referring: Joel Pier, MD Primary Care: Joel Richards, MD  Chief Complaint:   POST OP FOLLOW UP 09/05/2014 PREOPERATIVE DIAGNOSIS: Carcinoma of the distal third of the esophagus. 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 SURGICAL PROCEDURE: Video bronchoscopy, transhiatal total esophagectomy with cervical esophagogastrostomy, pyloroplasty, and feeding jejunostomy. SURGEON: [name], MD  Carcinoma of distal third of esophagus   Staging form: Esophagus - Adenocarcinoma, AJCC 7th Edition     Clinical: TX, N2 - Unsigned     Pathologic stage from 09/10/2014: Stage IIIB (yT3, N2, cM0, G2 - Moderately well differentiated) - Signed by Joel Isaac, MD on 09/11/2014  05/24/2015  OPERATIVE REPORT PREOPERATIVE DIAGNOSIS: Probable malignant effusion related to esophageal cancer on the right, recurrent. 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 SURGICAL PROCEDURE: Both fluoroscopic and ultrasound guidance, placement of a right PleurX catheter, and drainage of right pleural fluid. SURGEON: [name], MD     History of Present Illness:     Since seen in June of patient has significantly deteriorated he notes over the past 2 weeks a 15 pound weight loss poor appetite lites lack of energy. He notes that he is able to swallow without difficulty but does not feel eating is worth the effort. In late August he noted increasing cough. CT scan showed significant whiteout of the right chest since last week he has had thoracentesis 2, each time 1500 ML's is removed. So far path on the first thoracentesis shows atypical cells but is not diagnostic of cancer. CT scan of the chest suggests recurrent  tumor in the right pleura and mediastinal nodes.  Right pleurx catheter was place 9/16. And patient started  on Taxol/Herceptin weekly. He feels better this week then two weeks ago   he is draining approximate 800 mL every other day  From the right pleural Pleurx catheter   Past Medical History  Diagnosis Date  . Hyperlipidemia   . Obesity     lost 80 lbs  . Hypogonadism male   . Vitamin D deficiency   . Esophageal cancer ([place]) 05/18/14    Distal  . Allergy     HEY FEVER  . GERD (gastroesophageal reflux disease)     pt feels it was related to cancer  . S/P radiation therapy 06/11/2014-07/18/2014  . Hypertension     no med in 20months due to weight loss  . Diabetes mellitus without complication ([place])     type II no med 60 days   . Pleural effusion      History  Smoking status  . Former Smoker -- 0.25 packs/day for 3 years  . Types: Cigars  Smokeless tobacco  . Never Used    Comment: no cigars for one year    History  Alcohol Use  . 1.2 oz/week  . 2 Standard drinks or equivalent per week    Comment: social     Allergies  Allergen Reactions  . Ace Inhibitors     cough    Current Outpatient Prescriptions  Medication Sig Dispense Refill  . aspirin 81 MG tablet Take 81 mg by mouth daily.    . Cholecalciferol (VITAMIN D PO) Take 10,000  Units by mouth daily.    . mirtazapine (REMERON SOL-TAB) 15 MG disintegrating tablet Take 1 tablet (15 mg total) by mouth at bedtime. 30 tablet 1  . Multiple Vitamin (MULTIVITAMIN) capsule Take 1 capsule by mouth daily.    . Naproxen Sodium 220 MG CAPS Take 1 capsule by mouth daily as needed (pain).    Marland Kitchen oxazepam (SERAX) 15 MG capsule TAKE 1 TO 2 CAPSULES BY MOUTH 1 HOUR PRIOR TO BEDTIME FOR SLEEP IF NEEDED. *STOP TAKING ZOLPIDEM* 60 capsule 2  . oxyCODONE-acetaminophen (PERCOCET/ROXICET) 5-325 MG tablet Take 1 tablet by mouth every 4 (four) hours as needed for severe pain. 60 tablet 0  . predniSONE (DELTASONE) 10 MG tablet Take 1 tablet  (10 mg total) by mouth daily with breakfast. 30 tablet 1  . prochlorperazine (COMPAZINE) 10 MG tablet Take 1 tablet (10 mg total) by mouth every 6 (six) hours as needed for nausea. 30 tablet 1   No current facility-administered medications for this visit.   Wt Readings from Last 3 Encounters:  06/13/15 170 lb (77.111 kg)  06/10/15 170 lb (77.111 kg)  06/03/15 173 lb 14.4 oz (78.881 kg)   Physical Exam: BP 122/87 mmHg  Pulse 111  Resp 16  Ht 5\' 10"  (1.778 m)  Wt 170 lb (77.111 kg)  BMI 24.39 kg/m2  SpO2 97%  General appearance: alert, cooperative and appears stated age Neurologic: intact Heart: regular rate and rhythm, S1, S2 normal, no murmur, click, rub or gallop Lungs: clear to auscultation on the left but decreased at the right base Abdomen: soft, non-tender; bowel sounds normal; no masses,  no organomegaly and Abdominal wound is healing well Extremities: extremities normal, atraumatic, no cyanosis or edema and Homans sign is negative, no sign of DVT Wound: Left neck incision is healing well I do not appreciate any cervical or supraclavicular adenopathy  Pleurx catheter is intact  Diagnostic Studies & Laboratory data:     Recent Radiology Findings:  Dg Chest 2 View  06/13/2015   CLINICAL DATA:  History of esophageal malignancy with surgery in January of 2016, history of pleural effusions, currently asymptomatic.  EXAM: CHEST  2 VIEW  COMPARISON:  Portable chest x-ray of May 24, 2015  FINDINGS: There remains a smaller moderate-sized right pleural effusion. The small caliber right chest tube is unchanged in position at the inferior aspect of the pleural space. A small left pleural effusion has increased in size since September. There is no pneumothorax. The pulmonary interstitial markings are slightly more conspicuous today especially on the left. The heart is normal in size. The pulmonary vascularity is not engorged. The bony thorax exhibits no acute abnormality.  IMPRESSION:  1. Mildly increased pulmonary interstitial markings especially on the left since the previous study. This may reflect interstitial edema or early interstitial pneumonia. 2. Stable small a moderate sized right pleural effusion with stable small caliber chest tube. Slight interval increase in the volume of the small left pleural effusion.   Electronically Signed   By: David  Martinique M.D.   On: 06/13/2015 14:22   Ct Angio Chest Pe W/cm &/or Wo Cm  05/14/2015   CLINICAL DATA:  Cough and shortness of breath for 1 week. History of esophageal carcinoma diagnosed in the fall of 2015.  EXAM: CT ANGIOGRAPHY CHEST WITH CONTRAST  TECHNIQUE: Multidetector CT imaging of the chest was performed using the standard protocol during bolus administration of intravenous contrast. Multiplanar CT image reconstructions and MIPs were obtained to evaluate the vascular anatomy.  CONTRAST:  80 mL OMNIPAQUE IOHEXOL 350 MG/ML SOLN  COMPARISON:  CT chest and abdomen 01/31/2015 and PA and lateral chest 04/25/2015.  FINDINGS: No pulmonary embolus is identified. The patient has a massive right pleural effusion which has markedly increased since the prior plain films. A smaller left pleural effusion is identified. There is no pericardial effusion. Heart size is normal.  Postoperative change of esophagectomy and gastric pull-through is again seen. There is new mediastinal lymphadenopathy. Index AP window node on image 34 measures 1.7 cm in diameter. A prevascular node on image 33 is barely perceptible on the prior examination and now measures 1.0 cm short axis dimension on image 33. There is also new and extensive juxtaphrenic lymphadenopathy best seen on the right. Index node on image 79 measures 3.1 x 2.3 cm. Pleural thickening and nodularity are identified on the right most consistent with metastatic disease.  The right lung is nearly completely collapsed secondary to compressive atelectasis from the patient's effusion. A new left lower lobe  pulmonary nodule image 60 measures 0.7 cm. A second new left lower lobe pulmonary nodule on image 62 measures 0.9 cm in diameter. A left upper lobe nodule on image 39 measures 0.5 cm in diameter. Several additional smaller nodules are seen in the left lung.  Imaged upper abdomen demonstrates a new left adrenal nodule measuring 2.9 x 1.7 cm on image 95. There is some haziness of the mesenteric about the pancreas which is also new since the prior examination. Imaged upper abdomen is otherwise unremarkable. No lytic or sclerotic bony lesions identified.  Review of the MIP images confirms the above findings.  IMPRESSION: Negative for pulmonary embolus.  Findings consistent with extensive new metastatic esophageal carcinoma with a very large pleural effusion on the right with pleural nodularity consistent with a malignant effusion. The effusion nearly completely compresses the left lung. There is also new mediastinal and juxtaphrenic lymphadenopathy, left pulmonary nodules and a left adrenal nodule all most consistent with metastatic disease.  Small left pleural effusion.  Stranding of fat about the pancreas is nonspecific and could be due to pancreatitis or mesenteritis.   Electronically Signed   By: Inge Rise M.D.   On: 05/14/2015 10:58   Ct Chest W Contrast  02/01/2015   CLINICAL DATA:  Patient with distal esophageal carcinoma diagnosed 05/18/2014. Status post chemotherapy and radiation. Patient status post esophagectomy 09/05/2014.  EXAM: CT CHEST AND ABDOMEN WITHOUT CONTRAST  TECHNIQUE: Multidetector CT imaging of the chest and abdomen was performed following the standard protocol without intravenous contrast.  COMPARISON:  CT chest 08/09/2014 ; PET-CT 05/29/2014  FINDINGS: CT CHEST FINDINGS  Mediastinum/Nodes: Right anterior chest wall Port-A-Cath is present with tip terminating at the superior cavoatrial junction. No enlarged axillary, mediastinal or hilar lymphadenopathy. Normal heart size. No  pericardial effusion. Postoperative changes compatible with esophagectomy and gastric pull-through. No mediastinal fluid collections are identified.  Lungs/Pleura: Central airways are patent. Minimal scarring and or atelectasis medial right lower lobe (image 38; series 4). No large nodular or consolidative pulmonary opacity. Minimal tiny 1 mm nodules within the peripheral lungs bilaterally are unchanged from prior. No pleural effusion or pneumothorax.  Musculoskeletal: Thoracic spine degenerative changes without aggressive or acute appearing osseous lesions.  CT ABDOMEN FINDINGS  Hepatobiliary: Liver is normal in size and contour without focal hepatic lesion identified. Gallbladder is unremarkable. No intrahepatic or extrahepatic biliary ductal dilatation.  Pancreas: Unremarkable  Spleen: Unremarkable  Adrenals/Urinary Tract: Stable thickening of the bilateral adrenal glands. Kidneys enhance symmetrically  with contrast. No hydronephrosis.  Stomach/Bowel: Postoperative changes compatible with esophagectomy and gastric pull-through. No evidence for abnormal bowel wall thickening or bowel obstruction. There is nonspecific mesenteric fat stranding within the visualized upper abdomen. No free intraperitoneal air or fluid.  Vascular/Lymphatic: Normal caliber abdominal aorta. No retroperitoneal lymphadenopathy.  Other: Postsurgical changes midline abdominal soft tissues.  Musculoskeletal: Lower lumbar spine degenerative changes. No aggressive or acute appearing osseous lesions.  IMPRESSION: Patient status post interval esophagectomy and pull-through without evidence for locally recurrent or distant metastatic disease.  Nonspecific mesenteric fat stranding within the visualized upper abdomen is favored to be postoperative in etiology.   Electronically Signed   By: Lovey Newcomer M.D.   On: 02/01/2015 08:34       Recent Lab Findings: Lab Results  Component Value Date   WBC 9.1 06/10/2015   HGB 14.2 06/10/2015   HCT  41.5 06/10/2015   PLT 269 06/10/2015   GLUCOSE 182* 06/03/2015   CHOL 134 04/25/2015   TRIG 72 04/25/2015   HDL 41 04/25/2015   LDLCALC 79 04/25/2015   ALT 18 06/03/2015   AST 26 06/03/2015   NA 129* 06/03/2015   K 5.1 06/03/2015   CL 95* 05/24/2015   CREATININE 0.9 06/03/2015   BUN 21.6 06/03/2015   CO2 22 06/03/2015   TSH 1.106 04/25/2015   INR 1.30 05/24/2015   HGBA1C 5.9* 04/25/2015      Assessment / Plan:      Adenocarcinoma the esophagus/GE junction-status post an transhiatal total esophagectomy for a stage IIIB adenocarcinoma, now with what appears to be a malignant right pleural effusion and CT evidence of more diffuse disease.  To control the patient's effusion placed  a right Pleurx catheter.    will consider tablet per tube when the drainage decreases plan see the patient back in 3 weeks with a follow-up chest x-ray  Joel Isaac MD      [address] [place] [address] Office [phone]   Beeper [phone]  06/13/2015 3:32 PM

## 2015-06-13 NOTE — Telephone Encounter (Signed)
Oncology Nurse Navigator Documentation  Oncology Nurse Navigator Flowsheets 06/13/2015  Navigator Encounter Type Telephone  Patient Visit Type -  Treatment Phase -  Barriers/Navigation Needs Family concerns--per discussion on 10/3  Education Informed of Dr. Gearldine Shown answers to his questions  Interventions Education Method  Education Method Verbal  Support Groups/Services -  Time Spent with Patient 5

## 2015-06-15 ENCOUNTER — Other Ambulatory Visit: Payer: Self-pay | Admitting: Oncology

## 2015-06-17 ENCOUNTER — Other Ambulatory Visit (HOSPITAL_BASED_OUTPATIENT_CLINIC_OR_DEPARTMENT_OTHER): Payer: BLUE CROSS/BLUE SHIELD

## 2015-06-17 ENCOUNTER — Ambulatory Visit (HOSPITAL_BASED_OUTPATIENT_CLINIC_OR_DEPARTMENT_OTHER): Payer: BLUE CROSS/BLUE SHIELD | Admitting: Nurse Practitioner

## 2015-06-17 ENCOUNTER — Telehealth: Payer: Self-pay | Admitting: Oncology

## 2015-06-17 ENCOUNTER — Ambulatory Visit (HOSPITAL_BASED_OUTPATIENT_CLINIC_OR_DEPARTMENT_OTHER): Payer: BLUE CROSS/BLUE SHIELD

## 2015-06-17 ENCOUNTER — Other Ambulatory Visit: Payer: BLUE CROSS/BLUE SHIELD

## 2015-06-17 VITALS — BP 147/87 | HR 96 | Temp 97.8°F | Resp 18 | Ht 70.0 in | Wt 169.6 lb

## 2015-06-17 DIAGNOSIS — Z5111 Encounter for antineoplastic chemotherapy: Secondary | ICD-10-CM

## 2015-06-17 DIAGNOSIS — C155 Malignant neoplasm of lower third of esophagus: Secondary | ICD-10-CM

## 2015-06-17 DIAGNOSIS — Z5112 Encounter for antineoplastic immunotherapy: Secondary | ICD-10-CM | POA: Diagnosis not present

## 2015-06-17 DIAGNOSIS — J90 Pleural effusion, not elsewhere classified: Secondary | ICD-10-CM

## 2015-06-17 DIAGNOSIS — C159 Malignant neoplasm of esophagus, unspecified: Secondary | ICD-10-CM

## 2015-06-17 DIAGNOSIS — C160 Malignant neoplasm of cardia: Secondary | ICD-10-CM | POA: Diagnosis not present

## 2015-06-17 DIAGNOSIS — E119 Type 2 diabetes mellitus without complications: Secondary | ICD-10-CM | POA: Diagnosis not present

## 2015-06-17 DIAGNOSIS — C7972 Secondary malignant neoplasm of left adrenal gland: Secondary | ICD-10-CM

## 2015-06-17 DIAGNOSIS — C61 Malignant neoplasm of prostate: Secondary | ICD-10-CM

## 2015-06-17 LAB — COMPREHENSIVE METABOLIC PANEL (CC13)
ALK PHOS: 126 U/L (ref 40–150)
ALT: 14 U/L (ref 0–55)
ANION GAP: 9 meq/L (ref 3–11)
AST: 18 U/L (ref 5–34)
Albumin: 2.6 g/dL — ABNORMAL LOW (ref 3.5–5.0)
BILIRUBIN TOTAL: 0.49 mg/dL (ref 0.20–1.20)
BUN: 14.6 mg/dL (ref 7.0–26.0)
CO2: 25 meq/L (ref 22–29)
Calcium: 9.3 mg/dL (ref 8.4–10.4)
Chloride: 98 mEq/L (ref 98–109)
Creatinine: 0.9 mg/dL (ref 0.7–1.3)
EGFR: >90 mL/min/{1.73_m2} (ref >90)
Glucose: 146 mg/dl — ABNORMAL HIGH (ref 70–140)
Potassium: 4.5 mEq/L (ref 3.5–5.1)
Sodium: 132 mEq/L — ABNORMAL LOW (ref 136–145)
TOTAL PROTEIN: 6.1 g/dL — AB (ref 6.4–8.3)

## 2015-06-17 LAB — CBC WITH DIFFERENTIAL/PLATELET
BASO%: 0.4 % (ref 0.0–2.0)
BASOS ABS: 0 10*3/uL (ref 0.0–0.1)
EOS%: 1.4 % (ref 0.0–7.0)
Eosinophils Absolute: 0.1 10*3/uL (ref 0.0–0.5)
HCT: 39.4 % (ref 38.4–49.9)
HGB: 13.3 g/dL (ref 13.0–17.1)
LYMPH%: 5.1 % — AB (ref 14.0–49.0)
MCH: 30 pg (ref 27.2–33.4)
MCHC: 33.8 g/dL (ref 32.0–36.0)
MCV: 88.7 fL (ref 79.3–98.0)
MONO#: 0.3 10*3/uL (ref 0.1–0.9)
MONO%: 5.9 % (ref 0.0–14.0)
NEUT#: 4.3 10*3/uL (ref 1.5–6.5)
NEUT%: 87.2 % — AB (ref 39.0–75.0)
Platelets: 242 10*3/uL (ref 140–400)
RBC: 4.44 10*6/uL (ref 4.20–5.82)
RDW: 14.2 % (ref 11.0–14.6)
WBC: 5 10*3/uL (ref 4.0–10.3)
lymph#: 0.3 10*3/uL — ABNORMAL LOW (ref 0.9–3.3)

## 2015-06-17 MED ORDER — SODIUM CHLORIDE 0.9 % IV SOLN
80.0000 mg/m2 | Freq: Once | INTRAVENOUS | Status: AC
  Administered 2015-06-17: 156 mg via INTRAVENOUS
  Filled 2015-06-17: qty 26

## 2015-06-17 MED ORDER — SODIUM CHLORIDE 0.9 % IV SOLN
Freq: Once | INTRAVENOUS | Status: AC
  Administered 2015-06-17: 12:00:00 via INTRAVENOUS

## 2015-06-17 MED ORDER — DIPHENHYDRAMINE HCL 50 MG/ML IJ SOLN
25.0000 mg | Freq: Once | INTRAMUSCULAR | Status: AC
  Administered 2015-06-17: 25 mg via INTRAVENOUS

## 2015-06-17 MED ORDER — OXYCODONE-ACETAMINOPHEN 5-325 MG PO TABS: 1.0000 | ORAL_TABLET | ORAL | Status: DC | PRN

## 2015-06-17 MED ORDER — ACETAMINOPHEN 325 MG PO TABS
650.0000 mg | ORAL_TABLET | Freq: Once | ORAL | Status: AC
  Administered 2015-06-17: 650 mg via ORAL

## 2015-06-17 MED ORDER — SODIUM CHLORIDE 0.9 % IV SOLN
Freq: Once | INTRAVENOUS | Status: AC
  Administered 2015-06-17: 13:00:00 via INTRAVENOUS
  Filled 2015-06-17: qty 4

## 2015-06-17 MED ORDER — TRASTUZUMAB CHEMO INJECTION 440 MG
2.0000 mg/kg | Freq: Once | INTRAVENOUS | Status: AC
  Administered 2015-06-17: 147 mg via INTRAVENOUS
  Filled 2015-06-17: qty 7

## 2015-06-17 MED ORDER — FAMOTIDINE IN NACL 20-0.9 MG/50ML-% IV SOLN
INTRAVENOUS | Status: AC
Start: 2015-06-17 — End: 2015-06-17
  Filled 2015-06-17: qty 50

## 2015-06-17 MED ORDER — FAMOTIDINE IN NACL 20-0.9 MG/50ML-% IV SOLN
20.0000 mg | Freq: Once | INTRAVENOUS | Status: AC
  Administered 2015-06-17: 20 mg via INTRAVENOUS

## 2015-06-17 MED ORDER — DIPHENHYDRAMINE HCL 50 MG/ML IJ SOLN
INTRAMUSCULAR | Status: AC
  Filled 2015-06-17: qty 1

## 2015-06-17 MED ORDER — ACETAMINOPHEN 325 MG PO TABS
ORAL_TABLET | ORAL | Status: AC
  Filled 2015-06-17: qty 2

## 2015-06-17 NOTE — Progress Notes (Signed)
  [place] OFFICE PROGRESS NOTE   Diagnosis:  Gastroesophageal carcinoma  INTERVAL HISTORY:   Joel Miller returns as scheduled. He completed week 2 Taxol/Herceptin 06/10/2015. He had a single episode of nausea/vomiting. No mouth sores. No diarrhea or constipation. No change in baseline neuropathy symptoms involving the hands more so than the feet. He drains the Pleurx periodically. No real schedule. Shortness of breath is stable to decreased. He takes one half of a Percocet tablet about every 3 hours for pain and coughing.  Objective:  Vital signs in last 24 hours:  Blood pressure 147/87, pulse 96, temperature 97.8 F (36.6 C), temperature source Oral, resp. rate 18, height $RemoveBe'5\' 10"'aiovexsFH$  (1.778 m), weight 169 lb 9.6 oz (76.93 kg), SpO2 100 %.    HEENT: No thrush or ulcers. Resp: Lungs are clear. Breath sounds are diminished at the bases. Right Pleurx. Cardio: Regular rate and rhythm. GI: Abdomen soft and nontender. No hepatomegaly. Vascular: No leg edema.    Lab Results:  Lab Results  Component Value Date   WBC 5.0 06/17/2015   HGB 13.3 06/17/2015   HCT 39.4 06/17/2015   MCV 88.7 06/17/2015   PLT 242 06/17/2015   NEUTROABS 4.3 06/17/2015    Imaging:  No results found.  Medications: I have reviewed the patient's current medications.  Assessment/Plan: 1. Adenocarcinoma the esophagus/GE junction-status post an endoscopy 05/18/2014 confirming a gastric cardia/lower esophageal mass with tumor extending proximally in the esophagus to 25 cm from the incisors  HER-2/neu amplified   Staging CT scan 05/22/2014 with indeterminate bilateral pulmonary nodules; and prominent paraesophageal, porta hepatis, and para-aortic lymph nodes   Staging PET scan 05/29/2014 confirmed hypermetabolic soft tissue thickening at the distal third of the esophagus extending into the proximal stomach. 2 additional smaller areas of hypermetabolism or noted in the more proximal esophagus  with a mildly enlarged hypermetabolic gastrohepatic node.   Initiation of radiation and concurrent Taxol/carboplatin 06/11/2014; Taxol/carboplatin completed 07/09/2014; radiation completed 07/18/2014.  Esophagectomy/jejunostomy feeding tube placement 09/05/2014 with the pathology revealing aypT3ypN2 tumor, HER-2/neu amplified, positive distal resection margin   Cycle 1 FOLFOX 11/01/2014  Cycle 2 FOLFOX 11/15/2014  Cycle 3 FOLFOX 11/29/2014  Cycle 4 FOLFOX 12/13/2014  Cycle 5 FOLFOX 12/27/2014  Cycle 6 FOLFOX 01/17/2015  CTs of the chest, abdomen, and pelvis on 02/01/2015-negative for recurrent esophagus cancer  CT of the chest 05/14/2015 revealed a large right pleural effusion,small left effusion, left lung nodules, mediastinal lymphadenopathy, and a left adrenal metastasis  Placement of a right Pleurx catheter 05/24/2015  Echocardiogram 05/31/2015-LVEF 60-65%  Cycle 1 Taxol/Herceptin 06/04/2015  Cycle 2 Taxol/Herceptin 06/10/2015  Cycle 3 Taxol/Herceptin 06/17/2015  2. History of anorexia/weight loss 3. History of solid dysphagia -improved 4. Diabetes  5. Hypertension  6. Hyperlipidemia  7. Back pain-potentially related to chronic spine disease versus metastases. He continues Percocet as needed    Disposition: Joel Miller appears stable. He has completed 2 cycles of weekly Taxol/Herceptin. Plan to proceed with cycle 3 today as scheduled. He will return for cycle 4 in one week. He will return for a follow-up visit and cycle 5 Taxol/Herceptin on 07/08/2015. He will contact the office in the interim with any problems.  Plan reviewed with Dr. Benay Spice.    [name] ANP/GNP-BC   06/17/2015  10:52 AM

## 2015-06-17 NOTE — Telephone Encounter (Signed)
Gave and printed appt sched and avs fo rpt for OCT and NOV  °

## 2015-06-17 NOTE — Progress Notes (Signed)
Ok to treat without CMET today per [name], NP

## 2015-06-17 NOTE — Patient Instructions (Signed)
[facility] Discharge Instructions for Patients Receiving Chemotherapy  Today you received the following chemotherapy agents taxol/herceptin.   To help prevent nausea and vomiting after your treatment, we encourage you to take your nausea medication as directed.  If you develop nausea and vomiting that is not controlled by your nausea medication, call the clinic.   BELOW ARE SYMPTOMS THAT SHOULD BE REPORTED IMMEDIATELY:  *FEVER GREATER THAN 100.5 F  *CHILLS WITH OR WITHOUT FEVER  NAUSEA AND VOMITING THAT IS NOT CONTROLLED WITH YOUR NAUSEA MEDICATION  *UNUSUAL SHORTNESS OF BREATH  *UNUSUAL BRUISING OR BLEEDING  TENDERNESS IN MOUTH AND THROAT WITH OR WITHOUT PRESENCE OF ULCERS  *URINARY PROBLEMS  *BOWEL PROBLEMS  UNUSUAL RASH Items with * indicate a potential emergency and should be followed up as soon as possible.  Feel free to call the clinic you have any questions or concerns. The clinic phone number is [phone].  

## 2015-06-23 ENCOUNTER — Other Ambulatory Visit: Payer: Self-pay | Admitting: Oncology

## 2015-06-24 ENCOUNTER — Other Ambulatory Visit (HOSPITAL_BASED_OUTPATIENT_CLINIC_OR_DEPARTMENT_OTHER): Payer: BLUE CROSS/BLUE SHIELD

## 2015-06-24 ENCOUNTER — Ambulatory Visit (HOSPITAL_BASED_OUTPATIENT_CLINIC_OR_DEPARTMENT_OTHER): Payer: BLUE CROSS/BLUE SHIELD

## 2015-06-24 VITALS — BP 129/84 | HR 109 | Temp 98.9°F | Resp 20

## 2015-06-24 DIAGNOSIS — C160 Malignant neoplasm of cardia: Secondary | ICD-10-CM

## 2015-06-24 DIAGNOSIS — Z5112 Encounter for antineoplastic immunotherapy: Secondary | ICD-10-CM | POA: Diagnosis not present

## 2015-06-24 DIAGNOSIS — C155 Malignant neoplasm of lower third of esophagus: Secondary | ICD-10-CM | POA: Diagnosis not present

## 2015-06-24 DIAGNOSIS — J90 Pleural effusion, not elsewhere classified: Secondary | ICD-10-CM

## 2015-06-24 DIAGNOSIS — Z5111 Encounter for antineoplastic chemotherapy: Secondary | ICD-10-CM

## 2015-06-24 DIAGNOSIS — C61 Malignant neoplasm of prostate: Secondary | ICD-10-CM

## 2015-06-24 DIAGNOSIS — C159 Malignant neoplasm of esophagus, unspecified: Secondary | ICD-10-CM

## 2015-06-24 LAB — CBC WITH DIFFERENTIAL/PLATELET
BASO%: 0.3 % (ref 0.0–2.0)
Basophils Absolute: 0 10*3/uL (ref 0.0–0.1)
EOS%: 0.3 % (ref 0.0–7.0)
Eosinophils Absolute: 0 10*3/uL (ref 0.0–0.5)
HEMATOCRIT: 32.5 % — AB (ref 38.4–49.9)
HGB: 10.9 g/dL — ABNORMAL LOW (ref 13.0–17.1)
LYMPH%: 5.5 % — AB (ref 14.0–49.0)
MCH: 29.9 pg (ref 27.2–33.4)
MCHC: 33.5 g/dL (ref 32.0–36.0)
MCV: 89 fL (ref 79.3–98.0)
MONO#: 0.7 10*3/uL (ref 0.1–0.9)
MONO%: 11.4 % (ref 0.0–14.0)
NEUT#: 4.8 10*3/uL (ref 1.5–6.5)
NEUT%: 82.5 % — AB (ref 39.0–75.0)
Platelets: 298 10*3/uL (ref 140–400)
RBC: 3.65 10*6/uL — ABNORMAL LOW (ref 4.20–5.82)
RDW: 15.3 % — ABNORMAL HIGH (ref 11.0–14.6)
WBC: 5.9 10*3/uL (ref 4.0–10.3)
lymph#: 0.3 10*3/uL — ABNORMAL LOW (ref 0.9–3.3)
nRBC: 0 % (ref 0–0)

## 2015-06-24 MED ORDER — SODIUM CHLORIDE 0.9 % IV SOLN
2.0000 mg/kg | Freq: Once | INTRAVENOUS | Status: AC
  Administered 2015-06-24: 147 mg via INTRAVENOUS
  Filled 2015-06-24: qty 7

## 2015-06-24 MED ORDER — DIPHENHYDRAMINE HCL 50 MG/ML IJ SOLN
INTRAMUSCULAR | Status: AC
  Filled 2015-06-24: qty 1

## 2015-06-24 MED ORDER — FAMOTIDINE IN NACL 20-0.9 MG/50ML-% IV SOLN
20.0000 mg | Freq: Once | INTRAVENOUS | Status: AC
  Administered 2015-06-24: 20 mg via INTRAVENOUS

## 2015-06-24 MED ORDER — ACETAMINOPHEN 325 MG PO TABS
ORAL_TABLET | ORAL | Status: AC
  Filled 2015-06-24: qty 2

## 2015-06-24 MED ORDER — SODIUM CHLORIDE 0.9 % IV SOLN
80.0000 mg/m2 | Freq: Once | INTRAVENOUS | Status: AC
  Administered 2015-06-24: 156 mg via INTRAVENOUS
  Filled 2015-06-24: qty 26

## 2015-06-24 MED ORDER — DIPHENHYDRAMINE HCL 50 MG/ML IJ SOLN
25.0000 mg | Freq: Once | INTRAMUSCULAR | Status: AC
  Administered 2015-06-24: 25 mg via INTRAVENOUS

## 2015-06-24 MED ORDER — FAMOTIDINE IN NACL 20-0.9 MG/50ML-% IV SOLN
INTRAVENOUS | Status: AC
  Filled 2015-06-24: qty 50

## 2015-06-24 MED ORDER — ACETAMINOPHEN 325 MG PO TABS
650.0000 mg | ORAL_TABLET | Freq: Once | ORAL | Status: AC
  Administered 2015-06-24: 650 mg via ORAL

## 2015-06-24 MED ORDER — SODIUM CHLORIDE 0.9 % IV SOLN
Freq: Once | INTRAVENOUS | Status: AC
  Administered 2015-06-24: 10:00:00 via INTRAVENOUS

## 2015-06-24 MED ORDER — PACLITAXEL CHEMO INJECTION 300 MG/50ML: 80.0000 mg/m2 | Freq: Once | INTRAVENOUS | Status: DC

## 2015-06-24 MED ORDER — SODIUM CHLORIDE 0.9 % IV SOLN
Freq: Once | INTRAVENOUS | Status: AC
  Administered 2015-06-24: 11:00:00 via INTRAVENOUS
  Filled 2015-06-24: qty 4

## 2015-06-24 NOTE — Patient Instructions (Signed)
[facility] Discharge Instructions for Patients Receiving Chemotherapy  Today you received the following chemotherapy agents taxol/herceptin.   To help prevent nausea and vomiting after your treatment, we encourage you to take your nausea medication as directed.  If you develop nausea and vomiting that is not controlled by your nausea medication, call the clinic.   BELOW ARE SYMPTOMS THAT SHOULD BE REPORTED IMMEDIATELY:  *FEVER GREATER THAN 100.5 F  *CHILLS WITH OR WITHOUT FEVER  NAUSEA AND VOMITING THAT IS NOT CONTROLLED WITH YOUR NAUSEA MEDICATION  *UNUSUAL SHORTNESS OF BREATH  *UNUSUAL BRUISING OR BLEEDING  TENDERNESS IN MOUTH AND THROAT WITH OR WITHOUT PRESENCE OF ULCERS  *URINARY PROBLEMS  *BOWEL PROBLEMS  UNUSUAL RASH Items with * indicate a potential emergency and should be followed up as soon as possible.  Feel free to call the clinic you have any questions or concerns. The clinic phone number is [phone].  

## 2015-06-24 NOTE — Progress Notes (Signed)
OK to treat with CBC only today per Dr. Benay Spice.

## 2015-06-26 ENCOUNTER — Telehealth: Payer: Self-pay | Admitting: *Deleted

## 2015-06-26 ENCOUNTER — Telehealth: Payer: Self-pay | Admitting: Nurse Practitioner

## 2015-06-26 NOTE — Telephone Encounter (Signed)
I returned Joel Miller call regarding the recurrent cough. His cough has been worse over the past day or so. He denies fever, shortness of breath. He notes less drainage from the Pleurx over the past week. He has Best boy at home. We reviewed the dosing instructions (100 mg every 8 hours as needed). He will contact the office if the cough persists or if he develops fever, shortness of breath and we will proceed with a chest x-ray.

## 2015-06-26 NOTE — Telephone Encounter (Signed)
Patient called stating that his hacking cough has restarted. Patient denies any other symptoms (green sputum/fever/SOB/). Patient would like something to help relieve his cough. Message forwarded to [name] NP.

## 2015-06-27 ENCOUNTER — Telehealth: Payer: Self-pay | Admitting: Nurse Practitioner

## 2015-06-27 LAB — AFB CULTURE WITH SMEAR (NOT AT ARMC): Acid Fast Smear: NONE SEEN

## 2015-06-27 NOTE — Telephone Encounter (Signed)
I returned Mr. Joel Miller call. The cough is better. He thinks the [employer] are helping. Temp was 99 yesterday and 98.6 today. He has noted mild bilateral ankle edema, not bothersome. He will keep his scheduled follow-up visit 07/08/2015.

## 2015-06-27 NOTE — Telephone Encounter (Signed)
Received a call from Mr. Vantol he stated Lattie Haw informed him to call her today explained to him Lattie Haw was seeing pt's and she would return his call. Lattie Haw informed

## 2015-07-01 ENCOUNTER — Telehealth: Payer: Self-pay | Admitting: *Deleted

## 2015-07-01 ENCOUNTER — Inpatient Hospital Stay (HOSPITAL_COMMUNITY)
Admission: AD | Admit: 2015-07-01 | Discharge: 2015-07-06 | DRG: 175 | Disposition: A | Discharge status: Home Health | Payer: BLUE CROSS/BLUE SHIELD | Source: Ambulatory Visit | Attending: Internal Medicine | Admitting: Internal Medicine

## 2015-07-01 ENCOUNTER — Other Ambulatory Visit (HOSPITAL_BASED_OUTPATIENT_CLINIC_OR_DEPARTMENT_OTHER): Payer: BLUE CROSS/BLUE SHIELD

## 2015-07-01 ENCOUNTER — Encounter (HOSPITAL_COMMUNITY): Payer: Self-pay | Admitting: *Deleted

## 2015-07-01 ENCOUNTER — Encounter: Payer: Self-pay | Admitting: Nurse Practitioner

## 2015-07-01 ENCOUNTER — Other Ambulatory Visit: Payer: Self-pay | Admitting: Cardiothoracic Surgery

## 2015-07-01 ENCOUNTER — Ambulatory Visit (HOSPITAL_BASED_OUTPATIENT_CLINIC_OR_DEPARTMENT_OTHER): Payer: BLUE CROSS/BLUE SHIELD | Admitting: Nurse Practitioner

## 2015-07-01 ENCOUNTER — Ambulatory Visit (HOSPITAL_COMMUNITY)
Admission: RE | Admit: 2015-07-01 | Discharge: 2015-07-01 | Disposition: A | Discharge status: Home / Self Care | Payer: BLUE CROSS/BLUE SHIELD | Source: Ambulatory Visit | Attending: Nurse Practitioner | Admitting: Nurse Practitioner

## 2015-07-01 VITALS — BP 127/96 | HR 130 | Temp 98.2°F | Resp 24 | Wt 176.2 lb

## 2015-07-01 DIAGNOSIS — C7800 Secondary malignant neoplasm of unspecified lung: Secondary | ICD-10-CM | POA: Diagnosis present

## 2015-07-01 DIAGNOSIS — C160 Malignant neoplasm of cardia: Secondary | ICD-10-CM | POA: Diagnosis not present

## 2015-07-01 DIAGNOSIS — C158 Malignant neoplasm of overlapping sites of esophagus: Secondary | ICD-10-CM | POA: Diagnosis not present

## 2015-07-01 DIAGNOSIS — E43 Unspecified severe protein-calorie malnutrition: Secondary | ICD-10-CM | POA: Diagnosis present

## 2015-07-01 DIAGNOSIS — Z7982 Long term (current) use of aspirin: Secondary | ICD-10-CM | POA: Diagnosis not present

## 2015-07-01 DIAGNOSIS — K219 Gastro-esophageal reflux disease without esophagitis: Secondary | ICD-10-CM | POA: Diagnosis present

## 2015-07-01 DIAGNOSIS — E860 Dehydration: Secondary | ICD-10-CM

## 2015-07-01 DIAGNOSIS — C7970 Secondary malignant neoplasm of unspecified adrenal gland: Secondary | ICD-10-CM | POA: Diagnosis present

## 2015-07-01 DIAGNOSIS — R1310 Dysphagia, unspecified: Secondary | ICD-10-CM | POA: Diagnosis present

## 2015-07-01 DIAGNOSIS — J918 Pleural effusion in other conditions classified elsewhere: Secondary | ICD-10-CM

## 2015-07-01 DIAGNOSIS — C155 Malignant neoplasm of lower third of esophagus: Secondary | ICD-10-CM

## 2015-07-01 DIAGNOSIS — E8809 Other disorders of plasma-protein metabolism, not elsewhere classified: Secondary | ICD-10-CM | POA: Insufficient documentation

## 2015-07-01 DIAGNOSIS — R112 Nausea with vomiting, unspecified: Secondary | ICD-10-CM

## 2015-07-01 DIAGNOSIS — R05 Cough: Secondary | ICD-10-CM | POA: Diagnosis present

## 2015-07-01 DIAGNOSIS — Z87891 Personal history of nicotine dependence: Secondary | ICD-10-CM

## 2015-07-01 DIAGNOSIS — I959 Hypotension, unspecified: Secondary | ICD-10-CM | POA: Diagnosis not present

## 2015-07-01 DIAGNOSIS — I319 Disease of pericardium, unspecified: Secondary | ICD-10-CM | POA: Diagnosis not present

## 2015-07-01 DIAGNOSIS — G4700 Insomnia, unspecified: Secondary | ICD-10-CM | POA: Diagnosis present

## 2015-07-01 DIAGNOSIS — D638 Anemia in other chronic diseases classified elsewhere: Secondary | ICD-10-CM | POA: Diagnosis not present

## 2015-07-01 DIAGNOSIS — Z8501 Personal history of malignant neoplasm of esophagus: Secondary | ICD-10-CM | POA: Diagnosis present

## 2015-07-01 DIAGNOSIS — I313 Pericardial effusion (noninflammatory): Secondary | ICD-10-CM | POA: Diagnosis present

## 2015-07-01 DIAGNOSIS — Z7952 Long term (current) use of systemic steroids: Secondary | ICD-10-CM | POA: Diagnosis not present

## 2015-07-01 DIAGNOSIS — Z9221 Personal history of antineoplastic chemotherapy: Secondary | ICD-10-CM

## 2015-07-01 DIAGNOSIS — E875 Hyperkalemia: Secondary | ICD-10-CM | POA: Diagnosis not present

## 2015-07-01 DIAGNOSIS — R059 Cough, unspecified: Secondary | ICD-10-CM | POA: Diagnosis present

## 2015-07-01 DIAGNOSIS — R609 Edema, unspecified: Secondary | ICD-10-CM

## 2015-07-01 DIAGNOSIS — I2699 Other pulmonary embolism without acute cor pulmonale: Secondary | ICD-10-CM | POA: Diagnosis present

## 2015-07-01 DIAGNOSIS — Z6824 Body mass index (BMI) 24.0-24.9, adult: Secondary | ICD-10-CM

## 2015-07-01 DIAGNOSIS — K760 Fatty (change of) liver, not elsewhere classified: Secondary | ICD-10-CM | POA: Diagnosis present

## 2015-07-01 DIAGNOSIS — Z923 Personal history of irradiation: Secondary | ICD-10-CM | POA: Diagnosis not present

## 2015-07-01 DIAGNOSIS — J90 Pleural effusion, not elsewhere classified: Secondary | ICD-10-CM | POA: Diagnosis present

## 2015-07-01 DIAGNOSIS — E871 Hypo-osmolality and hyponatremia: Secondary | ICD-10-CM | POA: Diagnosis not present

## 2015-07-01 DIAGNOSIS — D630 Anemia in neoplastic disease: Secondary | ICD-10-CM | POA: Diagnosis present

## 2015-07-01 DIAGNOSIS — I314 Cardiac tamponade: Secondary | ICD-10-CM | POA: Diagnosis not present

## 2015-07-01 DIAGNOSIS — R933 Abnormal findings on diagnostic imaging of other parts of digestive tract: Secondary | ICD-10-CM | POA: Diagnosis present

## 2015-07-01 DIAGNOSIS — K2100 Gastro-esophageal reflux disease with esophagitis, without bleeding: Secondary | ICD-10-CM | POA: Diagnosis present

## 2015-07-01 DIAGNOSIS — E785 Hyperlipidemia, unspecified: Secondary | ICD-10-CM | POA: Diagnosis present

## 2015-07-01 DIAGNOSIS — C159 Malignant neoplasm of esophagus, unspecified: Secondary | ICD-10-CM

## 2015-07-01 DIAGNOSIS — I480 Paroxysmal atrial fibrillation: Secondary | ICD-10-CM | POA: Diagnosis present

## 2015-07-01 DIAGNOSIS — E46 Unspecified protein-calorie malnutrition: Secondary | ICD-10-CM

## 2015-07-01 DIAGNOSIS — E119 Type 2 diabetes mellitus without complications: Secondary | ICD-10-CM | POA: Diagnosis present

## 2015-07-01 DIAGNOSIS — I82409 Acute embolism and thrombosis of unspecified deep veins of unspecified lower extremity: Secondary | ICD-10-CM | POA: Diagnosis present

## 2015-07-01 DIAGNOSIS — R7303 Prediabetes: Secondary | ICD-10-CM | POA: Diagnosis present

## 2015-07-01 DIAGNOSIS — I3139 Other pericardial effusion (noninflammatory): Secondary | ICD-10-CM | POA: Diagnosis present

## 2015-07-01 DIAGNOSIS — Z419 Encounter for procedure for purposes other than remedying health state, unspecified: Secondary | ICD-10-CM

## 2015-07-01 DIAGNOSIS — M7989 Other specified soft tissue disorders: Secondary | ICD-10-CM | POA: Diagnosis not present

## 2015-07-01 DIAGNOSIS — R600 Localized edema: Secondary | ICD-10-CM | POA: Insufficient documentation

## 2015-07-01 DIAGNOSIS — I4891 Unspecified atrial fibrillation: Secondary | ICD-10-CM | POA: Diagnosis not present

## 2015-07-01 LAB — CBC WITH DIFFERENTIAL/PLATELET
BASO%: 0.1 % (ref 0.0–2.0)
BASOS ABS: 0 10*3/uL (ref 0.0–0.1)
EOS ABS: 0 10*3/uL (ref 0.0–0.5)
EOS%: 0.1 % (ref 0.0–7.0)
HCT: 32.2 % — ABNORMAL LOW (ref 38.4–49.9)
HEMOGLOBIN: 10.8 g/dL — AB (ref 13.0–17.1)
LYMPH%: 2.9 % — AB (ref 14.0–49.0)
MCH: 29.9 pg (ref 27.2–33.4)
MCHC: 33.5 g/dL (ref 32.0–36.0)
MCV: 89.3 fL (ref 79.3–98.0)
MONO#: 0.5 10*3/uL (ref 0.1–0.9)
MONO%: 7.5 % (ref 0.0–14.0)
NEUT#: 6.5 10*3/uL (ref 1.5–6.5)
NEUT%: 89.4 % — AB (ref 39.0–75.0)
Platelets: 503 10*3/uL — ABNORMAL HIGH (ref 140–400)
RBC: 3.61 10*6/uL — AB (ref 4.20–5.82)
RDW: 15.7 % — ABNORMAL HIGH (ref 11.0–14.6)
WBC: 7.3 10*3/uL (ref 4.0–10.3)
lymph#: 0.2 10*3/uL — ABNORMAL LOW (ref 0.9–3.3)

## 2015-07-01 LAB — COMPREHENSIVE METABOLIC PANEL (CC13)
ALBUMIN: 2.4 g/dL — AB (ref 3.5–5.0)
ALK PHOS: 130 U/L (ref 40–150)
ALT: 22 U/L (ref 0–55)
AST: 29 U/L (ref 5–34)
Anion Gap: 10 mEq/L (ref 3–11)
BUN: 18.4 mg/dL (ref 7.0–26.0)
CHLORIDE: 97 meq/L — AB (ref 98–109)
CO2: 24 mEq/L (ref 22–29)
Calcium: 9.8 mg/dL (ref 8.4–10.4)
Creatinine: 1 mg/dL (ref 0.7–1.3)
EGFR: 86 mL/min/{1.73_m2} — AB (ref >90)
GLUCOSE: 164 mg/dL — AB (ref 70–140)
POTASSIUM: 5.2 meq/L — AB (ref 3.5–5.1)
SODIUM: 130 meq/L — AB (ref 136–145)
Total Bilirubin: 0.51 mg/dL (ref 0.20–1.20)
Total Protein: 6.4 g/dL (ref 6.4–8.3)

## 2015-07-01 LAB — GLUCOSE, CAPILLARY: Glucose-Capillary: 124 mg/dL — ABNORMAL HIGH (ref 65–99)

## 2015-07-01 MED ORDER — GUAIFENESIN ER 600 MG PO TB12
1200.0000 mg | ORAL_TABLET | Freq: Two times a day (BID) | ORAL | Status: DC | PRN
  Administered 2015-07-04: 1200 mg via ORAL
  Filled 2015-07-01 (×2): qty 2

## 2015-07-01 MED ORDER — OXAZEPAM 15 MG PO CAPS: 15.0000 mg | ORAL_CAPSULE | Freq: Every evening | ORAL | Status: DC | PRN

## 2015-07-01 MED ORDER — OXYCODONE-ACETAMINOPHEN 5-325 MG PO TABS
0.5000 | ORAL_TABLET | ORAL | Status: DC | PRN
  Administered 2015-07-01: 0.5 via ORAL
  Filled 2015-07-01: qty 1

## 2015-07-01 MED ORDER — LORAZEPAM 1 MG PO TABS: 1.0000 mg | ORAL_TABLET | Freq: Every evening | ORAL | Status: DC | PRN

## 2015-07-01 MED ORDER — OXAZEPAM 15 MG PO CAPS
15.0000 mg | ORAL_CAPSULE | Freq: Every evening | ORAL | Status: DC | PRN
Start: 2015-07-01 — End: 2015-07-02
  Administered 2015-07-01: 15 mg via ORAL

## 2015-07-01 MED ORDER — IOHEXOL 350 MG/ML SOLN
100.0000 mL | Freq: Once | INTRAVENOUS | Status: AC | PRN
  Administered 2015-07-01: 100 mL via INTRAVENOUS

## 2015-07-01 MED ORDER — SODIUM CHLORIDE 0.9 % IV SOLN
INTRAVENOUS | Status: DC
  Administered 2015-07-01: 16:00:00 via INTRAVENOUS

## 2015-07-01 MED ORDER — SODIUM CHLORIDE 0.9 % IJ SOLN
3.0000 mL | Freq: Two times a day (BID) | INTRAMUSCULAR | Status: DC
  Administered 2015-07-02 – 2015-07-05 (×4): 3 mL via INTRAVENOUS

## 2015-07-01 MED ORDER — SODIUM CHLORIDE 0.9 % IJ SOLN: 3.0000 mL | INTRAMUSCULAR | Status: DC | PRN

## 2015-07-01 MED ORDER — SODIUM CHLORIDE 0.9 % IJ SOLN
3.0000 mL | Freq: Two times a day (BID) | INTRAMUSCULAR | Status: DC
  Administered 2015-07-01 – 2015-07-05 (×3): 3 mL via INTRAVENOUS

## 2015-07-01 MED ORDER — ENOXAPARIN SODIUM 120 MG/0.8ML ~~LOC~~ SOLN
120.0000 mg | Freq: Once | SUBCUTANEOUS | Status: AC
  Administered 2015-07-01: 120 mg via SUBCUTANEOUS

## 2015-07-01 MED ORDER — BENZONATATE 100 MG PO CAPS: 200.0000 mg | ORAL_CAPSULE | ORAL | Status: DC | PRN

## 2015-07-01 MED ORDER — PREDNISONE 10 MG PO TABS
10.0000 mg | ORAL_TABLET | Freq: Every day | ORAL | Status: DC
  Administered 2015-07-02 – 2015-07-06 (×4): 10 mg via ORAL
  Filled 2015-07-01 (×3): qty 1
  Filled 2015-07-01: qty 2
  Filled 2015-07-01 (×2): qty 1

## 2015-07-01 MED ORDER — SODIUM CHLORIDE 0.9 % IV SOLN: 250.0000 mL | INTRAVENOUS | Status: DC | PRN

## 2015-07-01 MED ORDER — BENZONATATE 100 MG PO CAPS
200.0000 mg | ORAL_CAPSULE | Freq: Three times a day (TID) | ORAL | Status: DC | PRN
  Administered 2015-07-01: 200 mg via ORAL
  Filled 2015-07-01 (×2): qty 2

## 2015-07-01 MED ORDER — ONDANSETRON HCL 4 MG PO TABS: 4.0000 mg | ORAL_TABLET | Freq: Four times a day (QID) | ORAL | Status: DC | PRN

## 2015-07-01 MED ORDER — INSULIN ASPART 100 UNIT/ML ~~LOC~~ SOLN
0.0000 [IU] | Freq: Three times a day (TID) | SUBCUTANEOUS | Status: DC
  Administered 2015-07-02 (×2): 1 [IU] via SUBCUTANEOUS
  Administered 2015-07-03 – 2015-07-04 (×2): 2 [IU] via SUBCUTANEOUS
  Administered 2015-07-05 – 2015-07-06 (×4): 1 [IU] via SUBCUTANEOUS

## 2015-07-01 MED ORDER — ONDANSETRON HCL 4 MG/2ML IJ SOLN
4.0000 mg | Freq: Four times a day (QID) | INTRAMUSCULAR | Status: DC | PRN
Start: 2015-07-01 — End: 2015-07-06
  Administered 2015-07-02 – 2015-07-03 (×2): 4 mg via INTRAVENOUS
  Filled 2015-07-01: qty 2

## 2015-07-01 NOTE — Patient Instructions (Signed)
Enoxaparin injection °What is this medicine? °ENOXAPARIN (ee nox a PA rin) is used after knee, hip, or abdominal surgeries to prevent blood clotting. It is also used to treat existing blood clots in the lungs or in the veins. °This medicine may be used for other purposes; ask your health care provider or pharmacist if you have questions. °What should I tell my health care provider before I take this medicine? °They need to know if you have any of these conditions: °-bleeding disorders, hemorrhage, or hemophilia °-infection of the heart or heart valves °-kidney or liver disease °-previous stroke °-prosthetic heart valve °-recent surgery or delivery of a baby °-ulcer in the stomach or intestine, diverticulitis, or other bowel disease °-an unusual or allergic reaction to enoxaparin, heparin, pork or pork products, other medicines, foods, dyes, or preservatives °-pregnant or trying to get pregnant °-breast-feeding °How should I use this medicine? °This medicine is for injection under the skin. It is usually given by a health-care professional. You or a family member may be trained on how to give the injections. If you are to give yourself injections, make sure you understand how to use the syringe, measure the dose if necessary, and give the injection. To avoid bruising, do not rub the site where this medicine has been injected. Do not take your medicine more often than directed. Do not stop taking except on the advice of your doctor or health care professional. °Make sure you receive a puncture-resistant container to dispose of the needles and syringes once you have finished with them. Do not reuse these items. Return the container to your doctor or health care professional for proper disposal. °Talk to your pediatrician regarding the use of this medicine in children. Special care may be needed. °Overdosage: If you think you have taken too much of this medicine contact a poison control center or emergency room at  once. °NOTE: This medicine is only for you. Do not share this medicine with others. °What if I miss a dose? °If you miss a dose, take it as soon as you can. If it is almost time for your next dose, take only that dose. Do not take double or extra doses. °What may interact with this medicine? °-aspirin and aspirin-like medicines °-certain medicines that treat or prevent blood clots °-dipyridamole °-NSAIDs, medicines for pain and inflammation, like ibuprofen or naproxen °This list may not describe all possible interactions. Give your health care provider a list of all the medicines, herbs, non-prescription drugs, or dietary supplements you use. Also tell them if you smoke, drink alcohol, or use illegal drugs. Some items may interact with your medicine. °What should I watch for while using this medicine? °Visit your doctor or health care professional for regular checks on your progress. Your condition will be monitored carefully while you are receiving this medicine. °Notify your doctor or health care professional and seek emergency treatment if you develop breathing problems; changes in vision; chest pain; severe, sudden headache; pain, swelling, warmth in the leg; trouble speaking; sudden numbness or weakness of the face, arm, or leg. These can be signs that your condition has gotten worse. °If you are going to have surgery, tell your doctor or health care professional that you are taking this medicine. °Do not stop taking this medicine without first talking to your doctor. Be sure to refill your prescription before you run out of medicine. °Avoid sports and activities that might cause injury while you are using this medicine. Severe falls or injuries can   cause unseen bleeding. Be careful when using sharp tools or knives. Consider using an electric razor. Take special care brushing or flossing your teeth. Report any injuries, bruising, or red spots on the skin to your doctor or health care professional. °What side  effects may I notice from receiving this medicine? °Side effects that you should report to your doctor or health care professional as soon as possible: °-allergic reactions like skin rash, itching or hives, swelling of the face, lips, or tongue °-feeling faint or lightheaded, falls °-signs and symptoms of bleeding such as bloody or black, tarry stools; red or dark-brown urine; spitting up blood or brown material that looks like coffee grounds; red spots on the skin; unusual bruising or bleeding from the eye, gums, or nose °Side effects that usually do not require medical attention (report to your doctor or health care professional if they continue or are bothersome): °-pain, redness, or irritation at site where injected °This list may not describe all possible side effects. Call your doctor for medical advice about side effects. You may report side effects to FDA at 1-800-FDA-1088. °Where should I keep my medicine? °Keep out of the reach of children. °Store at room temperature between 15 and 30 degrees C (59 and 86 degrees F). Do not freeze. If your injections have been specially prepared, you may need to store them in the refrigerator. Ask your pharmacist. Throw away any unused medicine after the expiration date. °NOTE: This sheet is a summary. It may not cover all possible information. If you have questions about this medicine, talk to your doctor, pharmacist, or health care provider. °  °© 2016, [employer]. (2013-12-26 16:06:21) ° °

## 2015-07-01 NOTE — Assessment & Plan Note (Addendum)
He presents to the [facility] today with complaint of increasing coughing spasms to the point of vomiting whenever he drinks or eats almost anything.  He states he is now only able to eat small bites of banana without any issues.  Patient feels dehydrated today.  Patient received approx 750 mls normal saline IV fluid rehydration today at the cancer center; was also encouraged to push fluids is much as possible at home.

## 2015-07-01 NOTE — Assessment & Plan Note (Addendum)
Patient notes a one-week history of bilateral lower extremity edema.  He continues with complain of chronic shortness of breath; but denies any worsening shortness of breath.  He also denies any chest pain or chest pressure.  On exam.-Patient does have +2 edema to his bilateral lower extremities; with the right leg slightly larger than the left.  All pulses are palpable in all extremities are warm.  Will obtain a Doppler ultrasound of the right lower extremity tomorrow morning 07/02/2015 for further evaluation of a possible DVT. __________________________________________________________  Update: Patient was scheduled for a right lower extremity Doppler ultrasound to rule out DVT for tomorrow 07/02/2015 at 10 AM.  However, since patient is to be admitted today.-Will forego the Doppler ultrasound.  Patient has already received a Lovenox injection 120 mg this evening for newly diagnosed pulmonary embolism.

## 2015-07-01 NOTE — Telephone Encounter (Signed)
Patient came into [facility] and filled out a walk in form- he has increased hacking cough with period of vomiting. Pt states he has not been able to eat or drink much due to cough. He denies fever. Added labs and appt with [facility] for today

## 2015-07-01 NOTE — Assessment & Plan Note (Signed)
Potassium is elevated to 5.2 today.  Confirmed the patient is not taking any potassium supplements.  Most likely, slightly elevated.  Potassium is secondary to dehydration.  Will continue to monitor closely.

## 2015-07-01 NOTE — Assessment & Plan Note (Addendum)
Patient received cycle 4 of his Taxol/Herceptin chemotherapy regimen on 06/24/2015.  He presents to the [facility] today with complaint of increasing coughing spasms to the point of vomiting whenever he drinks or eats almost anything.  He states he is now only able to eat small bites of banana without any issues.  He continues with stable; but, chronic dyspnea.  He feels mildly dehydrated today.  He has also noticed onset of bilateral lower extremity edema within the last week as well.  He denies any calf pain.  He denies any recent fevers or chills.  Patient also continues with his right Pleurx catheter for a chronic pleural effusion.  He has plans to follow-up with Dr. Servando Snare pulmonologist tomorrow 07/02/2015.  Patient has been taking Percocet one half a tablet and/or Tessalon Perles for control of his cough in the past.  Patient will undergo a CT angiogram of the chest, a barium swallow, and a right lower extremity Doppler ultrasound within the next 24 hours for further evaluation.  Patient is scheduled to return on 07/08/2015 for labs, visit, and his next chemotherapy. ____________________________________________________________________________________  Update: CT angiogram of the chest revealed pulmonary embolism, pericardial effusion, and increasing left pleural effusion.  Patient has been given Lovenox 120 mg subcutaneously today for initial treatment of pulmonary embolism. Due to these findings.-Patient agreed to be admitted to the hospital for further evaluation and management.  Brief history.  Report were called to the hospitalist, Dr. Altha Harm Rama prior to transporting the patient to room 1340 via wheelchair.  Per the cancer Center nurse.  Also, this provider called a brief history and report to the floor nurse Fred,RN as well.  Requested that the floor nurse call the flow manage her as soon as patient arrived to the floor to alert them of the patient's arrival.

## 2015-07-01 NOTE — H&P (Addendum)
[facility] History and Physical  Joel Miller WYO:[id number] DOB: 13-Dec-1955 DOA: 07/01/2015  Referring physician: NP [facility]  PCP: [name], MD   Chief Complaint: Dysphagia  HPI: Joel Miller is a 59 y.o. 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 Currently on Taxol and Herceptin. Patient had a Pleurx catheter placed several weeks ago and initially would have approximate 400 mL out in the 24 hour period, over the last several days she's only had 20 mL out over a 24-hour period. Patient presented to the [facility] today for treatment of an ongoing worsening cough. Cough started a partially 7-10 days ago. Intermittent. Getting worse. Initially improved with Tessalon and oxycodone but these are no longer controlling his symptoms. Sometimes patient states he is very violent coughing episodes for about 15 seconds with mild phlegm production. Patient also now complaining of 4 days of worsening dysphagia. Patient only able to take small bites of banana and takes him approximately 1 hour to eat an entire  banana .states that food feels like it gets stuck in his throat.    of note patient also with several day history of worsening lower extremity edema and was evaluated today and noted to have a pulmonary embolus. Denies any chest pain, palpitations, worsening shortness of breath or dyspnea  Review of Systems:  Constitutional:  No night sweats, fever HEENT:  No headaches, Difficulty swallowing,Tooth/dental problems,Sore throat, Cardio-vascular:  No chest pain, Orthopnea, PND, dizziness, palpitations  GI:  No heartburn, indigestion, abdominal pain, nausea, vomiting, diarrhea, change in bowel habits Resp: Per HPI Skin:  no rash or lesions.  GU:  no dysuria, change in color of urine, no urgency or frequency. No flank pain.  Musculoskeletal:   No joint pain  or swelling. No decreased range of motion. No back pain.  Psych:  No change in mood or affect. No depression or anxiety. No memory loss.  Neuro:  No change in sensation, unilateral strength, or cognitive abilities  All other systems were reviewed and are negative.  Past Medical History  Diagnosis Date  . Hyperlipidemia   . Obesity     lost 80 lbs  . Hypogonadism male   . Vitamin D deficiency   . Esophageal cancer ([facility]) 05/18/14    Distal  . Allergy     HEY FEVER  . GERD (gastroesophageal reflux disease)     pt feels it was related to cancer  . S/P radiation therapy 06/11/2014-07/18/2014  . Hypertension     no med in 95months due to weight loss  . Diabetes mellitus without complication ([facility])     type II no med 60 days   . Pleural effusion    Past Surgical History  Procedure Laterality Date  . Colonoscopy    . 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 Complete esophagectomy N/A 09/05/2014    Procedure: TRANSHIATAL TOTAL ESOPHAGECTOMY; Cervical esophagogastrostomy;  Surgeon: [name], MD;  Location: [facility];  Service: Thoracic;  Laterality: N/A;  . Video bronchoscopy N/A 09/05/2014    Procedure: VIDEO BRONCHOSCOPY;  Surgeon: [name], MD;  Location: [facility];  Service: Thoracic;  Laterality: N/A;  . Jejunostomy N/A 09/05/2014    Procedure: FEEDING JEJUNOSTOMY;  Surgeon: [name], MD;  Location: [facility];  Service: Thoracic;  Laterality: N/A;  . Pyloroplasty N/A 09/05/2014    Procedure: PYLOROPLASTY;  Surgeon: [name], MD;  Location: [facility];  Service: Thoracic;  Laterality: N/A;  . Wisdom tooth extraction      2010  . Chest tube insertion Right 05/24/2015    Procedure: INSERTION PLEURAL DRAINAGE CATHETER;  Surgeon: [name], MD;  Location: [facility];  Service: Thoracic;  Laterality: Right;   Social History:  reports that he has quit smoking. His smoking use included Cigars. He has never used smokeless tobacco. He reports that he drinks about 1.2 oz of  alcohol per week. He reports that he does not use illicit drugs.  Allergies  Allergen Reactions  . Ace Inhibitors     cough    Family History  Problem Relation Age of Onset  . Hypertension Mother   . Alzheimer's disease Mother   . Cancer Father 69    throat  . Prostate cancer Brother   . Colon cancer Neg Hx   . Colon polyps Neg Hx      Prior to Admission medications   Medication Sig Start Date End Date Taking? Authorizing Provider  benzonatate (TESSALON) 200 MG capsule Take 200 mg by mouth every 4 (four) hours as needed. 06/28/15  Yes Historical Provider, MD  Naproxen Sodium 220 MG CAPS Take 1 capsule by mouth daily as needed (pain).   Yes Historical Provider, MD  oxazepam (SERAX) 15 MG capsule TAKE 1 TO 2 CAPSULES BY MOUTH 1 HOUR PRIOR TO BEDTIME FOR SLEEP IF NEEDED. *STOP TAKING ZOLPIDEM* 06/03/15  Yes Courtney Forcucci, PA-C  oxyCODONE-acetaminophen (PERCOCET/ROXICET) 5-325 MG tablet Take 1 tablet by mouth every 4 (four) hours as needed for severe pain. Patient taking differently: Take 0.5 tablets by mouth every 4 (four) hours as needed for moderate pain or severe pain.  06/17/15  Yes Owens Shark, NP  prochlorperazine (COMPAZINE) 10 MG tablet Take 1 tablet (10 mg total) by mouth every 6 (six) hours as needed for nausea. 05/27/15  Yes [name], MD  aspirin 81 MG tablet Take 81 mg by mouth daily.    Historical Provider, MD  Cholecalciferol (VITAMIN D PO) Take 10,000 Units by mouth daily.    Historical Provider, MD  mirtazapine (REMERON SOL-TAB) 15 MG disintegrating tablet Take 1 tablet (15 mg total) by mouth at bedtime. Patient not taking: Reported on 07/01/2015 05/27/15   [name], MD  Multiple Vitamin (MULTIVITAMIN) capsule Take 1 capsule by mouth daily.    Historical Provider, MD  predniSONE (DELTASONE) 10 MG tablet Take 1 tablet (10 mg total) by mouth daily with breakfast. 05/27/15   [name], MD   Physical Exam: Filed Vitals:   07/01/15 1801  BP: 128/93    Pulse: 117  Temp: 98 F (36.7 C)  TempSrc: Oral  Resp: 20  Height: 5\' 10"  (1.778 m)  Weight: 78.472 kg (173 lb)  SpO2: 98%    Wt Readings from Last 3 Encounters:  07/01/15 78.472 kg (173 lb)  07/01/15 79.924 kg (176 lb 3.2 oz)  06/17/15 76.93 kg (169 lb 9.6 oz)    General:  Appears calm and comfortable Eyes:  PERRL, EOMI, normal lids, iris ENT:  grossly normal hearing, lips & tongue Neck:  no LAD, masses or thyromegaly Cardiovascular:  RRR, no m/r/g.  1-2+ bilateral lower extremity edema.  Respiratory: diminished breath sounds bilaterally in the bases on posterior auscultation. Normal effort. Pleurx catheter in place in right posterior lung field.  Abdomen:  soft, ntnd Skin:  no rash or induration seen on limited exam Musculoskeletal:  grossly normal tone BUE/BLE Psychiatric:  grossly normal mood and affect, speech fluent and appropriate Neurologic:  CN 2-12 grossly intact, moves all extremities in coordinated fashion.          Labs on Admission:  Basic Metabolic Panel:  Recent Labs Lab 07/01/15 1028  NA 130*  K 5.2*  CO2 24  GLUCOSE 164*  BUN 18.4  CREATININE 1.0  CALCIUM 9.8   Liver Function Tests:  Recent Labs Lab 07/01/15 1028  AST 29  ALT 22  ALKPHOS 130  BILITOT 0.51  PROT 6.4  ALBUMIN 2.4*   No results for input(s): LIPASE, AMYLASE in the last 168 hours. No results for input(s): AMMONIA in the last 168 hours. CBC:  Recent Labs Lab 07/01/15 1028  WBC 7.3  NEUTROABS 6.5  HGB 10.8*  HCT 32.2*  MCV 89.3  PLT 503*   Cardiac Enzymes: No results for input(s): CKTOTAL, CKMB, CKMBINDEX, TROPONINI in the last 168 hours.  BNP (last 3 results) No results for input(s): BNP in the last 8760 hours.  ProBNP (last 3 results)  Recent Labs  05/10/15 1718  PROBNP 29.0    CBG: No results for input(s): GLUCAP in the last 168 hours.  Radiological Exams on Admission: Ct Angio Chest Pe W/cm &/or Wo Cm  07/01/2015  ADDENDUM REPORT: 07/01/2015  15:55 ADDENDUM: The left pleural effusion is larger than the right pleural effusion. The statement in the text is error; the statement in the impression is correct. Electronically Signed   By: Lowella Grip III M.D.   On: 07/01/2015 15:55  07/01/2015  CLINICAL DATA:  Shortness of Breath.  Esophageal carcinoma EXAM: CT ANGIOGRAPHY CHEST WITH CONTRAST TECHNIQUE: Multidetector CT imaging of the chest was performed using the standard protocol during bolus administration of intravenous contrast. Multiplanar CT image reconstructions and MIPs were obtained to evaluate the vascular anatomy. CONTRAST:  133mL OMNIPAQUE IOHEXOL 350 MG/ML SOLN COMPARISON:  Chest CT May 14, 2015; chest radiograph June 13, 2015 FINDINGS: There are focal pulmonary emboli in the proximal lingular pulmonary artery branches causing partial obstruction. No other focal pulmonary emboli are appreciable. No more central pulmonary embolus seen. No evidence of right heart strain. There is no thoracic aortic aneurysm or dissection. Visualized great vessels appear unremarkable. There are bilateral pleural effusions, larger on the right than on the left. There is a chest tube at the right base with loculation of fluid on the right. There is patchy atelectasis in both lower lobes, more on the right than on the left. There is distention of the upper esophagus with an abrupt narrowing of the esophagus at the level of the carina. There is soft tissue fullness in this area, likely due to mass in the region of the esophagus at and below the carina. Thyroid appears unremarkable. There are multiple mildly prominent mediastinal lymph nodes, largest measuring 2.0 x 1.7 cm in the aortopulmonary window region. There is a sizable pericardial effusion. In the visualized upper abdomen, there are stable adrenal masses bilaterally. The larger masses on the left measuring 2.4 x 1.3 cm. There is hepatic steatosis. There is degenerative change in the thoracic spine.  No well-defined sclerotic or lytic bone lesions are identified. Review of the MIP images confirms the above findings. IMPRESSION: There is a focal pulmonary emboli in the proximal lingular pulmonary artery branches. No larger pulmonary emboli identified. No right heart strain. Bilateral pleural effusions, larger on the left than on the right. Areas of patchy atelectasis in both lung bases. Pleural drainage catheter present on the right. Sizable  pericardial effusion present. Several small aortopulmonary window lymph nodes present. Mass in the esophagus at the level of the carina. Hepatic steatosis. Stable adrenal masses bilaterally. Critical Value/emergent results were called by telephone at the time of interpretation on 07/01/2015 at 2:42 pm to [facility], NP , who verbally acknowledged these results. Electronically Signed: By: Lowella Grip III M.D. On: 07/01/2015 14:42   Dg Esophagus  07/01/2015  CLINICAL DATA:  History of esophageal cancer status post total esophagectomy. Additional surgical history of jejunostomy and pyloroplasty. EXAM: ESOPHOGRAM/BARIUM SWALLOW TECHNIQUE: Single contrast examination was performed using  thin barium. FLUOROSCOPY TIME:  If the device does not provide the exposure index: Fluoroscopy Time:  1 minutes 43 seconds Number of Acquired Images:  28 COMPARISON:  Chest CT from earlier same day. FINDINGS: Initial swallows show evidence of patient's esophagectomy and gastric pull-up. Contrast moved promptly through the gastric pull-up to the level of the diaphragm. No evidence of intrinsic or extrinsic wall irregularities within the upper or mid portions of the gastric pull-up. A persistent narrowing was then identified in the region of the gastric pylorus. When contrast reached this area, patient did have coughing fits and slight regurgitation. Eventually, contrast did slowly pass from the slightly distended gastric pull-up to the proximal small bowel. No extraluminal contrast  material seen. IMPRESSION: Persistent high grade narrowing in the region of the gastric pylorus. The smooth narrowing is more likely a benign stricture caused by thickening related to the previous pyloroplasty than due to a neoplastic cause. Appearance also is not suggestive of extrinsic narrowing. Electronically Signed   By: Franki Cabot M.D.   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   Dysphagia: Likely secondary to ongoing cancer. Barium swallow showing persistent high-grade narrowing at the gastric pylorus. Patient now only able to eat small bites of bananas. Per oncology team who saw pt prior to admission, patient is to be seen by Dr.[name] and Pyrtle - f/u recs of CTS and GI - Hep for anticoagulation of DVT/PE - clear liquid diet then NPO after midnight  DVT/PE: New dx. Likely secondary to malignancy and increasing sedentary lifestyle. Given Lovenox on day of admission - Change from lovenox to Heparin in anticipation of EGD - change to Xarelto, Eliquis, other after procedure  Pleural/Pericardial effusions: R Pleurex catheter in place. Currently draining 20cc/d at home. Placed by Dr. Servando Snare. New L effusion noted and pericardial effusion. No respiratory decompensation at this time - f/u CTS recommendations in am - Echo - continue Tessalon, Oxy for cough and add mucinex - Pt refusing Tele  Metastatic Esophageal cancer: Followed by H/O, Dr. Benay Spice.   currently undergoing treatment with Taxol and Herceptin. Based on recent scans malignancy appears to be progressing. - F/u H/O recs - +/- palliative/Hospice  Hyponatremia: Na 130 at H/O on day of admission. Received 1L NS - repeat BMET in am  Pre-DM: - SSI - A1c  Insomnia: - continue serax    Code Status: FULL  DVT Prophylaxis: Hep  Family Communication: wife Disposition Plan:  Pending Improvement    Rosina Cressler Lenna Sciara, MD Family Medicine [facility] [email or web address] Password [email or web address]

## 2015-07-01 NOTE — Progress Notes (Signed)
ANTICOAGULATION CONSULT NOTE - Initial Consult  Pharmacy Consult for Heparin Indication: pulmonary embolus  Allergies  Allergen Reactions  . Ace Inhibitors     cough    Patient Measurements: Height: 5\' 10"  (177.8 cm) Weight: 173 lb (78.472 kg) IBW/kg (Calculated) : 73 Heparin Dosing Weight: 78 kg  Vital Signs: Temp: 98 F (36.7 C) (10/24 1801) Temp Source: Oral (10/24 1801) BP: 128/93 mmHg (10/24 1801) Pulse Rate: 117 (10/24 1801)  Labs:  Recent Labs  07/01/15 1028 07/01/15 1028  HGB 10.8*  --   HCT 32.2*  --   PLT 503*  --   CREATININE  --  1.0    Estimated Creatinine Clearance: 81.1 mL/min (by C-G formula based on Cr of 1).   Medical History: Past Medical History  Diagnosis Date  . Hyperlipidemia   . Obesity     lost 80 lbs  . Hypogonadism male   . Vitamin D deficiency   . Esophageal cancer ([facility]) 05/18/14    Distal  . Allergy     HEY FEVER  . GERD (gastroesophageal reflux disease)     pt feels it was related to cancer  . S/P radiation therapy 06/11/2014-07/18/2014  . Hypertension     no med in 26months due to weight loss  . 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 Pleural effusion     Medications:  Scheduled:  . [START ON 07/02/2015] insulin aspart  0-9 Units Subcutaneous TID WC  . [START ON 07/02/2015] predniSONE  10 mg Oral Q breakfast  . sodium chloride  3 mL Intravenous Q12H  . sodium chloride  3 mL Intravenous Q12H   Infusions:    Assessment:  59 yr male undergoing treatment for esophageal cancer with mets to the lungs and adrenal gland. Went to [facility] today with c/o worsening cough and worsening lower extremity edema.  CTAngio shows pulmonary embolism  Plan doppler on 10/25 to r/o DVT  Patient received a single dose of Lovenox 120mg  (1.5mg /kg) at [facility] prior to transfer to [facility] consulted to dose IV heparin in anticipation of EGD  Goal of Therapy:  Heparin level 0.3-0.7 units/ml Monitor  platelets by anticoagulation protocol: Yes   Plan:   As patient received Lovenox 1.5 mg/kg, patient is adequately covered with anticoagulation for 24 hr period.  Patient received Lovenox dose @ 17:26 on 10/24.  IV heparin will not be needed to begin until 22-23hr after Lovenox dose (~ 15:30-16:30 on 10/25).  Dr Marily Memos and RN aware that IV heparin will not begin until 10/25 afternoon.  Will hold off on placing heparin orders until 10/25.  Will follow plan for any procedure.   Khyren Hing, Toribio Harbour, PharmD 07/01/2015,9:27 PM

## 2015-07-01 NOTE — Assessment & Plan Note (Signed)
Albumen continues low at 2.4.  Patient was encouraged to push protein in his diet is much as possible.

## 2015-07-01 NOTE — Progress Notes (Signed)
SYMPTOM MANAGEMENT CLINIC   HPI: Joel Miller 59 y.o. 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  Currently undergoing Taxol/Herceptin chemotherapy regimen.   Patient received cycle 4 of his Taxol/Herceptin chemotherapy regimen on 06/24/2015.  He presents to the [facility] today with complaint of increasing coughing spasms to the point of vomiting whenever he drinks or eats almost anything.  He states he is now only able to eat small bites of banana without any issues.  He continues with stable; but, chronic dyspnea.  He feels mildly dehydrated today.  He has also noticed onset of bilateral lower extremity edema within the last week as well.  He denies any calf pain.  He denies any recent fevers or chills.  Patient also continues with his right Pleurx catheter for a chronic pleural effusion.  He has plans to follow-up with Dr. Servando Snare pulmonologist tomorrow 07/02/2015.  Patient has been taking Percocet one half a tablet and/or Tessalon Perles for control of his cough in the past.  HPI  ROS  Past Medical History  Diagnosis Date  . Hyperlipidemia   . Obesity     lost 80 lbs  . Hypogonadism male   . Vitamin D deficiency   . Esophageal cancer ([place]) 05/18/14    Distal  . Allergy     HEY FEVER  . GERD (gastroesophageal reflux disease)     pt feels it was related to cancer  . S/P radiation therapy 06/11/2014-07/18/2014  . Hypertension     no med in 76month due to weight loss  . Diabetes [place]     type II no med 60 days   . Pleural effusion     Past Surgical History  Procedure Laterality Date  . Colonoscopy    . 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 Complete esophagectomy N/A 09/05/2014    Procedure: TRANSHIATAL TOTAL ESOPHAGECTOMY; Cervical esophagogastrostomy;  Surgeon: EGrace Isaac MD;  [facility]  Service: Thoracic;  Laterality: N/A;  . Video bronchoscopy N/A 09/05/2014   Procedure: VIDEO BRONCHOSCOPY;  Surgeon: EGrace Isaac MD;  [place] [facility]  Service: Thoracic;  Laterality: N/A;  . Jejunostomy N/A 09/05/2014    Procedure: FEEDING JEJUNOSTOMY;  Surgeon: EGrace Isaac MD;  [place] [facility]  Service: Thoracic;  Laterality: N/A;  . Pyloroplasty N/A 09/05/2014    Procedure: PYLOROPLASTY;  Surgeon: EGrace Isaac MD;  [place] [facility]  Service: Thoracic;  Laterality: N/A;  . Wisdom tooth extraction      2010  . Chest tube insertion Right 05/24/2015    Procedure: INSERTION PLEURAL DRAINAGE CATHETER;  Surgeon: EGrace Isaac MD;  [place] [facility]  Service: Thoracic;  Laterality: Right;    has Hypertension; Hyperlipidemia; Testosterone Deficiency; Vitamin D deficiency; Malignant ne[place] of [place] ([place]; Severe obesity (BMI >= 40) ([facility]; Medication management; GERD; Personal history of colonic polyps; Carcinoma of distal third of esophagus ([place]; Esophagus cancer ([place]; Prediabetes; Dyspnea; Cough; Pleural effusion on right; Hyperkalemia; Hypoalbuminemia due to protein-calorie malnutrition ([place]; Dehydration; and Peripheral edema on his problem list.    is allergic to ace inhibitors.    Medication List       This list is accurate as of: 07/01/15  6:03 PM.  Always use your most recent med list.               aspirin 81 MG tablet  Take 81 mg by mouth daily.     mirtazapine  15 MG disintegrating tablet  Commonly known as:  REMERON SOL-TAB  Take 1 tablet (15 mg total) by mouth at bedtime.     multivitamin capsule  Take 1 capsule by mouth daily.     Naproxen Sodium 220 MG Caps  Take 1 capsule by mouth daily as needed (pain).     oxazepam 15 MG capsule  Commonly known as:  SERAX  TAKE 1 TO 2 CAPSULES BY MOUTH 1 HOUR PRIOR TO BEDTIME FOR SLEEP IF NEEDED. *STOP TAKING ZOLPIDEM*     oxyCODONE-acetaminophen 5-325 MG tablet  Commonly known as:  PERCOCET/ROXICET  Take 1 tablet by mouth every 4 (four) hours as needed for severe pain.       predniSONE 10 MG tablet  Commonly known as:  DELTASONE  Take 1 tablet (10 mg total) by mouth daily with breakfast.     prochlorperazine 10 MG tablet  Commonly known as:  COMPAZINE  Take 1 tablet (10 mg total) by mouth every 6 (six) hours as needed for nausea.     VITAMIN D PO  Take 10,000 Units by mouth daily.         PHYSICAL EXAMINATION  Oncology Vitals 07/01/2015 07/01/2015 07/01/2015 06/24/2015 06/17/2015 06/13/2015 06/10/2015  Height - - - - 178 cm 178 cm 178 cm  Weight - - 79.924 kg - 76.93 kg 77.111 kg 77.111 kg  Weight (lbs) - - 176 lbs 3 oz - 169 lbs 10 oz 170 lbs 170 lbs  BMI (kg/m2) - - - - 24.34 kg/m2 24.39 kg/m2 24.39 kg/m2  Temp - 98.2 - 98.9 97.8 - 97.9  Pulse 130 126 130 109 96 111 101  Resp - _0 SpO2 - 98 97 99 100 97 98  BSA (m2) - - - - 1.95 m2 1.95 m2 1.95 m2   BP Readings from Last 3 Encounters:  07/01/15 127/96  06/24/15 129/84  06/17/15 147/87    Physical Exam  Constitutional: He is oriented to person, place, and time.  Patient appears fatigued, slightly weak, frail/thin, and chronically ill.  HENT:  Head: Normocephalic and atraumatic.  Mouth/Throat: Oropharynx is clear and moist.  Eyes: Conjunctivae and EOM are normal. Pupils are equal, round, and reactive to light. Right eye exhibits no discharge. Left eye exhibits no discharge. No scleral icterus.  Neck: Normal range of motion. Neck supple. No JVD present. No tracheal deviation present. No thyromegaly present.  Cardiovascular: Normal heart sounds and intact distal pulses.   Tachycardic  Pulmonary/Chest: No stridor. No respiratory distress. He has no wheezes. He has rales. He exhibits no tenderness.  Bilateral breath sounds slightly diminished; with the left upper lobe slightly more decreased.  Patient appears slightly short of breath with general conversation as baseline.  Right Pleurx catheter intact with no evidence of infection.  Abdominal: Soft. Bowel sounds are normal. He  exhibits no distension and no mass. There is no tenderness. There is no rebound and no guarding.  Musculoskeletal: Normal range of motion. He exhibits edema. He exhibits no tenderness.  +2 edema to bilateral lower extremities; with the right slightly larger than the left.  No calf tenderness on exam.  Lymphadenopathy:    He has no cervical adenopathy.  Neurological: He is alert and oriented to person, place, and time.  Skin: Skin is warm and dry. No rash noted. No erythema. There is pallor.  Psychiatric: Affect normal.  Nursing note and vitals reviewed.   LABORATORY DATA:. Appointment on 07/01/2015  Component Date  Value Ref Range Status  . WBC 07/01/2015 7.3  4.0 - 10.3 10e3/uL Final  . NEUT# 07/01/2015 6.5  1.5 - 6.5 10e3/uL Final  . HGB 07/01/2015 10.8* 13.0 - 17.1 g/dL Final  . HCT 07/01/2015 32.2* 38.4 - 49.9 % Final  . Platelets 07/01/2015 503* 140 - 400 10e3/uL Final  . MCV 07/01/2015 89.3  79.3 - 98.0 fL Final  . MCH 07/01/2015 29.9  27.2 - 33.4 pg Final  . MCHC 07/01/2015 33.5  32.0 - 36.0 g/dL Final  . RBC 07/01/2015 3.61* 4.20 - 5.82 10e6/uL Final  . RDW 07/01/2015 15.7* 11.0 - 14.6 % Final  . lymph# 07/01/2015 0.2* 0.9 - 3.3 10e3/uL Final  . MONO# 07/01/2015 0.5  0.1 - 0.9 10e3/uL Final  . Eosinophils Absolute 07/01/2015 0.0  0.0 - 0.5 10e3/uL Final  . Basophils Absolute 07/01/2015 0.0  0.0 - 0.1 10e3/uL Final  . NEUT% 07/01/2015 89.4* 39.0 - 75.0 % Final  . LYMPH% 07/01/2015 2.9* 14.0 - 49.0 % Final  . MONO% 07/01/2015 7.5  0.0 - 14.0 % Final  . EOS% 07/01/2015 0.1  0.0 - 7.0 % Final  . BASO% 07/01/2015 0.1  0.0 - 2.0 % Final  . Sodium 07/01/2015 130* 136 - 145 mEq/L Final  . Potassium 07/01/2015 5.2* 3.5 - 5.1 mEq/L Final  . Chloride 07/01/2015 97* 98 - 109 mEq/L Final  . CO2 07/01/2015 24  22 - 29 mEq/L Final  . Glucose 07/01/2015 164* 70 - 140 mg/dl Final   Glucose reference range is for nonfasting patients. Fasting glucose reference range is 70- 100.  Marland Kitchen BUN  07/01/2015 18.4  7.0 - 26.0 mg/dL Final  . Creatinine 07/01/2015 1.0  0.7 - 1.3 mg/dL Final  . Total Bilirubin 07/01/2015 0.51  0.20 - 1.20 mg/dL Final  . Alkaline Phosphatase 07/01/2015 130  40 - 150 U/L Final  . AST 07/01/2015 29  5 - 34 U/L Final  . ALT 07/01/2015 22  0 - 55 U/L Final  . Total Protein 07/01/2015 6.4  6.4 - 8.3 g/dL Final  . Albumin 07/01/2015 2.4* 3.5 - 5.0 g/dL Final  . Calcium 07/01/2015 9.8  8.4 - 10.4 mg/dL Final  . Anion Gap 07/01/2015 10  3 - 11 mEq/L Final  . EGFR 07/01/2015 86* >90 ml/min/1.73 m2 Final   eGFR is calculated using the CKD-EPI Creatinine Equation (2009)     RADIOGRAPHIC STUDIES: Ct Angio Chest Pe W/cm &/or Wo Cm  07/01/2015  ADDENDUM REPORT: 07/01/2015 15:55 ADDENDUM: The left pleural effusion is larger than the right pleural effusion. The statement in the text is error; the statement in the impression is correct. Electronically Signed   By: Lowella Grip III M.D.   On: 07/01/2015 15:55  07/01/2015  CLINICAL DATA:  Shortness of Breath.  Esophageal carcinoma EXAM: CT ANGIOGRAPHY CHEST WITH CONTRAST TECHNIQUE: Multidetector CT imaging of the chest was performed using the standard protocol during bolus administration of intravenous contrast. Multiplanar CT image reconstructions and MIPs were obtained to evaluate the vascular anatomy. CONTRAST:  139m OMNIPAQUE IOHEXOL 350 MG/ML SOLN COMPARISON:  Chest CT May 14, 2015; chest radiograph June 13, 2015 FINDINGS: There are focal pulmonary emboli in the proximal lingular pulmonary artery branches causing partial obstruction. No other focal pulmonary emboli are appreciable. No more central pulmonary embolus seen. No evidence of right heart strain. There is no thoracic aortic aneurysm or dissection. Visualized great vessels appear unremarkable. There are bilateral pleural effusions, larger on the right than on the  left. There is a chest tube at the right base with loculation of fluid on the right. There is  patchy atelectasis in both lower lobes, more on the right than on the left. There is distention of the upper esophagus with an abrupt narrowing of the esophagus at the level of the carina. There is soft tissue fullness in this area, likely due to mass in the region of the esophagus at and below the carina. Thyroid appears unremarkable. There are multiple mildly prominent mediastinal lymph nodes, largest measuring 2.0 x 1.7 cm in the aortopulmonary window region. There is a sizable pericardial effusion. In the visualized upper abdomen, there are stable adrenal masses bilaterally. The larger masses on the left measuring 2.4 x 1.3 cm. There is hepatic steatosis. There is degenerative change in the thoracic spine. No well-defined sclerotic or lytic bone lesions are identified. Review of the MIP images confirms the above findings. IMPRESSION: There is a focal pulmonary emboli in the proximal lingular pulmonary artery branches. No larger pulmonary emboli identified. No right heart strain. Bilateral pleural effusions, larger on the left than on the right. Areas of patchy atelectasis in both lung bases. Pleural drainage catheter present on the right. Sizable pericardial effusion present. Several small aortopulmonary window lymph nodes present. Mass in the esophagus at the level of the carina. Hepatic steatosis. Stable adrenal masses bilaterally. Critical Value/emergent results were called by telephone at the time of interpretation on 07/01/2015 at 2:42 pm to [facility], NP , who verbally acknowledged these results. Electronically Signed: By: Lowella Grip III M.D. On: 07/01/2015 14:42   Dg Esophagus  07/01/2015  CLINICAL DATA:  History of esophageal cancer status post total esophagectomy. Additional surgical history of jejunostomy and pyloroplasty. EXAM: ESOPHOGRAM/BARIUM SWALLOW TECHNIQUE: Single contrast examination was performed using  thin barium. FLUOROSCOPY TIME:  If the device does not provide the exposure  index: Fluoroscopy Time:  1 minutes 43 seconds Number of Acquired Images:  28 COMPARISON:  Chest CT from earlier same day. FINDINGS: Initial swallows show evidence of patient's esophagectomy and gastric pull-up. Contrast moved promptly through the gastric pull-up to the level of the diaphragm. No evidence of intrinsic or extrinsic wall irregularities within the upper or mid portions of the gastric pull-up. A persistent narrowing was then identified in the region of the gastric pylorus. When contrast reached this area, patient did have coughing fits and slight regurgitation. Eventually, contrast did slowly pass from the slightly distended gastric pull-up to the proximal small bowel. No extraluminal contrast material seen. IMPRESSION: Persistent high grade narrowing in the region of the gastric pylorus. The smooth narrowing is more likely a benign stricture caused by thickening related to the previous pyloroplasty than due to a neoplastic cause. Appearance also is not suggestive of extrinsic narrowing. Electronically Signed   By: Franki Cabot M.D.   On: 07/01/2015 16:14    ASSESSMENT/PLAN:    Carcinoma of distal third of esophagus [facility]) Patient received cycle 4 of his Taxol/Herceptin chemotherapy regimen on 06/24/2015.  He presents to the [facility] today with complaint of increasing coughing spasms to the point of vomiting whenever he drinks or eats almost anything.  He states he is now only able to eat small bites of banana without any issues.  He continues with stable; but, chronic dyspnea.  He feels mildly dehydrated today.  He has also noticed onset of bilateral lower extremity edema within the last week as well.  He denies any calf pain.  He denies any recent fevers  or chills.  Patient also continues with his right Pleurx catheter for a chronic pleural effusion.  He has plans to follow-up with Dr. Servando Snare pulmonologist tomorrow 07/02/2015.  Patient has been taking Percocet one half a tablet  and/or Tessalon Perles for control of his cough in the past.  Patient will undergo a CT angiogram of the chest, a barium swallow, and a right lower extremity Doppler ultrasound within the next 24 hours for further evaluation.  Patient is scheduled to return on 07/08/2015 for labs, visit, and his next chemotherapy. ____________________________________________________________________________________  Update: CT angiogram of the chest revealed pulmonary embolism, pericardial effusion, and increasing left pleural effusion.  Patient has been given Lovenox 120 mg subcutaneously today for initial treatment of pulmonary embolism. Due to these findings.-Patient agreed to be admitted to the hospital for further evaluation and management.  Brief history.  Report were called to the hospitalist, Dr. Altha Harm Rama prior to transporting the patient to room 1340 via wheelchair.  Per the cancer Center nurse.  Also, this provider called a brief history and report to the floor nurse Fred,RN as well.  Requested that the floor nurse call the flow manage her as soon as patient arrived to the floor to alert them of the patient's arrival.  Pleural effusion on right Patient continues with a chronic right-sided Pleurx catheter.  Patient states that he has an appointment with his pulmonologist Dr. Servando Snare for tomorrow 07/02/2015 for follow-up of his Pleurx catheter. ____________________________________________________________________________   Update: CT angiogram of the chest revealed an improving right pleural effusion; and a increasing left pleural effusion.  Due to multiple corticated diagnoses this evening.-Patient will be admitted to the hospital for further evaluation and management today.  Brief history and  Report will call to the hospitalist prior to the patient being transported to the hospital via the [place] nurse.   Hyperkalemia Potassium is elevated to 5.2 today.  Confirmed the patient is not taking  any potassium supplements.  Most likely, slightly elevated.  Potassium is secondary to dehydration.  Will continue to monitor closely.  Hypoalbuminemia due to protein-calorie malnutrition ([place]) Albumen continues low at 2.4.  Patient was encouraged to push protein in his diet is much as possible.  Dehydration He presents to the [facility] today with complaint of increasing coughing spasms to the point of vomiting whenever he drinks or eats almost anything.  He states he is now only able to eat small bites of banana without any issues.  Patient feels dehydrated today.  Patient received approx 750 mls normal saline IV fluid rehydration today at the cancer center; was also encouraged to push fluids is much as possible at home.  Peripheral edema Patient notes a one-week history of bilateral lower extremity edema.  He continues with complain of chronic shortness of breath; but denies any worsening shortness of breath.  He also denies any chest pain or chest pressure.  On exam.-Patient does have +2 edema to his bilateral lower extremities; with the right leg slightly larger than the left.  All pulses are palpable in all extremities are warm.  Will obtain a Doppler ultrasound of the right lower extremity tomorrow morning 07/02/2015 for further evaluation of a possible DVT. __________________________________________________________  Update: Patient was scheduled for a right lower extremity Doppler ultrasound to rule out DVT for tomorrow 07/02/2015 at 10 AM.  However, since patient is to be admitted today.-Will forego the Doppler ultrasound.  Patient has already received a Lovenox injection 120 mg this evening for newly diagnosed pulmonary  embolism.   Patient stated understanding of all instructions; and was in agreement with this plan of care. The patient knows to call the clinic with any problems, questions or concerns.   This was a shared visit with Dr. Benay Spice today.  Total time spent with  patient was 40 minutes;  with greater than 85 percent of that time spent in face to face counseling regarding patient's symptoms,  and coordination of care and follow up.  Disclaimer:This dictation was prepared with Dragon/digital dictation along with [employer]. Any transcriptional errors that result from this process are unintentional.  Drue Second, NP 07/01/2015  This was a shared visit with Drue Second. Mr. Wandel was interviewed and examined. The CT and barium swallow images were reviewed. I discussed the case with Dr. Servando Snare and Dr. Hilarie Fredrickson.  Mr. Grandstaff has metastatic esophagus cancer. He presented today with severe dysphagia. This appears to be related to a stricture involving the remaining stomach. This may be a malignant stricture. We will ask gastroenterology to perform an upper endoscopy for diagnostic and therapeutic purposes.  The CT confirms a pulmonary embolism. He was started on Lovenox.  He has a progressive left pleural effusion and a pericardial effusion. These are likely malignant and in addition to the esophagus stricture indicate disease progression. Dr. Servando Snare will consider palliative left Pleurx and pericardial drainage procedures.  We will discuss continuing salvage systemic therapy versus Hospice care depending on findings from the endoscopy and drainage procedures.  Recommendations: 1. Admit for supportive care and gastroenterology/thoracic surgery evaluation 2. Echocardiogram 3. Lovenox anticoagulation  Julieanne Manson, M.D.

## 2015-07-01 NOTE — Assessment & Plan Note (Addendum)
Patient continues with a chronic right-sided Pleurx catheter.  Patient states that he has an appointment with his pulmonologist Dr. Servando Snare for tomorrow 07/02/2015 for follow-up of his Pleurx catheter. ____________________________________________________________________________   Update: CT angiogram of the chest revealed an improving right pleural effusion; and a increasing left pleural effusion.  Due to multiple corticated diagnoses this evening.-Patient will be admitted to the hospital for further evaluation and management today.  Brief history and  Report will call to the hospitalist prior to the patient being transported to the hospital via the [place] nurse.

## 2015-07-02 ENCOUNTER — Encounter: Payer: BLUE CROSS/BLUE SHIELD | Admitting: Cardiothoracic Surgery

## 2015-07-02 ENCOUNTER — Inpatient Hospital Stay (HOSPITAL_COMMUNITY): Payer: BLUE CROSS/BLUE SHIELD

## 2015-07-02 ENCOUNTER — Inpatient Hospital Stay (HOSPITAL_COMMUNITY): Admission: RE | Admit: 2015-07-02 | Payer: BLUE CROSS/BLUE SHIELD | Source: Ambulatory Visit

## 2015-07-02 DIAGNOSIS — E119 Type 2 diabetes mellitus without complications: Secondary | ICD-10-CM

## 2015-07-02 DIAGNOSIS — I319 Disease of pericardium, unspecified: Secondary | ICD-10-CM

## 2015-07-02 DIAGNOSIS — E43 Unspecified severe protein-calorie malnutrition: Secondary | ICD-10-CM

## 2015-07-02 DIAGNOSIS — I2699 Other pulmonary embolism without acute cor pulmonale: Principal | ICD-10-CM

## 2015-07-02 DIAGNOSIS — J90 Pleural effusion, not elsewhere classified: Secondary | ICD-10-CM | POA: Diagnosis present

## 2015-07-02 DIAGNOSIS — C7972 Secondary malignant neoplasm of left adrenal gland: Secondary | ICD-10-CM

## 2015-07-02 DIAGNOSIS — C160 Malignant neoplasm of cardia: Secondary | ICD-10-CM

## 2015-07-02 DIAGNOSIS — D638 Anemia in other chronic diseases classified elsewhere: Secondary | ICD-10-CM

## 2015-07-02 DIAGNOSIS — R1310 Dysphagia, unspecified: Secondary | ICD-10-CM

## 2015-07-02 DIAGNOSIS — E875 Hyperkalemia: Secondary | ICD-10-CM

## 2015-07-02 DIAGNOSIS — I313 Pericardial effusion (noninflammatory): Secondary | ICD-10-CM

## 2015-07-02 DIAGNOSIS — Z8501 Personal history of malignant neoplasm of esophagus: Secondary | ICD-10-CM | POA: Diagnosis present

## 2015-07-02 DIAGNOSIS — R933 Abnormal findings on diagnostic imaging of other parts of digestive tract: Secondary | ICD-10-CM | POA: Diagnosis present

## 2015-07-02 LAB — CBC
HCT: 30.9 % — ABNORMAL LOW (ref 39.0–52.0)
HCT: 30.4 % — ABNORMAL LOW (ref 39.0–52.0)
HCT: 31.3 % — ABNORMAL LOW (ref 39.0–52.0)
HEMOGLOBIN: 10.1 g/dL — AB (ref 13.0–17.0)
Hemoglobin: 10.2 g/dL — ABNORMAL LOW (ref 13.0–17.0)
Hemoglobin: 10.2 g/dL — ABNORMAL LOW (ref 13.0–17.0)
MCH: 29.7 pg (ref 26.0–34.0)
MCH: 29.5 pg (ref 26.0–34.0)
MCH: 29.1 pg (ref 26.0–34.0)
MCHC: 33 g/dL (ref 30.0–36.0)
MCHC: 33.2 g/dL (ref 30.0–36.0)
MCHC: 32.6 g/dL (ref 30.0–36.0)
MCV: 90.1 fL (ref 78.0–100.0)
MCV: 88.9 fL (ref 78.0–100.0)
MCV: 89.2 fL (ref 78.0–100.0)
PLATELETS: 424 10*3/uL — AB (ref 150–400)
PLATELETS: 476 10*3/uL — AB (ref 150–400)
Platelets: 423 10*3/uL — ABNORMAL HIGH (ref 150–400)
RBC: 3.43 MIL/uL — AB (ref 4.22–5.81)
RBC: 3.42 MIL/uL — AB (ref 4.22–5.81)
RBC: 3.51 MIL/uL — ABNORMAL LOW (ref 4.22–5.81)
RDW: 16.1 % — ABNORMAL HIGH (ref 11.5–15.5)
RDW: 16.3 % — ABNORMAL HIGH (ref 11.5–15.5)
RDW: 16.2 % — ABNORMAL HIGH (ref 11.5–15.5)
WBC: 7.1 10*3/uL (ref 4.0–10.5)
WBC: 8.1 10*3/uL (ref 4.0–10.5)
WBC: 7.9 10*3/uL (ref 4.0–10.5)

## 2015-07-02 LAB — GLUCOSE, CAPILLARY
GLUCOSE-CAPILLARY: 130 mg/dL — AB (ref 65–99)
GLUCOSE-CAPILLARY: 122 mg/dL — AB (ref 65–99)
Glucose-Capillary: 134 mg/dL — ABNORMAL HIGH (ref 65–99)
Glucose-Capillary: 148 mg/dL — ABNORMAL HIGH (ref 65–99)

## 2015-07-02 LAB — COMPREHENSIVE METABOLIC PANEL
ALT: 152 U/L — ABNORMAL HIGH (ref 17–63)
AST: 167 U/L — ABNORMAL HIGH (ref 15–41)
Albumin: 2.2 g/dL — ABNORMAL LOW (ref 3.5–5.0)
Alkaline Phosphatase: 107 U/L (ref 38–126)
Anion gap: 11 (ref 5–15)
BUN: 25 mg/dL — ABNORMAL HIGH (ref 6–20)
CO2: 22 mmol/L (ref 22–32)
Calcium: 8.9 mg/dL (ref 8.9–10.3)
Chloride: 95 mmol/L — ABNORMAL LOW (ref 101–111)
Creatinine, Ser: 1.24 mg/dL (ref 0.61–1.24)
GFR calc Af Amer: >60 mL/min (ref >60)
GFR calc non Af Amer: >60 mL/min (ref >60)
Glucose, Bld: 130 mg/dL — ABNORMAL HIGH (ref 65–99)
Potassium: 5.6 mmol/L — ABNORMAL HIGH (ref 3.5–5.1)
Sodium: 128 mmol/L — ABNORMAL LOW (ref 135–145)
Total Bilirubin: 0.8 mg/dL (ref 0.3–1.2)
Total Protein: 5.8 g/dL — ABNORMAL LOW (ref 6.5–8.1)

## 2015-07-02 LAB — MRSA PCR SCREENING: MRSA BY PCR: NEGATIVE

## 2015-07-02 LAB — PROTIME-INR
INR: 1.57 — AB (ref 0.00–1.49)
INR: 1.62 — ABNORMAL HIGH (ref 0.00–1.49)
Prothrombin Time: 18.8 seconds — ABNORMAL HIGH (ref 11.6–15.2)
Prothrombin Time: 19.3 seconds — ABNORMAL HIGH (ref 11.6–15.2)

## 2015-07-02 LAB — SURGICAL PCR SCREEN
MRSA, PCR: NEGATIVE
Staphylococcus aureus: NEGATIVE

## 2015-07-02 LAB — LACTATE DEHYDROGENASE: LDH: 311 U/L — AB (ref 98–192)

## 2015-07-02 LAB — BASIC METABOLIC PANEL
ANION GAP: 9 (ref 5–15)
BUN: 22 mg/dL — ABNORMAL HIGH (ref 6–20)
CO2: 24 mmol/L (ref 22–32)
Calcium: 8.8 mg/dL — ABNORMAL LOW (ref 8.9–10.3)
Chloride: 97 mmol/L — ABNORMAL LOW (ref 101–111)
Creatinine, Ser: 1.04 mg/dL (ref 0.61–1.24)
GFR calc Af Amer: >60 mL/min (ref >60)
Glucose, Bld: 146 mg/dL — ABNORMAL HIGH (ref 65–99)
POTASSIUM: 5.1 mmol/L (ref 3.5–5.1)
SODIUM: 130 mmol/L — AB (ref 135–145)

## 2015-07-02 LAB — ALBUMIN: Albumin: 2.3 g/dL — ABNORMAL LOW (ref 3.5–5.0)

## 2015-07-02 LAB — APTT
APTT: 49 s — AB (ref 24–37)
aPTT: 51 seconds — ABNORMAL HIGH (ref 24–37)

## 2015-07-02 LAB — PROTEIN, TOTAL: Total Protein: 5.9 g/dL — ABNORMAL LOW (ref 6.5–8.1)

## 2015-07-02 MED ORDER — SODIUM CHLORIDE 0.9 % IV SOLN
INTRAVENOUS | Status: DC
  Administered 2015-07-02: 16:00:00 via INTRAVENOUS

## 2015-07-02 MED ORDER — SODIUM CHLORIDE 0.9 % IV SOLN: 250.0000 mL | INTRAVENOUS | Status: DC | PRN

## 2015-07-02 MED ORDER — SODIUM CHLORIDE 0.9 % IV SOLN
250.0000 mL | INTRAVENOUS | Status: DC | PRN
  Administered 2015-07-04: 16:00:00 via INTRAVENOUS

## 2015-07-02 MED ORDER — OXYCODONE-ACETAMINOPHEN 5-325 MG PO TABS: 0.5000 | ORAL_TABLET | ORAL | Status: DC

## 2015-07-02 MED ORDER — HEPARIN (PORCINE) IN NACL 100-0.45 UNIT/ML-% IJ SOLN
1600.0000 [IU]/h | INTRAMUSCULAR | Status: DC
  Administered 2015-07-02: 1300 [IU]/h via INTRAVENOUS
  Filled 2015-07-02 (×2): qty 250

## 2015-07-02 MED ORDER — OXYCODONE HCL 20 MG/ML PO CONC
5.0000 mg | ORAL | Status: DC | PRN
  Administered 2015-07-02: 5 mg via ORAL
  Filled 2015-07-02: qty 1

## 2015-07-02 MED ORDER — OXYCODONE-ACETAMINOPHEN 5-325 MG PO TABS: 0.5000 | ORAL_TABLET | ORAL | Status: DC | PRN

## 2015-07-02 MED ORDER — DEXTROSE 5 % IV SOLN
1.5000 g | INTRAVENOUS | Status: AC
  Administered 2015-07-03: 1.5 g via INTRAVENOUS
  Filled 2015-07-02: qty 1.5

## 2015-07-02 MED ORDER — LORAZEPAM 1 MG PO TABS
1.0000 mg | ORAL_TABLET | Freq: Every evening | ORAL | Status: DC | PRN
  Administered 2015-07-02 – 2015-07-04 (×2): 1 mg via ORAL
  Filled 2015-07-02 (×2): qty 1

## 2015-07-02 MED ORDER — LORAZEPAM 0.5 MG PO TABS: 0.5000 mg | ORAL_TABLET | Freq: Four times a day (QID) | ORAL | Status: DC | PRN

## 2015-07-02 NOTE — Consult Note (Signed)
CARDIOLOGY CONSULT NOTE   Patient ID: Joel Miller MRN: [id number], DOB/AGE: 05/14/1956   Admit date: 07/01/2015 Date of Consult: 07/02/2015 Reason for  Consult: Pericardial Effusion   Primary Physician: [name], MD Primary Cardiologist: New - Dr. Johnsie Cancel  HPI: Joel Miller is a 59 y.o. 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  He underwent transhiatal total esophagectomy with bronchoscopy on 09/05/2014 by Dr. Servando Snare. Noted to have bilateral pleural effusions in 05/2015 with CTA showing new mediastinal and juxtaphrenic lymphadenopathy, left pulmonary nodules and a left adrenal nodule all most consistent with metastatic disease.He underwent thoracentesis on 05/15/2015 with 1567mL of amber colored pleural fluid removed with cytology report showing mixed inflammatory cells. He had a right Pleurx catheter placed on 05/24/2015 by Dr. Servando Snare.  He has since undergone four cycles of Taxol/Herceptin therapy. On 07/01/2015 it was noted he had a pulmonary embolism on his CTA. In regards to his worsening dysphasia, an esophagogram on 07/01/2015 showed a stricture at the pylorus. He is being evaluated by GI with plans to perform an EGD with possible dilation vs. stent intervention in the near future.   CTA also demonstrated a "sizeable pericardial effusion" and he had an echocardiogram on 07/02/2015 showing a large, free-flowing pericardial effusion, circumferential to the heart with no evidence of hemodynamic compromise.  Patient denies any chest pain, palpitations, or dyspnea. Reports having "shallow breathing" but says this has been present for months. BP has been stable at 111/77 - 128/102 in the past 24  hours. HR has been in the 90's -120's in the past 24 hours, and the patient reports it has been elevated into the 120's since August.   Problem List Past Medical History  Diagnosis Date  . Hyperlipidemia   . Obesity     lost 80 lbs  . Hypogonadism male   . Vitamin D deficiency   . Esophageal cancer ([place]) 05/18/14    Distal  . Allergy     HEY FEVER  . GERD (gastroesophageal reflux disease)     pt feels it was related to cancer  . S/P radiation therapy 06/11/2014-07/18/2014  . Hypertension     no med in 39months due to weight loss  . Diabetes mellitus without complication ([place])     type II no med 60 days   . Pleural effusion     Past Surgical History  Procedure Laterality Date  . Colonoscopy    . 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 Complete esophagectomy N/A 09/05/2014    Procedure: TRANSHIATAL TOTAL ESOPHAGECTOMY; Cervical esophagogastrostomy;  Surgeon: [name], MD;  Location: [facility];  Service: Thoracic;  Laterality: N/A;  . Video bronchoscopy N/A 09/05/2014    Procedure: VIDEO BRONCHOSCOPY;  Surgeon: [name], MD;  Location: [facility];  Service: Thoracic;  Laterality: N/A;  . Jejunostomy N/A 09/05/2014    Procedure: FEEDING JEJUNOSTOMY;  Surgeon: [name], MD;  Location: [facility];  Service: Thoracic;  Laterality: N/A;  . Pyloroplasty N/A 09/05/2014    Procedure: PYLOROPLASTY;  Surgeon: [name], MD;  Location: [facility];  Service: Thoracic;  Laterality: N/A;  . Wisdom tooth extraction      2010  . Chest tube insertion Right 05/24/2015    Procedure:  INSERTION PLEURAL DRAINAGE CATHETER;  Surgeon: [name], MD;  Location: [facility];  Service: Thoracic;  Laterality: Right;     Allergies Allergies  Allergen Reactions  . Ace Inhibitors     cough      Inpatient Medications . insulin aspart  0-9 Units Subcutaneous TID WC  . predniSONE  10 mg Oral Q breakfast  . sodium chloride  3 mL Intravenous Q12H  . sodium chloride  3 mL Intravenous Q12H     Family History Family History  Problem Relation Age of Onset  . Hypertension Mother   . Alzheimer's disease Mother   . Cancer Father 69    throat  . Prostate cancer Brother   . Colon cancer Neg Hx   . Colon polyps Neg Hx      Social History Social History   Social History  . Marital Status: Married    Spouse Name: N/A  . Number of Children: 3  . Years of Education: N/A   Occupational History  . Sales Development worker, community    Social History Main Topics  . Smoking status: Former Smoker -- 0.25 packs/day for 3 years    Types: Cigars  . Smokeless tobacco: Never Used     Comment: no cigars for one year  . Alcohol Use: 1.2 oz/week    2 Standard drinks or equivalent per week     Comment: social  . Drug Use: No  . Sexual Activity: Not on file   Other Topics Concern  . Not on file   Social History Narrative   Married, wife Vaughan Basta     Review of Systems General:  No chills, fever, night sweats. Positive for weight changes and appetite changes Cardiovascular:  No chest pain, dyspnea on exertion, edema, orthopnea, palpitations, paroxysmal nocturnal dyspnea. Dermatological: No rash, lesions/masses Respiratory: Positive for cough and dyspnea Urologic: No hematuria, dysuria Abdominal:   No nausea, vomiting, diarrhea, bright red blood per rectum, melena, or hematemesis Neurologic:  No visual changes, wkns, changes in mental status. All other systems reviewed and are otherwise negative except as noted above.  Physical Exam  Blood pressure 111/77, pulse 115, temperature 97.4 F (36.3 C), temperature source Oral, resp. rate 20, height 5\' 10"  (1.778 m), weight 173 lb (78.472 kg), SpO2 94 %.  General: Pleasant, Caucasian male in NAD Psych: Normal affect. Neuro: Alert and oriented X 3. Moves all extremities spontaneously. HEENT: Normal  Neck: Supple without bruits or JVD. Lungs:  Resp regular and unlabored, CTA without wheezing or rales. Pleurx catheter in place on right  side. Heart: RRR no s3, s4, or murmurs. Abdomen: Soft, non-tender, non-distended, BS + x 4.  Extremities: No clubbing or cyanosis. Trace edema. DP/PT/Radials 2+ and equal bilaterally.  Labs No results for input(s): CKTOTAL, CKMB, TROPONINI in the last 72 hours. Lab Results  Component Value Date   WBC 7.1 07/02/2015   HGB 10.2* 07/02/2015   HCT 30.9* 07/02/2015   MCV 90.1 07/02/2015   PLT 424* 07/02/2015    Recent Labs Lab 07/01/15 1028 07/02/15 0336  NA 130* 130*  K 5.2* 5.1  CL  --  97*  CO2 24 24  BUN 18.4 22*  CREATININE 1.0 1.04  CALCIUM 9.8 8.8*  PROT 6.4  --   BILITOT 0.51  --   ALKPHOS 130  --   ALT 22  --   AST 29  --   GLUCOSE 164* 146*   Lab Results  Component Value Date   CHOL 134 04/25/2015  HDL 41 04/25/2015   LDLCALC 79 04/25/2015   TRIG 72 04/25/2015   Radiology/Studies Dg Chest 2 View: 06/13/2015  CLINICAL DATA:  History of esophageal malignancy with surgery in January of 2016, history of pleural effusions, currently asymptomatic. EXAM: CHEST  2 VIEW COMPARISON:  Portable chest x-ray of May 24, 2015 FINDINGS: There remains a smaller moderate-sized right pleural effusion. The small caliber right chest tube is unchanged in position at the inferior aspect of the pleural space. A small left pleural effusion has increased in size since September. There is no pneumothorax. The pulmonary interstitial markings are slightly more conspicuous today especially on the left. The heart is normal in size. The pulmonary vascularity is not engorged. The bony thorax exhibits no acute abnormality. IMPRESSION: 1. Mildly increased pulmonary interstitial markings especially on the left since the previous study. This may reflect interstitial edema or early interstitial pneumonia. 2. Stable small a moderate sized right pleural effusion with stable small caliber chest tube. Slight interval increase in the volume of the small left pleural effusion. Electronically Signed   By:  David  Martinique M.D.   On: 06/13/2015 14:22   Ct Angio Chest Pe W/cm &/or Wo Cm: 07/01/2015  ADDENDUM REPORT: 07/01/2015 15:55 ADDENDUM: The left pleural effusion is larger than the right pleural effusion. The statement in the text is error; the statement in the impression is correct. Electronically Signed   By: Lowella Grip III M.D.   On: 07/01/2015 15:55  07/01/2015  CLINICAL DATA:  Shortness of Breath.  Esophageal carcinoma EXAM: CT ANGIOGRAPHY CHEST WITH CONTRAST TECHNIQUE: Multidetector CT imaging of the chest was performed using the standard protocol during bolus administration of intravenous contrast. Multiplanar CT image reconstructions and MIPs were obtained to evaluate the vascular anatomy. CONTRAST:  118mL OMNIPAQUE IOHEXOL 350 MG/ML SOLN COMPARISON:  Chest CT May 14, 2015; chest radiograph June 13, 2015 FINDINGS: There are focal pulmonary emboli in the proximal lingular pulmonary artery branches causing partial obstruction. No other focal pulmonary emboli are appreciable. No more central pulmonary embolus seen. No evidence of right heart strain. There is no thoracic aortic aneurysm or dissection. Visualized great vessels appear unremarkable. There are bilateral pleural effusions, larger on the right than on the left. There is a chest tube at the right base with loculation of fluid on the right. There is patchy atelectasis in both lower lobes, more on the right than on the left. There is distention of the upper esophagus with an abrupt narrowing of the esophagus at the level of the carina. There is soft tissue fullness in this area, likely due to mass in the region of the esophagus at and below the carina. Thyroid appears unremarkable. There are multiple mildly prominent mediastinal lymph nodes, largest measuring 2.0 x 1.7 cm in the aortopulmonary window region. There is a sizable pericardial effusion. In the visualized upper abdomen, there are stable adrenal masses bilaterally. The larger  masses on the left measuring 2.4 x 1.3 cm. There is hepatic steatosis. There is degenerative change in the thoracic spine. No well-defined sclerotic or lytic bone lesions are identified. Review of the MIP images confirms the above findings. IMPRESSION: There is a focal pulmonary emboli in the proximal lingular pulmonary artery branches. No larger pulmonary emboli identified. No right heart strain. Bilateral pleural effusions, larger on the left than on the right. Areas of patchy atelectasis in both lung bases. Pleural drainage catheter present on the right. Sizable pericardial effusion present. Several small aortopulmonary window lymph nodes present. Mass  in the esophagus at the level of the carina. Hepatic steatosis. Stable adrenal masses bilaterally. Critical Value/emergent results were called by telephone at the time of interpretation on 07/01/2015 at 2:42 pm to [facility], NP , who verbally acknowledged these results. Electronically Signed: By: Lowella Grip III M.D. On: 07/01/2015 14:42   Dg Esophagus: 07/01/2015  CLINICAL DATA:  History of esophageal cancer status post total esophagectomy. Additional surgical history of jejunostomy and pyloroplasty. EXAM: ESOPHOGRAM/BARIUM SWALLOW TECHNIQUE: Single contrast examination was performed using  thin barium. FLUOROSCOPY TIME:  If the device does not provide the exposure index: Fluoroscopy Time:  1 minutes 43 seconds Number of Acquired Images:  28 COMPARISON:  Chest CT from earlier same day. FINDINGS: Initial swallows show evidence of patient's esophagectomy and gastric pull-up. Contrast moved promptly through the gastric pull-up to the level of the diaphragm. No evidence of intrinsic or extrinsic wall irregularities within the upper or mid portions of the gastric pull-up. A persistent narrowing was then identified in the region of the gastric pylorus. When contrast reached this area, patient did have coughing fits and slight regurgitation. Eventually,  contrast did slowly pass from the slightly distended gastric pull-up to the proximal small bowel. No extraluminal contrast material seen. IMPRESSION: Persistent high grade narrowing in the region of the gastric pylorus. The smooth narrowing is more likely a benign stricture caused by thickening related to the previous pyloroplasty than due to a neoplastic cause. Appearance also is not suggestive of extrinsic narrowing. Electronically Signed   By: Franki Cabot M.D.   On: 07/01/2015 16:14   ECHOCARDIOGRAM: 07/02/2015 Study Conclusions - Left ventricle: The cavity size was normal. Wall thickness was normal. Systolic function was normal. The estimated ejection fraction was in the range of 50% to 55%. Wall motion was normal; there were no regional wall motion abnormalities. Doppler parameters are consistent with abnormal left ventricular relaxation (grade 1 diastolic dysfunction). - Aortic valve: There was trivial regurgitation. - Pericardium, extracardiac: A large, free-flowing pericardial effusion was identified circumferential to the heart. There was no evidence of hemodynamic compromise.  ASSESSMENT AND PLAN  1. Pericardial Effusion - Echo on 07/02/2015 showed EF of 50% - 64%, grade 1 diastolic dysfunction, and a large, free-flowing pericardial effusion was identified circumferential to the heart with no evidence of hemodynamic compromise. - Dr. Servando Snare with [facility] has been consulted for further management of the patient's pericardial effusion.  2. Acute Pulmonary Embolism - currently on IV Heparin - per admitting team  3. Bilateral Pleural Effusions - s/p right Pleurx catheter placed on 05/24/2015 by Dr. Servando Snare.  4. Carcinoma of the distal third of the esophagus - Oncology team following.   Signed, Erma Heritage, PA-C 07/02/2015, 1:23 PM Pager: [phone]  Patient examined chart reviewed.  Discussed care with Dr Servando Snare and patient Metastatic esophageal  CA. Previous pleur X right side with no definitive pathology Now with new PE, increasing left sided effusion and pericardial effusion.  Both  Likely malignant.  Exam with positive Kussmaul sign  Decreased BS mid and basal Left lung, pleur X catheter on right side and plus 2 LE edema.  Will start heparin drip Will go to OR in am for pericardial window and insertion of left pleur X catheter.  Will Need to stop heparin an hour before OR.    Jenkins Rouge

## 2015-07-02 NOTE — Progress Notes (Signed)
ANTICOAGULATION CONSULT NOTE - Initial Consult  Pharmacy Consult for IV heparin Indication: PE  Allergies  Allergen Reactions  . Ace Inhibitors     cough    Patient Measurements: Height: 5\' 10"  (177.8 cm) Weight: 173 lb (78.472 kg) IBW/kg (Calculated) : 73 Heparin Dosing Weight: 78kg  Vital Signs: Temp: 97.4 F (36.3 C) (10/25 0527) Temp Source: Oral (10/25 0527) BP: 111/77 mmHg (10/25 0527) Pulse Rate: 115 (10/25 0527)  Labs:  Recent Labs  07/01/15 1028 07/01/15 1028 07/02/15 0336  HGB 10.8*  --  10.2*  HCT 32.2*  --  30.9*  PLT 503*  --  424*  APTT  --   --  49*  LABPROT  --   --  18.8*  INR  --   --  1.57*  CREATININE  --  1.0 1.04    Estimated Creatinine Clearance: 78 mL/min (by C-G formula based on Cr of 1.04).   Medical History: Past Medical History  Diagnosis Date  . Hyperlipidemia   . Obesity     lost 80 lbs  . Hypogonadism male   . Vitamin D deficiency   . Esophageal cancer ([place]) 05/18/14    Distal  . Allergy     HEY FEVER  . GERD (gastroesophageal reflux disease)     pt feels it was related to cancer  . S/P radiation therapy 06/11/2014-07/18/2014  . Hypertension     no med in 109months due to weight loss  . Diabetes mellitus without complication ([place])     type II no med 60 days   . Pleural effusion     Assessment: 23 yoM with recently diagnosed with metastatic esophageal cancer s/p esophagectomy and 4 cycles chemotherapy (herceptin / taxol) admitted from [facility] with worsening cough and lower extremity edema.  CT angio positive for PE, bilateral pleural effusion, and pericardial effusion. Lovenox 1.5mg /kg administered x1 on 10/24.  Pharmacy consulted to start IV heparin in anticipation of EGD in the next few days.  GI and CTS consulted and plan for pt to transfer to [facility].    10/25:  LMWH 1.5mg /kg (120mg ) SQ x 1 on 10/24 @ 1726.  Stable renal function, CrCl ~78 ml/min.  Hgb decreased, stable.  Plts elevated. No bleeding issues per RN.  APTT,  PT/INR sl elevated following lovenox administration PTA ASA 81mg  daily, PRN naproxen (last dose 2 months ago)  Goal of Therapy:  Anti-Xa level 0.6-1 units/ml 4hrs after LMWH dose given Monitor platelets by anticoagulation protocol: Yes   Plan:  Start IV heparin at 1300 units/hr = 13 ml/hr without bolus.  Time to begin at 1600 on 10/25 (~22.5 hours after LMWH administration).   F/u 6 hour HL at 2200.   F/u EGD plans and long-term anticoagulation afterwards.     Ralene Bathe, PharmD, BCPS 07/02/2015, 1:02 PM  Pager: [phone]

## 2015-07-02 NOTE — Progress Notes (Signed)
ANTICOAGULATION CONSULT NOTE - Follow Up Consult  Pharmacy Consult for heparin Indication: pulmonary embolus  Allergies  Allergen Reactions  . Ace Inhibitors     cough    Patient Measurements: Height: 5\' 10"  (177.8 cm) Weight: 173 lb (78.472 kg) IBW/kg (Calculated) : 73 Heparin Dosing Weight: 78 kg  Vital Signs: Temp: 97.8 F (36.6 C) (10/25 2125) Temp Source: Oral (10/25 2125) BP: 124/94 mmHg (10/25 2125) Pulse Rate: 104 (10/25 2125)  Labs:  Recent Labs  07/01/15 1028  07/02/15 0336 07/02/15 1630 07/02/15 2050  HGB  --   < > 10.2* 10.1* 10.2*  HCT  --   --  30.9* 30.4* 31.3*  PLT  --   --  424* 476* 423*  APTT  --   --  49*  --  51*  LABPROT  --   --  18.8*  --  19.3*  INR  --   --  1.57*  --  1.62*  CREATININE 1.0  --  1.04  --   --   < > = values in this interval not displayed.  Estimated Creatinine Clearance: 78 mL/min (by C-G formula based on Cr of 1.04).   Medications:  Scheduled:  . [START ON 07/03/2015] cefUROXime (ZINACEF)  IV  1.5 g Intravenous To OR  . insulin aspart  0-9 Units Subcutaneous TID WC  . predniSONE  10 mg Oral Q breakfast  . sodium chloride  3 mL Intravenous Q12H  . sodium chloride  3 mL Intravenous Q12H   Infusions:  . sodium chloride 50 mL/hr at 07/02/15 1700  . heparin 1,300 Units/hr (07/02/15 1554)    Assessment: 59 yo male with PE is currently on subtherapeutic heparin based on his aPTT of 51.  No problem with infusion per RN  Goal of Therapy:  aPTT 66-102 seconds which correlates to a heparin level of 0.3-0.7 Monitor platelets by anticoagulation protocol: Yes   Plan:  - Increase heparin to 1450 units/hr - 6hr heparin level  Peityn Payton, Tsz-Yin 07/02/2015,9:31 PM

## 2015-07-02 NOTE — Progress Notes (Signed)
IP PROGRESS NOTE  Subjective:   No new complaint.  Objective: Vital signs in last 24 hours: Blood pressure 111/77, pulse 115, temperature 97.4 F (36.3 C), temperature source Oral, resp. rate 20, height 5' 10"  (1.778 m), weight 173 lb (78.472 kg), SpO2 94 %.  Intake/Output from previous day: 10/24 0701 - 10/25 0700 In: 130 [P.O.:120; I.V.:10] Out: -   Physical Exam:  Lungs: Decreased breath sounds at the posterior chest bilaterally, rub over the left upper chest, no respiratory distress Cardiac: Regular rate and rhythm Abdomen: No hepatomegaly Extremities: Trace low leg edema bilaterally    Lab Results:  Recent Labs  07/01/15 1028 07/02/15 0336  WBC 7.3 7.1  HGB 10.8* 10.2*  HCT 32.2* 30.9*  PLT 503* 424*    BMET  Recent Labs  07/01/15 1028 07/02/15 0336  NA 130* 130*  K 5.2* 5.1  CL  --  97*  CO2 24 24  GLUCOSE 164* 146*  BUN 18.4 22*  CREATININE 1.0 1.04  CALCIUM 9.8 8.8*    Studies/Results: Ct Angio Chest Pe W/cm &/or Wo Cm  07/01/2015  ADDENDUM REPORT: 07/01/2015 15:55 ADDENDUM: The left pleural effusion is larger than the right pleural effusion. The statement in the text is error; the statement in the impression is correct. Electronically Signed   By: Lowella Grip III M.D.   On: 07/01/2015 15:55  07/01/2015  CLINICAL DATA:  Shortness of Breath.  Esophageal carcinoma EXAM: CT ANGIOGRAPHY CHEST WITH CONTRAST TECHNIQUE: Multidetector CT imaging of the chest was performed using the standard protocol during bolus administration of intravenous contrast. Multiplanar CT image reconstructions and MIPs were obtained to evaluate the vascular anatomy. CONTRAST:  181m OMNIPAQUE IOHEXOL 350 MG/ML SOLN COMPARISON:  Chest CT May 14, 2015; chest radiograph June 13, 2015 FINDINGS: There are focal pulmonary emboli in the proximal lingular pulmonary artery branches causing partial obstruction. No other focal pulmonary emboli are appreciable. No more central  pulmonary embolus seen. No evidence of right heart strain. There is no thoracic aortic aneurysm or dissection. Visualized great vessels appear unremarkable. There are bilateral pleural effusions, larger on the right than on the left. There is a chest tube at the right base with loculation of fluid on the right. There is patchy atelectasis in both lower lobes, more on the right than on the left. There is distention of the upper esophagus with an abrupt narrowing of the esophagus at the level of the carina. There is soft tissue fullness in this area, likely due to mass in the region of the esophagus at and below the carina. Thyroid appears unremarkable. There are multiple mildly prominent mediastinal lymph nodes, largest measuring 2.0 x 1.7 cm in the aortopulmonary window region. There is a sizable pericardial effusion. In the visualized upper abdomen, there are stable adrenal masses bilaterally. The larger masses on the left measuring 2.4 x 1.3 cm. There is hepatic steatosis. There is degenerative change in the thoracic spine. No well-defined sclerotic or lytic bone lesions are identified. Review of the MIP images confirms the above findings. IMPRESSION: There is a focal pulmonary emboli in the proximal lingular pulmonary artery branches. No larger pulmonary emboli identified. No right heart strain. Bilateral pleural effusions, larger on the left than on the right. Areas of patchy atelectasis in both lung bases. Pleural drainage catheter present on the right. Sizable pericardial effusion present. Several small aortopulmonary window lymph nodes present. Mass in the esophagus at the level of the carina. Hepatic steatosis. Stable adrenal masses bilaterally. Critical Value/emergent  results were called by telephone at the time of interpretation on 07/01/2015 at 2:42 pm to [facility], NP , who verbally acknowledged these results. Electronically Signed: By: Lowella Grip III M.D. On: 07/01/2015 14:42   Dg  Esophagus  07/01/2015  CLINICAL DATA:  History of esophageal cancer status post total esophagectomy. Additional surgical history of jejunostomy and pyloroplasty. EXAM: ESOPHOGRAM/BARIUM SWALLOW TECHNIQUE: Single contrast examination was performed using  thin barium. FLUOROSCOPY TIME:  If the device does not provide the exposure index: Fluoroscopy Time:  1 minutes 43 seconds Number of Acquired Images:  28 COMPARISON:  Chest CT from earlier same day. FINDINGS: Initial swallows show evidence of patient's esophagectomy and gastric pull-up. Contrast moved promptly through the gastric pull-up to the level of the diaphragm. No evidence of intrinsic or extrinsic wall irregularities within the upper or mid portions of the gastric pull-up. A persistent narrowing was then identified in the region of the gastric pylorus. When contrast reached this area, patient did have coughing fits and slight regurgitation. Eventually, contrast did slowly pass from the slightly distended gastric pull-up to the proximal small bowel. No extraluminal contrast material seen. IMPRESSION: Persistent high grade narrowing in the region of the gastric pylorus. The smooth narrowing is more likely a benign stricture caused by thickening related to the previous pyloroplasty than due to a neoplastic cause. Appearance also is not suggestive of extrinsic narrowing. Electronically Signed   By: Franki Cabot M.D.   On: 07/01/2015 16:14    Medications: I have reviewed the patient's current medications.  Assessment/Plan:  1. Adenocarcinoma the esophagus/GE junction-status post an endoscopy 05/18/2014 confirming a gastric cardia/lower esophageal mass with tumor extending proximally in the esophagus to 25 cm from the incisors  HER-2/neu amplified   Staging CT scan 05/22/2014 with indeterminate bilateral pulmonary nodules; and prominent paraesophageal, porta hepatis, and para-aortic lymph nodes   Staging PET scan 05/29/2014 confirmed  hypermetabolic soft tissue thickening at the distal third of the esophagus extending into the proximal stomach. 2 additional smaller areas of hypermetabolism or noted in the more proximal esophagus with a mildly enlarged hypermetabolic gastrohepatic node.   Initiation of radiation and concurrent Taxol/carboplatin 06/11/2014; Taxol/carboplatin completed 07/09/2014; radiation completed 07/18/2014.  Esophagectomy/jejunostomy feeding tube placement 09/05/2014 with the pathology revealing [id number] tumor, HER-2/neu amplified, positive distal resection margin   Cycle 1 FOLFOX 11/01/2014  Cycle 2 FOLFOX 11/15/2014  Cycle 3 FOLFOX 11/29/2014  Cycle 4 FOLFOX 12/13/2014  Cycle 5 FOLFOX 12/27/2014  Cycle 6 FOLFOX 01/17/2015  CTs of the chest, abdomen, and pelvis on 02/01/2015-negative for recurrent esophagus cancer  CT of the chest 05/14/2015 revealed a large right pleural effusion,small left effusion, left lung nodules, mediastinal lymphadenopathy, and a left adrenal metastasis  Placement of a right Pleurx catheter 05/24/2015  Echocardiogram 05/31/2015-LVEF 60-65%  Cycle 1 Taxol/Herceptin 06/04/2015  Cycle 2 Taxol/Herceptin 06/10/2015  Cycle 3 Taxol/Herceptin 06/17/2015  Cycle 4 Taxol/Herceptin 06/24/2015  2. History of anorexia/weight loss 3. Diabetes  4. Hypertension  5. Hyperlipidemia  6. Back pain-potentially related to chronic spine disease versus metastases. He continues Percocet as needed 7. Solid/liquid dysphasia-esophagram 07/01/2015 confirmed a stricture near the pylorus 8. Pulmonary embolism confirmed on a CT 07/01/2015-Lovenox initiated, now maintained on heparin 9. Pericardial effusion and progressive left pleural effusion on the CT 07/01/2015  Mr. Mcmillon appears stable. Dr. Carlean Purl will evaluate him today to consider an endoscopy for evaluation of the stricture. I added IV fluids and converted some of his medications to sublingual since he is unable to take  pills.  He will undergo an echocardiogram to evaluate the significance of the pericardial effusion. Dr. Servando Snare will decide on draining the effusion and left pleural effusion.  I will continue discussion regarding goals of care and the indication for further systemic therapy with Mr. Shallenberger and his wife.    LOS: 1 day   Meli Faley  07/02/2015, 9:35 AM

## 2015-07-02 NOTE — Consult Note (Signed)
Joel Miller       Joel Miller,Joel Miller [id number]             [phone]        Joel Miller [place] Medical Record #[id number] Date of Birth: 1956-06-29  Referring: No ref. provider found Primary Care: Joel Richards, Joel Miller  Chief Complaint:  PE, Pericardial effusion and pleural effusion bilaterai  History of Present Illness:     past medical history of HTN, HLD, Esophageal Cancer (diagnosed via endoscopy 05/2014 with carcinoma of the distal third of the esophagus, now with metastasis to the lung and left adrenal gland), and Type 2 DM admitted on 07/01/2015 for management of newly diagnosed pulmonary embolism and worsening dysphagia. He had He underwent transhiatal total esophagectomy with bronchoscopy on 09/05/2014  Right effusion treated with pleurix,   Current Activity/ Functional Status: Patient is independent with mobility/ambulation, transfers, ADL's, IADL's.   Zubrod Score: At the time of surgery this patient's most appropriate activity status/level should be described as: []     0    Normal activity, no symptoms []     1    Restricted in physical strenuous activity but ambulatory, able to do out light work [x]     2    Ambulatory and capable of self care, unable to do work activities, up and about                 more than 50%  Of the time                            []     3    Only limited self care, in bed greater than 50% of waking hours []     4    Completely disabled, no self care, confined to bed or chair []     5    Moribund  Past Medical History  Diagnosis Date  . Hyperlipidemia   . Obesity     lost 80 lbs  . Hypogonadism male   . Vitamin D deficiency   . Esophageal cancer ([place]) 05/18/14    Distal  . Allergy     HEY FEVER  . GERD (gastroesophageal reflux disease)     pt feels it was related to cancer  . S/P radiation therapy 06/11/2014-07/18/2014  . Hypertension     no med in 49months due to weight loss  . Diabetes mellitus without  complication ([place])     type II no med 60 days   . Pleural effusion     Past Surgical History  Procedure Laterality Date  . Colonoscopy    . 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 Complete esophagectomy N/A 09/05/2014    Procedure: TRANSHIATAL TOTAL ESOPHAGECTOMY; Cervical esophagogastrostomy;  Surgeon: Joel Isaac, Joel Miller;  Location: [place];  Service: Thoracic;  Laterality: N/A;  . Video bronchoscopy N/A 09/05/2014    Procedure: VIDEO BRONCHOSCOPY;  Surgeon: Joel Isaac, Joel Miller;  Location: [place];  Service: Thoracic;  Laterality: N/A;  . Jejunostomy N/A 09/05/2014    Procedure: FEEDING JEJUNOSTOMY;  Surgeon: Joel Isaac, Joel Miller;  Location: [place];  Service: Thoracic;  Laterality: N/A;  . Pyloroplasty N/A 09/05/2014    Procedure: PYLOROPLASTY;  Surgeon: Joel Isaac, Joel Miller;  Location: [place];  Service: Thoracic;  Laterality: N/A;  . Wisdom tooth extraction      2010  . Chest tube insertion Right 05/24/2015    Procedure:  INSERTION PLEURAL DRAINAGE CATHETER;  Surgeon: Joel Isaac, Joel Miller;  Location: [place];  Service: Thoracic;  Laterality: Right;    History  Smoking status  . Former Smoker -- 0.25 packs/day for 3 years  . Types: Cigars  Smokeless tobacco  . Never Used    Comment: no cigars for one year    History  Alcohol Use  . 1.2 oz/week  . 2 Standard drinks or equivalent per week    Comment: social    Social History   Social History  . Marital Status: Married    Spouse Name: N/A  . Number of Children: 3  . Years of Education: N/A   Occupational History  . Sales Development worker, community    Social History Main Topics  . Smoking status: Former Smoker -- 0.25 packs/day for 3 years    Types: Cigars  . Smokeless tobacco: Never Used     Comment: no cigars for one year  . Alcohol Use: 1.2 oz/week    2 Standard drinks or equivalent per week     Comment: social  . Drug Use: No  . Sexual Activity: Not on file   Other Topics Concern  . Not on file   Social History Narrative    Married, wife Joel Miller    Allergies  Allergen Reactions  . Ace Inhibitors     cough    Current Facility-Administered Medications  Medication Dose Route Frequency Provider Last Rate Last Dose  . 0.9 %  sodium chloride infusion  250 mL Intravenous PRN Joel Lis, Joel Miller      . 0.9 %  sodium chloride infusion   Intravenous Continuous Joel Lis, Joel Miller 50 mL/hr at 07/02/15 1700    . benzonatate (TESSALON) capsule 200 mg  200 mg Oral Q8H PRN Joel Dickens, Joel Miller   200 mg at 07/01/15 2134  . guaiFENesin (MUCINEX) 12 hr tablet 1,200 mg  1,200 mg Oral BID PRN Joel Dickens, Joel Miller      . heparin ADULT infusion 100 units/mL (25000 units/250 mL)  1,300 Units/hr Intravenous Continuous Joel Miller, Joel Miller 13 mL/hr at 07/02/15 1554 1,300 Units/hr at 07/02/15 1554  . insulin aspart (novoLOG) injection 0-9 Units  0-9 Units Subcutaneous TID WC Joel Dickens, Joel Miller   1 Units at 07/02/15 1154  . LORazepam (ATIVAN) tablet 0.5 mg  0.5 mg Sublingual Q6H PRN Joel Pier, Joel Miller      . ondansetron [facility]) tablet 4 mg  4 mg Oral Q6H PRN Joel Dickens, Joel Miller       Or  . ondansetron [facility]) injection 4 mg  4 mg Intravenous Q6H PRN Joel Dickens, Joel Miller   4 mg at 07/02/15 1051  . oxyCODONE (ROXICODONE INTENSOL) 20 MG/ML concentrated solution 5 mg  5 mg Oral Q4H PRN Joel Pier, Joel Miller      . predniSONE (DELTASONE) tablet 10 mg  10 mg Oral Q breakfast Joel Dickens, Joel Miller   10 mg at 07/02/15 1003  . sodium chloride 0.9 % injection 3 mL  3 mL Intravenous Q12H Joel Dickens, Joel Miller   3 mL at 07/02/15 1000  . sodium chloride 0.9 % injection 3 mL  3 mL Intravenous Q12H Joel Dickens, Joel Miller   3 mL at 07/01/15 2128  . sodium chloride 0.9 % injection 3 mL  3 mL Intravenous PRN Joel Dickens, Joel Miller        Prescriptions prior to admission  Medication Sig Dispense Refill Last Dose  .  benzonatate (TESSALON) 200 MG capsule Take 200 mg by mouth every 4 (four) hours as needed.  0 07/01/2015 at Unknown time  . Naproxen Sodium 220 MG CAPS  Take 1 capsule by mouth daily as needed (pain).   2 months  . oxazepam (SERAX) 15 MG capsule TAKE 1 TO 2 CAPSULES BY MOUTH 1 HOUR PRIOR TO BEDTIME FOR SLEEP IF NEEDED. *STOP TAKING ZOLPIDEM* 60 capsule 2 06/30/2015 at Unknown time  . oxyCODONE-acetaminophen (PERCOCET/ROXICET) 5-325 MG tablet Take 1 tablet by mouth every 4 (four) hours as needed for severe pain. (Patient taking differently: Take 0.5 tablets by mouth every 4 (four) hours as needed for moderate pain or severe pain. ) 60 tablet 0 07/01/2015 at 1200  . prochlorperazine (COMPAZINE) 10 MG tablet Take 1 tablet (10 mg total) by mouth every 6 (six) hours as needed for nausea. 30 tablet 1 Past Week at Unknown time  . aspirin 81 MG tablet Take 81 mg by mouth daily.   2 months  . Cholecalciferol (VITAMIN D PO) Take 10,000 Units by mouth daily.   2 months  . mirtazapine (REMERON SOL-TAB) 15 MG disintegrating tablet Take 1 tablet (15 mg total) by mouth at bedtime. (Patient not taking: Reported on 07/01/2015) 30 tablet 1 Not Taking  . Multiple Vitamin (MULTIVITAMIN) capsule Take 1 capsule by mouth daily.   2 months  . predniSONE (DELTASONE) 10 MG tablet Take 1 tablet (10 mg total) by mouth daily with breakfast. 30 tablet 1 never started    Family History  Problem Relation Age of Onset  . Hypertension Mother   . Alzheimer's disease Mother   . Cancer Father 69    throat  . Prostate cancer Brother   . Colon cancer Neg Hx   . Colon polyps Neg Hx      Review of Systems:      Cardiac Review of Systems: Y or N  Chest Pain [    ]  Resting SOB [   ] Exertional SOB  Blue.Reese  ]  Orthopnea [  ]   Pedal Edema [   ]    Palpitations [  ] Syncope  [  ]   Presyncope [   ]  General Review of Systems: [Y] = yes [  ]=no Constitional: recent weight change [  ]; anorexia [  ]; fatigue [ y ]; nausea [  ]; night sweats [  ]; fever [  ]; or chills [  ]                                                               Dental: poor dentition[  ]; Last Dentist visit:    Eye : blurred vision [  ]; diplopia [   ]; vision changes [  ];  Amaurosis fugax[  ]; Resp: cough [  ];  wheezing[  ];  hemoptysis[  ]; shortness of breath[  ]; paroxysmal nocturnal dyspnea[  ]; dyspnea on exertion[  ]; or orthopnea[  ];  GI:  gallstones[  ], vomiting[ y ];  dysphagia[ y ]; melena[  ];  hematochezia [  ]; heartburn[  ];   Hx of  Colonoscopy[  ]; GU: kidney stones [  ]; hematuria[  ];   dysuria [  ];  nocturia[  ];  history of     obstruction [  ]; urinary frequency [  ]             Skin: rash, swelling[  ];, hair loss[  ];  peripheral edema[  ];  or itching[  ]; Musculosketetal: myalgias[  ];  joint swelling[  ];  joint erythema[  ];  joint pain[  ];  back pain[  ];  Heme/Lymph: bruising[  ];  bleeding[  ];  anemia[  ];  Neuro: TIA[  ];  headaches[  ];  stroke[  ];  vertigo[  ];  seizures[  ];   paresthesias[  ];  difficulty walking[  ];  Psych:depression[  ]; anxiety[  ];  Endocrine: diabetes[  ];  thyroid dysfunction[  ];  Immunizations: Flu [  ]; Pneumococcal[  ];  Other:  Physical Exam: BP 118/88 mmHg  Pulse 122  Temp(Src) 97.5 F (36.4 C) (Oral)  Resp 19  Ht 5\' 10"  (1.778 m)  Wt 173 lb (78.472 kg)  BMI 24.82 kg/m2  SpO2 97%   General appearance: alert, cooperative, appears older than stated age and mild distress Head: Normocephalic, without obvious abnormality, atraumatic Neck: no adenopathy, no carotid bruit, no JVD, supple, symmetrical, trachea midline, thyroid not enlarged, symmetric, no tenderness/mass/nodules and left neck incision healed no adenopathy Lymph nodes: Cervical, supraclavicular, and axillary nodes normal. Resp: diminished breath sounds LLL Back: symmetric, no curvature. ROM normal. No CVA tenderness. Cardio: no rub GI: soft, non-tender; bowel sounds normal; no masses,  no organomegaly Extremities: extremities normal, atraumatic, no cyanosis or edema and edema edwma both feet  Neurologic: Grossly normal  Diagnostic Studies & Laboratory  data:     Recent Radiology Findings:   Ct Angio Chest Pe W/cm &/or Wo Cm  07/01/2015  ADDENDUM REPORT: 07/01/2015 15:55 ADDENDUM: The left pleural effusion is larger than the right pleural effusion. The statement in the text is error; the statement in the impression is correct. Electronically Joel Miller   By: Lowella Grip III M.D.   On: 07/01/2015 15:55  07/01/2015  CLINICAL DATA:  Shortness of Breath.  Esophageal carcinoma EXAM: CT ANGIOGRAPHY CHEST WITH CONTRAST TECHNIQUE: Multidetector CT imaging of the chest was performed using the standard protocol during bolus administration of intravenous contrast. Multiplanar CT image reconstructions and MIPs were obtained to evaluate the vascular anatomy. CONTRAST:  149mL OMNIPAQUE IOHEXOL 350 MG/ML SOLN COMPARISON:  Chest CT May 14, 2015; chest radiograph June 13, 2015 FINDINGS: There are focal pulmonary emboli in the proximal lingular pulmonary artery branches causing partial obstruction. No other focal pulmonary emboli are appreciable. No more central pulmonary embolus seen. No evidence of right heart strain. There is no thoracic aortic aneurysm or dissection. Visualized great vessels appear unremarkable. There are bilateral pleural effusions, larger on the right than on the left. There is a chest tube at the right base with loculation of fluid on the right. There is patchy atelectasis in both lower lobes, more on the right than on the left. There is distention of the upper esophagus with an abrupt narrowing of the esophagus at the level of the carina. There is soft tissue fullness in this area, likely due to mass in the region of the esophagus at and below the carina. Thyroid appears unremarkable. There are multiple mildly prominent mediastinal lymph nodes, largest measuring 2.0 x 1.7 cm in the aortopulmonary window region. There is a sizable pericardial effusion. In the visualized upper abdomen, there are stable adrenal masses bilaterally. The larger  masses on  the left measuring 2.4 x 1.3 cm. There is hepatic steatosis. There is degenerative change in the thoracic spine. No well-defined sclerotic or lytic bone lesions are identified. Review of the MIP images confirms the above findings. IMPRESSION: There is a focal pulmonary emboli in the proximal lingular pulmonary artery branches. No larger pulmonary emboli identified. No right heart strain. Bilateral pleural effusions, larger on the left than on the right. Areas of patchy atelectasis in both lung bases. Pleural drainage catheter present on the right. Sizable pericardial effusion present. Several small aortopulmonary window lymph nodes present. Mass in the esophagus at the level of the carina. Hepatic steatosis. Stable adrenal masses bilaterally. Critical Value/emergent results were called by telephone at the time of interpretation on 07/01/2015 at 2:42 pm to [facility], Joel Miller , who verbally acknowledged these results. Electronically Joel Miller: By: Lowella Grip III M.D. On: 07/01/2015 14:42   Dg Esophagus  07/01/2015  CLINICAL DATA:  History of esophageal cancer status post total esophagectomy. Additional surgical history of jejunostomy and pyloroplasty. EXAM: ESOPHOGRAM/BARIUM SWALLOW TECHNIQUE: Single contrast examination was performed using  thin barium. FLUOROSCOPY TIME:  If the device does not provide the exposure index: Fluoroscopy Time:  1 minutes 43 seconds Number of Acquired Images:  28 COMPARISON:  Chest CT from earlier same day. FINDINGS: Initial swallows show evidence of patient's esophagectomy and gastric pull-up. Contrast moved promptly through the gastric pull-up to the level of the diaphragm. No evidence of intrinsic or extrinsic wall irregularities within the upper or mid portions of the gastric pull-up. A persistent narrowing was then identified in the region of the gastric pylorus. When contrast reached this area, patient did have coughing fits and slight regurgitation. Eventually,  contrast did slowly pass from the slightly distended gastric pull-up to the proximal small bowel. No extraluminal contrast material seen. IMPRESSION: Persistent high grade narrowing in the region of the gastric pylorus. The smooth narrowing is more likely a benign stricture caused by thickening related to the previous pyloroplasty than due to a neoplastic cause. Appearance also is not suggestive of extrinsic narrowing. Electronically Joel Miller   By: Franki Cabot M.D.   On: 07/01/2015 16:14     I have independently reviewed the above radiologic studies.  Recent Lab Findings: Lab Results  Component Value Date   WBC 8.1 07/02/2015   HGB 10.1* 07/02/2015   HCT 30.4* 07/02/2015   PLT 476* 07/02/2015   GLUCOSE 146* 07/02/2015   CHOL 134 04/25/2015   TRIG 72 04/25/2015   HDL 41 04/25/2015   LDLCALC 79 04/25/2015   ALT 22 07/01/2015   AST 29 07/01/2015   NA 130* 07/02/2015   K 5.1 07/02/2015   CL 97* 07/02/2015   CREATININE 1.04 07/02/2015   BUN 22* 07/02/2015   CO2 24 07/02/2015   TSH 1.106 04/25/2015   INR 1.57* 07/02/2015   HGBA1C 5.9* 04/25/2015      Assessment / Plan:    Plan left pleurx  and   pericardial window planned tomorrow  Then have gi do endoscopy todilate  pylorus  New dx of PE  The goals risks and alternatives of the planned surgical procedure left pleur ix and subhyoid pericardial window  have been discussed with the patient in detail. The risks of the procedure including death, infection, stroke, myocardial infarction, bleeding, blood transfusion have all been discussed specifically.  I have quoted Joel Miller a 3 % of perioperative mortality and a complication rate as high as 15 %. The patient's questions have been  answered.Joel Miller is willing  to proceed with the planned procedure. Joel Isaac Joel Miller      [place] [address] [place] [address] Office [phone]   Beeper [phone]  07/02/2015 6:58 PM

## 2015-07-02 NOTE — Progress Notes (Addendum)
Patient ID: Joel Miller, male   DOB: 23-Nov-1955, 59 y.o.   MRN: [id number] [facility] PROGRESS NOTE  Joel Miller ZTI:[id number] DOB: December 04, 1955 DOA: 07/01/2015 PCP: [name], MD  Brief narrative:    59 y.o. 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 Servando Snare, has had bilateral pleural effusions in 05/2015 with CTA showing new mediastinal and juxtaphrenic lymphadenopathy, left pulmonary nodules and a left adrenal nodule all most consistent with metastatic disease, s/p thoracentesis on 05/15/2015 with 157mL of amber colored pleural fluid removed with cytology report showing mixed inflammatory cells. He had a right Pleurx catheter placed on 05/24/2015 by Dr. Servando Snare. Since then he has undergone four cycles of Taxol/Herceptin therapy.   Patient presented as a direct admission from cancer center for further evaluation.  He was found to have pulmonary embolism on CT scan 06/30/2105 in addition to sizeable pericardial effusion. Which was confirmed on 2 D ECHO. Therapy with Lovenox was initiated (now on heparin drip for possible procedures). He also had reports of dysphagia and esophagogram 07/01/2015 showed a stricture at the pylorus. GI has seen the pt in consultation and plans for EGD if CTS ok with that.  Plan to transfer to [place] and cardiology were both consulted.   Assessment/Plan:    Principal Problem: Pericardial effusion - Large pericardial effusion seen on CT scan and 2 D ECHO - Cardio and CTS consulted  - Plan to transfer to [facility]  Active Problems: Acute pulmonary embolism ([place]) /  DVT (deep venous thrombosis) (HCC) - CT angio chest demonstrated focal pulmonary emboli in the proximal lingular pulmonary artery braches - Pt on IV heparin drip  Bilateral pleural effusion - No  respiratory distress - Pleur-x catheter placed on the right side back in 05/24/2015 - Will continue to monitor - CTS on board   Carcinoma of distal third of esophagus ([place]) / Dysphagia / Dehydration - Management per oncology, appreciate Dr. Benay Spice recommendations   Hyperkalemia - Unclear etiology, ? Elevated CBG's or heparin - Spontaneously resolved   Hyponatremia - Due to dehydration - Continue low rate IV fluids   Anemia of chronic disease - Secondary to malignancy and sequela of chemotherapy - Hemoglobin stable - NO reports of bleeding   Protein-calorie malnutrition, severe (HCC) - In the context of chronic illness, malignancy and dysphagia - Nutrition consulted   DVT Prophylaxis  - On IV heparin drip    Code Status: Full.  Family Communication:  plan of care discussed with the patient Disposition Plan: transfer to [facility]   IV access:  Peripheral IV  Procedures and diagnostic studies:    Ct Angio Chest Pe W/cm &/or Wo Cm 07/01/2015 There is a focal pulmonary emboli in the proximal lingular pulmonary artery branches. No larger pulmonary emboli identified. No right heart strain. Bilateral pleural effusions, larger on the left than on the right. Areas of patchy atelectasis in both lung bases. Pleural drainage catheter present on the right. Sizable pericardial effusion present. Several small aortopulmonary window lymph nodes present. Mass in the esophagus at the level of the carina. Hepatic steatosis. Stable adrenal masses bilaterally. Critical Value/emergent results were called by telephone at the time of interpretation on 07/01/2015 at 2:42 pm to [facility], NP , who verbally acknowledged these results. Electronically Signed: By: Lowella Grip III M.D. On: 07/01/2015 14:42   Dg Esophagus 07/01/2015  Persistent high grade narrowing in the region of the gastric pylorus. The smooth narrowing is more likely a benign stricture caused by thickening related to the  previous pyloroplasty than due to a neoplastic cause. Appearance also is not suggestive of extrinsic narrowing. Electronically Signed   By: Franki Cabot M.D.   On: 07/01/2015 16:14   Medical Consultants:  Cardiology Cardiothoracic surgery   Other Consultants:  None   IAnti-Infectives:   None    Leisa Lenz, MD  [facility] Pager [phone]  Time spent in minutes: 25 minutes  If 7PM-7AM, please contact night-coverage [email or web address] Password [email or web address] 07/02/2015, 12:07 PM   LOS: 1 day    HPI/Subjective: No acute overnight events. Patient reports he feels better, no shortness of breath at rest.   Objective: Filed Vitals:   07/01/15 1801 07/01/15 2118 07/01/15 2127 07/02/15 0527  BP: 128/93  127/91 111/77  Pulse: 117 118 101 115  Temp: 98 F (36.7 C)  98.3 F (36.8 C) 97.4 F (36.3 C)  TempSrc: Oral  Oral Oral  Resp: 20     Height: 5\' 10"  (1.778 m)     Weight: 78.472 kg (173 lb)     SpO2: 98%  99% 94%    Intake/Output Summary (Last 24 hours) at 07/02/15 1207 Last data filed at 07/01/15 2128  Gross per 24 hour  Intake    130 ml  Output      0 ml  Net    130 ml    Exam:   General:  Pt is alert, follows commands appropriately, not in acute distress  Cardiovascular: Regular rate and rhythm, S1/S2 (+)  Respiratory: diminished breath sounds, no wheezing   Abdomen: Soft, non tender, non distended, bowel sounds present  Extremities: No edema, pulses DP and PT palpable bilaterally  Neuro: Grossly nonfocal  Data Reviewed: Basic Metabolic Panel:  Recent Labs Lab 07/01/15 1028 07/02/15 0336  NA 130* 130*  K 5.2* 5.1  CL  --  97*  CO2 24 24  GLUCOSE 164* 146*  BUN 18.4 22*  CREATININE 1.0 1.04  CALCIUM 9.8 8.8*   Liver Function Tests:  Recent Labs Lab 07/01/15 1028  AST 29  ALT 22  ALKPHOS 130  BILITOT 0.51  PROT 6.4  ALBUMIN 2.4*   No results for input(s): LIPASE, AMYLASE in the last 168 hours. No results for input(s): AMMONIA in the  last 168 hours. CBC:  Recent Labs Lab 07/01/15 1028 07/02/15 0336  WBC 7.3 7.1  NEUTROABS 6.5  --   HGB 10.8* 10.2*  HCT 32.2* 30.9*  MCV 89.3 90.1  PLT 503* 424*   Cardiac Enzymes: No results for input(s): CKTOTAL, CKMB, CKMBINDEX, TROPONINI in the last 168 hours. BNP: Invalid input(s): POCBNP CBG:  Recent Labs Lab 07/01/15 2111 07/02/15 0739 07/02/15 1133  GLUCAP 124* 134* 148*    Recent Results (from the past 240 hour(s))  MRSA PCR Screening     Status: None   Collection Time: 07/02/15  6:13 AM  Result Value Ref Range Status   MRSA by PCR NEGATIVE NEGATIVE Final    Comment:        The GeneXpert MRSA Assay (FDA approved for NASAL specimens only), is one component of a comprehensive MRSA colonization surveillance program. It is not intended to diagnose MRSA infection nor to guide or monitor treatment for MRSA infections.      Scheduled Meds: . insulin aspart  0-9 Units Subcutaneous TID WC  . predniSONE  10 mg Oral Q breakfast  .  sodium chloride  3 mL Intravenous Q12H  . sodium chloride  3 mL Intravenous Q12H   Continuous Infusions:

## 2015-07-02 NOTE — Progress Notes (Signed)
Report called to Montine Circle, RN on [facility].

## 2015-07-02 NOTE — Consult Note (Addendum)
Referring Provider: Dr. Benay Spice  Primary Care Physician:  [name], MD Primary Gastroenterologist:  Dr. Deatra Ina in past  Reason for Consultation:  Dysphagia; abnormal imaging; history of esophageal cancer  HPI: Joel Miller is a 59 y.o. 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 Currently on Taxol and Herceptin.  Diagnosed in 05/2014 and had an esophagectomy and ongoing chemo with last dose on 10/17. Patient had a left Pleurx catheter placed several weeks ago and initially would have approximately 400 mL out in the 24 hour period, over the last several days he's only had 20 mL out over a 24-hour period. Patient presented to the [facility] yesterday for treatment of an ongoing worsening dysphagia since last Thursday or Friday.  Says that he had been able to eat whatever he wanted in small quantities but over the past month his appetite has been poor, forcing himself to eat.  Then had rather abrupt worsening of swallowing as stated above.  Patient only able to take small bites of banana and takes him approximately 1 hour to eat an entirebanana.  States that food feels like it gets stuck.   Of note patient also with several day history of worsening lower extremity edema and was found to have a pulmonary embolus.  Was on Lovenox with plans to switch to heparin gtt.  CT angio of the chest showed the following:  IMPRESSION: There is a focal pulmonary emboli in the proximal lingular pulmonary artery branches. No larger pulmonary emboli identified. No right heart strain.  Bilateral pleural effusions, larger on the left than on the right. Areas of patchy atelectasis in both lung bases. Pleural drainage catheter present on the right.  Sizable pericardial effusion present.  Several small aortopulmonary window lymph nodes present.   Mass in the esophagus at the level of the carina.  Hepatic  steatosis.  Stable adrenal masses bilaterally.  The esophagram was performed and showed the following:  IMPRESSION: Persistent high grade narrowing in the region of the gastric pylorus. The smooth narrowing is more likely a benign stricture caused by thickening related to the previous pyloroplasty than due to a neoplastic cause. Appearance also is not suggestive of extrinsic narrowing.  Patient is very anxious about having EGD performed and about getting things rolling and making a plan".   Past Medical History  Diagnosis Date  . Hyperlipidemia   . Obesity     lost 80 lbs  . Hypogonadism male   . Vitamin D deficiency   . Esophageal cancer ([place]) 05/18/14    Distal  . Allergy     HEY FEVER  . GERD (gastroesophageal reflux disease)     pt feels it was related to cancer  . S/P radiation therapy 06/11/2014-07/18/2014  . Hypertension     no med in 30months due to weight loss  . Diabetes mellitus without complication ([place])     type II no med 60 days   . Pleural effusion     Past Surgical History  Procedure Laterality Date  . Colonoscopy    . 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 Complete esophagectomy N/A 09/05/2014    Procedure: TRANSHIATAL TOTAL ESOPHAGECTOMY; Cervical esophagogastrostomy;  Surgeon: [name], MD;  Location: [place];  Service: Thoracic;  Laterality: N/A;  . Video bronchoscopy N/A 09/05/2014    Procedure: VIDEO BRONCHOSCOPY;  Surgeon: [name], MD;  Location: [place];  Service: Thoracic;  Laterality:  N/A;  . Jejunostomy N/A 09/05/2014    Procedure: FEEDING JEJUNOSTOMY;  Surgeon: [name], MD;  Location: [place];  Service: Thoracic;  Laterality: N/A;  . Pyloroplasty N/A 09/05/2014    Procedure: PYLOROPLASTY;  Surgeon: [name], MD;  Location: [place];  Service: Thoracic;  Laterality: N/A;  . Wisdom tooth extraction      2010  . Chest tube insertion Right 05/24/2015    Procedure: INSERTION PLEURAL DRAINAGE CATHETER;  Surgeon: [name], MD;  Location: [place];  Service: Thoracic;  Laterality: Right;    Prior to Admission medications   Medication Sig Start Date End Date Taking? Authorizing Provider  benzonatate (TESSALON) 200 MG capsule Take 200 mg by mouth every 4 (four) hours as needed. 06/28/15  Yes Historical Provider, MD  Naproxen Sodium 220 MG CAPS Take 1 capsule by mouth daily as needed (pain).   Yes Historical Provider, MD  oxazepam (SERAX) 15 MG capsule TAKE 1 TO 2 CAPSULES BY MOUTH 1 HOUR PRIOR TO BEDTIME FOR SLEEP IF NEEDED. *STOP TAKING ZOLPIDEM* 06/03/15  Yes Courtney Forcucci, PA-C  oxyCODONE-acetaminophen (PERCOCET/ROXICET) 5-325 MG tablet Take 1 tablet by mouth every 4 (four) hours as needed for severe pain. Patient taking differently: Take 0.5 tablets by mouth every 4 (four) hours as needed for moderate pain or severe pain.  06/17/15  Yes Owens Shark, NP  prochlorperazine (COMPAZINE) 10 MG tablet Take 1 tablet (10 mg total) by mouth every 6 (six) hours as needed for nausea. 05/27/15  Yes [name], MD  aspirin 81 MG tablet Take 81 mg by mouth daily.    Historical Provider, MD  Cholecalciferol (VITAMIN D PO) Take 10,000 Units by mouth daily.    Historical Provider, MD  mirtazapine (REMERON SOL-TAB) 15 MG disintegrating tablet Take 1 tablet (15 mg total) by mouth at bedtime. Patient not taking: Reported on 07/01/2015 05/27/15   [name], MD  Multiple Vitamin (MULTIVITAMIN) capsule Take 1 capsule by mouth daily.    Historical Provider, MD  predniSONE (DELTASONE) 10 MG tablet Take 1 tablet (10 mg total) by mouth daily with breakfast. 05/27/15   [name], MD    Current Facility-Administered Medications  Medication Dose Route Frequency Provider Last Rate Last Dose  . 0.9 %  sodium chloride infusion  250 mL Intravenous PRN [name], MD      . benzonatate (TESSALON) capsule 200 mg  200 mg Oral Q8H PRN Waldemar Dickens, MD   200 mg at 07/01/15 2134  . guaiFENesin (MUCINEX) 12 hr tablet  1,200 mg  1,200 mg Oral BID PRN Waldemar Dickens, MD      . insulin aspart (novoLOG) injection 0-9 Units  0-9 Units Subcutaneous TID WC Waldemar Dickens, MD      . LORazepam (ATIVAN) tablet 0.5 mg  0.5 mg Sublingual Q6H PRN [name], MD      . ondansetron [facility]) tablet 4 mg  4 mg Oral Q6H PRN Waldemar Dickens, MD       Or  . ondansetron [facility]) injection 4 mg  4 mg Intravenous Q6H PRN Waldemar Dickens, MD      . oxyCODONE (ROXICODONE INTENSOL) 20 MG/ML concentrated solution 5 mg  5 mg Oral Q4H PRN [name], MD      . predniSONE (DELTASONE) tablet 10 mg  10 mg Oral Q breakfast Waldemar Dickens, MD      . sodium chloride 0.9 % injection 3  mL  3 mL Intravenous Q12H Waldemar Dickens, MD   0 mL at 07/01/15 2130  . sodium chloride 0.9 % injection 3 mL  3 mL Intravenous Q12H Waldemar Dickens, MD   3 mL at 07/01/15 2128  . sodium chloride 0.9 % injection 3 mL  3 mL Intravenous PRN Waldemar Dickens, MD        Allergies as of 07/01/2015 - Review Complete 07/01/2015  Allergen Reaction Noted  . Ace inhibitors  08/10/2013    Family History  Problem Relation Age of Onset  . Hypertension Mother   . Alzheimer's disease Mother   . Cancer Father 69    throat  . Prostate cancer Brother   . Colon cancer Neg Hx   . Colon polyps Neg Hx     Social History   Social History  . Marital Status: Married    Spouse Name: N/A  . Number of Children: 3  . Years of Education: N/A   Occupational History  . Sales Development worker, community    Social History Main Topics  . Smoking status: Former Smoker -- 0.25 packs/day for 3 years    Types: Cigars  . Smokeless tobacco: Never Used     Comment: no cigars for one year  . Alcohol Use: 1.2 oz/week    2 Standard drinks or equivalent per week     Comment: social  . Drug Use: No  . Sexual Activity: Not on file   Other Topics Concern  . Not on file   Social History Narrative   Married, wife Joel Miller    Review of Systems: Ten point ROS is O/W negative  except as mentioned in HPI.  Physical Exam: Vital signs in last 24 hours: Temp:  [97.4 F (36.3 C)-98.3 F (36.8 C)] 97.4 F (36.3 C) (10/25 0527) Pulse Rate:  [101-130] 115 (10/25 0527) Resp:  [20-24] 20 (10/24 1801) BP: (111-128)/(77-102) 111/77 mmHg (10/25 0527) SpO2:  [94 %-99 %] 94 % (10/25 0527) Weight:  [173 lb (78.472 kg)-176 lb 3.2 oz (79.924 kg)] 173 lb (78.472 kg) (10/24 1801) Last BM Date: 07/01/15 General:  Alert, Well-developed, well-nourished, pleasant and cooperative in NAD Head:  Normocephalic and atraumatic. Eyes:  Sclera clear, no icterus.  Conjunctiva pink. Ears:  Normal auditory acuity. Mouth:  No deformity or lesions.   Lungs:  Decreased BS on the left. And right moreso.ceg  Heart:  Tachy.  No murmur noted. No rub. Mild JVD upright Abdomen:  Soft, non-distended.  BS present.  Non-tender. No sense of ascites Gatha Mayer, MD, [facility]  Rectal:  Deferred  Msk:  Symmetrical without gross deformities. Pulses:  Normal pulses noted. Extremities:  1+ pitting edema in B/L LE's Neurologic:  Alert and  oriented x4;  grossly normal neurologically. Skin:  Intact without significant lesions or rashes. Psych:  Alert and cooperative. Normal mood and affect.  Intake/Output from previous day: 10/24 0701 - 10/25 0700 In: 130 [P.O.:120; I.V.:10] Out: -   Lab Results:  Recent Labs  07/01/15 1028 07/02/15 0336  WBC 7.3 7.1  HGB 10.8* 10.2*  HCT 32.2* 30.9*  PLT 503* 424*   BMET  Recent Labs  07/01/15 1028 07/02/15 0336  NA 130* 130*  K 5.2* 5.1  CL  --  97*  CO2 24 24  GLUCOSE 164* 146*  BUN 18.4 22*  CREATININE 1.0 1.04  CALCIUM 9.8 8.8*   LFT  Recent Labs  07/01/15 1028  PROT 6.4  ALBUMIN 2.4*  AST 29  ALT 22  ALKPHOS 130  BILITOT 0.51   PT/INR  Recent Labs  07/02/15 0336  LABPROT 18.8*  INR 1.57*   Studies/Results:  Images reviewed in person with patient. Gatha Mayer, MD, [facility]  Ct Angio Chest Pe W/cm &/or Wo  Cm  07/01/2015  ADDENDUM REPORT: 07/01/2015 15:55 ADDENDUM: The left pleural effusion is larger than the right pleural effusion. The statement in the text is error; the statement in the impression is correct. Electronically Signed   By: Lowella Grip III M.D.   On: 07/01/2015 15:55  07/01/2015  CLINICAL DATA:  Shortness of Breath.  Esophageal carcinoma EXAM: CT ANGIOGRAPHY CHEST WITH CONTRAST TECHNIQUE: Multidetector CT imaging of the chest was performed using the standard protocol during bolus administration of intravenous contrast. Multiplanar CT image reconstructions and MIPs were obtained to evaluate the vascular anatomy. CONTRAST:  137mL OMNIPAQUE IOHEXOL 350 MG/ML SOLN COMPARISON:  Chest CT May 14, 2015; chest radiograph June 13, 2015 FINDINGS: There are focal pulmonary emboli in the proximal lingular pulmonary artery branches causing partial obstruction. No other focal pulmonary emboli are appreciable. No more central pulmonary embolus seen. No evidence of right heart strain. There is no thoracic aortic aneurysm or dissection. Visualized great vessels appear unremarkable. There are bilateral pleural effusions, larger on the right than on the left. There is a chest tube at the right base with loculation of fluid on the right. There is patchy atelectasis in both lower lobes, more on the right than on the left. There is distention of the upper esophagus with an abrupt narrowing of the esophagus at the level of the carina. There is soft tissue fullness in this area, likely due to mass in the region of the esophagus at and below the carina. Thyroid appears unremarkable. There are multiple mildly prominent mediastinal lymph nodes, largest measuring 2.0 x 1.7 cm in the aortopulmonary window region. There is a sizable pericardial effusion. In the visualized upper abdomen, there are stable adrenal masses bilaterally. The larger masses on the left measuring 2.4 x 1.3 cm. There is hepatic steatosis. There  is degenerative change in the thoracic spine. No well-defined sclerotic or lytic bone lesions are identified. Review of the MIP images confirms the above findings. IMPRESSION: There is a focal pulmonary emboli in the proximal lingular pulmonary artery branches. No larger pulmonary emboli identified. No right heart strain. Bilateral pleural effusions, larger on the left than on the right. Areas of patchy atelectasis in both lung bases. Pleural drainage catheter present on the right. Sizable pericardial effusion present. Several small aortopulmonary window lymph nodes present. Mass in the esophagus at the level of the carina. Hepatic steatosis. Stable adrenal masses bilaterally. Critical Value/emergent results were called by telephone at the time of interpretation on 07/01/2015 at 2:42 pm to [facility], NP , who verbally acknowledged these results. Electronically Signed: By: Lowella Grip III M.D. On: 07/01/2015 14:42   Dg Esophagus  07/01/2015  CLINICAL DATA:  History of esophageal cancer status post total esophagectomy. Additional surgical history of jejunostomy and pyloroplasty. EXAM: ESOPHOGRAM/BARIUM SWALLOW TECHNIQUE: Single contrast examination was performed using  thin barium. FLUOROSCOPY TIME:  If the device does not provide the exposure index: Fluoroscopy Time:  1 minutes 43 seconds Number of Acquired Images:  28 COMPARISON:  Chest CT from earlier same day. FINDINGS: Initial swallows show evidence of patient's esophagectomy and gastric pull-up. Contrast moved promptly through the gastric pull-up to the level of the diaphragm. No evidence of intrinsic or extrinsic wall irregularities  within the upper or mid portions of the gastric pull-up. A persistent narrowing was then identified in the region of the gastric pylorus. When contrast reached this area, patient did have coughing fits and slight regurgitation. Eventually, contrast did slowly pass from the slightly distended gastric pull-up to the  proximal small bowel. No extraluminal contrast material seen. IMPRESSION: Persistent high grade narrowing in the region of the gastric pylorus. The smooth narrowing is more likely a benign stricture caused by thickening related to the previous pyloroplasty than due to a neoplastic cause. Appearance also is not suggestive of extrinsic narrowing. Electronically Signed   By: Franki Cabot M.D.   On: 07/01/2015 16:14   IMPRESSION:  *Adenocarcinoma the esophagus/GE junction-status post an endoscopy 05/18/2014 confirming a gastric cardia/lower esophageal mass with tumor extending proximally in the esophagus to 25 cm from the incisor.  S/p esophagectomy and chemo, last treatment on 10/17.  Now with recurrent solid/liquid dysphasia-esophagram 07/01/2015 confirmed a stricture near the pylorus. CT chest read as mass in the esophagus near carina.  *Pulmonary embolism confirmed on a CT 07/01/2015-Lovenox initiated, but apparently going to be switched to heparin gtt *Pericardial effusion and progressive right and new left pleural effusion on the CT 07/01/2015.  Has Pleurex catheter on right for effusion.  PLAN: -Will discuss with Dr. Carlean Purl regarding EGD with dilation vs stent, etc.  He has a lot of issues that need to be sorted out first and prioritized although he is anxious about having EGD and most concerned about that.  Looks like he is going to be transferred to [facility] so I am assuming CT surgery will see him there.  ZEHR, JESSICA D.  07/02/2015, 9:49 AM  Pager number [phone]   [facility] Attending  I have also seen and assessed the patient and agree with the advanced practitioner's assessment and plan.  Very complicated patient with dysphagia in the setting of prior surgery and chemoXRT for esophageal cancer. He has had rapidly increasing sxs of cough, dysphagia and regurgitation.   He has what appears to be a stricture at the pylorus which is now in the chest. This may be what they are calling a mass  in the esophagus on chest CT.I am not convinced that is the cause of his dysphagia.  I think the the pericardial effusion could possibly have something to do with it.  My recommendation is to deal with pericardial and pleural effusions at this time and consider endoscopy later if dysphagia does nor improve.  I think recurrent cancer likely though not proven but would agree that the radiographic stricture at pylorus looks benign and would be an odd place to see malignant stricture - though possible.  Situation further complicated by need for heparin given new PE.  I am willing to perform a diagnostic EGD if Dr. Servando Snare and anesthesia ok with that (before dealing with effusions) but I want to review films with radiology also - I am not convinced there is a mass in esophagus on CT when not seen on the Ba swallow and again think pericardial effusion compression vs artifact possible.  I spent time explaining need for careful planning here and he understands though understandably remains anxious about his situation and repeatedly asked me what the plan was and who was in charge.  Gatha Mayer, MD, [facility] [phone] (pager) 07/02/2015 1:54 PM

## 2015-07-02 NOTE — Progress Notes (Signed)
  Echocardiogram 2D Echocardiogram has been performed.  Jennette Dubin 07/02/2015, 10:06 AM

## 2015-07-03 ENCOUNTER — Inpatient Hospital Stay (HOSPITAL_COMMUNITY): Payer: BLUE CROSS/BLUE SHIELD

## 2015-07-03 ENCOUNTER — Inpatient Hospital Stay (HOSPITAL_COMMUNITY): Payer: BLUE CROSS/BLUE SHIELD | Admitting: Anesthesiology

## 2015-07-03 ENCOUNTER — Encounter (HOSPITAL_COMMUNITY)
Admission: AD | Disposition: A | Discharge status: Home Health | Payer: Self-pay | Source: Ambulatory Visit | Attending: Internal Medicine

## 2015-07-03 ENCOUNTER — Ambulatory Visit: Payer: BLUE CROSS/BLUE SHIELD | Admitting: Gastroenterology

## 2015-07-03 ENCOUNTER — Encounter (HOSPITAL_COMMUNITY): Payer: BLUE CROSS/BLUE SHIELD

## 2015-07-03 DIAGNOSIS — I480 Paroxysmal atrial fibrillation: Secondary | ICD-10-CM | POA: Diagnosis present

## 2015-07-03 HISTORY — PX: CHEST TUBE INSERTION: SHX231

## 2015-07-03 HISTORY — PX: SUBXYPHOID PERICARDIAL WINDOW: SHX5075

## 2015-07-03 LAB — GRAM STAIN

## 2015-07-03 LAB — BODY FLUID CELL COUNT WITH DIFFERENTIAL
Lymphs, Fluid: 2 %
Lymphs, Fluid: 77 %
Monocyte-Macrophage-Serous Fluid: 11 % — ABNORMAL LOW (ref 50–90)
Monocyte-Macrophage-Serous Fluid: 45 % — ABNORMAL LOW (ref 50–90)
Neutrophil Count, Fluid: 12 % (ref 0–25)
Neutrophil Count, Fluid: 53 % — ABNORMAL HIGH (ref 0–25)
Total Nucleated Cell Count, Fluid: 177 cu mm (ref 0–1000)
Total Nucleated Cell Count, Fluid: 198 cu mm (ref 0–1000)

## 2015-07-03 LAB — GLUCOSE, CAPILLARY
GLUCOSE-CAPILLARY: 132 mg/dL — AB (ref 65–99)
Glucose-Capillary: 114 mg/dL — ABNORMAL HIGH (ref 65–99)
Glucose-Capillary: 175 mg/dL — ABNORMAL HIGH (ref 65–99)
Glucose-Capillary: 182 mg/dL — ABNORMAL HIGH (ref 65–99)

## 2015-07-03 LAB — PROTEIN, BODY FLUID: Total protein, fluid: <3 g/dL

## 2015-07-03 LAB — LACTATE DEHYDROGENASE, PLEURAL OR PERITONEAL FLUID
LD, Fluid: 1329 U/L — ABNORMAL HIGH (ref 3–23)
LD, Fluid: 196 U/L — ABNORMAL HIGH (ref 3–23)

## 2015-07-03 LAB — CBC
HCT: 31.7 % — ABNORMAL LOW (ref 39.0–52.0)
Hemoglobin: 10.5 g/dL — ABNORMAL LOW (ref 13.0–17.0)
MCH: 29.7 pg (ref 26.0–34.0)
MCHC: 33.1 g/dL (ref 30.0–36.0)
MCV: 89.8 fL (ref 78.0–100.0)
PLATELETS: 501 10*3/uL — AB (ref 150–400)
RBC: 3.53 MIL/uL — ABNORMAL LOW (ref 4.22–5.81)
RDW: 16.6 % — AB (ref 11.5–15.5)
WBC: 8.7 10*3/uL (ref 4.0–10.5)

## 2015-07-03 LAB — HEPARIN LEVEL (UNFRACTIONATED)
HEPARIN UNFRACTIONATED: 0.3 [IU]/mL (ref 0.30–0.70)
Heparin Unfractionated: 0.18 IU/mL — ABNORMAL LOW (ref 0.30–0.70)

## 2015-07-03 LAB — GLUCOSE, SEROUS FLUID: Glucose, Fluid: 109 mg/dL

## 2015-07-03 SURGERY — CREATION, PERICARDIAL WINDOW, SUBXIPHOID APPROACH
Anesthesia: General | Site: Chest

## 2015-07-03 MED ORDER — OXYCODONE HCL 5 MG/5ML PO SOLN: 5.0000 mg | Freq: Once | ORAL | Status: DC | PRN

## 2015-07-03 MED ORDER — HEPARIN (PORCINE) IN NACL 100-0.45 UNIT/ML-% IJ SOLN
1600.0000 [IU]/h | INTRAMUSCULAR | Status: DC
  Administered 2015-07-03: 1600 [IU]/h via INTRAVENOUS
  Filled 2015-07-03 (×2): qty 250

## 2015-07-03 MED ORDER — ACETAMINOPHEN 500 MG PO TABS
1000.0000 mg | ORAL_TABLET | Freq: Four times a day (QID) | ORAL | Status: DC
  Filled 2015-07-03: qty 2

## 2015-07-03 MED ORDER — PHENYLEPHRINE 40 MCG/ML (10ML) SYRINGE FOR IV PUSH (FOR BLOOD PRESSURE SUPPORT)
PREFILLED_SYRINGE | INTRAVENOUS | Status: AC
  Filled 2015-07-03: qty 10

## 2015-07-03 MED ORDER — SUCCINYLCHOLINE CHLORIDE 20 MG/ML IJ SOLN
INTRAMUSCULAR | Status: AC
  Filled 2015-07-03: qty 1

## 2015-07-03 MED ORDER — DEXTROSE-NACL 5-0.9 % IV SOLN
INTRAVENOUS | Status: DC
Start: 2015-07-03 — End: 2015-07-06
  Administered 2015-07-03 – 2015-07-05 (×3): via INTRAVENOUS

## 2015-07-03 MED ORDER — DIPHENHYDRAMINE HCL 50 MG/ML IJ SOLN: 12.5000 mg | Freq: Four times a day (QID) | INTRAMUSCULAR | Status: DC | PRN

## 2015-07-03 MED ORDER — HYDROMORPHONE HCL 1 MG/ML IJ SOLN
0.2500 mg | INTRAMUSCULAR | Status: DC | PRN
  Administered 2015-07-03 (×4): 0.5 mg via INTRAVENOUS

## 2015-07-03 MED ORDER — PROPOFOL 10 MG/ML IV BOLUS
INTRAVENOUS | Status: AC
  Filled 2015-07-03: qty 20

## 2015-07-03 MED ORDER — PROPOFOL 10 MG/ML IV BOLUS
INTRAVENOUS | Status: DC | PRN
  Administered 2015-07-03: 100 mg via INTRAVENOUS

## 2015-07-03 MED ORDER — SODIUM CHLORIDE 0.9 % IJ SOLN: 9.0000 mL | INTRAMUSCULAR | Status: DC | PRN

## 2015-07-03 MED ORDER — ALBUMIN HUMAN 5 % IV SOLN
INTRAVENOUS | Status: AC
  Administered 2015-07-03: 12.5 g
  Filled 2015-07-03: qty 250

## 2015-07-03 MED ORDER — PHENYLEPHRINE HCL 10 MG/ML IJ SOLN
10.0000 mg | INTRAMUSCULAR | Status: DC | PRN
  Administered 2015-07-03: 20 ug/min via INTRAVENOUS

## 2015-07-03 MED ORDER — SENNOSIDES-DOCUSATE SODIUM 8.6-50 MG PO TABS
1.0000 | ORAL_TABLET | Freq: Every day | ORAL | Status: DC
  Administered 2015-07-03 – 2015-07-05 (×2): 1 via ORAL
  Filled 2015-07-03 (×4): qty 1

## 2015-07-03 MED ORDER — MIDAZOLAM HCL 5 MG/5ML IJ SOLN
INTRAMUSCULAR | Status: DC | PRN
  Administered 2015-07-03 (×2): 1 mg via INTRAVENOUS

## 2015-07-03 MED ORDER — ONDANSETRON HCL 4 MG/2ML IJ SOLN
INTRAMUSCULAR | Status: AC
  Filled 2015-07-03: qty 2

## 2015-07-03 MED ORDER — AMIODARONE HCL IN DEXTROSE 360-4.14 MG/200ML-% IV SOLN
30.0000 mg/h | INTRAVENOUS | Status: DC
  Administered 2015-07-03 – 2015-07-04 (×3): 30 mg/h via INTRAVENOUS
  Filled 2015-07-03 (×5): qty 200

## 2015-07-03 MED ORDER — FENTANYL 40 MCG/ML IV SOLN
INTRAVENOUS | Status: DC
Start: 2015-07-03 — End: 2015-07-06
  Administered 2015-07-04: 10 ug via INTRAVENOUS
  Administered 2015-07-04: 21.8 ug via INTRAVENOUS
  Administered 2015-07-04: 0 ug via INTRAVENOUS
  Administered 2015-07-04: 50 ug via INTRAVENOUS
  Administered 2015-07-05: 40 ug via INTRAVENOUS
  Administered 2015-07-05: 60 ug via INTRAVENOUS
  Administered 2015-07-05: 120 ug via INTRAVENOUS
  Administered 2015-07-06: 0 via INTRAVENOUS
  Administered 2015-07-06: 140 ug via INTRAVENOUS
  Administered 2015-07-06: 50 ug via INTRAVENOUS
  Filled 2015-07-03 (×2): qty 25

## 2015-07-03 MED ORDER — PHENYLEPHRINE 40 MCG/ML (10ML) SYRINGE FOR IV PUSH (FOR BLOOD PRESSURE SUPPORT)
40.0000 ug | PREFILLED_SYRINGE | Freq: Once | INTRAVENOUS | Status: AC
  Administered 2015-07-03: 40 ug via INTRAVENOUS
  Filled 2015-07-03: qty 1

## 2015-07-03 MED ORDER — DIPHENHYDRAMINE HCL 12.5 MG/5ML PO ELIX
12.5000 mg | ORAL_SOLUTION | Freq: Four times a day (QID) | ORAL | Status: DC | PRN
  Filled 2015-07-03: qty 5

## 2015-07-03 MED ORDER — ACETAMINOPHEN 160 MG/5ML PO SOLN
325.0000 mg | ORAL | Status: DC | PRN
  Administered 2015-07-04: 325 mg via ORAL
  Filled 2015-07-03: qty 20.3

## 2015-07-03 MED ORDER — MEPERIDINE HCL 25 MG/ML IJ SOLN: 6.2500 mg | INTRAMUSCULAR | Status: DC | PRN

## 2015-07-03 MED ORDER — METOPROLOL TARTRATE 1 MG/ML IV SOLN
INTRAVENOUS | Status: AC
  Administered 2015-07-03: 2.5 mg via INTRAVENOUS
  Filled 2015-07-03: qty 5

## 2015-07-03 MED ORDER — STERILE WATER FOR INJECTION IJ SOLN
INTRAMUSCULAR | Status: AC
Start: 2015-07-03 — End: 2015-07-03
  Filled 2015-07-03: qty 10

## 2015-07-03 MED ORDER — PROTAMINE SULFATE 10 MG/ML IV SOLN
INTRAVENOUS | Status: AC
  Filled 2015-07-03: qty 5

## 2015-07-03 MED ORDER — EPHEDRINE SULFATE 50 MG/ML IJ SOLN
INTRAMUSCULAR | Status: AC
  Filled 2015-07-03: qty 1

## 2015-07-03 MED ORDER — LIDOCAINE HCL (CARDIAC) 20 MG/ML IV SOLN
INTRAVENOUS | Status: AC
  Filled 2015-07-03: qty 5

## 2015-07-03 MED ORDER — ONDANSETRON HCL 4 MG/2ML IJ SOLN: 4.0000 mg | Freq: Four times a day (QID) | INTRAMUSCULAR | Status: DC | PRN

## 2015-07-03 MED ORDER — 0.9 % SODIUM CHLORIDE (POUR BTL) OPTIME
TOPICAL | Status: DC | PRN
  Administered 2015-07-03: 2000 mL

## 2015-07-03 MED ORDER — BOOST / RESOURCE BREEZE PO LIQD
1.0000 | Freq: Three times a day (TID) | ORAL | Status: DC
  Administered 2015-07-04 – 2015-07-05 (×2): 1 via ORAL

## 2015-07-03 MED ORDER — ACETAMINOPHEN 160 MG/5ML PO SOLN
1000.0000 mg | Freq: Four times a day (QID) | ORAL | Status: DC
  Administered 2015-07-03: 1000 mg via ORAL
  Filled 2015-07-03 (×4): qty 40

## 2015-07-03 MED ORDER — NEOSTIGMINE METHYLSULFATE 10 MG/10ML IV SOLN
INTRAVENOUS | Status: DC | PRN
Start: 2015-07-03 — End: 2015-07-03
  Administered 2015-07-03: 3 mg via INTRAVENOUS

## 2015-07-03 MED ORDER — NALOXONE HCL 0.4 MG/ML IJ SOLN: 0.4000 mg | INTRAMUSCULAR | Status: DC | PRN

## 2015-07-03 MED ORDER — VECURONIUM BROMIDE 10 MG IV SOLR
INTRAVENOUS | Status: AC
  Filled 2015-07-03: qty 10

## 2015-07-03 MED ORDER — FENTANYL CITRATE (PF) 250 MCG/5ML IJ SOLN
INTRAMUSCULAR | Status: AC
  Filled 2015-07-03: qty 5

## 2015-07-03 MED ORDER — GLYCOPYRROLATE 0.2 MG/ML IJ SOLN
INTRAMUSCULAR | Status: DC | PRN
  Administered 2015-07-03: 0.4 mg via INTRAVENOUS

## 2015-07-03 MED ORDER — ROCURONIUM BROMIDE 50 MG/5ML IV SOLN
INTRAVENOUS | Status: AC
  Filled 2015-07-03: qty 1

## 2015-07-03 MED ORDER — AMIODARONE HCL IN DEXTROSE 360-4.14 MG/200ML-% IV SOLN
60.0000 mg/h | INTRAVENOUS | Status: AC
  Administered 2015-07-03 (×2): 60 mg/h via INTRAVENOUS
  Filled 2015-07-03: qty 200

## 2015-07-03 MED ORDER — ESMOLOL HCL 100 MG/10ML IV SOLN
INTRAVENOUS | Status: AC
  Filled 2015-07-03: qty 20

## 2015-07-03 MED ORDER — HEPARIN (PORCINE) IN NACL 100-0.45 UNIT/ML-% IJ SOLN
1850.0000 [IU]/h | INTRAMUSCULAR | Status: DC
  Filled 2015-07-03 (×3): qty 250

## 2015-07-03 MED ORDER — OXYCODONE HCL 5 MG/5ML PO SOLN
7.5000 mg | ORAL | Status: DC | PRN
  Administered 2015-07-04: 7.5 mg via ORAL
  Filled 2015-07-03: qty 10

## 2015-07-03 MED ORDER — LACTATED RINGERS IV SOLN
INTRAVENOUS | Status: DC | PRN
  Administered 2015-07-03 (×2): via INTRAVENOUS

## 2015-07-03 MED ORDER — DEXTROSE 5 % IV SOLN
1.5000 g | Freq: Two times a day (BID) | INTRAVENOUS | Status: AC
  Administered 2015-07-03 – 2015-07-04 (×2): 1.5 g via INTRAVENOUS
  Filled 2015-07-03 (×2): qty 1.5

## 2015-07-03 MED ORDER — METOPROLOL TARTRATE 1 MG/ML IV SOLN
2.5000 mg | INTRAVENOUS | Status: DC | PRN
  Administered 2015-07-03: 2.5 mg via INTRAVENOUS

## 2015-07-03 MED ORDER — FENTANYL CITRATE (PF) 250 MCG/5ML IJ SOLN
INTRAMUSCULAR | Status: DC | PRN
  Administered 2015-07-03 (×3): 50 ug via INTRAVENOUS

## 2015-07-03 MED ORDER — OXYCODONE HCL 5 MG PO TABS: 5.0000 mg | ORAL_TABLET | Freq: Once | ORAL | Status: DC | PRN

## 2015-07-03 MED ORDER — SUGAMMADEX SODIUM 200 MG/2ML IV SOLN
INTRAVENOUS | Status: AC
Start: 2015-07-03 — End: 2015-07-03
  Filled 2015-07-03: qty 2

## 2015-07-03 MED ORDER — TRAMADOL HCL 50 MG PO TABS: 50.0000 mg | ORAL_TABLET | Freq: Four times a day (QID) | ORAL | Status: DC | PRN

## 2015-07-03 MED ORDER — SODIUM CHLORIDE 0.9 % IJ SOLN
INTRAMUSCULAR | Status: AC
  Filled 2015-07-03: qty 10

## 2015-07-03 MED ORDER — ROCURONIUM BROMIDE 100 MG/10ML IV SOLN
INTRAVENOUS | Status: DC | PRN
  Administered 2015-07-03: 30 mg via INTRAVENOUS
  Administered 2015-07-03: 20 mg via INTRAVENOUS

## 2015-07-03 MED ORDER — MIDAZOLAM HCL 2 MG/2ML IJ SOLN
INTRAMUSCULAR | Status: AC
  Filled 2015-07-03: qty 4

## 2015-07-03 MED ORDER — AMIODARONE LOAD VIA INFUSION
150.0000 mg | Freq: Once | INTRAVENOUS | Status: AC
  Administered 2015-07-03: 150 mg via INTRAVENOUS
  Filled 2015-07-03: qty 83.34

## 2015-07-03 MED ORDER — ALBUMIN HUMAN 5 % IV SOLN
12.5000 g | Freq: Once | INTRAVENOUS | Status: AC
Start: 2015-07-03 — End: 2015-07-03
  Administered 2015-07-03: 12.5 g via INTRAVENOUS

## 2015-07-03 MED ORDER — HYDROMORPHONE HCL 1 MG/ML IJ SOLN
INTRAMUSCULAR | Status: AC
  Administered 2015-07-03: 0.5 mg via INTRAVENOUS
  Filled 2015-07-03: qty 2

## 2015-07-03 MED ORDER — BISACODYL 5 MG PO TBEC
10.0000 mg | DELAYED_RELEASE_TABLET | Freq: Every day | ORAL | Status: DC
  Administered 2015-07-03 – 2015-07-04 (×2): 10 mg via ORAL
  Filled 2015-07-03 (×3): qty 2

## 2015-07-03 SURGICAL SUPPLY — 57 items
ADH SKN CLS APL DERMABOND .7 (GAUZE/BANDAGES/DRESSINGS) ×2
BLADE STERNUM SYSTEM 6 (BLADE) ×1 IMPLANT
BRUSH SCRUB EZ PLAIN DRY (MISCELLANEOUS) ×6 IMPLANT
CANISTER SUCTION 2500CC (MISCELLANEOUS) ×3 IMPLANT
CATH THORACIC 28FR (CATHETERS) IMPLANT
CATH THORACIC 28FR RT ANG (CATHETERS) IMPLANT
CLIP TI MEDIUM 24 (CLIP) ×1 IMPLANT
CLIP TI WIDE RED SMALL 24 (CLIP) ×1 IMPLANT
CONN ST 1/4X3/8  BEN (MISCELLANEOUS) ×2
CONN ST 1/4X3/8 BEN (MISCELLANEOUS) ×2 IMPLANT
CONT SPEC 4OZ CLIKSEAL STRL BL (MISCELLANEOUS) ×3 IMPLANT
COVER PROBE W GEL 5X96 (DRAPES) ×1 IMPLANT
COVER SURGICAL LIGHT HANDLE (MISCELLANEOUS) ×3 IMPLANT
COVER TRANSDUCER ULTRASND GEL (DRAPE) ×3 IMPLANT
DERMABOND ADVANCED (GAUZE/BANDAGES/DRESSINGS) ×1
DERMABOND ADVANCED .7 DNX12 (GAUZE/BANDAGES/DRESSINGS) ×2 IMPLANT
DRAIN CHANNEL 28F RND 3/8 FF (WOUND CARE) ×3 IMPLANT
DRAPE C-ARM 42X72 X-RAY (DRAPES) ×3 IMPLANT
DRAPE LAPAROSCOPIC ABDOMINAL (DRAPES) ×3 IMPLANT
ELECT BLADE 4.0 EZ CLEAN MEGAD (MISCELLANEOUS) ×3
ELECT REM PT RETURN 9FT ADLT (ELECTROSURGICAL) ×3
ELECTRODE BLDE 4.0 EZ CLN MEGD (MISCELLANEOUS) IMPLANT
ELECTRODE REM PT RTRN 9FT ADLT (ELECTROSURGICAL) ×2 IMPLANT
GAUZE SPONGE 4X4 12PLY STRL (GAUZE/BANDAGES/DRESSINGS) ×1 IMPLANT
GLOVE BIO SURGEON STRL SZ 6.5 (GLOVE) ×6 IMPLANT
GOWN STRL REUS W/ TWL LRG LVL3 (GOWN DISPOSABLE) ×4 IMPLANT
GOWN STRL REUS W/TWL LRG LVL3 (GOWN DISPOSABLE) ×6
HEMOSTAT POWDER SURGIFOAM 1G (HEMOSTASIS) IMPLANT
KIT BASIN OR (CUSTOM PROCEDURE TRAY) ×3 IMPLANT
KIT PLEURX DRAIN CATH 1000ML (MISCELLANEOUS) ×3 IMPLANT
KIT PLEURX DRAIN CATH 15.5FR (DRAIN) ×3 IMPLANT
KIT ROOM TURNOVER OR (KITS) ×3 IMPLANT
NS IRRIG 1000ML POUR BTL (IV SOLUTION) ×6 IMPLANT
PACK CHEST (CUSTOM PROCEDURE TRAY) ×3 IMPLANT
PACK GENERAL/GYN (CUSTOM PROCEDURE TRAY) ×3 IMPLANT
PAD ARMBOARD 7.5X6 YLW CONV (MISCELLANEOUS) ×6 IMPLANT
PAD ELECT DEFIB RADIOL ZOLL (MISCELLANEOUS) ×3 IMPLANT
SET DRAINAGE LINE (MISCELLANEOUS) IMPLANT
SUT ETHILON 3 0 FSL (SUTURE) ×3 IMPLANT
SUT SILK  1 MH (SUTURE) ×1
SUT SILK 1 MH (SUTURE) IMPLANT
SUT SILK 1 TIES 10X30 (SUTURE) ×1 IMPLANT
SUT VIC AB 1 CTX 18 (SUTURE) ×3 IMPLANT
SUT VIC AB 2-0 CTX 27 (SUTURE) ×4 IMPLANT
SUT VIC AB 3-0 X1 27 (SUTURE) ×4 IMPLANT
SWAB COLLECTION DEVICE MRSA (MISCELLANEOUS) IMPLANT
SYR 50ML SLIP (SYRINGE) IMPLANT
SYRINGE 10CC LL (SYRINGE) IMPLANT
SYSTEM SAHARA CHEST DRAIN ATS (WOUND CARE) ×3 IMPLANT
TAPE CLOTH SURG 4X10 WHT LF (GAUZE/BANDAGES/DRESSINGS) ×1 IMPLANT
TOWEL OR 17X24 6PK STRL BLUE (TOWEL DISPOSABLE) ×3 IMPLANT
TOWEL OR 17X26 10 PK STRL BLUE (TOWEL DISPOSABLE) ×3 IMPLANT
TRAP SPECIMEN MUCOUS 40CC (MISCELLANEOUS) ×9 IMPLANT
TRAY FOLEY CATH 16FRSI W/METER (SET/KITS/TRAYS/PACK) ×3 IMPLANT
TUBE ANAEROBIC SPECIMEN COL (MISCELLANEOUS) IMPLANT
VALVE REPLACEMENT CAP (MISCELLANEOUS) IMPLANT
WATER STERILE IRR 1000ML POUR (IV SOLUTION) ×6 IMPLANT

## 2015-07-03 NOTE — Progress Notes (Signed)
Per pharmacy, Heparin changed to 16.

## 2015-07-03 NOTE — Anesthesia Postprocedure Evaluation (Signed)
  Anesthesia Post-op Note  Patient: Joel Miller  Procedure(s) Performed: Procedure(s): SUBXYPHOID PERICARDIAL WINDOW (N/A) INSERTION PLEURAL DRAINAGE CATHETER (Left)  Patient Location: PACU  Anesthesia Type:General  Level of Consciousness: awake, alert  and oriented  Airway and Oxygen Therapy: Patient Spontanous Breathing  Post-op Pain: mild  Post-op Assessment: Post-op Vital signs reviewed, No signs of Nausea or vomiting and Pain level controlled              Post-op Vital Signs: Reviewed  Last Vitals:  Filed Vitals:   07/03/15 1315  BP: 103/73  Pulse: 132  Temp:   Resp: 18    Complications: No anesthesia complications present, pt noted to be Afib w/ RVR (confirmed w/ EKG), surgeon and cardiology notified. Treatment initiated.

## 2015-07-03 NOTE — Progress Notes (Signed)
UR COMPLETED  

## 2015-07-03 NOTE — Transfer of Care (Signed)
Immediate Anesthesia Transfer of Care Note  Patient: Joel Miller  Procedure(s) Performed: Procedure(s): SUBXYPHOID PERICARDIAL WINDOW (N/A) INSERTION PLEURAL DRAINAGE CATHETER (Left)  Patient Location: PACU  Anesthesia Type:General  Level of Consciousness: awake, alert , oriented and patient cooperative  Airway & Oxygen Therapy: Patient Spontanous Breathing and Patient connected to nasal cannula oxygen  Post-op Assessment: Report given to RN, Post -op Vital signs reviewed and stable and Patient moving all extremities  Post vital signs: Reviewed and stable  Last Vitals:  Filed Vitals:   07/03/15 0645  BP: 125/104  Pulse: 111  Temp: 36.4 C  Resp: 20    Complications: No apparent anesthesia complications

## 2015-07-03 NOTE — Progress Notes (Signed)
Dr. Servando Snare made aware of pts new onset a fib per EKG. Will call cardiology per request,

## 2015-07-03 NOTE — Anesthesia Preprocedure Evaluation (Addendum)
Anesthesia Evaluation  Patient identified by MRN, date of birth, ID band Patient awake    Reviewed: Allergy & Precautions, NPO status , Patient's Chart, lab work & pertinent test results  Airway Mallampati: I  TM Distance: >3 FB Neck ROM: Full    Dental  (+) Teeth Intact, Dental Advisory Given   Pulmonary former smoker,    breath sounds clear to auscultation       Cardiovascular hypertension, Pt. on medications  Rhythm:Regular Rate:Normal     Neuro/Psych negative neurological ROS  negative psych ROS   GI/Hepatic negative GI ROS, Neg liver ROS, GERD  Medicated and Controlled,  Endo/Other  diabetes, Well Controlled, Type 2  Renal/GU negative Renal ROS  negative genitourinary   Musculoskeletal negative musculoskeletal ROS (+)   Abdominal   Peds negative pediatric ROS (+)  Hematology negative hematology ROS (+)   Anesthesia Other Findings Acute PE HLD S/p Esophagectomy  Reproductive/Obstetrics negative OB ROS                           Lab Results  Component Value Date   WBC 8.7 07/03/2015   HGB 10.5* 07/03/2015   HCT 31.7* 07/03/2015   MCV 89.8 07/03/2015   PLT 501* 07/03/2015   Lab Results  Component Value Date   CREATININE 1.24 07/02/2015   BUN 25* 07/02/2015   NA 128* 07/02/2015   K 5.6* 07/02/2015   CL 95* 07/02/2015   CO2 22 07/02/2015   Lab Results  Component Value Date   INR 1.62* 07/02/2015   INR 1.57* 07/02/2015   INR 1.30 05/24/2015   EKG: NSR.   Anesthesia Physical Anesthesia Plan  ASA: III  Anesthesia Plan: General   Post-op Pain Management:    Induction: Intravenous  Airway Management Planned: Oral ETT  Additional Equipment: CVP and Ultrasound Guidance Line Placement  Intra-op Plan: Utilization Of Total Body Hypothermia per surgeon request  Post-operative Plan: Extubation in OR and Possible Post-op intubation/ventilation  Informed Consent: I  have reviewed the patients History and Physical, chart, labs and discussed the procedure including the risks, benefits and alternatives for the proposed anesthesia with the patient or authorized representative who has indicated his/her understanding and acceptance.   Dental advisory given  Plan Discussed with: CRNA, Anesthesiologist and Surgeon  Anesthesia Plan Comments:       Anesthesia Quick Evaluation

## 2015-07-03 NOTE — Care Management Note (Addendum)
Case Management Note  Patient Details  Name: Joel Miller MRN: [id number] Date of Birth: [birth year]  Subjective/Objective:                 Admitted with worsening SOB/PE, bilateral pleural effusion from home with wife. 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 S/P SUBXYPHOID PERICARDIAL WINDOW AND INSERTION OF PLEURAL DRAINAGE CATHETER  10/ 26/2016.   Action/Plan: Return to home when medically stable. CM to f/u with d/c disposition.  Expected Discharge Date:   (unknown)               Expected Discharge Plan:  Home/Self Care  In-House Referral:     Discharge planning Services  CM Consult  Post Acute Care Choice:    Choice offered to:     DME Arranged:    DME Agency:     HH Arranged:    HH Agency:     Status of Service:  In process, will continue to follow  Medicare Important Message Given:    Date Medicare IM Given:    Medicare IM give by:    Date Additional Medicare IM Given:    Additional Medicare Important Message give by:     If discussed at [place] of Stay Meetings, dates discussed:    Additional Comments:  Wife to care for pleurx drain @ d/c. Joel Miller (Spouse) [phone]  Joel Miller, [place] [phone] 07/03/2015, 7:55 PM

## 2015-07-03 NOTE — Progress Notes (Signed)
ANTICOAGULATION CONSULT NOTE - FOLLOW UP    HL = 0.18 (goal 0.3 - 0.7 units/mL) Heparin dosing weight = 78 kg   Assessment: 29 YOM with new PE and DVT to continue on IV heparin.  Heparin level is sub-therapeutic.  No complication with infusion nor bleeding per RN.   Plan: - Increase heparin gtt to 1850 units/hr, no bolus - Check 6 hr HL - Monitor closely for s/sx of bleeding   Jaquil Todt D. Mina Marble, PharmD, BCPS 07/03/2015, 10:02 PM

## 2015-07-03 NOTE — Progress Notes (Signed)
  Amiodarone Drug - Drug Interaction Consult Note  Recommendations: Currently not on any medications that would interact with amiodarone. Will continue to follow and monitor for drug interactions with amiodarone.  Amiodarone is metabolized by the cytochrome P450 system and therefore has the potential to cause many drug interactions. Amiodarone has an average plasma half-life of 50 days (range 20 to 100 days).   There is potential for drug interactions to occur several weeks or months after stopping treatment and the onset of drug interactions may be slow after initiating amiodarone.   []  Statins: Increased risk of myopathy. Simvastatin- restrict dose to 20mg  daily. Other statins: counsel patients to report any muscle pain or weakness immediately.  []  Anticoagulants: Amiodarone can increase anticoagulant effect. Consider warfarin dose reduction. Patients should be monitored closely and the dose of anticoagulant altered accordingly, remembering that amiodarone levels take several weeks to stabilize.  []  Antiepileptics: Amiodarone can increase plasma concentration of phenytoin, the dose should be reduced. Note that small changes in phenytoin dose can result in large changes in levels. Monitor patient and counsel on signs of toxicity.  []  Beta blockers: increased risk of bradycardia, AV block and myocardial depression. Sotalol - avoid concomitant use.  []   Calcium channel blockers (diltiazem and verapamil): increased risk of bradycardia, AV block and myocardial depression.  []   Cyclosporine: Amiodarone increases levels of cyclosporine. Reduced dose of cyclosporine is recommended.  []  Digoxin dose should be halved when amiodarone is started.  []  Diuretics: increased risk of cardiotoxicity if hypokalemia occurs.  []  Oral hypoglycemic agents (glyburide, glipizide, glimepiride): increased risk of hypoglycemia. Patient's glucose levels should be monitored closely when initiating amiodarone therapy.    []  Drugs that prolong the QT interval:  Torsades de pointes risk may be increased with concurrent use - avoid if possible.  Monitor QTc, also keep magnesium/potassium WNL if concurrent therapy can't be avoided. Marland Kitchen Antibiotics: e.g. fluoroquinolones, erythromycin. . Antiarrhythmics: e.g. quinidine, procainamide, disopyramide, sotalol. . Antipsychotics: e.g. phenothiazines, haloperidol.  . Lithium, tricyclic antidepressants, and methadone.  Thank Joel Miller  07/03/2015 2:09 PM

## 2015-07-03 NOTE — Progress Notes (Signed)
Patient ID: Joel Miller, male   DOB: 07/30/1956, 59 y.o.   MRN: [id number]    Subjective:  Patient seen in PACU post op  Developed rapid atrial fibrillation Just sore from surgery   Objective:  Filed Vitals:   07/03/15 1145 07/03/15 1200 07/03/15 1215 07/03/15 1230  BP: 106/81 109/81 98/71 97/81   Pulse: 126 137 134 60  Temp: 98.6 F (37 C)     TempSrc:      Resp: 11 14 11 12   Height:      Weight:      SpO2: 94% 91% 92% 93%    Intake/Output from previous day:  Intake/Output Summary (Last 24 hours) at 07/03/15 1316 Last data filed at 07/03/15 1230  Gross per 24 hour  Intake   1100 ml  Output    665 ml  Net    435 ml    Physical Exam: Affect appropriate Chronically ill white male  HEENT: normal Neck supple with no adenopathy right IJ  JVP normal no bruits no thyromegaly Lungs decreased BS bases no wheezing and good diaphragmatic motion Bilateral Pleur X catheters left new  Heart:  S1/S2 no murmur, no rub, gallop or click Subxiphoid drain in from window  PMI normal Abdomen: benighn, BS positve, no tenderness, no AAA no bruit.  No HSM or HJR Distal pulses intact with no bruits No edema Neuro non-focal Skin warm and dry No muscular weakness   Lab Results: Basic Metabolic Panel:  Recent Labs  07/02/15 0336 07/02/15 2050  NA 130* 128*  K 5.1 5.6*  CL 97* 95*  CO2 24 22  GLUCOSE 146* 130*  BUN 22* 25*  CREATININE 1.04 1.24  CALCIUM 8.8* 8.9   Liver Function Tests:  Recent Labs  07/01/15 1028 07/02/15 1630 07/02/15 2050  AST 29  --  167*  ALT 22  --  152*  ALKPHOS 130  --  107  BILITOT 0.51  --  0.8  PROT 6.4 5.9* 5.8*  ALBUMIN 2.4* 2.3* 2.2*   CBC:  Recent Labs  07/01/15 1028  07/02/15 2050 07/03/15 0420  WBC 7.3  < > 7.9 8.7  NEUTROABS 6.5  --   --   --   HGB 10.8*  < > 10.2* 10.5*  HCT 32.2*  < > 31.3* 31.7*  MCV 89.3  < > 89.2 89.8  PLT 503*  < > 423* 501*  < > = values in this interval not displayed.  Imaging: Ct Angio  Chest Pe W/cm &/or Wo Cm  07/01/2015  ADDENDUM REPORT: 07/01/2015 15:55 ADDENDUM: The left pleural effusion is larger than the right pleural effusion. The statement in the text is error; the statement in the impression is correct. Electronically Signed   By: Lowella Grip III M.D.   On: 07/01/2015 15:55  07/01/2015  CLINICAL DATA:  Shortness of Breath.  Esophageal carcinoma EXAM: CT ANGIOGRAPHY CHEST WITH CONTRAST TECHNIQUE: Multidetector CT imaging of the chest was performed using the standard protocol during bolus administration of intravenous contrast. Multiplanar CT image reconstructions and MIPs were obtained to evaluate the vascular anatomy. CONTRAST:  147mL OMNIPAQUE IOHEXOL 350 MG/ML SOLN COMPARISON:  Chest CT May 14, 2015; chest radiograph June 13, 2015 FINDINGS: There are focal pulmonary emboli in the proximal lingular pulmonary artery branches causing partial obstruction. No other focal pulmonary emboli are appreciable. No more central pulmonary embolus seen. No evidence of right heart strain. There is no thoracic aortic aneurysm or dissection. Visualized great vessels appear unremarkable. There are  bilateral pleural effusions, larger on the right than on the left. There is a chest tube at the right base with loculation of fluid on the right. There is patchy atelectasis in both lower lobes, more on the right than on the left. There is distention of the upper esophagus with an abrupt narrowing of the esophagus at the level of the carina. There is soft tissue fullness in this area, likely due to mass in the region of the esophagus at and below the carina. Thyroid appears unremarkable. There are multiple mildly prominent mediastinal lymph nodes, largest measuring 2.0 x 1.7 cm in the aortopulmonary window region. There is a sizable pericardial effusion. In the visualized upper abdomen, there are stable adrenal masses bilaterally. The larger masses on the left measuring 2.4 x 1.3 cm. There is  hepatic steatosis. There is degenerative change in the thoracic spine. No well-defined sclerotic or lytic bone lesions are identified. Review of the MIP images confirms the above findings. IMPRESSION: There is a focal pulmonary emboli in the proximal lingular pulmonary artery branches. No larger pulmonary emboli identified. No right heart strain. Bilateral pleural effusions, larger on the left than on the right. Areas of patchy atelectasis in both lung bases. Pleural drainage catheter present on the right. Sizable pericardial effusion present. Several small aortopulmonary window lymph nodes present. Mass in the esophagus at the level of the carina. Hepatic steatosis. Stable adrenal masses bilaterally. Critical Value/emergent results were called by telephone at the time of interpretation on 07/01/2015 at 2:42 pm to [facility], NP , who verbally acknowledged these results. Electronically Signed: By: Lowella Grip III M.D. On: 07/01/2015 14:42   Dg Esophagus  07/01/2015  CLINICAL DATA:  History of esophageal cancer status post total esophagectomy. Additional surgical history of jejunostomy and pyloroplasty. EXAM: ESOPHOGRAM/BARIUM SWALLOW TECHNIQUE: Single contrast examination was performed using  thin barium. FLUOROSCOPY TIME:  If the device does not provide the exposure index: Fluoroscopy Time:  1 minutes 43 seconds Number of Acquired Images:  28 COMPARISON:  Chest CT from earlier same day. FINDINGS: Initial swallows show evidence of patient's esophagectomy and gastric pull-up. Contrast moved promptly through the gastric pull-up to the level of the diaphragm. No evidence of intrinsic or extrinsic wall irregularities within the upper or mid portions of the gastric pull-up. A persistent narrowing was then identified in the region of the gastric pylorus. When contrast reached this area, patient did have coughing fits and slight regurgitation. Eventually, contrast did slowly pass from the slightly distended  gastric pull-up to the proximal small bowel. No extraluminal contrast material seen. IMPRESSION: Persistent high grade narrowing in the region of the gastric pylorus. The smooth narrowing is more likely a benign stricture caused by thickening related to the previous pyloroplasty than due to a neoplastic cause. Appearance also is not suggestive of extrinsic narrowing. Electronically Signed   By: Franki Cabot M.D.   On: 07/01/2015 16:14   Dg Chest Port 1 View  07/03/2015  CLINICAL DATA:  Status post pericardial window/strain on the right. PleurX catheter placed on the left. Bilateral pleural effusions. EXAM: PORTABLE CHEST 1 VIEW COMPARISON:  06/13/2015 FINDINGS: Stable chest tube curls in the right lower hemi thorax. There is a persistent moderate right pleural effusion obscuring the hemidiaphragm. New left sided chest tube projects at the left lung base. Left pleural fluid appears decreased from the prior study, now small in amount. No pneumothorax. Right lower lung zone opacity is similar to the priors exam allowing for differences in patient  positioning and technique. This is likely atelectasis and/or scarring. No new lung opacities. Right internal jugular central venous line is new from the prior study. Tip projects at the caval atrial junction. IMPRESSION: 1. Decreased left pleural fluid following left PleurX catheter placement. 2. Persistent right pleural fluid. No change in the right lower hemi thorax chest tube. 3. No pneumothorax. 4. New right internal jugular central venous line is well positioned with its tip at the caval atrial junction. Electronically Signed   By: Lajean Manes M.D.   On: 07/03/2015 11:14   Dg Fluoro Guide Cv Line-no Report  07/03/2015  CLINICAL DATA:  FLOURO GUIDE CV LINE Fluoroscopy was utilized by the requesting physician.  No radiographic interpretation.    Cardiac Studies:  ECG:  Rapid afib no ST/T wave changes    Telemetry: afib rates 120  Echo:  Large pericardial  effusion normal EF   Medications:   . [MAR Hold] insulin aspart  0-9 Units Subcutaneous TID WC  . [MAR Hold] predniSONE  10 mg Oral Q breakfast  . [MAR Hold] sodium chloride  3 mL Intravenous Q12H  . [MAR Hold] sodium chloride  3 mL Intravenous Q12H     . sodium chloride 50 mL/hr at 07/02/15 1700  . heparin 1,600 Units/hr (07/03/15 0540)    Assessment/Plan:  PAF:  Start amiodarone  BP soft for AV nodal blocking drugs  Resume heparin when ok with CVTS ? When pericardial drain out.  Recent DVT Requires anticoagulation as well  Pericardial Effusion: likely malignant post window not much drainage  Pleural Effusion: old right Pleur X cytology not diagnostic  New left pleur X  Esophageal CA:  Prognosis appears poor   Jenkins Rouge 07/03/2015, 1:16 PM

## 2015-07-03 NOTE — Progress Notes (Signed)
[facility] DAILY ICU PROGRESS NOTE                   [address]            [place] [address]          [phone]   Day of Surgery Procedure(s) (LRB): SUBXYPHOID PERICARDIAL WINDOW (N/A) INSERTION PLEURAL DRAINAGE CATHETER (Left)  Total Length of Stay:  LOS: 2 days   Subjective: Post op   Objective: Vital signs in last 24 hours: Temp:  [97.4 F (36.3 C)-98.7 F (37.1 C)] 97.4 F (36.3 C) (10/26 1401) Pulse Rate:  [52-141] 119 (10/26 1800) Cardiac Rhythm:  [-] Atrial fibrillation (10/26 1500) Resp:  [11-27] 15 (10/26 1800) BP: (78-125)/(61-104) 88/66 mmHg (10/26 1800) SpO2:  [89 %-97 %] 97 % (10/26 1800) Arterial Line BP: (69-117)/(53-87) 86/59 mmHg (10/26 1800)  Filed Weights   07/01/15 1801  Weight: 173 lb (78.472 kg)    Weight change:    Hemodynamic parameters for last 24 hours:    Intake/Output from previous day: 10/25 0701 - 10/26 0700 In: 150 [I.V.:150] Out: -   Intake/Output this shift: Total I/O In: 1433.4 [I.V.:1433.4] Out: 840 [Urine:750; Blood:20; Chest Tube:70]  Current Meds: Scheduled Meds: . albumin human  12.5 g Intravenous Once  . albumin human      . bisacodyl  10 mg Oral Daily  . cefUROXime (ZINACEF)  IV  1.5 g Intravenous Q12H  . feeding supplement  1 Container Oral TID BM  . fentaNYL   Intravenous 6 times per day  . insulin aspart  0-9 Units Subcutaneous TID WC  . predniSONE  10 mg Oral Q breakfast  . senna-docusate  1 tablet Oral QHS  . sodium chloride  3 mL Intravenous Q12H  . sodium chloride  3 mL Intravenous Q12H   Continuous Infusions: . amiodarone 60 mg/hr (07/03/15 1710)   Followed by  . amiodarone    . dextrose 5 % and 0.9% NaCl 100 mL/hr at 07/03/15 1800  . heparin 1,600 Units/hr (07/03/15 1800)   PRN Meds:.sodium chloride, oxyCODONE **AND** acetaminophen, benzonatate, diphenhydrAMINE **OR** diphenhydrAMINE, guaiFENesin, LORazepam, naloxone **AND** sodium chloride, ondansetron **OR** ondansetron (ZOFRAN)  IV, oxyCODONE, sodium chloride, traMADol    Lab Results: CBC: Recent Labs  07/02/15 2050 07/03/15 0420  WBC 7.9 8.7  HGB 10.2* 10.5*  HCT 31.3* 31.7*  PLT 423* 501*   BMET:  Recent Labs  07/02/15 0336 07/02/15 2050  NA 130* 128*  K 5.1 5.6*  CL 97* 95*  CO2 24 22  GLUCOSE 146* 130*  BUN 22* 25*  CREATININE 1.04 1.24  CALCIUM 8.8* 8.9    PT/INR:  Recent Labs  07/02/15 2050  LABPROT 19.3*  INR 1.62*   Radiology: Dg Chest Port 1 View  07/03/2015  CLINICAL DATA:  Status post pericardial window/strain on the right. PleurX catheter placed on the left. Bilateral pleural effusions. EXAM: PORTABLE CHEST 1 VIEW COMPARISON:  06/13/2015 FINDINGS: Stable chest tube curls in the right lower hemi thorax. There is a persistent moderate right pleural effusion obscuring the hemidiaphragm. New left sided chest tube projects at the left lung base. Left pleural fluid appears decreased from the prior study, now small in amount. No pneumothorax. Right lower lung zone opacity is similar to the priors exam allowing for differences in patient positioning and technique. This is likely atelectasis and/or scarring. No new lung opacities. Right internal jugular central venous line is new from the prior study. Tip projects at the  caval atrial junction. IMPRESSION: 1. Decreased left pleural fluid following left PleurX catheter placement. 2. Persistent right pleural fluid. No change in the right lower hemi thorax chest tube. 3. No pneumothorax. 4. New right internal jugular central venous line is well positioned with its tip at the caval atrial junction. Electronically Signed   By: Lajean Manes M.D.   On: 07/03/2015 11:14   Dg Fluoro Guide Cv Line-no Report  07/03/2015  CLINICAL DATA:  FLOURO GUIDE CV LINE Fluoroscopy was utilized by the requesting physician.  No radiographic interpretation.     Assessment/Plan: S/P Procedure(s) (LRB): SUBXYPHOID PERICARDIAL WINDOW (N/A) INSERTION PLEURAL DRAINAGE  CATHETER (Left) Still in afib Feels better  Good pin control and pca not hooked up yet Back on heparin     [name] 07/03/2015 6:24 PM

## 2015-07-03 NOTE — Progress Notes (Signed)
Spoke to Dr Saralyn Pilar about order for cardiac monitoring since [facility] does not do telemetry if patient needed to be moved or if order could be discontinued. MD asked this RN to ask the patient if he would be willing to wear the monitoring and he said "No" would "prefer not have to be hooked up to things". Called Dr Marily Memos back and he said it would be "fine" to leave patient where he is.

## 2015-07-03 NOTE — Progress Notes (Signed)
Instructed patient on incentive spirometry use, up to 500 with full effort, encouraged deep breathing and coughing.

## 2015-07-03 NOTE — Progress Notes (Addendum)
   Patient Name: Joel Miller Date of Encounter: 07/03/2015, 6:04 PM    Subjective  Feels ok/better - less dyspneic after pericardial window and left pleurex catheter Swallowed water and meds ok    Objective  BP 91/71 mmHg  Pulse 119  Temp(Src) 97.4 F (36.3 C) (Oral)  Resp 15  Ht 5\' 10"  (1.778 m)  Wt 173 lb (78.472 kg)  BMI 24.82 kg/m2  SpO2 97% NAD    Assessment and Plan  Pyloric stricture and dysphagia Hx esophageal cancer s/p chemo/XRT and surgery with pleural and pericardial effusions suspicious for recurrent cancer S/p pericardial window and pleurex catheter today Pulmonary embolism on heparin    Anticipate EGD and possible balloon dilation of pylorus Fri 10/28 if doing well enough - would hold heparin 2-4 hrs before Would prefer MAC  Gatha Mayer, MD, [facility] [phone] (pager) 07/03/2015 6:04 PM

## 2015-07-03 NOTE — Progress Notes (Signed)
ANTICOAGULATION CONSULT NOTE - Follow Up Consult  Pharmacy Consult for Heparin  Indication: pulmonary embolus  Allergies  Allergen Reactions  . Ace Inhibitors     cough    Patient Measurements: Height: 5\' 10"  (177.8 cm) Weight: 173 lb (78.472 kg) IBW/kg (Calculated) : 73  Vital Signs: Temp: 98.7 F (37.1 C) (10/26 1245) Temp Source: Oral (10/26 0645) BP: 85/75 mmHg (10/26 1400) Pulse Rate: 52 (10/26 1330)  Labs:  Recent Labs  07/01/15 1028  07/02/15 0336 07/02/15 1630 07/02/15 2050 07/03/15 0420  HGB  --   < > 10.2* 10.1* 10.2* 10.5*  HCT  --   < > 30.9* 30.4* 31.3* 31.7*  PLT  --   < > 424* 476* 423* 501*  APTT  --   --  49*  --  51*  --   LABPROT  --   --  18.8*  --  19.3*  --   INR  --   --  1.57*  --  1.62*  --   HEPARINUNFRC  --   --   --   --   --  0.30  CREATININE 1.0  --  1.04  --  1.24  --   < > = values in this interval not displayed.  Estimated Creatinine Clearance: 65.4 mL/min (by C-G formula based on Cr of 1.24).  Assessment: 59 yo M presents with new PE/DVT, recvd 1 dose LMWH 1.5mg /kg on 10/24 on 1726 then started on heparin bridge for EGD sometime in next few days. Last HL was 0.3 this am and rate was increased since level was borderline. Went to OR today and heparin was stopped. Called RN and heparin will be restarted around 1500 today.  Goal of Therapy:  Heparin level 0.3-0.7 units/ml Monitor platelets by anticoagulation protocol: Yes   Plan:  Restart heparin gtt at 1,600 units/hr Check 6 hr HL Monitor daily HL, CBC, s/s of bleed

## 2015-07-03 NOTE — Brief Op Note (Addendum)
      [address]       ,[place] [address]             [phone]      07/03/2015  9:57 AM  PATIENT:  Joel Miller  59 y.o. male  PRE-OPERATIVE DIAGNOSIS:  1. PERICARDIAL EFFUSION 2. LEFT PLEURAL EFFUSION 3. HISTORY OF ESOPHAGEAL CANCER 4.PE  POST-OPERATIVE DIAGNOSIS:  1. PERICARDIAL EFFUSION 2. LEFT PLEURAL EFFUSION 3. HISTORY OF ESOPHAGEAL CANCER 4.PE  PROCEDURE:  SUBXYPHOID PERICARDIAL WINDOW  INSERTION LEFT PLEURAL DRAINAGE CATHETER  With [place] and fluro guidance   FINDINGS: Approximately 500 cc of bloody like pleural fluid removed.  SURGEON:  Surgeon(s) and Role:    * [name], MD - Primary  PHYSICIAN ASSISTANT: Lars Pinks PA-C  ANESTHESIA:   general  EBL:  Total I/O In: -  Out: 225 [Urine:225]  BLOOD ADMINISTERED:none  DRAINS: 28 Blake drain   SPECIMEN:  Source of Specimen:  Pericardial fluid, pericardial biopsy and left pleural fluid. 400-500 ml dark colored pericardial fluid, 1539ml of straw colored pleural fluid  DISPOSITION OF SPECIMEN:  Pathology and cytology  COUNTS CORRECT:  YES  DICTATION: .Dragon Dictation  PLAN OF CARE: Admit to inpatient   PATIENT DISPOSITION:  PACU - hemodynamically stable.   Delay start of Pharmacological VTE agent (>24hrs) due to surgical blood loss or risk of bleeding: NO because has PE

## 2015-07-03 NOTE — Progress Notes (Signed)
Pt remains tachycardic despite pain medication administration, Dr. Smith Robert called and order received for fluid bolus, Dr. Smith Robert by to see patient, ekg ordered. Will continue to monitor.

## 2015-07-03 NOTE — Anesthesia Procedure Notes (Addendum)
Procedure Name: Intubation Date/Time: 07/03/2015 8:51 AM Performed by: Julian Reil Pre-anesthesia Checklist: Patient identified, Emergency Drugs available, Patient being monitored and Suction available Patient Re-evaluated:Patient Re-evaluated prior to inductionOxygen Delivery Method: Circle system utilized Preoxygenation: Pre-oxygenation with 100% oxygen Intubation Type: IV induction Ventilation: Mask ventilation without difficulty Laryngoscope Size: Mac and 4 Grade View: Grade I Tube type: Oral Tube size: 8.0 mm Number of attempts: 1 Airway Equipment and Method: Stylet Placement Confirmation: ETT inserted through vocal cords under direct vision,  positive ETCO2 and breath sounds checked- equal and bilateral Secured at: 22 cm Tube secured with: Tape Dental Injury: Teeth and Oropharynx as per pre-operative assessment       Right IJ vein image with needle in place

## 2015-07-03 NOTE — Progress Notes (Signed)
Dr Smith Robert made aware of BP and orders received for Phenylephrine with parameters, waiting for cardiology

## 2015-07-03 NOTE — Progress Notes (Signed)
Pt taken via bed to OR.  Telemetry box removed.  CCMD notified.  All pt belongings placed in bags including eye glasses.  Pt denies pain.  No s/s of distress.

## 2015-07-03 NOTE — Progress Notes (Signed)
ANTICOAGULATION CONSULT NOTE - Follow Up Consult  Pharmacy Consult for Heparin  Indication: pulmonary embolus  Allergies  Allergen Reactions  . Ace Inhibitors     cough    Patient Measurements: Height: 5\' 10"  (177.8 cm) Weight: 173 lb (78.472 kg) IBW/kg (Calculated) : 73  Vital Signs: Temp: 97.8 F (36.6 C) (10/25 2125) Temp Source: Oral (10/25 2125) BP: 124/94 mmHg (10/25 2125) Pulse Rate: 104 (10/25 2125)  Labs:  Recent Labs  07/01/15 1028  07/02/15 0336 07/02/15 1630 07/02/15 2050 07/03/15 0420  HGB  --   < > 10.2* 10.1* 10.2* 10.5*  HCT  --   < > 30.9* 30.4* 31.3* 31.7*  PLT  --   < > 424* 476* 423* 501*  APTT  --   --  49*  --  51*  --   LABPROT  --   --  18.8*  --  19.3*  --   INR  --   --  1.57*  --  1.62*  --   HEPARINUNFRC  --   --   --   --   --  0.30  CREATININE 1.0  --  1.04  --  1.24  --   < > = values in this interval not displayed.  Estimated Creatinine Clearance: 65.4 mL/min (by C-G formula based on Cr of 1.24).  Assessment: HL on lowest end of therapeutic range, would generally prefer higher for PE, no issues per RN.   Goal of Therapy:  Heparin level 0.3-0.7 units/ml Monitor platelets by anticoagulation protocol: Yes   Plan:  -Increase heparin to 1600 units/hr -1300 HL  Merisa Julio 07/03/2015,5:34 AM

## 2015-07-03 NOTE — Progress Notes (Signed)
EKG results called to Dr. Smith Robert, orders received, [facility] by to see patient, will notify [name].

## 2015-07-04 ENCOUNTER — Encounter (HOSPITAL_COMMUNITY): Payer: Self-pay | Admitting: Cardiothoracic Surgery

## 2015-07-04 ENCOUNTER — Inpatient Hospital Stay (HOSPITAL_COMMUNITY): Payer: BLUE CROSS/BLUE SHIELD | Admitting: Anesthesiology

## 2015-07-04 ENCOUNTER — Encounter (HOSPITAL_COMMUNITY)
Admission: AD | Disposition: A | Discharge status: Home Health | Payer: Self-pay | Source: Ambulatory Visit | Attending: Internal Medicine

## 2015-07-04 ENCOUNTER — Encounter: Payer: BLUE CROSS/BLUE SHIELD | Admitting: Cardiothoracic Surgery

## 2015-07-04 ENCOUNTER — Inpatient Hospital Stay (HOSPITAL_COMMUNITY): Payer: BLUE CROSS/BLUE SHIELD

## 2015-07-04 DIAGNOSIS — I4891 Unspecified atrial fibrillation: Secondary | ICD-10-CM

## 2015-07-04 DIAGNOSIS — M7989 Other specified soft tissue disorders: Secondary | ICD-10-CM

## 2015-07-04 HISTORY — PX: CARDIOVERSION: SHX1299

## 2015-07-04 LAB — BASIC METABOLIC PANEL
Anion gap: 9 (ref 5–15)
Anion gap: 9 (ref 5–15)
BUN: 23 mg/dL — ABNORMAL HIGH (ref 6–20)
BUN: 18 mg/dL (ref 6–20)
CALCIUM: 8.1 mg/dL — AB (ref 8.9–10.3)
CHLORIDE: 99 mmol/L — AB (ref 101–111)
CHLORIDE: 102 mmol/L (ref 101–111)
CO2: 21 mmol/L — AB (ref 22–32)
CO2: 21 mmol/L — AB (ref 22–32)
CREATININE: 0.9 mg/dL (ref 0.61–1.24)
CREATININE: 0.89 mg/dL (ref 0.61–1.24)
Calcium: 8.1 mg/dL — ABNORMAL LOW (ref 8.9–10.3)
GFR calc Af Amer: >60 mL/min (ref >60)
GFR calc Af Amer: >60 mL/min (ref >60)
GFR calc non Af Amer: >60 mL/min (ref >60)
GFR calc non Af Amer: >60 mL/min (ref >60)
Glucose, Bld: 150 mg/dL — ABNORMAL HIGH (ref 65–99)
Glucose, Bld: 149 mg/dL — ABNORMAL HIGH (ref 65–99)
Potassium: 4.3 mmol/L (ref 3.5–5.1)
Potassium: 3.9 mmol/L (ref 3.5–5.1)
SODIUM: 129 mmol/L — AB (ref 135–145)
SODIUM: 132 mmol/L — AB (ref 135–145)

## 2015-07-04 LAB — ALBUMIN, FLUID (OTHER): ALBUMIN FL: 1.1 g/dL

## 2015-07-04 LAB — GLUCOSE, CAPILLARY
GLUCOSE-CAPILLARY: 173 mg/dL — AB (ref 65–99)
GLUCOSE-CAPILLARY: 135 mg/dL — AB (ref 65–99)
GLUCOSE-CAPILLARY: 164 mg/dL — AB (ref 65–99)
Glucose-Capillary: 144 mg/dL — ABNORMAL HIGH (ref 65–99)

## 2015-07-04 LAB — CBC
HEMATOCRIT: 29.8 % — AB (ref 39.0–52.0)
HEMOGLOBIN: 9.8 g/dL — AB (ref 13.0–17.0)
MCH: 29.4 pg (ref 26.0–34.0)
MCHC: 32.9 g/dL (ref 30.0–36.0)
MCV: 89.5 fL (ref 78.0–100.0)
Platelets: 352 10*3/uL (ref 150–400)
RBC: 3.33 MIL/uL — AB (ref 4.22–5.81)
RDW: 16.4 % — ABNORMAL HIGH (ref 11.5–15.5)
WBC: 8.4 10*3/uL (ref 4.0–10.5)

## 2015-07-04 LAB — BLOOD GAS, ARTERIAL
ACID-BASE DEFICIT: 4.2 mmol/L — AB (ref 0.0–2.0)
BICARBONATE: 19.6 meq/L — AB (ref 20.0–24.0)
DRAWN BY: 441351
O2 CONTENT: 3 L/min
O2 Saturation: 95.9 %
PH ART: 7.418 (ref 7.350–7.450)
Patient temperature: 98.6
TCO2: 20.5 mmol/L (ref 0–100)
pCO2 arterial: 30.9 mmHg — ABNORMAL LOW (ref 35.0–45.0)
pO2, Arterial: 84.6 mmHg (ref 80.0–100.0)

## 2015-07-04 LAB — HEPARIN LEVEL (UNFRACTIONATED)
HEPARIN UNFRACTIONATED: 0.43 [IU]/mL (ref 0.30–0.70)
Heparin Unfractionated: 0.33 IU/mL (ref 0.30–0.70)

## 2015-07-04 LAB — PROTEIN, BODY FLUID

## 2015-07-04 LAB — GLUCOSE, SEROUS FLUID: GLUCOSE FL: 131 mg/dL

## 2015-07-04 LAB — BODY FLUID CELL COUNT WITH DIFFERENTIAL
Eos, Fluid: 0 %
Lymphs, Fluid: 28 %
MONOCYTE-MACROPHAGE-SEROUS FLUID: 25 % — AB (ref 50–90)
Neutrophil Count, Fluid: 47 % — ABNORMAL HIGH (ref 0–25)
Total Nucleated Cell Count, Fluid: 359 cu mm (ref 0–1000)

## 2015-07-04 LAB — LACTATE DEHYDROGENASE, PLEURAL OR PERITONEAL FLUID: LD, Fluid: 172 U/L — ABNORMAL HIGH (ref 3–23)

## 2015-07-04 LAB — PROTEIN, PERICARDIAL FLUID: Protein, Pericardial Fluid: 3.8 g/dL

## 2015-07-04 SURGERY — CARDIOVERSION
Anesthesia: Monitor Anesthesia Care

## 2015-07-04 MED ORDER — ZOLPIDEM TARTRATE 5 MG PO TABS
5.0000 mg | ORAL_TABLET | Freq: Once | ORAL | Status: AC
  Administered 2015-07-04: 5 mg via ORAL
  Filled 2015-07-04: qty 1

## 2015-07-04 MED ORDER — SODIUM CHLORIDE 0.9 % IV SOLN: INTRAVENOUS | Status: DC

## 2015-07-04 MED ORDER — PROPOFOL 10 MG/ML IV BOLUS
INTRAVENOUS | Status: DC | PRN
  Administered 2015-07-04: 40 mg via INTRAVENOUS

## 2015-07-04 MED ORDER — ALBUMIN HUMAN 5 % IV SOLN
25.0000 g | Freq: Once | INTRAVENOUS | Status: AC
  Administered 2015-07-04: 25 g via INTRAVENOUS
  Filled 2015-07-04: qty 500

## 2015-07-04 MED ORDER — FUROSEMIDE 10 MG/ML IJ SOLN
20.0000 mg | Freq: Once | INTRAMUSCULAR | Status: AC
  Administered 2015-07-04: 20 mg via INTRAVENOUS

## 2015-07-04 MED ORDER — HYDROCORTISONE 1 % EX CREA
1.0000 "application " | TOPICAL_CREAM | Freq: Three times a day (TID) | CUTANEOUS | Status: DC | PRN
  Filled 2015-07-04: qty 28

## 2015-07-04 NOTE — Progress Notes (Signed)
*  PRELIMINARY RESULTS* Vascular Ultrasound Lower extremity venous duplex has been completed.  Preliminary findings: No evidence of DVT or baker's cyst.   Landry Mellow, RDMS, RVT  07/04/2015, 11:31 AM

## 2015-07-04 NOTE — Progress Notes (Signed)
ANTICOAGULATION CONSULT NOTE - Follow Up Consult  Pharmacy Consult for Heparin  Indication: pulmonary embolus  Allergies  Allergen Reactions  . Ace Inhibitors     cough    Patient Measurements: Height: 5\' 10"  (177.8 cm) Weight: 173 lb (78.472 kg) IBW/kg (Calculated) : 73  Vital Signs: Temp: 97.8 F (36.6 C) (10/27 1100) Temp Source: Oral (10/27 1100) BP: 93/67 mmHg (10/27 0900) Pulse Rate: 126 (10/27 0900)  Labs:  Recent Labs  07/02/15 0336  07/02/15 2050  07/03/15 0420 07/03/15 2125 07/04/15 0425 07/04/15 1150  HGB 10.2*  < > 10.2*  --  10.5*  --  9.8*  --   HCT 30.9*  < > 31.3*  --  31.7*  --  29.8*  --   PLT 424*  < > 423*  --  501*  --  352  --   APTT 49*  --  51*  --   --   --   --   --   LABPROT 18.8*  --  19.3*  --   --   --   --   --   INR 1.57*  --  1.62*  --   --   --   --   --   HEPARINUNFRC  --   --   --   < > 0.30 0.18* 0.33 0.43  CREATININE 1.04  --  1.24  --   --   --  0.90 0.89  < > = values in this interval not displayed.  Estimated Creatinine Clearance: 91.1 mL/min (by C-G formula based on Cr of 0.89).  Assessment: 59 yo M with recent history of DVT and PE presents with new PE/DVT. Preliminary results of venous doppler show no evidence of DVT. Started on heparin bridge for EGD which will be 10/28 at 10:45. Last HL was therapeutic at 0.43. Hgb low at 9.8, plts wnl. No s/s of bleed  Goal of Therapy:  Heparin level 0.3-0.7 units/ml Monitor platelets by anticoagulation protocol: Yes   Plan:  Continue heparin gtt at 1,850 units/hr Stop heparin at 0645 tomorrow morning for EGD Monitor daily HL, CBC, s/s of bleed Follow up need to restart after procedure

## 2015-07-04 NOTE — Progress Notes (Signed)
Daily Rounding Note  07/04/2015, 8:15 AM  LOS: 3 days   SUBJECTIVE:       Still with cough, some pain at chest tube site on right. Weak but looking forward to getting up and walking today.  Last BM was 10/24. Tolerating bariatric full liquid diet.   Heart rate in 1teens to 120.  BPs at 80s/70s.   OBJECTIVE:         Vital signs in last 24 hours:    Temp:  [97.4 F (36.3 C)-98.7 F (37.1 C)] 97.6 F (36.4 C) (10/27 0341) Pulse Rate:  [52-141] 121 (10/27 0715) Resp:  [11-27] 20 (10/27 0715) BP: (78-124)/(54-91) 89/72 mmHg (10/27 0715) SpO2:  [89 %-97 %] 96 % (10/27 0715) Arterial Line BP: (69-117)/(53-87) 98/66 mmHg (10/27 0355) Last BM Date: 07/02/15 Filed Weights   07/01/15 1801  Weight: 173 lb (78.472 kg)   General: pale, weak appearing, looks consumptive.  Alert, comfortable   Heart: RRR Chest: diminished BS bil.  Coughing but unable to clear much phlegm.   Abdomen: soft, thin, NT, ND.  No mass.  BS active.   Extremities: no CCE Neuro/Psych:  Oriented x 3.  Moves all 4s.  No gross deficits.   Intake/Output from previous day: 10/26 0701 - 10/27 0700 In: 3604.4 [P.O.:360; I.V.:3244.4] Out: 1340 [Urine:1250; Blood:20; Chest Tube:70]  Intake/Output this shift:    Lab Results:  Recent Labs  07/02/15 2050 07/03/15 0420 07/04/15 0425  WBC 7.9 8.7 8.4  HGB 10.2* 10.5* 9.8*  HCT 31.3* 31.7* 29.8*  PLT 423* 501* 352   BMET  Recent Labs  07/02/15 0336 07/02/15 2050 07/04/15 0425  NA 130* 128* 129*  K 5.1 5.6* 4.3  CL 97* 95* 99*  CO2 24 22 21*  GLUCOSE 146* 130* 150*  BUN 22* 25* 23*  CREATININE 1.04 1.24 0.90  CALCIUM 8.8* 8.9 8.1*       ASSESMENT:   *  Dysphagia.  S/p esophagectomy and gastric pull 08/2014, radiation for tx of esophageal cancer dx 05/2014.. On taxol, herceptin.  10/24 esophagram showed pyloric stricture, no confirmation of esophageal mass. 10/24 Ct angio chest with new PE,  esophageal mass.  05/14/15 CT angio chest with new mets: right >>> left pleural effusion and bil pleural nodularity, mediastinal adenopathy, left adrenal nodule. Mixed inflammatory cells on cytology.   *  Pericardial effusion, pleural effusions.  S/p 10/26 pericardial window and pleurex catheter.   *  new A fib, rate to 130s, onset noted 10/26 ~ noon.  Onset after CV procedures.  Amiodarone, heparin drip in place.   *  PE.  On Heparin drip. .   *  Normocytic anemia.    *  Elevated BUN, mild.  With the low BP, wonder if he is a bit dry?   *  Protein malnutrition.  Albumin low at 2.2.   *  Rising transaminases, normal alk phos and T bili.  Lipase not checked but CT angio of chest. showed fatty liver.  Earlier 05/14/15 CT angio chest showed peri-pancreatic fat stranding, ? Pancreatitis vs mesenteritis.  However LFTs then wer normal, and Lipase not assayed.    Clinically pt has no abd pain or n/v.     PLAN   *  Plan EGD 10:45 tomorrow with possible baloon dilatation, MAC sedation.  Stop Heparin at 0645 tmrw.  However, if rapid a fib persists, may need to postpone.  EGD is not urgent.  Azucena Freed  07/04/2015, 8:15 AM Pager: [phone]  [facility] Attending  I have also seen and assessed the patient and agree with the advanced practitioner's assessment and plan. Seen earlier. Has been cardioverted. EGD possible dilation tomorrow if CV/resp status allow.  The risks and benefits as well as alternatives of endoscopic procedure(s) have been discussed and reviewed. All questions answered. The patient agrees to proceed.  Gatha Mayer, MD, [facility] [phone] (pager) 07/04/2015 5:01 PM

## 2015-07-04 NOTE — Progress Notes (Signed)
Patient ID: Joel Miller, male   DOB: [birth year], 59 y.o.   MRN: [id number] Bedside [facility] Anesthesia Dr Ermalene Postin  40 mg Propofol Prior to procedure respiratory status worse  Diffuse rhonchi decrease BS R base tachypnic sats 90% DCC x 1 less than 24 hrs from onset afib on heparin  Converted from afib rate 144 to NSR rate 95 No immediate neurologic sequelae  Lasix 20 mg give iv for congestin and likely re expansion pulmonary edema with right effusion  Family updated  Jenkins Rouge

## 2015-07-04 NOTE — Op Note (Signed)
NAME:  Joel Miller, Joel Miller NO.:  [id number]  MEDICAL RECORD NO.:  [id number]  LOCATION:  3S01C                        FACILITY:  [facility]  PHYSICIAN:  [name], MD    DATE OF BIRTH:  02-25-1956  DATE OF PROCEDURE:  07/03/2015 DATE OF DISCHARGE:                              OPERATIVE REPORT   PREOPERATIVE DIAGNOSES:  History of esophageal cancer with recent pulmonary embolus, large pericardial effusion and increasing left pleural effusion suspected to be malignant effusion.  POSTOPERATIVE DIAGNOSES:  History of esophageal cancer with recent pulmonary embolus, large pericardial effusion and increasing left pleural effusion suspected to be malignant effusion.  PROCEDURE:  Subxiphoid pericardial window with drainage of pericardial effusion.  Left PleurX catheter and drainage of left pleural effusion with ultrasound fluoroscopic guidance.  SURGEON:  [name], MD  FIRST ASSISTANT:  Lars Pinks, PA  BRIEF HISTORY:  The patient is a 59 year old male who previously had undergone a transhiatal total esophagectomy for advanced stage carcinoma of the esophagus.  After treatment with preoperative radiation and chemo in the late summer of 2016, he had a clear chest x-ray.  Soon after that, he rapidly developed a right pleural effusion.  This was tapped and ultimately right PleurX catheter was placed for recurrent pleural effusion.  Cytologies on this did not confirm malignancy; however, the patient was started on Herceptin and improved to some degree.  The day prior to surgery, he presented to the [facility] with increasing shortness of breath and fatigue, difficulty taking p.o. diet.  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  Because of these findings, it was recommended to the patient to proceed with subxiphoid pericardial window and drainage of pericardial fluid and placement of the PleurX catheter.  Following stabilization with these two, GI to perform endoscopy and biopsy of the pyloric area and consideration of dilatation and/or stenting.  The patient agreed and signed informed consent.  DESCRIPTION OF PROCEDURE:  With the arterial line and central line in place, the patient underwent general endotracheal anesthesia.  The anterior chest and left chest was prepped with Betadine and draped in sterile manner.  Appropriate time-out was performed.  A small incision was made over the xiphoid process and dissection carried through the scar tissue from the previous surgery down to the pericardium, which was easily identified.  The pericardium was opened and 400-500 mL of dark- colored fluid was removed.  Appropriate studies were obtained, cultures and cytology.  TEE was not placed because of the patient's previous esophagectomy.  A  28 Blake tube was left in the inferior recess of the pericardium and brought out through a separate site to the right of the incision.  Primary incision was then closed with interrupted 0 Vicryl, running 3-0 Vicryl subcutaneous tissue, and a 3-0 subcuticular stitch. Dermabond was applied.  We then proceeded with placement of a left PleurX catheter.  The left side had previously been marked.  Using SonoSite ultrasound, the site of significant pleural fluid  was noted. 16-gauge needle was introduced into the chest cavity and through this with good return of pleural fluid.  With fluoroscopic guidance, a guidewire was placed into the pleural space.  Small counter incision made anterior to this and PleurX was tunneled subcutaneously.  A dilator was placed over the wire.  A peel-away sheath was then placed and PleurX catheter was introduced into the left chest.  There was free  flow of fluid.  The fluoroscopy showed adequate position of the catheter without evidence of pneumothorax.  A 14-1500 mL of fluid was removed. Appropriate studies were obtained including cytology.  The incisions were closed, the tube was secured in place, Dermabond and dressing was applied.  The patient tolerated the procedure well without obvious complication.  He was extubated in the operating room and transferred to the recovery room for postoperative care and he will be restarted on his heparin.  He tolerated the procedure without obvious complication. Sponge and needle count was reported as correct at completion of procedure.     [name], MD     EG/MEDQ  D:  07/04/2015  T:  07/04/2015  Job:  [id number]

## 2015-07-04 NOTE — Progress Notes (Signed)
Initial Nutrition Assessment  DOCUMENTATION CODES:   Severe malnutrition in context of chronic illness  INTERVENTION:    Boost Breeze po TID, each supplement provides 250 kcal and 9 grams of protein  NUTRITION DIAGNOSIS:   Malnutrition related to chronic illness as evidenced by severe depletion of muscle mass, severe depletion of body fat.  GOAL:   Patient will meet greater than or equal to 90% of their needs  MONITOR:   PO intake, Supplement acceptance, Diet advancement, Weight trends, Labs  REASON FOR ASSESSMENT:   Consult, Malnutrition Screening Tool Diet education  ASSESSMENT:   59 y.o. 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 Currently on Taxol and Herceptin. Patient also now complaining of 4 days of worsening dysphagia. Patient only able to take small bites of banana and takes him approximately 1 hour to eat an entire banana; states that food feels like it gets stuck in his throat.  Also with LE swelling.  Spoke with RN and patient. He has been eating minimally for the past week, only able to swallow water and thin broth soups. Prior to that he could eat some soft pureed foods, such as applesauce. He has lost ~7% of his usual weight over the past 1.25 months. Nutrition-Focused physical exam completed. Findings are severe fat depletion, severe muscle depletion, and moderate edema. Patient with severe PCM. Currently NPO for cardioversion later today. Plans for esophageal dilatation tomorrow. Patient willing to try Breeze supplements later today.  Diet Order:  Diet NPO time specified Diet NPO time specified  Skin:  Reviewed, no issues  Last BM:  10/25  Height:   Ht Readings from Last 1 Encounters:  07/01/15 5\' 10"  (1.778 m)    Weight:   Wt Readings from Last 1 Encounters:  07/01/15 173 lb (78.472 kg)    Ideal Body Weight:  75.5 kg  BMI:  Body mass index is 24.82  kg/(m^2).  Estimated Nutritional Needs:   Kcal:  2200-2400  Protein:  110-130 gm  Fluid:  2.2-2.4 L  EDUCATION NEEDS:   Education needs addressed  Molli Barrows, Anton Ruiz, [place], [place] Pager [phone] After Hours Pager [phone]

## 2015-07-04 NOTE — Transfer of Care (Signed)
Immediate Anesthesia Transfer of Care Note  Patient: Joel Miller  Procedure(s) Performed: Procedure(s): Bedside CARDIOVERSION (N/A)  Patient Location: Nursing Unit  Anesthesia Type:General  Level of Consciousness: awake, alert  and oriented  Airway & Oxygen Therapy: Patient Spontanous Breathing and Patient connected to nasal cannula oxygen  Post-op Assessment: Report given to RN, Post -op Vital signs reviewed and stable and Patient moving all extremities  Post vital signs: Reviewed and stable  Last Vitals:  Filed Vitals:   07/04/15 1100  BP:   Pulse:   Temp: 36.6 C  Resp:     Complications: No apparent anesthesia complications

## 2015-07-04 NOTE — Progress Notes (Signed)
Paged MD on call regarding pt needing something to help with sleep. Awaiting new orders.

## 2015-07-04 NOTE — Progress Notes (Signed)
Pt had received Albumin at 1900 pressures have continued to be 61'Q systolic, otherwise VSS pt is asymptomatic paged Tylene Fantasia who ordered 25 of albumin will continue to monitor.

## 2015-07-04 NOTE — Anesthesia Preprocedure Evaluation (Addendum)
Anesthesia Evaluation  Patient identified by MRN, date of birth, ID band Patient awake    Reviewed: Allergy & Precautions, NPO status , Patient's Chart, lab work & pertinent test results  History of Anesthesia Complications Negative for: history of anesthetic complications  Airway Mallampati: II  TM Distance: >3 FB Neck ROM: Full    Dental no notable dental hx.    Pulmonary shortness of breath and at rest, former smoker,       rales    Cardiovascular hypertension, Pt. on medications + dysrhythmias Atrial Fibrillation  Rhythm:Irregular Rate:Tachycardia     Neuro/Psych negative neurological ROS  negative psych ROS   GI/Hepatic Neg liver ROS, GERD  ,  Endo/Other  diabetes  Renal/GU negative Renal ROS  negative genitourinary   Musculoskeletal negative musculoskeletal ROS (+)   Abdominal   Peds negative pediatric ROS (+)  Hematology  (+) anemia ,   Anesthesia Other Findings   Reproductive/Obstetrics negative OB ROS                            Lab Results  Component Value Date   WBC 8.4 07/04/2015   HGB 9.8* 07/04/2015   HCT 29.8* 07/04/2015   MCV 89.5 07/04/2015   PLT 352 07/04/2015   Lab Results  Component Value Date   CREATININE 0.89 07/04/2015   BUN 18 07/04/2015   NA 132* 07/04/2015   K 3.9 07/04/2015   CL 102 07/04/2015   CO2 21* 07/04/2015   Lab Results  Component Value Date   INR 1.62* 07/02/2015   INR 1.57* 07/02/2015   INR 1.30 05/24/2015    EKG: atrial fibrillation, rate 135.   Anesthesia Physical Anesthesia Plan  ASA: IV  Anesthesia Plan: General   Post-op Pain Management:    Induction: Intravenous  Airway Management Planned: Natural Airway and Simple Face Mask  Additional Equipment:   Intra-op Plan:   Post-operative Plan:   Informed Consent: I have reviewed the patients History and Physical, chart, labs and discussed the procedure including the  risks, benefits and alternatives for the proposed anesthesia with the patient or authorized representative who has indicated his/her understanding and acceptance.   Dental advisory given  Plan Discussed with: CRNA and Surgeon  Anesthesia Plan Comments: (Mr. Furio has CVC & Arterial line in place. )       Anesthesia Quick Evaluation

## 2015-07-04 NOTE — Progress Notes (Addendum)
      [address]       [place] [address]             [phone]       1 Day Post-Op Procedure(s) (LRB): SUBXYPHOID PERICARDIAL WINDOW (N/A) INSERTION PLEURAL DRAINAGE CATHETER (Left)  Subjective: Patient sitting in chair drinking coffee. He states his breathing is ok.  Objective: Vital signs in last 24 hours: Temp:  [97.4 F (36.3 C)-98.7 F (37.1 C)] 97.6 F (36.4 C) (10/27 0341) Pulse Rate:  [52-141] 121 (10/27 0715) Cardiac Rhythm:  [-] Atrial fibrillation (10/27 0300) Resp:  [11-27] 20 (10/27 0715) BP: (78-124)/(54-91) 89/72 mmHg (10/27 0715) SpO2:  [89 %-97 %] 96 % (10/27 0715) Arterial Line BP: (69-117)/(53-87) 98/66 mmHg (10/27 0355)     Intake/Output from previous day: 10/26 0701 - 10/27 0700 In: 3604.4 [P.O.:360; I.V.:3244.4] Out: 1340 [Urine:1250; Blood:20; Chest Tube:70]   Physical Exam:  Cardiovascular: IRRR IRRR Pulmonary: Crackles bilaterally Abdomen: Soft, non tender, bowel sounds present. Extremities: Mild bilateral lower extremity edema. Wounds: Clean and dry.  No erythema or signs of infection. Pericardial Tube: to suction  Lab Results: CBC: Recent Labs  07/03/15 0420 07/04/15 0425  WBC 8.7 8.4  HGB 10.5* 9.8*  HCT 31.7* 29.8*  PLT 501* 352   BMET:  Recent Labs  07/02/15 2050 07/04/15 0425  NA 128* 129*  K 5.6* 4.3  CL 95* 99*  CO2 22 21*  GLUCOSE 130* 150*  BUN 25* 23*  CREATININE 1.24 0.90  CALCIUM 8.9 8.1*    PT/INR:  Recent Labs  07/02/15 2050  LABPROT 19.3*  INR 1.62*   ABG:  INR: Will add last result for INR, ABG once components are confirmed Will add last 4 CBG results once components are confirmed  Assessment/Plan:  1. CV - A fib with HR in the 110's. On Amiodarone drip. BP labile so unable to start BB. Will discuss with Dr. Servando Snare if should give another albumin to help with BP. Cardiology following as well. 2.  Pulmonary - Pericardial tube with 70 cc last 24 hours . Tube to remain  for today. Patient with bilateral pleur x catheters. Drainage days to be Monday/Wednesday/Friday. CXR shows no pneumothorax, small bilateral pleural effusions, cardiomegaly, bibasilar atelectasis, and pulmonary congestion. 3. On Heparin drip for PE. Dosing per pharmacy. 4. Anemia-H and H slightly decreased to 9.8 and 29.8 5. Hyponatremia-sodium remains 129 6. GI-history of esophageal cancer (s/p chemo,XRT, and esophagectomy). Needs EGD, possible balloon dilatation of pylorus. Hopefully, to be done in am. 7. Remove a line and decrease IVF 8. Patient requesting foley to be removed. Will do so later this am  ZIMMERMAN,DONIELLE MPA-C 07/04/2015,8:29 AM  Discussed with cardiology, cardioversion from afib today D/c pericardial tube in early am Endoscopy, dilation tomorrow I have seen and examined Josephine Cables and agree with the above assessment  and plan.  [name] MD Beeper [phone] Office [phone] 07/04/2015 9:58 AM

## 2015-07-04 NOTE — Anesthesia Procedure Notes (Signed)
Date/Time: 07/04/2015 3:57 PM Performed by: Merrilyn Puma B Pre-anesthesia Checklist: Patient identified, Emergency Drugs available, Suction available, Patient being monitored and Timeout performed Oxygen Delivery Method: Ambu bag Intubation Type: IV induction Ventilation: Mask ventilation without difficulty Placement Confirmation: breath sounds checked- equal and bilateral Dental Injury: Teeth and Oropharynx as per pre-operative assessment

## 2015-07-04 NOTE — Progress Notes (Signed)
ANTICOAGULATION CONSULT NOTE - Follow Up Consult  Pharmacy Consult for heparin Indication: pulmonary embolus   Labs:  Recent Labs  07/02/15 0336  07/02/15 2050 07/03/15 0420 07/03/15 2125 07/04/15 0425  HGB 10.2*  < > 10.2* 10.5*  --  9.8*  HCT 30.9*  < > 31.3* 31.7*  --  29.8*  PLT 424*  < > 423* 501*  --  352  APTT 49*  --  51*  --   --   --   LABPROT 18.8*  --  19.3*  --   --   --   INR 1.57*  --  1.62*  --   --   --   HEPARINUNFRC  --   --   --  0.30 0.18* 0.33  CREATININE 1.04  --  1.24  --   --  0.90  < > = values in this interval not displayed.   Assessment/Plan:  59yo male therapeutic on heparin after rate increase. Will continue gtt at current rate and confirm stable with additional level.   Wynona Neat, PharmD, BCPS  07/04/2015,5:23 AM

## 2015-07-04 NOTE — Progress Notes (Signed)
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 No complaints. Awaiting possible DCCV this afternoon   CURRENT MEDS . bisacodyl  10 mg Oral Daily  . cefUROXime (ZINACEF)  IV  1.5 g Intravenous Q12H  . feeding supplement  1 Container Oral TID BM  . fentaNYL   Intravenous 6 times per day  . insulin aspart  0-9 Units Subcutaneous TID WC  . predniSONE  10 mg Oral Q breakfast  . senna-docusate  1 tablet Oral QHS  . sodium chloride  3 mL Intravenous Q12H  . sodium chloride  3 mL Intravenous Q12H    OBJECTIVE  Filed Vitals:   07/04/15 0341 07/04/15 0400 07/04/15 0600 07/04/15 0715  BP:  88/65 88/54 89/72   Pulse:  112 115 121  Temp: 97.6 F (36.4 C)   97.7 F (36.5 C)  TempSrc: Oral   Oral  Resp:  22 22 20   Height:      Weight:      SpO2:  92% 93% 96%    Intake/Output Summary (Last 24 hours) at 07/04/15 0908 Last data filed at 07/04/15 0700  Gross per 24 hour  Intake 3604.38 ml  Output   1540 ml  Net 2064.38 ml   Filed Weights   07/01/15 1801  Weight: 173 lb (78.472 kg)    PHYSICAL EXAM  Chronically ill white male  HEENT: normal Neck supple with no adenopathy right IJ  JVP normal no bruits no thyromegaly Lungs decreased BS bases no wheezing and good diaphragmatic motion Bilateral Pleur X catheters left new  Heart: irre irreg, tachy. S1/S2 no murmur, no rub, gallop or click Subxiphoid drain in from window  PMI normal Abdomen: benighn, BS positve, no tenderness, no  AAA no bruit. No HSM or HJR Distal pulses intact with no bruits 1+ edema in all extremities Neuro non-focal Skin warm and dry  Accessory Clinical Findings  CBC  Recent Labs  07/01/15 1028  07/03/15 0420 07/04/15 0425  WBC 7.3  < > 8.7 8.4  NEUTROABS 6.5  --   --   --   HGB 10.8*  < > 10.5* 9.8*  HCT 32.2*  < > 31.7* 29.8*  MCV 89.3  < > 89.8 89.5  PLT 503*  < > 501* 352  < > = values in this interval not displayed. Basic Metabolic Panel  Recent Labs  07/02/15 2050 07/04/15 0425  NA 128* 129*  K 5.6* 4.3  CL 95* 99*  CO2 22 21*  GLUCOSE 130* 150*  BUN 25* 23*  CREATININE 1.24 0.90  CALCIUM 8.9 8.1*   Liver Function Tests  Recent Labs  07/01/15 1028 07/02/15 1630 07/02/15 2050  AST 29  --  167*  ALT 22  --  152*  ALKPHOS 130  --  107  BILITOT 0.51  --  0.8  PROT 6.4 5.9* 5.8*  ALBUMIN 2.4* 2.3* 2.2*      TELE  afib with RVR  Radiology/Studies  Dg Chest  2 View  06/13/2015  CLINICAL DATA:  History of esophageal malignancy with surgery in January of 2016, history of pleural effusions, currently asymptomatic. EXAM: CHEST  2 VIEW COMPARISON:  Portable chest x-ray of May 24, 2015 FINDINGS: There remains a smaller moderate-sized right pleural effusion. The small caliber right chest tube is unchanged in position at the inferior aspect of the pleural space. A small left pleural effusion has increased in size since September. There is no pneumothorax. The pulmonary interstitial markings are slightly more conspicuous today especially on the left. The heart is normal in size. The pulmonary vascularity is not engorged. The bony thorax exhibits no acute abnormality. IMPRESSION: 1. Mildly increased pulmonary interstitial markings especially on the left since the previous study. This may reflect interstitial edema or early interstitial pneumonia. 2. Stable small a moderate sized right pleural effusion with stable small caliber chest tube. Slight interval increase in  the volume of the small left pleural effusion. Electronically Signed   By: David  Martinique M.D.   On: 06/13/2015 14:22   Ct Angio Chest Pe W/cm &/or Wo Cm  07/01/2015  ADDENDUM REPORT: 07/01/2015 15:55 ADDENDUM: The left pleural effusion is larger than the right pleural effusion. The statement in the text is error; the statement in the impression is correct. Electronically Signed   By: Lowella Grip III M.D.   On: 07/01/2015 15:55  07/01/2015  CLINICAL DATA:  Shortness of Breath.  Esophageal carcinoma EXAM: CT ANGIOGRAPHY CHEST WITH CONTRAST TECHNIQUE: Multidetector CT imaging of the chest was performed using the standard protocol during bolus administration of intravenous contrast. Multiplanar CT image reconstructions and MIPs were obtained to evaluate the vascular anatomy. CONTRAST:  1108mL OMNIPAQUE IOHEXOL 350 MG/ML SOLN COMPARISON:  Chest CT May 14, 2015; chest radiograph June 13, 2015 FINDINGS: There are focal pulmonary emboli in the proximal lingular pulmonary artery branches causing partial obstruction. No other focal pulmonary emboli are appreciable. No more central pulmonary embolus seen. No evidence of right heart strain. There is no thoracic aortic aneurysm or dissection. Visualized great vessels appear unremarkable. There are bilateral pleural effusions, larger on the right than on the left. There is a chest tube at the right base with loculation of fluid on the right. There is patchy atelectasis in both lower lobes, more on the right than on the left. There is distention of the upper esophagus with an abrupt narrowing of the esophagus at the level of the carina. There is soft tissue fullness in this area, likely due to mass in the region of the esophagus at and below the carina. Thyroid appears unremarkable. There are multiple mildly prominent mediastinal lymph nodes, largest measuring 2.0 x 1.7 cm in the aortopulmonary window region. There is a sizable pericardial effusion. In the  visualized upper abdomen, there are stable adrenal masses bilaterally. The larger masses on the left measuring 2.4 x 1.3 cm. There is hepatic steatosis. There is degenerative change in the thoracic spine. No well-defined sclerotic or lytic bone lesions are identified. Review of the MIP images confirms the above findings. IMPRESSION: There is a focal pulmonary emboli in the proximal lingular pulmonary artery branches. No larger pulmonary emboli identified. No right heart strain. Bilateral pleural effusions, larger on the left than on the right. Areas of patchy atelectasis in both lung bases. Pleural drainage catheter present on the right. Sizable pericardial effusion present. Several small aortopulmonary window lymph nodes present. Mass in the esophagus at the level of the carina. Hepatic steatosis. Stable adrenal masses bilaterally. Critical  Value/emergent results were called by telephone at the time of interpretation on 07/01/2015 at 2:42 pm to [facility], NP , who verbally acknowledged these results. Electronically Signed: By: Lowella Grip III M.D. On: 07/01/2015 14:42   Dg Esophagus  07/01/2015  CLINICAL DATA:  History of esophageal cancer status post total esophagectomy. Additional surgical history of jejunostomy and pyloroplasty. EXAM: ESOPHOGRAM/BARIUM SWALLOW TECHNIQUE: Single contrast examination was performed using  thin barium. FLUOROSCOPY TIME:  If the device does not provide the exposure index: Fluoroscopy Time:  1 minutes 43 seconds Number of Acquired Images:  28 COMPARISON:  Chest CT from earlier same day. FINDINGS: Initial swallows show evidence of patient's esophagectomy and gastric pull-up. Contrast moved promptly through the gastric pull-up to the level of the diaphragm. No evidence of intrinsic or extrinsic wall irregularities within the upper or mid portions of the gastric pull-up. A persistent narrowing was then identified in the region of the gastric pylorus. When contrast reached  this area, patient did have coughing fits and slight regurgitation. Eventually, contrast did slowly pass from the slightly distended gastric pull-up to the proximal small bowel. No extraluminal contrast material seen. IMPRESSION: Persistent high grade narrowing in the region of the gastric pylorus. The smooth narrowing is more likely a benign stricture caused by thickening related to the previous pyloroplasty than due to a neoplastic cause. Appearance also is not suggestive of extrinsic narrowing. Electronically Signed   By: Franki Cabot M.D.   On: 07/01/2015 16:14   Dg Chest Port 1 View  07/04/2015  CLINICAL DATA:  Bilateral pleural effusions EXAM: PORTABLE CHEST 1 VIEW COMPARISON:  Chest radiograph from one day prior. FINDINGS: Right internal jugular central venous catheter terminates at the cavoatrial junction. Bibasilar pleural catheters are stable in position. Stable cardiomediastinal silhouette with mild cardiomegaly. No pneumothorax. Small right and trace left pleural effusions appear stable. There is mild pulmonary edema, which appears slightly worsened. Patchy opacity at the right greater than left lung bases appear slightly worse at the right lung base. Retained oral contrast is noted in the left upper quadrant of the abdomen. IMPRESSION: 1. Stable small right and trace left pleural effusions. 2. Stable mild cardiomegaly with increased mild pulmonary edema, suggesting worsening mild congestive heart failure. 3. Patchy bibasilar lung opacities, right greater than left, increased on the right, favor atelectasis. Electronically Signed   By: Ilona Sorrel M.D.   On: 07/04/2015 08:02   Dg Chest Port 1 View  07/03/2015  CLINICAL DATA:  Status post pericardial window/strain on the right. PleurX catheter placed on the left. Bilateral pleural effusions. EXAM: PORTABLE CHEST 1 VIEW COMPARISON:  06/13/2015 FINDINGS: Stable chest tube curls in the right lower hemi thorax. There is a persistent moderate right  pleural effusion obscuring the hemidiaphragm. New left sided chest tube projects at the left lung base. Left pleural fluid appears decreased from the prior study, now small in amount. No pneumothorax. Right lower lung zone opacity is similar to the priors exam allowing for differences in patient positioning and technique. This is likely atelectasis and/or scarring. No new lung opacities. Right internal jugular central venous line is new from the prior study. Tip projects at the caval atrial junction. IMPRESSION: 1. Decreased left pleural fluid following left PleurX catheter placement. 2. Persistent right pleural fluid. No change in the right lower hemi thorax chest tube. 3. No pneumothorax. 4. New right internal jugular central venous line is well positioned with its tip at the caval atrial junction. Electronically Signed  By: Lajean Manes M.D.   On: 07/03/2015 11:14   Dg Fluoro Guide Cv Line-no Report  07/03/2015  CLINICAL DATA:  FLOURO GUIDE CV LINE Fluoroscopy was utilized by the requesting physician.  No radiographic interpretation.    ASSESSMENT AND PLAN  PAF: Started on amiodarone gtt but rates still 120-130s. BP too soft for AV nodal blocking drugs. Back on heparin gtt per [facility]. Recent DVT and PE.  -- We will plan to proceed with bedside DCCV today. Arranging with anaesthesia. This is new onset atrial fibrillation which started less than 48 hours ago.   Hypotension- he received albumin yesterday  BP still soft. Continue amio gtt.   Pericardial Effusion: likely malignant post window not much drainage  Pleural Effusion: old right Pleur X cytology not diagnostic. New left pleur X  Esophageal CA: Prognosis appears poor. 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 He will need his HR better controlled. they will discontinue heparin for a short period to do this.   Judy Pimple PA-C  Pager [phone]  Patient  known form PACU yesterday.  Still in rapid afib  Rate control limited by hypotension.  GI would like to hold heparin and do EGD in am.  Discussed with Dr Servando Snare and patient 24 hr time window to [facility] without as much Worry about emboli.  Restoration of SR would help hemodynamics.  Will try to arrange with anesthesia.  He had minimal liquids this am.  Can do [place] on heparin but possibly stop in am for EGD since he will have been [facility] within 2 hrs.    Jenkins Rouge

## 2015-07-04 NOTE — Anesthesia Postprocedure Evaluation (Signed)
  Anesthesia Post-op Note  Patient: Joel Miller  Procedure(s) Performed: Procedure(s): Bedside CARDIOVERSION (N/A)  Patient Location: SICU  Anesthesia Type:General  Level of Consciousness: awake  Airway and Oxygen Therapy: Patient Spontanous Breathing and Patient connected to nasal cannula oxygen  Post-op Pain: none  Post-op Assessment: Post-op Vital signs reviewed, Patient's Cardiovascular Status Stable, Respiratory Function Stable, Patent Airway, No signs of Nausea or vomiting and Pain level controlled              Post-op Vital Signs: Reviewed and stable  Last Vitals:  Filed Vitals:   07/04/15 1645  BP: 100/57  Pulse: 94  Temp:   Resp: 24    Complications: No apparent anesthesia complications

## 2015-07-05 ENCOUNTER — Encounter (HOSPITAL_COMMUNITY)
Admission: AD | Disposition: A | Discharge status: Home Health | Payer: Self-pay | Source: Ambulatory Visit | Attending: Internal Medicine

## 2015-07-05 ENCOUNTER — Inpatient Hospital Stay (HOSPITAL_COMMUNITY): Payer: BLUE CROSS/BLUE SHIELD | Admitting: Anesthesiology

## 2015-07-05 ENCOUNTER — Encounter (HOSPITAL_COMMUNITY): Payer: Self-pay | Admitting: *Deleted

## 2015-07-05 ENCOUNTER — Inpatient Hospital Stay (HOSPITAL_COMMUNITY): Payer: BLUE CROSS/BLUE SHIELD

## 2015-07-05 HISTORY — PX: ESOPHAGOGASTRODUODENOSCOPY: SHX5428

## 2015-07-05 LAB — GLUCOSE, CAPILLARY
Glucose-Capillary: 130 mg/dL — ABNORMAL HIGH (ref 65–99)
Glucose-Capillary: 146 mg/dL — ABNORMAL HIGH (ref 65–99)
Glucose-Capillary: 122 mg/dL — ABNORMAL HIGH (ref 65–99)
Glucose-Capillary: 139 mg/dL — ABNORMAL HIGH (ref 65–99)

## 2015-07-05 LAB — CBC
HEMATOCRIT: 30.9 % — AB (ref 39.0–52.0)
HEMOGLOBIN: 9.9 g/dL — AB (ref 13.0–17.0)
MCH: 28.6 pg (ref 26.0–34.0)
MCHC: 32 g/dL (ref 30.0–36.0)
MCV: 89.3 fL (ref 78.0–100.0)
Platelets: 354 10*3/uL (ref 150–400)
RBC: 3.46 MIL/uL — ABNORMAL LOW (ref 4.22–5.81)
RDW: 16.7 % — AB (ref 11.5–15.5)
WBC: 7.5 10*3/uL (ref 4.0–10.5)

## 2015-07-05 LAB — COMPREHENSIVE METABOLIC PANEL
ALBUMIN: 2 g/dL — AB (ref 3.5–5.0)
ALK PHOS: 84 U/L (ref 38–126)
ALT: 75 U/L — AB (ref 17–63)
AST: 45 U/L — AB (ref 15–41)
Anion gap: 6 (ref 5–15)
BILIRUBIN TOTAL: 0.6 mg/dL (ref 0.3–1.2)
BUN: 16 mg/dL (ref 6–20)
CALCIUM: 7.9 mg/dL — AB (ref 8.9–10.3)
CO2: 25 mmol/L (ref 22–32)
CREATININE: 0.81 mg/dL (ref 0.61–1.24)
Chloride: 99 mmol/L — ABNORMAL LOW (ref 101–111)
GFR calc Af Amer: >60 mL/min (ref >60)
GLUCOSE: 120 mg/dL — AB (ref 65–99)
POTASSIUM: 3.6 mmol/L (ref 3.5–5.1)
Sodium: 130 mmol/L — ABNORMAL LOW (ref 135–145)
TOTAL PROTEIN: 4.6 g/dL — AB (ref 6.5–8.1)

## 2015-07-05 LAB — HEPARIN LEVEL (UNFRACTIONATED)
HEPARIN UNFRACTIONATED: 0.54 [IU]/mL (ref 0.30–0.70)
Heparin Unfractionated: 0.23 IU/mL — ABNORMAL LOW (ref 0.30–0.70)

## 2015-07-05 LAB — PATHOLOGIST SMEAR REVIEW

## 2015-07-05 SURGERY — EGD (ESOPHAGOGASTRODUODENOSCOPY)
Anesthesia: Monitor Anesthesia Care

## 2015-07-05 MED ORDER — PHENYLEPHRINE HCL 10 MG/ML IJ SOLN
INTRAMUSCULAR | Status: DC | PRN
  Administered 2015-07-05: 80 ug via INTRAVENOUS

## 2015-07-05 MED ORDER — AMIODARONE HCL 200 MG PO TABS
200.0000 mg | ORAL_TABLET | Freq: Two times a day (BID) | ORAL | Status: DC
  Administered 2015-07-05 – 2015-07-06 (×3): 200 mg via ORAL
  Filled 2015-07-05 (×4): qty 1

## 2015-07-05 MED ORDER — HEPARIN (PORCINE) IN NACL 100-0.45 UNIT/ML-% IJ SOLN
1850.0000 [IU]/h | INTRAMUSCULAR | Status: DC
  Administered 2015-07-05: 1850 [IU]/h via INTRAVENOUS
  Administered 2015-07-06: 1950 [IU]/h via INTRAVENOUS
  Filled 2015-07-05 (×3): qty 250

## 2015-07-05 MED ORDER — LIDOCAINE HCL (CARDIAC) 20 MG/ML IV SOLN
INTRAVENOUS | Status: DC | PRN
  Administered 2015-07-05: 50 mg via INTRAVENOUS

## 2015-07-05 MED ORDER — POTASSIUM CHLORIDE CRYS ER 20 MEQ PO TBCR
40.0000 meq | EXTENDED_RELEASE_TABLET | Freq: Once | ORAL | Status: AC
  Administered 2015-07-05: 40 meq via ORAL
  Filled 2015-07-05: qty 2

## 2015-07-05 MED ORDER — ZOLPIDEM TARTRATE 5 MG PO TABS
5.0000 mg | ORAL_TABLET | Freq: Every evening | ORAL | Status: DC | PRN
  Administered 2015-07-05: 5 mg via ORAL
  Filled 2015-07-05: qty 1

## 2015-07-05 MED ORDER — PROPOFOL 10 MG/ML IV BOLUS
INTRAVENOUS | Status: DC | PRN
  Administered 2015-07-05 (×2): 40 mg via INTRAVENOUS

## 2015-07-05 NOTE — Progress Notes (Signed)
ANTICOAGULATION CONSULT NOTE - Follow Up Consult  Pharmacy Consult for Heparin  Indication: pulmonary embolus  Allergies  Allergen Reactions  . Ace Inhibitors     cough    Patient Measurements: Height: 5\' 10"  (177.8 cm) Weight: 173 lb (78.472 kg) IBW/kg (Calculated) : 73  Vital Signs: Temp: 97.9 F (36.6 C) (10/28 1045) Temp Source: Oral (10/28 1002) BP: 107/64 mmHg (10/28 1141) Pulse Rate: 94 (10/28 1141)  Labs:  Recent Labs  07/02/15 2050 07/03/15 0420  07/04/15 0425 07/04/15 1150 07/05/15 0500  HGB 10.2* 10.5*  --  9.8*  --  9.9*  HCT 31.3* 31.7*  --  29.8*  --  30.9*  PLT 423* 501*  --  352  --  354  APTT 51*  --   --   --   --   --   LABPROT 19.3*  --   --   --   --   --   INR 1.62*  --   --   --   --   --   HEPARINUNFRC  --  0.30  < > 0.33 0.43 0.54  CREATININE 1.24  --   --  0.90 0.89 0.81  < > = values in this interval not displayed.  Estimated Creatinine Clearance: 100.1 mL/min (by C-G formula based on Cr of 0.81).  Assessment: 59 yo M on heparin gtt for new PE/DVT. Last HL was therapeutic at 0.53 with a rate of 1850 units/hr. Heparin held this am for EGD and now to restart after procedure  Goal of Therapy:  Heparin level 0.3-0.7 units/ml Monitor platelets by anticoagulation protocol: Yes   Plan:  Restart heparin gtt at 1,850 units/hr Check 6 hr HL Monitor daily HL, CBC, s/s of bleed

## 2015-07-05 NOTE — Progress Notes (Signed)
ANTICOAGULATION CONSULT NOTE - Follow Up Consult  Pharmacy Consult for Heparin  Indication: pulmonary embolus/DVT  Allergies  Allergen Reactions  . Ace Inhibitors     cough    Patient Measurements: Height: 5\' 10"  (177.8 cm) Weight: 173 lb (78.472 kg) IBW/kg (Calculated) : 73  Vital Signs: Temp: 97.8 F (36.6 C) (10/28 1542) Temp Source: Oral (10/28 1542) BP: 99/69 mmHg (10/28 1542) Pulse Rate: 94 (10/28 1141)  Labs:  Recent Labs  07/02/15 2050 07/03/15 0420  07/04/15 0425 07/04/15 1150 07/05/15 0500 07/05/15 1820  HGB 10.2* 10.5*  --  9.8*  --  9.9*  --   HCT 31.3* 31.7*  --  29.8*  --  30.9*  --   PLT 423* 501*  --  352  --  354  --   APTT 51*  --   --   --   --   --   --   LABPROT 19.3*  --   --   --   --   --   --   INR 1.62*  --   --   --   --   --   --   HEPARINUNFRC  --  0.30  < > 0.33 0.43 0.54 0.23*  CREATININE 1.24  --   --  0.90 0.89 0.81  --   < > = values in this interval not displayed.  Estimated Creatinine Clearance: 100.1 mL/min (by C-G formula based on Cr of 0.81).  Assessment: 59 yo M on heparin gtt for new PE/DVT. Last HL was therapeutic at 0.53 with a rate of 1850 units/hr- heparin was held this morning for EGD and was restarted at the same rate. Heparin level now dropped to 0.23 units/mL- no issues with infusion after restart per RN. No bleeding noted.  Goal of Therapy:  Heparin level 0.3-0.7 units/ml Monitor platelets by anticoagulation protocol: Yes   Plan:  -increase heparin to 1950 units/hr -Check 6 hr HL at 0200 -Monitor daily HL, CBC, s/s of bleed  Eureka Valdes D. Kellon Chalk, PharmD, BCPS Clinical Pharmacist Pager: [phone] 07/05/2015 7:09 PM

## 2015-07-05 NOTE — Anesthesia Postprocedure Evaluation (Signed)
  Anesthesia Post-op Note  Patient: Joel Miller  Procedure(s) Performed: Procedure(s): ESOPHAGOGASTRODUODENOSCOPY (EGD) (N/A)  Patient Location: PACU and Endoscopy Unit  Anesthesia Type:MAC  Level of Consciousness: awake  Airway and Oxygen Therapy: Patient Spontanous Breathing  Post-op Pain: mild  Post-op Assessment: Post-op Vital signs reviewed              Post-op Vital Signs: Reviewed  Last Vitals:  Filed Vitals:   07/05/15 1110  BP: 102/58  Pulse: 91  Temp:   Resp: 26    Complications: No apparent anesthesia complications

## 2015-07-05 NOTE — Transfer of Care (Signed)
Immediate Anesthesia Transfer of Care Note  Patient: Joel Miller  Procedure(s) Performed: Procedure(s): ESOPHAGOGASTRODUODENOSCOPY (EGD) (N/A)  Patient Location: Endoscopy Unit  Anesthesia Type:MAC  Level of Consciousness: awake, alert  and oriented  Airway & Oxygen Therapy: Patient Spontanous Breathing and Patient connected to nasal cannula oxygen  Post-op Assessment: Report given to RN and Post -op Vital signs reviewed and stable  Post vital signs: Reviewed and stable  Last Vitals:  Filed Vitals:   07/05/15 1045  BP: 73/39  Pulse:   Temp: 36.6 C  Resp: 27    Complications: No apparent anesthesia complications

## 2015-07-05 NOTE — Progress Notes (Signed)
Patient ID: Joel Miller, male   DOB: 10-13-1955, 59 y.o.   MRN: [id number]    Subjective:  Doing much better this am Slept well Pericardial drain out Much less dyspnic indicated both pleur X catheters also drained   Objective:  Filed Vitals:   07/04/15 2000 07/04/15 2305 07/05/15 0400 07/05/15 0450  BP:  101/67  93/70  Pulse:  95  90  Temp:  97.8 F (36.6 C)  98 F (36.7 C)  TempSrc:  Oral  Oral  Resp: 20 23 20 18   Height:      Weight:      SpO2: 100% 99% 92% 92%    Intake/Output from previous day:  Intake/Output Summary (Last 24 hours) at 07/05/15 0919 Last data filed at 07/05/15 9518  Gross per 24 hour  Intake 1784.64 ml  Output   3385 ml  Net -1600.36 ml    Physical Exam: Affect appropriate Chronically ill white male  HEENT: normal Neck supple with no adenopathy right IJ  JVP normal no bruits no thyromegaly Lungs decreased BS bases no wheezing and good diaphragmatic motion Bilateral Pleur X catheters left new  Heart:  S1/S2 no murmur, no rub, gallop or click Subxiphoid drain out  PMI normal Abdomen: benighn, BS positve, no tenderness, no AAA no bruit.  No HSM or HJR Distal pulses intact with no bruits No edema Neuro non-focal Skin warm and dry No muscular weakness   Lab Results: Basic Metabolic Panel:  Recent Labs  07/04/15 1150 07/05/15 0500  NA 132* 130*  K 3.9 3.6  CL 102 99*  CO2 21* 25  GLUCOSE 149* 120*  BUN 18 16  CREATININE 0.89 0.81  CALCIUM 8.1* 7.9*   Liver Function Tests:  Recent Labs  07/02/15 2050 07/05/15 0500  AST 167* 45*  ALT 152* 75*  ALKPHOS 107 84  BILITOT 0.8 0.6  PROT 5.8* 4.6*  ALBUMIN 2.2* 2.0*   CBC:  Recent Labs  07/04/15 0425 07/05/15 0500  WBC 8.4 7.5  HGB 9.8* 9.9*  HCT 29.8* 30.9*  MCV 89.5 89.3  PLT 352 354    Imaging: Dg Chest Port 1 View  07/05/2015  CLINICAL DATA:  Pericardial effusion EXAM: PORTABLE CHEST 1 VIEW COMPARISON:  Yesterday FINDINGS: Right jugular venous catheter stable.  Volume loss and airspace opacities at the right lung base are stable. Right PleurX catheter is stable. Left PleurX catheter is stable. IMPRESSION: Stable exam. Right basilar opacities and volume loss persists. Right pleural effusion persists. Electronically Signed   By: Marybelle Killings M.D.   On: 07/05/2015 07:59   Dg Chest Port 1 View  07/04/2015  CLINICAL DATA:  Bilateral pleural effusions EXAM: PORTABLE CHEST 1 VIEW COMPARISON:  Chest radiograph from one day prior. FINDINGS: Right internal jugular central venous catheter terminates at the cavoatrial junction. Bibasilar pleural catheters are stable in position. Stable cardiomediastinal silhouette with mild cardiomegaly. No pneumothorax. Small right and trace left pleural effusions appear stable. There is mild pulmonary edema, which appears slightly worsened. Patchy opacity at the right greater than left lung bases appear slightly worse at the right lung base. Retained oral contrast is noted in the left upper quadrant of the abdomen. IMPRESSION: 1. Stable small right and trace left pleural effusions. 2. Stable mild cardiomegaly with increased mild pulmonary edema, suggesting worsening mild congestive heart failure. 3. Patchy bibasilar lung opacities, right greater than left, increased on the right, favor atelectasis. Electronically Signed   By: Ilona Sorrel M.D.   On: 07/04/2015  08:02   Dg Chest Port 1 View  07/03/2015  CLINICAL DATA:  Status post pericardial window/strain on the right. PleurX catheter placed on the left. Bilateral pleural effusions. EXAM: PORTABLE CHEST 1 VIEW COMPARISON:  06/13/2015 FINDINGS: Stable chest tube curls in the right lower hemi thorax. There is a persistent moderate right pleural effusion obscuring the hemidiaphragm. New left sided chest tube projects at the left lung base. Left pleural fluid appears decreased from the prior study, now small in amount. No pneumothorax. Right lower lung zone opacity is similar to the priors exam  allowing for differences in patient positioning and technique. This is likely atelectasis and/or scarring. No new lung opacities. Right internal jugular central venous line is new from the prior study. Tip projects at the caval atrial junction. IMPRESSION: 1. Decreased left pleural fluid following left PleurX catheter placement. 2. Persistent right pleural fluid. No change in the right lower hemi thorax chest tube. 3. No pneumothorax. 4. New right internal jugular central venous line is well positioned with its tip at the caval atrial junction. Electronically Signed   By: Lajean Manes M.D.   On: 07/03/2015 11:14   Dg Fluoro Guide Cv Line-no Report  07/03/2015  CLINICAL DATA:  FLOURO GUIDE CV LINE Fluoroscopy was utilized by the requesting physician.  No radiographic interpretation.    Cardiac Studies:  ECG:  Rapid afib no ST/T wave changes    Telemetry: afib rates 120  Echo:  Large pericardial effusion normal EF   Medications:   . bisacodyl  10 mg Oral Daily  . feeding supplement  1 Container Oral TID BM  . fentaNYL   Intravenous 6 times per day  . insulin aspart  0-9 Units Subcutaneous TID WC  . potassium chloride  40 mEq Oral Once  . predniSONE  10 mg Oral Q breakfast  . senna-docusate  1 tablet Oral QHS  . sodium chloride  3 mL Intravenous Q12H  . sodium chloride  3 mL Intravenous Q12H     . sodium chloride    . amiodarone 30 mg/hr (07/05/15 0600)  . dextrose 5 % and 0.9% NaCl 50 mL/hr at 07/05/15 0600    Assessment/Plan:  PAF:  Post semi emergent [place] yesterday within 24 hrs of onset  Maint NSR  Can convert over to PO amiodarone 200 bid When taking PO.  Will need chronic anticoagulation for this and recent PE  Pericardial Effusion: likely malignant post window drain out path pending   Pleural Effusion: old right Pleur X cytology not diagnostic  New left pleur X draining well  Esophageal CA:  Prognosis appears poor  Breathing good enough now for EGD with Dr Carlean Purl today      Jenkins Rouge 07/05/2015, 9:19 AM

## 2015-07-05 NOTE — Op Note (Signed)
[facility] [place], [address]   ENDOSCOPY PROCEDURE REPORT  PATIENT: Joel, Miller  MR#: [id number] BIRTHDATE: 21-Jan-1956 , 59  yrs. old GENDER: male ENDOSCOPIST: Gatha Mayer, MD, [facility] PROCEDURE DATE:  07/05/2015 PROCEDURE:  EGD, diagnostic ASA CLASS:     Class IV INDICATIONS:  dysphagia and abnormal UGI series results. MEDICATIONS: Per Anesthesia TOPICAL ANESTHETIC: none  DESCRIPTION OF PROCEDURE: After the risks benefits and alternatives of the procedure were thoroughly explained, informed consent was obtained.  The Pentax Gastroscope [id number] endoscope was introduced through the mouth and advanced to the second portion of the duodenum , Without limitations.  The instrument was slowly withdrawn as the mucosa was fully examined.    Normal post-op anatomy - there were a few proximal gastric erosions but no sig stenoses, strictures or signs of malignancy.  In particular the pylorus was normal.  Retroflexion was not performed. The scope was then withdrawn from the patient and the procedure completed.  COMPLICATIONS: There were no immediate complications.  ENDOSCOPIC IMPRESSION: Normal post-op anatomy - there were a few proximal gastric erosions but no sig stenoses, strictures or signs of malignancy.  In particular the pylorus was normal  RECOMMENDATIONS: Full liquid diet will f/u there was no role for dilation today    eSigned:  Gatha Mayer, MD, [facility] 07/05/2015 11:06 AM

## 2015-07-05 NOTE — Anesthesia Preprocedure Evaluation (Addendum)
Anesthesia Evaluation  Patient identified by MRN, date of birth, ID band Patient awake    Reviewed: Allergy & Precautions, NPO status , Patient's Chart, lab work & pertinent test results  History of Anesthesia Complications (+) PONV  Airway Mallampati: II  TM Distance: >3 FB Neck ROM: Full    Dental   Pulmonary former smoker,    breath sounds clear to auscultation       Cardiovascular hypertension,  Rhythm:Regular Rate:Normal     Neuro/Psych    GI/Hepatic Neg liver ROS, GERD  ,  Endo/Other  diabetes  Renal/GU negative Renal ROS     Musculoskeletal   Abdominal   Peds  Hematology   Anesthesia Other Findings   Reproductive/Obstetrics                           Anesthesia Physical Anesthesia Plan  ASA: III  Anesthesia Plan: MAC   Post-op Pain Management:    Induction: Intravenous  Airway Management Planned: Nasal Cannula  Additional Equipment:   Intra-op Plan:   Post-operative Plan:   Informed Consent: I have reviewed the patients History and Physical, chart, labs and discussed the procedure including the risks, benefits and alternatives for the proposed anesthesia with the patient or authorized representative who has indicated his/her understanding and acceptance.   Dental advisory given  Plan Discussed with: CRNA and Anesthesiologist  Anesthesia Plan Comments:         Anesthesia Quick Evaluation

## 2015-07-05 NOTE — Progress Notes (Signed)
Drained Right pleurX for 100cc clear serosangouineous, no sediments.   Left pleurX drained 400cc clear yellow, no sediments.  mediastenal chest tube 12cc in pleuravac.

## 2015-07-05 NOTE — Progress Notes (Signed)
Discussion with patient. Plan for EGD noted.  I will check on him 07/08/2015.  Please call oncology as needed

## 2015-07-05 NOTE — Progress Notes (Addendum)
      [address]       [place] [address]             [phone]       1 Day Post-Op Procedure(s) (LRB): Bedside CARDIOVERSION (N/A)  Subjective: Patient without specific complaints this am. He hopes to go home soon.  Objective: Vital signs in last 24 hours: Temp:  [97.6 F (36.4 C)-98.4 F (36.9 C)] 98 F (36.7 C) (10/28 0450) Pulse Rate:  [90-135] 90 (10/28 0450) Cardiac Rhythm:  [-] Normal sinus rhythm (10/28 0450) Resp:  [16-30] 18 (10/28 0450) BP: (81-114)/(57-79) 93/70 mmHg (10/28 0450) SpO2:  [88 %-100 %] 92 % (10/28 0450)     Intake/Output from previous day: 10/27 0701 - 10/28 0700 In: 1988.1 [I.V.:1988.1] Out: 2873 [Urine:1395; Chest Tube:1478]   Physical Exam:  Cardiovascular: [facility] Pulmonary: Crackles bilaterally Abdomen: Soft, non tender, bowel sounds present. Extremities: Mild bilateral lower extremity edema. Wounds: Clean and dry.  No erythema or signs of infection. Pericardial Tube: to suction  Lab Results: CBC:  Recent Labs  07/04/15 0425 07/05/15 0500  WBC 8.4 7.5  HGB 9.8* 9.9*  HCT 29.8* 30.9*  PLT 352 354   BMET:   Recent Labs  07/04/15 1150 07/05/15 0500  NA 132* 130*  K 3.9 3.6  CL 102 99*  CO2 21* 25  GLUCOSE 149* 120*  BUN 18 16  CREATININE 0.89 0.81  CALCIUM 8.1* 7.9*    PT/INR:   Recent Labs  07/02/15 2050  LABPROT 19.3*  INR 1.62*   ABG:  INR: Will add last result for INR, ABG once components are confirmed Will add last 4 CBG results once components are confirmed  Assessment/Plan:  1. CV - Previous A fib with HR in the 110's. SR in the 90's this am. S/p DCCV yesterday. On Amiodarone drip. BP labile so unable to start BB. Likely stop Amiodarone drip and transition to oral. Cardiology following.  2.  Pulmonary - Pericardial tube with less than 80 cc last 24 hours . Will remove pericardial tube today. Patient with bilateral pleur x catheters. Pleur X catheters drained this am: Left  500 cc and right 100 cc. Will drain left pleur X everday and the right pleur x every Wednesday. CXR this am appears to show left pleural effusion and trace right pleural effusion, cardiomegaly, bibasilar atelectasis. 3. On Heparin drip for PE. Heparin on hold this am for GI procedure 4. Anemia-H and H stable 9.9 and 30.9 5. Hyponatremia-sodium remains 130 6. GI-history of esophageal cancer (s/p chemo,XRT, and esophagectomy). For EGD, possible balloon dilatation of pylorus. Hopefully, to be done this am. 7. Supplement potassium  ZIMMERMAN,DONIELLE MPA-C 07/05/2015,7:49 AM   After endoscopy discussed with Dr Benay Spice, He has no preference  About anticoagulation for PE, was given prescription for Lovenox before was decides to be admitted. If he has Lovenox at home could go home on lovenox and if stable for several days convert to xarelto as outpatient after seen by oncology I have seen and examined Josephine Cables and agree with the above assessment  and plan.  [name] MD Bee[facility] [phone] Office [phone] 07/05/2015 3:34 PM

## 2015-07-06 ENCOUNTER — Inpatient Hospital Stay (HOSPITAL_COMMUNITY): Payer: BLUE CROSS/BLUE SHIELD

## 2015-07-06 DIAGNOSIS — I82409 Acute embolism and thrombosis of unspecified deep veins of unspecified lower extremity: Secondary | ICD-10-CM

## 2015-07-06 DIAGNOSIS — C158 Malignant neoplasm of overlapping sites of esophagus: Secondary | ICD-10-CM

## 2015-07-06 LAB — CBC
HEMATOCRIT: 31.1 % — AB (ref 39.0–52.0)
Hemoglobin: 10.2 g/dL — ABNORMAL LOW (ref 13.0–17.0)
MCH: 29.7 pg (ref 26.0–34.0)
MCHC: 32.8 g/dL (ref 30.0–36.0)
MCV: 90.4 fL (ref 78.0–100.0)
PLATELETS: 310 10*3/uL (ref 150–400)
RBC: 3.44 MIL/uL — ABNORMAL LOW (ref 4.22–5.81)
RDW: 17.2 % — AB (ref 11.5–15.5)
WBC: 7.2 10*3/uL (ref 4.0–10.5)

## 2015-07-06 LAB — GLUCOSE, CAPILLARY
Glucose-Capillary: 115 mg/dL — ABNORMAL HIGH (ref 65–99)
Glucose-Capillary: 133 mg/dL — ABNORMAL HIGH (ref 65–99)
Glucose-Capillary: 142 mg/dL — ABNORMAL HIGH (ref 65–99)

## 2015-07-06 LAB — HEPARIN LEVEL (UNFRACTIONATED)
Heparin Unfractionated: 0.71 IU/mL — ABNORMAL HIGH (ref 0.30–0.70)
Heparin Unfractionated: 0.83 IU/mL — ABNORMAL HIGH (ref 0.30–0.70)

## 2015-07-06 MED ORDER — BOOST / RESOURCE BREEZE PO LIQD: 1.0000 | Freq: Three times a day (TID) | ORAL | Status: AC

## 2015-07-06 MED ORDER — RIVAROXABAN (XARELTO) VTE STARTER PACK (15 & 20 MG): ORAL_TABLET | ORAL | Status: DC

## 2015-07-06 MED ORDER — ENOXAPARIN SODIUM 80 MG/0.8ML ~~LOC~~ SOLN: 80.0000 mg | Freq: Two times a day (BID) | SUBCUTANEOUS | Status: DC

## 2015-07-06 MED ORDER — ENOXAPARIN SODIUM 80 MG/0.8ML ~~LOC~~ SOLN
80.0000 mg | Freq: Two times a day (BID) | SUBCUTANEOUS | Status: DC
  Administered 2015-07-06: 80 mg via SUBCUTANEOUS
  Filled 2015-07-06 (×2): qty 0.8

## 2015-07-06 MED ORDER — AMIODARONE HCL 200 MG PO TABS: 200.0000 mg | ORAL_TABLET | Freq: Two times a day (BID) | ORAL | Status: DC

## 2015-07-06 NOTE — Progress Notes (Signed)
Fentanyl PCA discontinued per MD order. Fentanyl IV 441mcg wasted in sharps, witnessed by Bailey Mech, Therapist, sports.

## 2015-07-06 NOTE — Care Management Note (Addendum)
Case Management Note  Patient Details  Name: Joel Miller MRN: [id number] Date of Birth: [birth year]  Subjective/Objective:                  worsening SOB/PE  Action/Plan: Discharge planning  Expected Discharge Date:  07/06/15               Expected Discharge Plan:  Home/Self Care  In-House Referral:     Discharge planning Services  CM Consult, Medication Assistance  Post Acute Care Choice:    Choice offered to:     DME Arranged:    DME Agency:     HH Arranged:    [place] Agency:     Status of Service:  Completed, signed off  Medicare Important Message Given:    Date Medicare IM Given:    Medicare IM give by:    Date Additional Medicare IM Given:    Additional Medicare Important Message give by:     If discussed at [place] of Stay Meetings, dates discussed:    Additional Comments: 16:40   Cm called RN to let her know the medication will be ready for pick up at [facility].  No other CM needs communicated. 15:45 CM calls [facility] and they state they have not received prescription but DO have med in stock.  CM has prescription faxed AGAIN to [facility] who confirms receipt.. 14:45 CM called [facility] and they do not have lovenox in stock and they have checked all other [facility] pharmacies in area and none in [place].  CM requested they fax to [facility] on [address]. 14:13 CM called [facility] pharmacy who did NOT receive e-scribed prescription for lovenox.  CM faxed the prescriptions to [facility] and received confirmation of receipt. CM met with pt in room to discuss pleurex drain mgmt.  CM has called and confirmed with [facility] rep, Tiffany, pt has not been with [facility] for several weeks.  Pt drains own system. Pt gets his supplies from [facility].  Pt has a box and 1/2 from [employer] to get him through until [facility] sends more supplies.  CM cannot do a benefits check (as it is the wknd and insurance co not open) but pt states he has exceeded his Medication copay limits for this calendar year and now he  understands his copays are $0.  To make sure of cost, CM has suggested RN request discharging MD to E-scribe to the pt's pharmacy ([facility] [phone])  to get exact cost prior to leaving hospital.  CM gave pt free 30 day trial card for Xarelto and copay discount card for the time Xarelto is started.  No other CM needs were communicated. Dellie Catholic, RN 07/06/2015, 12:43 PM

## 2015-07-06 NOTE — Progress Notes (Signed)
Subjective:  Feeling much better and is anxious to go home.  Maintaining sinus rhythm after cardioversion.  Objective:  Vital Signs in the last 24 hours: BP 101/67 mmHg  Pulse 89  Temp(Src) 97.8 F (36.6 C) (Oral)  Resp 17  Ht 5\' 10"  (1.778 m)  Wt 78.472 kg (173 lb)  BMI 24.82 kg/m2  SpO2 97%  Physical Exam: Pleasant male in no acute distress Lungs:  Reduced breath sounds at bases Cardiac:  Regular rhythm, normal S1 and S2, no S3 Extremities:  Trace edema  Intake/Output from previous day: 10/28 0701 - 10/29 0700 In: 2704.1 [P.O.:680; I.V.:2024.1] Out: 512 [Chest Tube:512] Weight Filed Weights   07/01/15 1801  Weight: 78.472 kg (173 lb)    Lab Results: Basic Metabolic Panel:  Recent Labs  07/04/15 1150 07/05/15 0500  NA 132* 130*  K 3.9 3.6  CL 102 99*  CO2 21* 25  GLUCOSE 149* 120*  BUN 18 16  CREATININE 0.89 0.81    CBC:  Recent Labs  07/05/15 0500 07/06/15 0323  WBC 7.5 7.2  HGB 9.9* 10.2*  HCT 30.9* 31.1*  MCV 89.3 90.4  PLT 354 310   PROTIME: Lab Results  Component Value Date   INR 1.62* 07/02/2015   INR 1.57* 07/02/2015   INR 1.30 05/24/2015    Telemetry: Normal sinus rhythm  Assessment/Plan:  1.  Atrial fibrillation-resolved after cardioversion 2.  Pericardial tamponade status post drainage doing well 3.  Anticoagulation  Recommendations:  Okay for discharge on amiodarone 200 mg twice a day.  May transition to Lovenox injections and then be discharged and once seen by oncology maybe Xarelto.     Kerry Hough  MD [facility]  07/06/2015, 10:47 AM

## 2015-07-06 NOTE — Progress Notes (Addendum)
      [address]       [place] [address]             [phone]       1 Day Post-Op Procedure(s) (LRB): ESOPHAGOGASTRODUODENOSCOPY (EGD) (N/A)  Subjective: Patient is tolerating full liquids and is hoping to go home soon.  Objective: Vital signs in last 24 hours: Temp:  [97.5 F (36.4 C)-98 F (36.7 C)] 97.8 F (36.6 C) (10/29 0755) Pulse Rate:  [89-98] 89 (10/29 0755) Cardiac Rhythm:  [-] Normal sinus rhythm (10/29 0755) Resp:  [17-29] 17 (10/29 0755) BP: (73-107)/(39-69) 101/67 mmHg (10/29 0755) SpO2:  [92 %-99 %] 97 % (10/29 0755)     Intake/Output from previous day: 10/28 0701 - 10/29 0700 In: 2704.1 [P.O.:680; I.V.:2024.1] Out: 512 [Chest Tube:512]   Physical Exam:  Cardiovascular: RRR Pulmonary: Slightly diminished left base Abdomen: Soft, non tender, bowel sounds present. Extremities: Mild bilateral lower extremity edema. Wounds: Clean and dry.  No erythema or signs of infection.   Lab Results: CBC:  Recent Labs  07/05/15 0500 07/06/15 0323  WBC 7.5 7.2  HGB 9.9* 10.2*  HCT 30.9* 31.1*  PLT 354 310   BMET:   Recent Labs  07/04/15 1150 07/05/15 0500  NA 132* 130*  K 3.9 3.6  CL 102 99*  CO2 21* 25  GLUCOSE 149* 120*  BUN 18 16  CREATININE 0.89 0.81  CALCIUM 8.1* 7.9*    PT/INR:  No results for input(s): LABPROT, INR in the last 72 hours. ABG:  INR: Will add last result for INR, ABG once components are confirmed Will add last 4 CBG results once components are confirmed  Assessment/Plan:  1. CV - Previous A fib with HR in the 110's. SR in the 90's this am. S/p DCCV. On Amiodarone 200 mg bid. Cardiology following.  2.  Pulmonary -  Patient with bilateral pleur x catheters. Will drain left pleur X everday and the right pleur x every Wednesday. Left pleur X drained 650 cc this am. CXR this am appears to show bilateral pleural effusions L>R , cardiomegaly, bibasilar atelectasis. 3. On Heparin drip for PE. Per Dr.  Everrett Coombe note yesterday, he discussed anticoagulation with Dr. Benay Spice. Appears could go home on Lovenox (patient states he needs prescription) for several days and then covert to Xarelto. 4. Anemia-H and H stable 10.2 and 31.1 5. Hyponatremia-sodium remains 130 6. GI-history of esophageal cancer (s/p chemo,XRT, and esophagectomy). S/p EGD. Normal post op anatomy-a few gastric erosions but no stenosis, stricture or signs of malignancy. Pylorus was normal. 7. Stop IVF and PCA  ZIMMERMAN,DONIELLE MPA-C 07/06/2015,9:12 AM    I have seen and examined the patient and agree with the assessment and plan as outlined.  Okay for d/c home from our standpoint  Rexene Alberts, MD 07/06/2015 12:21 PM

## 2015-07-06 NOTE — Progress Notes (Signed)
[place] for Heparin  Indication: pulmonary embolus/DVT  Allergies  Allergen Reactions  . Ace Inhibitors     cough    Patient Measurements: Height: 5\' 10"  (177.8 cm) Weight: 173 lb (78.472 kg) IBW/kg (Calculated) : 73  Vital Signs: Temp: 98 F (36.7 C) (10/29 0259) Temp Source: Oral (10/29 0259) BP: 99/68 mmHg (10/29 0259) Pulse Rate: 91 (10/29 0259)  Labs:  Recent Labs  07/04/15 0425 07/04/15 1150 07/05/15 0500 07/05/15 1820 07/06/15 0323 07/06/15 0324  HGB 9.8*  --  9.9*  --  10.2*  --   HCT 29.8*  --  30.9*  --  31.1*  --   PLT 352  --  354  --  310  --   HEPARINUNFRC 0.33 0.43 0.54 0.23*  --  0.71*  CREATININE 0.90 0.89 0.81  --   --   --     Estimated Creatinine Clearance: 100.1 mL/min (by C-G formula based on Cr of 0.81).  Assessment: 59 yo M on heparin gtt for new PE/DVT. Last HL was therapeutic at 0.53 with a rate of 1850 units/hr- heparin was held this morning for EGD and was restarted at the same rate. Heparin level slightly above range, no s/sx bleeding, given fluctuating rate will leave as is.   Goal of Therapy:  Heparin level 0.3-0.7 units/ml Monitor platelets by anticoagulation protocol: Yes   Plan:  -Continue heparin at 1950 units/hr -Check confirmatory level     Hughes Better, PharmD, BCPS Clinical Pharmacist Pager: [phone] 07/06/2015 4:18 AM

## 2015-07-06 NOTE — Progress Notes (Signed)
   Patient Name: Joel Miller Date of Encounter: 07/06/2015, 10:25 AM    Subjective  No dysphagia on full liquids Asking about why he would cough before   Objective  BP 101/67 mmHg  Pulse 89  Temp(Src) 97.8 F (36.6 C) (Oral)  Resp 17  Ht 5\' 10"  (1.778 m)  Wt 173 lb (78.472 kg)  BMI 24.82 kg/m2  SpO2 97% NAD Chronically ill    Assessment and Plan  Dysphagia - resolved - suspect at least in part due to pericardial effusion impacting stomach in mediastinum  Will advance to dysphagia 3 diet  Will f/u prn here - signing off  He has my card and is advised to call and get me a message if he has more dysphagia   Gatha Mayer, MD, [facility] [phone] (pager) 07/06/2015 10:25 AM

## 2015-07-06 NOTE — Progress Notes (Addendum)
[place] for Heparin transition to Lovenox Indication: pulmonary embolus/DVT  Allergies  Allergen Reactions  . Ace Inhibitors     cough    Patient Measurements: Height: 5\' 10"  (177.8 cm) Weight: 173 lb (78.472 kg) IBW/kg (Calculated) : 73  Heparin Dosing Weight: 78.5 kg  Vital Signs: Temp: 97.8 F (36.6 C) (10/29 0755) Temp Source: Oral (10/29 0755) BP: 101/67 mmHg (10/29 0755) Pulse Rate: 89 (10/29 0755)  Labs:  Recent Labs  07/04/15 0425 07/04/15 1150 07/05/15 0500 07/05/15 1820 07/06/15 0323 07/06/15 0324 07/06/15 0945  HGB 9.8*  --  9.9*  --  10.2*  --   --   HCT 29.8*  --  30.9*  --  31.1*  --   --   PLT 352  --  354  --  310  --   --   HEPARINUNFRC 0.33 0.43 0.54 0.23*  --  0.71* 0.83*  CREATININE 0.90 0.89 0.81  --   --   --   --     Estimated Creatinine Clearance: 100.1 mL/min (by C-G formula based on Cr of 0.81).  Assessment: 59 yo M on heparin gtt for new PE/DVT. Last HL was therapeutic at 0.53 with a rate of 1850 units/hr- heparin was held for EGD and then restarted. Heparin level 0.83, Hgb 10.2, plt wnl, no bleeding noted. Pharmacy now consulted to change therapy to Lovenox.  Goal of Therapy:  Monitor platelets by anticoagulation protocol: Yes   Plan:  Stop Heparin drip Start Lovenox 1hr after herapin drip stopped Start Lovenox 80mg  Q12 Monitor daily CBC and s/s of bleed  Darl Pikes, PharmD Clinical Pharmacist- Resident Pager: [phone] 07/06/2015 10:34 AM

## 2015-07-06 NOTE — Discharge Instructions (Signed)
Please drain LEFT pleur X every day.Starting 07/10/2015, please drain LEFT pleur X every other day. Drain RIGHT pleur x every Wednesday. Please record output and bring record to office appointment

## 2015-07-06 NOTE — Discharge Summary (Signed)
Physician Discharge Summary  Zair Borawski [facility] BHA:[id number] DOB: March 30, 1956 DOA: 07/01/2015  PCP: [name], MD  Admit date: 07/01/2015 Discharge date: 07/06/2015  Time spent: >30 minutes  Recommendations for Outpatient Follow-up:  1. Patient to use Lovenox 6 doses after discharge then we'll begin Xarelto starter pack 2. Chronic but stable hyponatremia-recommend follow-up electrolyte panel in next 2 weeks 3. Case management has confirmed home health to assist with bilateral Pleurx drain management through [facility] including need for more supplies. 4. Patient needs to follow up with cardiology post cardioversion and initiation of amiodarone and to discuss tapering amiodarone 5. 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 Patient had a Pleurx catheter placed several weeks prior with an average of 400 mL out per 24 hours. Over several days prior to presentation he noted marked diminished output from drain of 20 mL. Patient was also reporting worsening cough. Because of the symptoms he was evaluated by Dr. Benay Spice at the cancer center. He also reported issues with dysphagia. With  further questioning patient reported lower extremity edema which was worsening as well as increasing shortness of breath especially dyspnea on exertion. CT of the chest performed on 10/24 revealed focal pulmonary emboli in the proximal lingular pulmonary artery branches without larger pulmonary emboli identified and no evidence of right heart strain. Also noted bilateral pleural effusions left greater than right with pleural drainage catheter in place on the right. Also incidental finding of a sizable pericardial effusion. Patient was sent to [facility] for [facility] and was admitted by [facility] hospitalist on same date. Beause of the dysphagia patient also underwent an esophagram on 10/24 which revealed a stricture at the pylorus. Gastroenterology was consulted. Because of the pericardial effusion cardiothoracic surgery was also consulted. A 2-D echocardiogram completed on 10/25 will a large, free flowing pericardial effusion circumferential to the heart without tamponade physiology. Patient was subsequently transferred to [facility] at the request of thoracic surgery.  On 10/26 patient underwent a subxiphoid pericardial window with drainage of pericardial effusion as well as placement of the left Pleurx catheter for drainage of left pleural effusion under ultrasound fluoroscopic guidance. Patient required ICU evaluation anemia postop. But with subsequent only transferred out to the stepdown unit. In the postoperative period patient did have paroxysmal atrial fibrillation and cardiology was consulted and patient was started on amiodarone after undergoing cardioversion on 10/27. On 10/27 bilateral lower extremity duplex was completed and showed no evidence of DVT. He underwent an EGD on 10/28 which revealed normal postop anatomy with a few proximal gastric erosions but no significant stenoses, strictures or signs of malignancy. Subsequently he has tolerated a full liquid diet with nutrition following regarding  appropriate protein intake.  Hospital Course:  Principal Problem:  Acute pulmonary embolism and presumed DVT ([place]) -Transition from heparin to Lovenox 1 mg/kg every 12 hours on 10/29 -Per CVTS note: Dr. Servando Snare discuss anticoagulation with oncology Dr. Benay Spice. Recommendations were to discharge on Lovenox for "several days" (we have ordered for 3 days or 6 doses) and convert to Xarelto in the OP setting-patient given prescription for Xarelto starter pack which includes 15 mg twice a day 21 days then end point dose 20 mg daily -On 10/29 case management was consulted to confirm co-pay for Lovenox as well as Xarelto-Electronic prescription sent to patient's pharmacy [facility] Oppenheim at [facility])-if patient can afford co-pay plan to discharge on 10/29 -Currently stable on room air  Active Problems:  Pericardial effusion/Bilateral pleural effusion -Status post pericardial window and placement of Pleurx tube on left side -Presented with Pleurx tube on right side-patient reports he and his wife are comfortable with draining this tube -CVTS has notified case management of need for [facility] RN to assist with OP bilateral Pleurx tube management-per CVTS recommendations patient is to drain the left Pleurx tube every day and starting on 11/2 he is to drain the left Pleurx tube every other day. He is to drain the right Pleurx tube every Wednesday and has been instructed to record output and bring that record to CVTS office follow-up appointment   Carcinoma of distal third of esophagus ([place])  -Follow-up with Dr. Monica Becton navigator reviewed and instructions previously entered for patient to call for appointment in 1 week   Dysphagia/Protein-calorie malnutrition, severe ([place]) -Appreciate gastroenterology assistance -Currently tolerating dysphagia 3 diet - GI has signed off -Has been instructed by GI to call if develops more dysphagia-gastroenterologist gave patient his business card for future  contact -Nutrition has been following and recommended boost breeze 3 times a day to provide 250 kcal and 9 g of protein-patient verbalizes understanding of diet education-patient will use over-the-counter Boost for supplementation   PAF (paroxysmal atrial fibrillation) ([place]) -Status post cardioversion on 10/27 and is maintaining sinus rhythm -Appreciate cardiology assistance; recommendation is to continue amiodarone 200 mg twice a day-have recommended patient notify cardiology to schedule outpatient appointment for routine follow-up post cardioversion as well as to follow amiodarone titration -CHADVASc =2 (PMH: HTN and DM off meds > 60 days per OP EMR) -Cards recommends anticoagulation   Hyponatremia -Most recent sodium 130 on 10/28 with average between 128 and 132 -Recommend follow-up electrolytes panel within 2 weeks   Anemia of chronic disease -Hemoglobin stable at 10.2 -Etiology likely combination of anemia of malignancy as well as anemia of malnutrition  Procedures: 10/27: EGD 10/27: DCCV 10/26: Pericardial window and placement of left Pleurx catheter  Consultations: [name]-CVTS [facility]-Gastroenterology (signed off) Nish[facility]-Cardiology  Discharge Exam: Filed Vitals:   07/06/15 1501  BP: 89/58  Pulse: 97  Temp: 98.6 F (37 C)  Resp: 24   Gen: No acute respiratory distress-sitting up in chair Chest: Clear to auscultation bilaterally without wheezes, rhonchi or crackles, room air-bilateral Pleurx tubes Cardiac: Regular rate and sinus rhythm, S1-S2, no rubs murmurs or gallops, 1+ bilateral peripheral lower extremity edema-primarily involving mid calf to ankle and left greater than right, no JVD Abdomen: Soft nontender nondistended without obvious hepatosplenomegaly, no ascites Extremities: Symmetrical in appearance without cyanosis, clubbing or effusion   Discharge Instructions   Discharge Instructions    Call MD for:  extreme fatigue    Complete by:  As directed       Call MD for:  persistant nausea and  vomiting    Complete by:  As directed      Call MD for:  redness, tenderness, or signs of infection (pain, swelling, redness, odor or green/yellow discharge around incision site)    Complete by:  As directed      Call MD for:  severe uncontrolled pain    Complete by:  As directed      Call MD for:  temperature >100.4    Complete by:  As directed      Diet general    Complete by:  As directed   Dysphagia 3 (FULL LIQUID) with protein supplements such as Boost or Ensure three times per day     Discharge instructions    Complete by:  As directed   Call Dr. Carlean Purl if you have problems with swallowing Call Dr. Servando Snare if you have problems with your PleurX tubes Call Dr. Johnsie Cancel if you have palpitations or chest pain     Discharge wound care:    Complete by:  As directed   Please see thoracic surgery orders for [facility] PleurX tube care and drainage     Increase activity slowly    Complete by:  As directed           Current Discharge Medication List    START taking these medications   Details  amiodarone (PACERONE) 200 MG tablet Take 1 tablet (200 mg total) by mouth 2 (two) times daily. Qty: 60 tablet, Refills: 1    enoxaparin (LOVENOX) 80 MG/0.8ML injection Inject 0.8 mLs (80 mg total) into the skin every 12 (twelve) hours. Qty: 6 Syringe, Refills: 0    feeding supplement (BOOST / RESOURCE BREEZE) LIQD Take 1 Container by mouth 3 (three) times daily between meals. Refills: 0    Rivaroxaban (XARELTO STARTER PACK) 15 & 20 MG TBPK Take as directed on package: Start with one 15mg  tablet by mouth twice a day with food. On Day 22, switch to one 20mg  tablet once a day with food. Start 12 hours after last dose of Lovenox injected Qty: 51 each, Refills: 0      CONTINUE these medications which have NOT CHANGED   Details  oxazepam (SERAX) 15 MG capsule TAKE 1 TO 2 CAPSULES BY MOUTH 1 HOUR PRIOR TO BEDTIME FOR SLEEP IF NEEDED. *STOP TAKING ZOLPIDEM* Qty:  60 capsule, Refills: 2    oxyCODONE-acetaminophen (PERCOCET/ROXICET) 5-325 MG tablet Take 1 tablet by mouth every 4 (four) hours as needed for severe pain. 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    predniSONE (DELTASONE) 10 MG tablet Take 1 tablet (10 mg total) by mouth daily with breakfast. 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 Ace  Inhibitors     cough   Follow-up Information    Follow up with [name], MD On 07/22/2015.   Specialty:  Cardiothoracic Surgery   Why:  PA/LAT CXR to be taken (at [facility] which is in the same building as Dr. Everrett Coombe office) on 07/22/2015 at 1:45 pm;Appointment time is at 2:30 pm   Contact information:   [place] [address] [phone]       Follow up with [facility] .   Why:  Please drain LEFT pleur X every day.Starting 07/10/2015, please drain LEFT pleur X every other day. Drain RIGHT pleur x every Wednesday. Please record output and bring record to office appointment      Follow up with Betsy Coder, MD.   Specialty:  Oncology   Why:  Call for appointment for one week   Contact information:   [place] [address] [phone]       Follow up with Jenkins Rouge, MD. Schedule an  appointment as soon as possible for a visit in 3 weeks.   Specialty:  Cardiology   Contact information:   [address] [place] [address] [phone]       Call Silvano Rusk, MD.   Specialty:  Gastroenterology   Why:  As needed-if dysphagia symptoms return   Contact information:   [address] [place] [address] [phone]        The results of significant diagnostics from this hospitalization (including imaging, microbiology, ancillary and laboratory) are listed below for reference.    Significant Diagnostic Studies: Dg Chest 2 View  06/13/2015  CLINICAL DATA:  History of esophageal malignancy with surgery in January of 2016, history of pleural effusions, currently asymptomatic. EXAM: CHEST  2 VIEW COMPARISON:  Portable chest x-ray of May 24, 2015 FINDINGS: There remains a smaller moderate-sized right pleural effusion. The small caliber right chest tube is unchanged in position at the inferior aspect of the pleural space. A small left pleural effusion has increased in size since September. There is no pneumothorax. The pulmonary interstitial markings are slightly more conspicuous today especially on the left. The heart is normal in size. The pulmonary vascularity is not engorged. The bony thorax exhibits no acute abnormality. IMPRESSION: 1. Mildly increased pulmonary interstitial markings especially on the left since the previous study. This may reflect interstitial edema or early interstitial pneumonia. 2. Stable small a moderate sized right pleural effusion with stable small caliber chest tube. Slight interval increase in the volume of the small left pleural effusion. Electronically Signed   By: David  Martinique M.D.   On: 06/13/2015 14:22   Ct Angio Chest Pe W/cm &/or Wo Cm  07/01/2015  ADDENDUM REPORT: 07/01/2015 15:55 ADDENDUM: The left pleural effusion is larger than the right pleural effusion. The statement in the text is error; the statement in  the impression is correct. Electronically Signed   By: Lowella Grip III M.D.   On: 07/01/2015 15:55  07/01/2015  CLINICAL DATA:  Shortness of Breath.  Esophageal carcinoma EXAM: CT ANGIOGRAPHY CHEST WITH CONTRAST TECHNIQUE: Multidetector CT imaging of the chest was performed using the standard protocol during bolus administration of intravenous contrast. Multiplanar CT image reconstructions and MIPs were obtained to evaluate the vascular anatomy. CONTRAST:  176mL OMNIPAQUE IOHEXOL 350 MG/ML SOLN COMPARISON:  Chest CT May 14, 2015; chest radiograph June 13, 2015 FINDINGS: There are focal pulmonary emboli in the proximal lingular pulmonary artery  branches causing partial obstruction. No other focal pulmonary emboli are appreciable. No more central pulmonary embolus seen. No evidence of right heart strain. There is no thoracic aortic aneurysm or dissection. Visualized great vessels appear unremarkable. There are bilateral pleural effusions, larger on the right than on the left. There is a chest tube at the right base with loculation of fluid on the right. There is patchy atelectasis in both lower lobes, more on the right than on the left. There is distention of the upper esophagus with an abrupt narrowing of the esophagus at the level of the carina. There is soft tissue fullness in this area, likely due to mass in the region of the esophagus at and below the carina. Thyroid appears unremarkable. There are multiple mildly prominent mediastinal lymph nodes, largest measuring 2.0 x 1.7 cm in the aortopulmonary window region. There is a sizable pericardial effusion. In the visualized upper abdomen, there are stable adrenal masses bilaterally. The larger masses on the left measuring 2.4 x 1.3 cm. There is hepatic steatosis. There is degenerative change in the thoracic spine. No well-defined sclerotic or lytic bone lesions are identified. Review of the MIP images confirms the above findings. IMPRESSION: There  is a focal pulmonary emboli in the proximal lingular pulmonary artery branches. No larger pulmonary emboli identified. No right heart strain. Bilateral pleural effusions, larger on the left than on the right. Areas of patchy atelectasis in both lung bases. Pleural drainage catheter present on the right. Sizable pericardial effusion present. Several small aortopulmonary window lymph nodes present. Mass in the esophagus at the level of the carina. Hepatic steatosis. Stable adrenal masses bilaterally. Critical Value/emergent results were called by telephone at the time of interpretation on 07/01/2015 at 2:42 pm to [facility], NP , who verbally acknowledged these results. Electronically Signed: By: Lowella Grip III M.D. On: 07/01/2015 14:42   Dg Esophagus  07/01/2015  CLINICAL DATA:  History of esophageal cancer status post total esophagectomy. Additional surgical history of jejunostomy and pyloroplasty. EXAM: ESOPHOGRAM/BARIUM SWALLOW TECHNIQUE: Single contrast examination was performed using  thin barium. FLUOROSCOPY TIME:  If the device does not provide the exposure index: Fluoroscopy Time:  1 minutes 43 seconds Number of Acquired Images:  28 COMPARISON:  Chest CT from earlier same day. FINDINGS: Initial swallows show evidence of patient's esophagectomy and gastric pull-up. Contrast moved promptly through the gastric pull-up to the level of the diaphragm. No evidence of intrinsic or extrinsic wall irregularities within the upper or mid portions of the gastric pull-up. A persistent narrowing was then identified in the region of the gastric pylorus. When contrast reached this area, patient did have coughing fits and slight regurgitation. Eventually, contrast did slowly pass from the slightly distended gastric pull-up to the proximal small bowel. No extraluminal contrast material seen. IMPRESSION: Persistent high grade narrowing in the region of the gastric pylorus. The smooth narrowing is more likely a  benign stricture caused by thickening related to the previous pyloroplasty than due to a neoplastic cause. Appearance also is not suggestive of extrinsic narrowing. Electronically Signed   By: Franki Cabot M.D.   On: 07/01/2015 16:14   Dg Chest Port 1 View  07/06/2015  CLINICAL DATA:  Bilateral pleural effusions.  Subsequent encounter. EXAM: PORTABLE CHEST 1 VIEW COMPARISON:  Radiograph 07/05/2015 FINDINGS: RIGHT central venous line unchanged. Stable cardiac silhouette. There is bilateral pleural effusions RIGHT greater than LEFT unchanged from prior. Airspace opacity in the RIGHT lower lobe is unchanged. Bilateral chest tubes at the  bases. No pneumothorax. IMPRESSION: No change in bilateral pleural effusions and RIGHT basilar opacity with bilateral chest tubes in place. Electronically Signed   By: Suzy Bouchard M.D.   On: 07/06/2015 09:47   Dg Chest Port 1 View  07/05/2015  CLINICAL DATA:  Pericardial effusion EXAM: PORTABLE CHEST 1 VIEW COMPARISON:  Yesterday FINDINGS: Right jugular venous catheter stable. Volume loss and airspace opacities at the right lung base are stable. Right PleurX catheter is stable. Left PleurX catheter is stable. IMPRESSION: Stable exam. Right basilar opacities and volume loss persists. Right pleural effusion persists. Electronically Signed   By: Marybelle Killings M.D.   On: 07/05/2015 07:59   Dg Chest Port 1 View  07/04/2015  CLINICAL DATA:  Bilateral pleural effusions EXAM: PORTABLE CHEST 1 VIEW COMPARISON:  Chest radiograph from one day prior. FINDINGS: Right internal jugular central venous catheter terminates at the cavoatrial junction. Bibasilar pleural catheters are stable in position. Stable cardiomediastinal silhouette with mild cardiomegaly. No pneumothorax. Small right and trace left pleural effusions appear stable. There is mild pulmonary edema, which appears slightly worsened. Patchy opacity at the right greater than left lung bases appear slightly worse at the  right lung base. Retained oral contrast is noted in the left upper quadrant of the abdomen. IMPRESSION: 1. Stable small right and trace left pleural effusions. 2. Stable mild cardiomegaly with increased mild pulmonary edema, suggesting worsening mild congestive heart failure. 3. Patchy bibasilar lung opacities, right greater than left, increased on the right, favor atelectasis. Electronically Signed   By: Ilona Sorrel M.D.   On: 07/04/2015 08:02   Dg Chest Port 1 View  07/03/2015  CLINICAL DATA:  Status post pericardial window/strain on the right. PleurX catheter placed on the left. Bilateral pleural effusions. EXAM: PORTABLE CHEST 1 VIEW COMPARISON:  06/13/2015 FINDINGS: Stable chest tube curls in the right lower hemi thorax. There is a persistent moderate right pleural effusion obscuring the hemidiaphragm. New left sided chest tube projects at the left lung base. Left pleural fluid appears decreased from the prior study, now small in amount. No pneumothorax. Right lower lung zone opacity is similar to the priors exam allowing for differences in patient positioning and technique. This is likely atelectasis and/or scarring. No new lung opacities. Right internal jugular central venous line is new from the prior study. Tip projects at the caval atrial junction. IMPRESSION: 1. Decreased left pleural fluid following left PleurX catheter placement. 2. Persistent right pleural fluid. No change in the right lower hemi thorax chest tube. 3. No pneumothorax. 4. New right internal jugular central venous line is well positioned with its tip at the caval atrial junction. Electronically Signed   By: Lajean Manes M.D.   On: 07/03/2015 11:14   Dg Fluoro Guide Cv Line-no Report  07/03/2015  CLINICAL DATA:  FLOURO GUIDE CV LINE Fluoroscopy was utilized by the requesting physician.  No radiographic interpretation.    Microbiology: Recent Results (from the past 240 hour(s))  MRSA PCR Screening     Status: None    Collection Time: 07/02/15  6:13 AM  Result Value Ref Range Status   MRSA by PCR NEGATIVE NEGATIVE Final    Comment:        The GeneXpert MRSA Assay (FDA approved for NASAL specimens only), is one component of a comprehensive MRSA colonization surveillance program. It is not intended to diagnose MRSA infection nor to guide or monitor treatment for MRSA infections.   Surgical pcr screen     Status: None  Collection Time: 07/02/15  8:54 PM  Result Value Ref Range Status   MRSA, PCR NEGATIVE NEGATIVE Final   Staphylococcus aureus NEGATIVE NEGATIVE Final    Comment:        The Xpert SA Assay (FDA approved for NASAL specimens in patients over 71 years of age), is one component of a comprehensive surveillance program.  Test performance has been validated by [facility] for patients greater than or equal to 12 year old. It is not intended to diagnose infection nor to guide or monitor treatment.   Fungus Culture with Smear     Status: None (Preliminary result)   Collection Time: 07/03/15  9:40 AM  Result Value Ref Range Status   Specimen Description FLUID PERICARDIAL  Final   Special Requests [place] PT ON ZINCEF  Final   Fungal Smear   Final    NO YEAST OR FUNGAL ELEMENTS SEEN Performed at [facility]    Culture   Final    CULTURE IN PROGRESS FOR FOUR WEEKS Performed at [facility]    Report Status PENDING  Incomplete  Culture, body fluid-bottle     Status: None (Preliminary result)   Collection Time: 07/03/15  9:40 AM  Result Value Ref Range Status   Specimen Description FLUID PERICARDIAL  Final   Special Requests NONE  Final   Culture NO GROWTH 3 DAYS  Final   Report Status PENDING  Incomplete  Gram stain     Status: None   Collection Time: 07/03/15  9:40 AM  Result Value Ref Range Status   Specimen Description FLUID PERICARDIAL  Final   Special Requests NONE  Final   Gram Stain   Final    ABUNDANT WBC PRESENT,BOTH PMN AND MONONUCLEAR NO  ORGANISMS SEEN    Report Status 07/03/2015 FINAL  Final  AFB culture with smear     Status: None (Preliminary result)   Collection Time: 07/03/15  9:40 AM  Result Value Ref Range Status   Specimen Description FLUID PERICARDIAL  Final   Special Requests NONE  Final   Acid Fast Smear   Final    NO ACID FAST BACILLI SEEN Performed at [facility]    Culture   Final    CULTURE WILL BE EXAMINED FOR 6 WEEKS BEFORE ISSUING A FINAL REPORT Performed at [facility]    Report Status PENDING  Incomplete  Fungus Culture with Smear     Status: None (Preliminary result)   Collection Time: 07/04/15  6:00 PM  Result Value Ref Range Status   Specimen Description PLEURAL LEFT FLUID  Final   Special Requests NONE  Final   Fungal Smear   Final    NO YEAST OR FUNGAL ELEMENTS SEEN Performed at [facility]    Culture   Final    CULTURE IN PROGRESS FOR FOUR WEEKS Performed at [facility]    Report Status PENDING  Incomplete     Labs: Basic Metabolic Panel:  Recent Labs Lab 07/02/15 0336 07/02/15 2050 07/04/15 0425 07/04/15 1150 07/05/15 0500  NA 130* 128* 129* 132* 130*  K 5.1 5.6* 4.3 3.9 3.6  CL 97* 95* 99* 102 99*  CO2 24 22 21* 21* 25  GLUCOSE 146* 130* 150* 149* 120*  BUN 22* 25* 23* 18 16  CREATININE 1.04 1.24 0.90 0.89 0.81  CALCIUM 8.8* 8.9 8.1* 8.1* 7.9*   Liver Function Tests:  Recent Labs Lab 07/01/15 1028 07/02/15 1630 07/02/15 2050 07/05/15 0500  AST 29  --  167* 45*  ALT 22  --  152* 75*  ALKPHOS 130  --  107 84  BILITOT 0.51  --  0.8 0.6  PROT 6.4 5.9* 5.8* 4.6*  ALBUMIN 2.4* 2.3* 2.2* 2.0*   No results for input(s): LIPASE, AMYLASE in the last 168 hours. No results for input(s): AMMONIA in the last 168 hours. CBC:  Recent Labs Lab 07/01/15 1028  07/02/15 2050 07/03/15 0420 07/04/15 0425 07/05/15 0500 07/06/15 0323  WBC 7.3  < > 7.9 8.7 8.4 7.5 7.2  NEUTROABS 6.5  --   --   --   --   --   --   HGB 10.8*  < >  10.2* 10.5* 9.8* 9.9* 10.2*  HCT 32.2*  < > 31.3* 31.7* 29.8* 30.9* 31.1*  MCV 89.3  < > 89.2 89.8 89.5 89.3 90.4  PLT 503*  < > 423* 501* 352 354 310  < > = values in this interval not displayed. Cardiac Enzymes: No results for input(s): CKTOTAL, CKMB, CKMBINDEX, TROPONINI in the last 168 hours. BNP: BNP (last 3 results) No results for input(s): BNP in the last 8760 hours.  ProBNP (last 3 results)  Recent Labs  05/10/15 1718  PROBNP 29.0    CBG:  Recent Labs Lab 07/05/15 1712 07/05/15 2134 07/06/15 0805 07/06/15 1119 07/06/15 1500  GLUCAP 122* 139* 115* 133* 142*       Signed:  Bassel Gaskill L. ANP-BC [facility] 07/06/2015, 5:09 PM

## 2015-07-06 NOTE — Progress Notes (Signed)
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 Patient had a Pleurx catheter placed several weeks prior with an average of 400 mL out per 24 hours. Over several days prior to presentation he noted marked diminished output from drain of 20 mL. Patient was also reporting worsening cough. Because of the symptoms he was evaluated by Dr. Benay Spice at the cancer center. He also reported issues with dysphagia. With further questioning patient reported lower extremity edema which was worsening as well as increasing shortness of breath especially dyspnea on exertion. CT of the chest performed on 10/24 revealed focal pulmonary emboli in the proximal lingular pulmonary artery branches without larger pulmonary emboli identified and no evidence of right heart strain. Also noted bilateral pleural effusions left greater than right with pleural drainage catheter in place on the right. Also incidental finding of a sizable pericardial effusion. Patient was sent to [facility] for admission and was admitted by [facility] on same date. Beause of the dysphagia patient also underwent an esophagram on 10/24 which revealed a stricture at the pylorus. Gastroenterology was consulted. Because of the pericardial effusion cardiothoracic surgery was also consulted. A 2-D echocardiogram completed on 10/25 will a large, free flowing pericardial effusion circumferential to the heart without tamponade physiology. Patient was subsequently transferred to [facility] at the request of thoracic surgery.  On 10/26 patient underwent a subxiphoid pericardial window with drainage of pericardial effusion as well as placement of the  left Pleurx catheter for drainage of left pleural effusion under ultrasound fluoroscopic guidance. Patient required ICU evaluation anemia postop. But with subsequent only transferred out to the stepdown unit. In the postoperative period patient did have paroxysmal atrial fibrillation and cardiology was consulted and patient was started on amiodarone after undergoing cardioversion on 10/27. On 10/27 bilateral lower extremity duplex was completed and showed no evidence of DVT. He underwent an EGD on 10/28 which revealed normal postop anatomy with a few proximal gastric erosions but no significant stenoses, strictures or signs of malignancy. Subsequently he has tolerated a full liquid diet with nutrition following regarding appropriate protein intake.  HPI/Subjective: Currently denies pain. Reports eager to go home. Requests to speak personally with the case manager regarding home health orders/clarification of co-pay for anticoagulation  Assessment/Plan: Principal Problem:   Acute pulmonary embolism and presumed DVT (HCC) -Continue Lovenox 1 mg/kg every 12 hours  -Per CVTS note: Dr. Servando Snare discuss anticoagulation with oncology Dr. Benay Spice. Recommendations were to discharge on Lovenox for "several days" (?? 3 days) and convert to Xarelto in the OP setting -On 10/29 case management has been consulted to confirm co-pay for Lovenox as well as Xarelto -CT Angio of the Chest at admission revealed focal pulmonary emboli but no evidence of DVT on lower extremity duplex this admission -Currently stable on room air  Active Problems:   Pericardial effusion/Bilateral pleural effusion -Status post pericardial window and placement of Pleurx tube on left side -Presented with Pleurx tube on right side-patient reports he and his wife are comfortable with draining this tube -CVTS has notified case management of bilateral Pleurx tube management ie schedule for  draining    Carcinoma of distal third of esophagus  ([place])  -Follow-up with Dr.  Sherrill-discharge navigator reviewed and instructions already entered for patient to call for appointment in 1 week    Dysphagia/Protein-calorie malnutrition, severe ([place]) -Appreciate gastroenterology assistance -Currently tolerating dysphagia 3 diet - GI has signed off -Has been instructed by GI to call if develops more dysphagia-gastroenterologist gave patient his business card for future contact -Nutrition has been following and recommended boost breeze 3 times a day to provide 250 kcal and 9 g of protein-patient verbalizes understanding of diet education    PAF (paroxysmal atrial fibrillation) ([place]) -Status post cardioversion on 10/27 and is maintaining sinus rhythm -Appreciate cardiology assistance; recommendation is to continue amiodarone 200 mg twice a day    Hyponatremia -Most recent sodium 130 on 10/28 with average between 128 and 132 -Recommend follow-up when necessary    Anemia of chronic disease -Hemoglobin stable at 10.2 -Etiology likely combination of anemia of malignancy as well as anemia of malnutrition   DVT prophylaxis: Full dose Lovenox Code Status: Full Family Communication: Patient only-no family at bedside Disposition Plan/Expected LOS: Anticipate discharge in next 24-48 hours pending patient affordability of recommended Lovenox and Xarelto   Consultants: [name]-CVTS Gessner-Gastroenterology (signed off) Nishan-Cardiology  Procedures: 10/27: EGD 10/27: DCCV 10/26: Pericardial window and placement of left Pleurx catheter  Cultures: Pericardial fluid without any bacterial growth, no acid-fast bacilli, no yeast or fungus  Antibiotics: Cefuroxime  2 doses (10/26 and 10/27)  Objective: Blood pressure 101/64, pulse 92, temperature 98.1 F (36.7 C), temperature source Oral, resp. rate 26, height 5\' 10"  (1.778 m), weight 173 lb (78.472 kg), SpO2 98 %.  Intake/Output Summary (Last 24 hours) at 07/06/15 1212 Last data filed  at 07/06/15 0900  Gross per 24 hour  Intake 2325.43 ml  Output    650 ml  Net 1675.43 ml     Exam: Gen: No acute respiratory distress-sitting up in chair Chest: Clear to auscultation bilaterally without wheezes, rhonchi or crackles, room air-bilateral Pleurx tubes Cardiac: Regular rate and sinus rhythm, S1-S2, no rubs murmurs or gallops, 1+ bilateral peripheral lower extremity edema-primarily involving mid calf to ankle and left greater than right, no JVD Abdomen: Soft nontender nondistended without obvious hepatosplenomegaly, no ascites Extremities: Symmetrical in appearance without cyanosis, clubbing or effusion  Scheduled Meds:  Scheduled Meds: . amiodarone  200 mg Oral BID  . bisacodyl  10 mg Oral Daily  . enoxaparin (LOVENOX) injection  80 mg Subcutaneous Q12H  . feeding supplement  1 Container Oral TID BM  . insulin aspart  0-9 Units Subcutaneous TID WC  . predniSONE  10 mg Oral Q breakfast  . senna-docusate  1 tablet Oral QHS  . sodium chloride  3 mL Intravenous Q12H  . sodium chloride  3 mL Intravenous Q12H   Continuous Infusions:   Data Reviewed: Basic Metabolic Panel:  Recent Labs Lab 07/02/15 0336 07/02/15 2050 07/04/15 0425 07/04/15 1150 07/05/15 0500  NA 130* 128* 129* 132* 130*  K 5.1 5.6* 4.3 3.9 3.6  CL 97* 95* 99* 102 99*  CO2 24 22 21* 21* 25  GLUCOSE 146* 130* 150* 149* 120*  BUN 22* 25* 23* 18 16  CREATININE 1.04 1.24 0.90 0.89 0.81  CALCIUM 8.8* 8.9 8.1* 8.1* 7.9*   Liver Function Tests:  Recent Labs Lab 07/01/15 1028 07/02/15 1630 07/02/15 2050 07/05/15 0500  AST 29  --  167* 45*  ALT 22  --  152* 75*  ALKPHOS 130  --  107 84  BILITOT 0.51  --  0.8 0.6  PROT 6.4 5.9* 5.8*  4.6*  ALBUMIN 2.4* 2.3* 2.2* 2.0*   No results for input(s): LIPASE, AMYLASE in the last 168 hours. No results for input(s): AMMONIA in the last 168 hours. CBC:  Recent Labs Lab 07/01/15 1028  07/02/15 2050 07/03/15 0420 07/04/15 0425 07/05/15 0500  07/06/15 0323  WBC 7.3  < > 7.9 8.7 8.4 7.5 7.2  NEUTROABS 6.5  --   --   --   --   --   --   HGB 10.8*  < > 10.2* 10.5* 9.8* 9.9* 10.2*  HCT 32.2*  < > 31.3* 31.7* 29.8* 30.9* 31.1*  MCV 89.3  < > 89.2 89.8 89.5 89.3 90.4  PLT 503*  < > 423* 501* 352 354 310  < > = values in this interval not displayed. Cardiac Enzymes: No results for input(s): CKTOTAL, CKMB, CKMBINDEX, TROPONINI in the last 168 hours. BNP (last 3 results) No results for input(s): BNP in the last 8760 hours.  ProBNP (last 3 results)  Recent Labs  05/10/15 1718  PROBNP 29.0    CBG:  Recent Labs Lab 07/05/15 1143 07/05/15 1712 07/05/15 2134 07/06/15 0805 07/06/15 1119  GLUCAP 146* 122* 139* 115* 133*    Recent Results (from the past 240 hour(s))  MRSA PCR Screening     Status: None   Collection Time: 07/02/15  6:13 AM  Result Value Ref Range Status   MRSA by PCR NEGATIVE NEGATIVE Final    Comment:        The GeneXpert MRSA Assay (FDA approved for NASAL specimens only), is one component of a comprehensive MRSA colonization surveillance program. It is not intended to diagnose MRSA infection nor to guide or monitor treatment for MRSA infections.   Surgical pcr screen     Status: None   Collection Time: 07/02/15  8:54 PM  Result Value Ref Range Status   MRSA, PCR NEGATIVE NEGATIVE Final   Staphylococcus aureus NEGATIVE NEGATIVE Final    Comment:        The Xpert SA Assay (FDA approved for NASAL specimens in patients over 77 years of age), is one component of a comprehensive surveillance program.  Test performance has been validated by [facility] for patients greater than or equal to 63 year old. It is not intended to diagnose infection nor to guide or monitor treatment.   Fungus Culture with Smear     Status: None (Preliminary result)   Collection Time: 07/03/15  9:40 AM  Result Value Ref Range Status   Specimen Description FLUID PERICARDIAL  Final   Special Requests SPEC C PT ON  ZINCEF  Final   Fungal Smear   Final    NO YEAST OR FUNGAL ELEMENTS SEEN Performed at [facility]    Culture   Final    CULTURE IN PROGRESS FOR FOUR WEEKS Performed at [facility]    Report Status PENDING  Incomplete  Culture, body fluid-bottle     Status: None (Preliminary result)   Collection Time: 07/03/15  9:40 AM  Result Value Ref Range Status   Specimen Description FLUID PERICARDIAL  Final   Special Requests NONE  Final   Culture NO GROWTH 2 DAYS  Final   Report Status PENDING  Incomplete  Gram stain     Status: None   Collection Time: 07/03/15  9:40 AM  Result Value Ref Range Status   Specimen Description FLUID PERICARDIAL  Final   Special Requests NONE  Final   Gram Stain   Final  ABUNDANT WBC PRESENT,BOTH PMN AND MONONUCLEAR NO ORGANISMS SEEN    Report Status 07/03/2015 FINAL  Final  AFB culture with smear     Status: None (Preliminary result)   Collection Time: 07/03/15  9:40 AM  Result Value Ref Range Status   Specimen Description FLUID PERICARDIAL  Final   Special Requests NONE  Final   Acid Fast Smear   Final    NO ACID FAST BACILLI SEEN Performed at [facility]    Culture   Final    CULTURE WILL BE EXAMINED FOR 6 WEEKS BEFORE ISSUING A FINAL REPORT Performed at [facility]    Report Status PENDING  Incomplete  Fungus Culture with Smear     Status: None (Preliminary result)   Collection Time: 07/04/15  6:00 PM  Result Value Ref Range Status   Specimen Description PLEURAL LEFT FLUID  Final   Special Requests NONE  Final   Fungal Smear   Final    NO YEAST OR FUNGAL ELEMENTS SEEN Performed at [facility]    Culture   Final    CULTURE IN PROGRESS FOR FOUR WEEKS Performed at [facility]    Report Status PENDING  Incomplete     Studies:  Recent x-ray studies have been reviewed in detail by the Attending Physician  Time spent :      Erin Hearing, ANP [facility] Office   [phone] Pager [phone]   **If unable to reach the above provider after paging please contact the [place] @ [phone]  On-Call/Text Page:      [email or web address]      password [email or web address]  If 7PM-7AM, please contact night-coverage [email or web address] Password [email or web address] 07/06/2015, 12:12 PM   LOS: 5 days

## 2015-07-06 NOTE — Progress Notes (Signed)
Discharge prescriptions and instructions given and explained to patient and patient's wife. Reiterated importance of taking another dose of Lovenox tonight since this will be second dose for today. Patient independently demonstrated Lovenox injection successfully with first dose today. Also reiterated instructions regarding when to start Xarelto and pleurX drainage schedule. Patient demonstrated pleurX drainage skill successfully today with assist. Wife states knowledge and experience with draining pleurX prior to hospitalization. Teachback used to confirm understanding of all instructions. All questions answered. Patient to pick up Lovenox prescription at [facility] [address] [facility]. Two boxes of pleurX drains sent home with patient.  All belongings sent with patient. PIV discontinued, no signs of bleeding or infection. Patient discharged via wheelchair by Margreta Journey, [place] and Tilda Burrow, RN.

## 2015-07-06 NOTE — Progress Notes (Signed)
Left pleurX drained 650cc clear, yellow fluid no sediment.Patient tolerated procedure well. VSS on RA.

## 2015-07-08 ENCOUNTER — Ambulatory Visit: Payer: BLUE CROSS/BLUE SHIELD | Admitting: Oncology

## 2015-07-08 ENCOUNTER — Ambulatory Visit: Payer: BLUE CROSS/BLUE SHIELD

## 2015-07-08 ENCOUNTER — Telehealth: Payer: Self-pay | Admitting: *Deleted

## 2015-07-08 ENCOUNTER — Telehealth: Payer: Self-pay | Admitting: Oncology

## 2015-07-08 ENCOUNTER — Encounter (HOSPITAL_COMMUNITY): Payer: Self-pay | Admitting: Internal Medicine

## 2015-07-08 ENCOUNTER — Other Ambulatory Visit: Payer: BLUE CROSS/BLUE SHIELD

## 2015-07-08 ENCOUNTER — Ambulatory Visit (HOSPITAL_BASED_OUTPATIENT_CLINIC_OR_DEPARTMENT_OTHER): Payer: BLUE CROSS/BLUE SHIELD | Admitting: Oncology

## 2015-07-08 VITALS — BP 117/77 | HR 109 | Temp 97.5°F | Resp 18 | Ht 70.0 in | Wt 181.6 lb

## 2015-07-08 DIAGNOSIS — E119 Type 2 diabetes mellitus without complications: Secondary | ICD-10-CM | POA: Diagnosis not present

## 2015-07-08 DIAGNOSIS — C160 Malignant neoplasm of cardia: Secondary | ICD-10-CM

## 2015-07-08 DIAGNOSIS — I2699 Other pulmonary embolism without acute cor pulmonale: Secondary | ICD-10-CM

## 2015-07-08 DIAGNOSIS — C155 Malignant neoplasm of lower third of esophagus: Secondary | ICD-10-CM | POA: Diagnosis not present

## 2015-07-08 DIAGNOSIS — I10 Essential (primary) hypertension: Secondary | ICD-10-CM | POA: Diagnosis not present

## 2015-07-08 DIAGNOSIS — I313 Pericardial effusion (noninflammatory): Secondary | ICD-10-CM

## 2015-07-08 DIAGNOSIS — Z7901 Long term (current) use of anticoagulants: Secondary | ICD-10-CM

## 2015-07-08 LAB — CULTURE, BODY FLUID W GRAM STAIN -BOTTLE

## 2015-07-08 LAB — CULTURE, BODY FLUID-BOTTLE: CULTURE: NO GROWTH

## 2015-07-08 LAB — PH, BODY FLUID: pH, Body Fluid: 8.2

## 2015-07-08 NOTE — Telephone Encounter (Signed)
Per Dr. Benay Spice, today's appt should not have been canceled; would like to see pt today at 2pm. Pt voices understanding and will be here for appt; POF sent to scheduler as urgent

## 2015-07-08 NOTE — Progress Notes (Signed)
[place] OFFICE PROGRESS NOTE   Diagnosis: Esophagus cancer  INTERVAL HISTORY:   Joel Miller was discharged on 07/06/2015. He underwent a pericardial window procedure and insertion of a left Pleurx catheter on 07/03/2015. The cytology from pericardial and left pleural fluid returned negative. The pathology from the pericardium returned negative for malignancy. He had severe dysphagia on hospital admission. He is taking an upper endoscopy on 07/05/2015 by Dr. Carlean Purl. There were no strictures, stenosis, or evidence of malignancy. The pylorus appeared normal. He reports tolerating a diet since discharge from the hospital. The dysphagia is much improved.  He has developed weakness with posterior flexion at the right wrist beginning yesterday. He denies,. No arm weakness. No neck pain.  Objective:  Vital signs in last 24 hours:  Blood pressure 117/77, pulse 109, temperature 97.5 F (36.4 C), temperature source Oral, resp. rate 18, height _0  (1.778 m), weight 181 lb 9.6 oz (82.373 kg), SpO2 98 %.    HEENT: Neck without mass Lymphatics: No cervical, supraclavicular, or right axillary nodes Resp: Decreased breath sounds throughout the right chest, inspiratory rub at the left upper chest, no respiratory distress. Bilateral Pleurx catheter in place Cardio: Regular rate and rhythm GI: No hepatomegaly Vascular: Pitting edema at the lower leg bilaterally   Lab Results:  Lab Results  Component Value Date   WBC 7.2 07/06/2015   HGB 10.2* 07/06/2015   HCT 31.1* 07/06/2015   MCV 90.4 07/06/2015   PLT 310 07/06/2015   NEUTROABS 6.5 07/01/2015      Lab Results  Component Value Date   CEA 2.2 06/03/2015    Imaging:  Dg Chest Port 1 View  07/06/2015  CLINICAL DATA:  Bilateral pleural effusions.  Subsequent encounter. EXAM: PORTABLE CHEST 1 VIEW COMPARISON:  Radiograph 07/05/2015 FINDINGS: RIGHT central venous line unchanged. Stable cardiac silhouette. There is  bilateral pleural effusions RIGHT greater than LEFT unchanged from prior. Airspace opacity in the RIGHT lower lobe is unchanged. Bilateral chest tubes at the bases. No pneumothorax. IMPRESSION: No change in bilateral pleural effusions and RIGHT basilar opacity with bilateral chest tubes in place. Electronically Signed   By: Suzy Bouchard M.D.   On: 07/06/2015 09:47   Dg Chest Port 1 View  07/05/2015  CLINICAL DATA:  Pericardial effusion EXAM: PORTABLE CHEST 1 VIEW COMPARISON:  Yesterday FINDINGS: Right jugular venous catheter stable. Volume loss and airspace opacities at the right lung base are stable. Right PleurX catheter is stable. Left PleurX catheter is stable. IMPRESSION: Stable exam. Right basilar opacities and volume loss persists. Right pleural effusion persists. Electronically Signed   By: Marybelle Killings M.D.   On: 07/05/2015 07:59    Medications: I have reviewed the patient's current medications.  Assessment/Plan: 1. Adenocarcinoma the esophagus/GE junction-status post an endoscopy 05/18/2014 confirming a gastric cardia/lower esophageal mass with tumor extending proximally in the esophagus to 25 cm from the incisors  HER-2/neu amplified   Staging CT scan 05/22/2014 with indeterminate bilateral pulmonary nodules; and prominent paraesophageal, porta hepatis, and para-aortic lymph nodes   Staging PET scan 05/29/2014 confirmed hypermetabolic soft tissue thickening at the distal third of the esophagus extending into the proximal stomach. 2 additional smaller areas of hypermetabolism or noted in the more proximal esophagus with a mildly enlarged hypermetabolic gastrohepatic node.   Initiation of radiation and concurrent Taxol/carboplatin 06/11/2014; Taxol/carboplatin completed 07/09/2014; radiation completed 07/18/2014.  Esophagectomy/jejunostomy feeding tube placement 09/05/2014 with the pathology revealing [id number] tumor, HER-2/neu amplified, positive distal resection margin  Cycle 1 FOLFOX 11/01/2014  Cycle 2 FOLFOX 11/15/2014  Cycle 3 FOLFOX 11/29/2014  Cycle 4 FOLFOX 12/13/2014  Cycle 5 FOLFOX 12/27/2014  Cycle 6 FOLFOX 01/17/2015  CTs of the chest, abdomen, and pelvis on 02/01/2015-negative for recurrent esophagus cancer  CT of the chest 05/14/2015 revealed a large right pleural effusion,small left effusion, left lung nodules, mediastinal lymphadenopathy, and a left adrenal metastasis  Placement of a right Pleurx catheter 05/24/2015  Echocardiogram 05/31/2015-LVEF 60-65%  Cycle 1 Taxol/Herceptin 06/04/2015  Cycle 2 Taxol/Herceptin 06/10/2015  Cycle 3 Taxol/Herceptin 06/17/2015  Cycle 4 Taxol/Herceptin 06/24/2015  2. History of anorexia/weight loss 3. Diabetes  4. Hypertension  5. Hyperlipidemia  6. Back pain-potentially related to chronic spine disease versus metastases. He continues Percocet as needed 7. Solid/liquid dysphasia-esophagram 07/01/2015 confirmed a stricture near the pylorus, upper endoscopy 07/05/2015 with no evidence of a stricture and no malignancy seen 8. Pulmonary embolism confirmed on a CT 07/01/2015-Lovenox initiated, to begin Xarelto 07/10/2015 9. Pericardial effusion and progressive left pleural effusion on the CT 07/01/2015-status post a pericardial window and placement of a left Pleurx 07/03/2015, negative pericardium biopsy and negative pericardial/left pleural fluid cytology 10. Right wristdrop-most likely secondary to peripheral nerve compression from sleeping in a recliner   Disposition:  [name] is recovering from the recent surgical procedures. His overall performance status has improved over the past month with the Taxol/Herceptin. I discussed the case with Dr. Servando Snare. We agree he development of pericardial/left pleural fluid does not clearly indicate disease progression. I recommend continuing Taxol/Herceptin when he has recovered from surgery.  He will continue anticoagulation for treatment  of the pulmonary embolism. He is scheduled for an office visit and the next treatment with Taxol/Herceptin on 07/15/2015. [name] will contact us in the interim for new symptoms.  I have a low clinical suspicion for a brain or cervical spine metastasis causing the wristdrop. Betsy Coder, MD  07/08/2015  3:16 PM

## 2015-07-08 NOTE — Telephone Encounter (Signed)
Appointments moved/altererd per pof and patient will get through mychart and an avs in checkout

## 2015-07-08 NOTE — Telephone Encounter (Signed)
Per staff phone call and POF I have schedueld appts. Scheduler advised of appts moved to 11/8 due to MD visit.  JMW

## 2015-07-11 ENCOUNTER — Encounter: Payer: Self-pay | Admitting: Cardiothoracic Surgery

## 2015-07-11 ENCOUNTER — Ambulatory Visit
Admission: RE | Admit: 2015-07-11 | Discharge: 2015-07-11 | Disposition: A | Discharge status: Home / Self Care | Payer: BLUE CROSS/BLUE SHIELD | Source: Ambulatory Visit | Attending: Cardiothoracic Surgery | Admitting: Cardiothoracic Surgery

## 2015-07-11 ENCOUNTER — Ambulatory Visit (INDEPENDENT_AMBULATORY_CARE_PROVIDER_SITE_OTHER): Payer: Self-pay | Admitting: Cardiothoracic Surgery

## 2015-07-11 VITALS — BP 98/66 | HR 90 | Resp 16 | Ht 70.0 in | Wt 181.0 lb

## 2015-07-11 DIAGNOSIS — C155 Malignant neoplasm of lower third of esophagus: Secondary | ICD-10-CM

## 2015-07-11 DIAGNOSIS — I313 Pericardial effusion (noninflammatory): Secondary | ICD-10-CM

## 2015-07-11 DIAGNOSIS — I3139 Other pericardial effusion (noninflammatory): Secondary | ICD-10-CM

## 2015-07-11 DIAGNOSIS — C159 Malignant neoplasm of esophagus, unspecified: Secondary | ICD-10-CM

## 2015-07-11 DIAGNOSIS — J90 Pleural effusion, not elsewhere classified: Secondary | ICD-10-CM

## 2015-07-11 DIAGNOSIS — I319 Disease of pericardium, unspecified: Secondary | ICD-10-CM

## 2015-07-11 NOTE — Progress Notes (Signed)
[address]       [place] [address]             [phone]      Thi J Paske [facility] Medical Record #[id number] Date of Birth: March 08, 1956  Referring: [name], MD Primary Care: [name], MD  Chief Complaint:   POST OP FOLLOW UP 07/03/2015 OPERATIVE REPORT PREOPERATIVE DIAGNOSES: History of esophageal cancer with recent pulmonary embolus, large pericardial effusion and increasing left pleural effusion suspected to be malignant effusion. POSTOPERATIVE DIAGNOSES: History of esophageal cancer with recent pulmonary embolus, large pericardial effusion and increasing left pleural effusion suspected to be malignant effusion. PROCEDURE: Subxiphoid pericardial window with drainage of pericardial effusion. Left PleurX catheter and drainage of left pleural effusion with ultrasound fluoroscopic guidance. 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 SURGICAL PROCEDURE: Video bronchoscopy, transhiatal total esophagectomy with cervical esophagogastrostomy, pyloroplasty, and feeding jejunostomy. 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 SURGICAL PROCEDURE: Both fluoroscopic and ultrasound guidance, placement of a right PleurX catheter, and drainage of right  pleural fluid. SURGEON: [name], MD     History of Present Illness:     Since seen in June of patient has significantly deteriorated he notes over the past 2 weeks a 15 pound weight loss poor appetite lites lack of energy. He notes that he is able to swallow without difficulty but does not feel eating is worth the effort. In late August he noted increasing cough. CT scan showed significant whiteout of the right chest since last week he has had thoracentesis 2, each time 1500 ML's is removed. So far path on the first thoracentesis shows atypical cells but is not diagnostic of cancer. CT scan of the chest suggests recurrent tumor in the right pleura and mediastinal nodes.  Right pleurx catheter was place 9/16. And patient started  on Taxol/Herceptin weekly. He feels better this week then two weeks ago  He is now recently discharged from the hospital after presenting with increasing left pleural effusion pulmonary embolus and pericardial effusion, this hospital course was also complicated by atrial fibrillation. Considering he returns to the office today doing reasonably well. Continues to drain 4-500 cc a day from his left pleural catheter, he draining the right pleural catheter once a week and just minimal amount is coming out.    Past Medical History  Diagnosis Date  . Hyperlipidemia   . Obesity     lost 80 lbs  . Hypogonadism male   . Vitamin D deficiency   . Esophageal cancer ([facility]) 05/18/14    Distal  . Allergy     HEY FEVER  . GERD (gastroesophageal reflux disease)     pt feels it was related to cancer  . S/P radiation therapy 06/11/2014-07/18/2014  . Hypertension     no med in 51months due to weight loss  . Diabetes mellitus without complication ([facility])  type II no med 60 days   . Pleural effusion      History  Smoking status  . Former Smoker -- 0.25 packs/day for 3 years  . Types: Cigars  Smokeless tobacco  . Never Used    Comment: no cigars for one year     History  Alcohol Use  . 1.2 oz/week  . 2 Standard drinks or equivalent per week    Comment: social     Allergies  Allergen Reactions  . Ace Inhibitors     cough    Current Outpatient Prescriptions  Medication Sig Dispense Refill  . amiodarone (PACERONE) 200 MG tablet Take 1 tablet (200 mg total) by mouth 2 (two) times daily. 60 tablet 1  . feeding supplement (BOOST / RESOURCE BREEZE) LIQD Take 1 Container by mouth 3 (three) times daily between meals.  0  . Multiple Vitamin (MULTIVITAMIN) capsule Take 1 capsule by mouth daily.    Marland Kitchen oxazepam (SERAX) 15 MG capsule TAKE 1 TO 2 CAPSULES BY MOUTH 1 HOUR PRIOR TO BEDTIME FOR SLEEP IF NEEDED. *STOP TAKING ZOLPIDEM* 60 capsule 2  . oxyCODONE-acetaminophen (PERCOCET/ROXICET) 5-325 MG tablet Take 1 tablet by mouth every 4 (four) hours as needed for severe pain. (Patient taking differently: Take 0.5 tablets by mouth every 4 (four) hours as needed for moderate pain or severe pain. ) 60 tablet 0  . predniSONE (DELTASONE) 10 MG tablet Take 1 tablet (10 mg total) by mouth daily with breakfast. 30 tablet 1  . prochlorperazine (COMPAZINE) 10 MG tablet Take 1 tablet (10 mg total) by mouth every 6 (six) hours as needed for nausea. 30 tablet 1  . Rivaroxaban (XARELTO STARTER PACK) 15 & 20 MG TBPK Take as directed on package: Start with one 15mg  tablet by mouth twice a day with food. On Day 22, switch to one 20mg  tablet once a day with food. Start 12 hours after last dose of Lovenox injected 51 each 0   No current facility-administered medications for this visit.   Wt Readings from Last 3 Encounters:  07/11/15 181 lb (82.101 kg)  07/08/15 181 lb 9.6 oz (82.373 kg)  07/01/15 173 lb (78.472 kg)   Physical Exam: BP 98/66 mmHg  Pulse 90  Resp 16  Ht 5\' 10"  (1.778 m)  Wt 181 lb (82.101 kg)  BMI 25.97 kg/m2  SpO2 96%  General appearance: alert, cooperative and appears stated age Neurologic: intact Heart: regular rate and rhythm, S1, S2 normal, no  murmur, click, rub or gallop Lungs: clear to auscultation on the left but decreased at the right base Abdomen: soft, non-tender; bowel sounds normal; no masses,  no organomegaly and Abdominal wound is healing well Extremities: extremities normal, atraumatic, no cyanosis or edema and Homans sign is negative, no sign of DVT Wound: Left neck incision is healing well I do not appreciate any cervical or supraclavicular adenopathy  Pleurx catheter is intact  Diagnostic Studies & Laboratory data:     Recent Radiology Findings:  Dg Chest 2 View  07/11/2015  CLINICAL DATA:  Esophageal malignancy status post surgery on July 05, 2015, bilateral pleural drains for pleural effusions. EXAM: CHEST  2 VIEW COMPARISON:  Portable chest x-ray of July 06, 2015 FINDINGS: There are bilateral pleural effusions greater on the right than on the left. Allowing for differences in positioning there has not been significant interval change. PleurX drains are present bilaterally and appear unchanged in position. There is no pneumothorax. Right basilar density persists. The interstitial markings in  the mid and lower lungs remain increased and are slightly more conspicuous on the left. The heart and pulmonary vascularity are normal. The mediastinum is normal in width. IMPRESSION: Persistent small bilateral pleural effusions greater on the right than on the left. There is no pneumothorax. There is stable density at the right lung base. Slightly increased interstitial density is noted at the left base which may reflect atelectasis or pneumonia. Electronically Signed   By: David  Martinique M.D.   On: 07/11/2015 15:05   Dg Chest 2 View  06/13/2015   CLINICAL DATA:  History of esophageal malignancy with surgery in January of 2016, history of pleural effusions, currently asymptomatic.  EXAM: CHEST  2 VIEW  COMPARISON:  Portable chest x-ray of May 24, 2015  FINDINGS: There remains a smaller moderate-sized right pleural effusion.  The small caliber right chest tube is unchanged in position at the inferior aspect of the pleural space. A small left pleural effusion has increased in size since September. There is no pneumothorax. The pulmonary interstitial markings are slightly more conspicuous today especially on the left. The heart is normal in size. The pulmonary vascularity is not engorged. The bony thorax exhibits no acute abnormality.  IMPRESSION: 1. Mildly increased pulmonary interstitial markings especially on the left since the previous study. This may reflect interstitial edema or early interstitial pneumonia. 2. Stable small a moderate sized right pleural effusion with stable small caliber chest tube. Slight interval increase in the volume of the small left pleural effusion.   Electronically Signed   By: David  Martinique M.D.   On: 06/13/2015 14:22   Ct Angio Chest Pe W/cm &/or Wo Cm  05/14/2015   CLINICAL DATA:  Cough and shortness of breath for 1 week. History of esophageal carcinoma diagnosed in the fall of 2015.  EXAM: CT ANGIOGRAPHY CHEST WITH CONTRAST  TECHNIQUE: Multidetector CT imaging of the chest was performed using the standard protocol during bolus administration of intravenous contrast. Multiplanar CT image reconstructions and MIPs were obtained to evaluate the vascular anatomy.  CONTRAST:  80 mL OMNIPAQUE IOHEXOL 350 MG/ML SOLN  COMPARISON:  CT chest and abdomen 01/31/2015 and PA and lateral chest 04/25/2015.  FINDINGS: No pulmonary embolus is identified. The patient has a massive right pleural effusion which has markedly increased since the prior plain films. A smaller left pleural effusion is identified. There is no pericardial effusion. Heart size is normal.  Postoperative change of esophagectomy and gastric pull-through is again seen. There is new mediastinal lymphadenopathy. Index AP window node on image 34 measures 1.7 cm in diameter. A prevascular node on image 33 is barely perceptible on the prior examination and  now measures 1.0 cm short axis dimension on image 33. There is also new and extensive juxtaphrenic lymphadenopathy best seen on the right. Index node on image 79 measures 3.1 x 2.3 cm. Pleural thickening and nodularity are identified on the right most consistent with metastatic disease.  The right lung is nearly completely collapsed secondary to compressive atelectasis from the patient's effusion. A new left lower lobe pulmonary nodule image 60 measures 0.7 cm. A second new left lower lobe pulmonary nodule on image 62 measures 0.9 cm in diameter. A left upper lobe nodule on image 39 measures 0.5 cm in diameter. Several additional smaller nodules are seen in the left lung.  Imaged upper abdomen demonstrates a new left adrenal nodule measuring 2.9 x 1.7 cm on image 95. There is some haziness of the mesenteric about the pancreas which is  also new since the prior examination. Imaged upper abdomen is otherwise unremarkable. No lytic or sclerotic bony lesions identified.  Review of the MIP images confirms the above findings.  IMPRESSION: Negative for pulmonary embolus.  Findings consistent with extensive new metastatic esophageal carcinoma with a very large pleural effusion on the right with pleural nodularity consistent with a malignant effusion. The effusion nearly completely compresses the left lung. There is also new mediastinal and juxtaphrenic lymphadenopathy, left pulmonary nodules and a left adrenal nodule all most consistent with metastatic disease.  Small left pleural effusion.  Stranding of fat about the pancreas is nonspecific and could be due to pancreatitis or mesenteritis.   Electronically Signed   By: Inge Rise M.D.   On: 05/14/2015 10:58   Ct Chest W Contrast  02/01/2015   CLINICAL DATA:  Patient with distal esophageal carcinoma diagnosed 05/18/2014. Status post chemotherapy and radiation. Patient status post esophagectomy 09/05/2014.  EXAM: CT CHEST AND ABDOMEN WITHOUT CONTRAST  TECHNIQUE:  Multidetector CT imaging of the chest and abdomen was performed following the standard protocol without intravenous contrast.  COMPARISON:  CT chest 08/09/2014 ; PET-CT 05/29/2014  FINDINGS: CT CHEST FINDINGS  Mediastinum/Nodes: Right anterior chest wall Port-A-Cath is present with tip terminating at the superior cavoatrial junction. No enlarged axillary, mediastinal or hilar lymphadenopathy. Normal heart size. No pericardial effusion. Postoperative changes compatible with esophagectomy and gastric pull-through. No mediastinal fluid collections are identified.  Lungs/Pleura: Central airways are patent. Minimal scarring and or atelectasis medial right lower lobe (image 38; series 4). No large nodular or consolidative pulmonary opacity. Minimal tiny 1 mm nodules within the peripheral lungs bilaterally are unchanged from prior. No pleural effusion or pneumothorax.  Musculoskeletal: Thoracic spine degenerative changes without aggressive or acute appearing osseous lesions.  CT ABDOMEN FINDINGS  Hepatobiliary: Liver is normal in size and contour without focal hepatic lesion identified. Gallbladder is unremarkable. No intrahepatic or extrahepatic biliary ductal dilatation.  Pancreas: Unremarkable  Spleen: Unremarkable  Adrenals/Urinary Tract: Stable thickening of the bilateral adrenal glands. Kidneys enhance symmetrically with contrast. No hydronephrosis.  Stomach/Bowel: Postoperative changes compatible with esophagectomy and gastric pull-through. No evidence for abnormal bowel wall thickening or bowel obstruction. There is nonspecific mesenteric fat stranding within the visualized upper abdomen. No free intraperitoneal air or fluid.  Vascular/Lymphatic: Normal caliber abdominal aorta. No retroperitoneal lymphadenopathy.  Other: Postsurgical changes midline abdominal soft tissues.  Musculoskeletal: Lower lumbar spine degenerative changes. No aggressive or acute appearing osseous lesions.  IMPRESSION: Patient status post  interval esophagectomy and pull-through without evidence for locally recurrent or distant metastatic disease.  Nonspecific mesenteric fat stranding within the visualized upper abdomen is favored to be postoperative in etiology.   Electronically Signed   By: Lovey Newcomer M.D.   On: 02/01/2015 08:34       Recent Lab Findings: Lab Results  Component Value Date   WBC 7.2 07/06/2015   HGB 10.2* 07/06/2015   HCT 31.1* 07/06/2015   PLT 310 07/06/2015   GLUCOSE 120* 07/05/2015   CHOL 134 04/25/2015   TRIG 72 04/25/2015   HDL 41 04/25/2015   LDLCALC 79 04/25/2015   ALT 75* 07/05/2015   AST 45* 07/05/2015   NA 130* 07/05/2015   K 3.6 07/05/2015   CL 99* 07/05/2015   CREATININE 0.81 07/05/2015   BUN 16 07/05/2015   CO2 25 07/05/2015   TSH 1.106 04/25/2015   INR 1.62* 07/02/2015   HGBA1C 5.9* 04/25/2015      Assessment / Plan:  Adenocarcinoma the esophagus/GE junction-status post an transhiatal total esophagectomy for a stage IIIB adenocarcinoma, now with what appears to be a malignant right pleural effusion and CT evidence of more diffuse disease.  Patient will continue with drainage of the right Pleurx catheter once a week, and for now the left catheter once a day  We'll plan to see him back in 4 weeks with a follow-up chest x-ray, he will continue to report to [place] the amount of drainage , will consider removal of the right catheter in the next several weeks  [name] MD      [address] [place] [address] Office [phone]   Beeper [phone]  07/11/2015 5:17 PM

## 2015-07-14 ENCOUNTER — Other Ambulatory Visit: Payer: Self-pay | Admitting: Oncology

## 2015-07-14 NOTE — Progress Notes (Signed)
Cardiology Office Note   Date:  07/16/2015   ID:  Joel Miller, DOB Jan 02, 1956, MRN [id number]  PCP:  [name], MD  Cardiologist:  [facility] follow up    History of Present Illness: Joel Miller is a 59 y.o. 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   He was admitted on 07/01/2015 for management of newly diagnosed pulmonary embolism and worsening dysphagia. He had a several month history of ongoing treatment for 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 Patient had a Pleurx catheter placed several weeks prior with an average of 400 mL out per 24 hours. Over several days prior to presentation he noted marked diminished output from drain of 20 mL. Patient was also reporting worsening cough. Because of the symptoms he was evaluated by Dr. Benay Spice at the cancer center. He also reported issues with dysphagia. With further questioning patient reported lower extremity edema which was worsening as well as increasing shortness of breath especially dyspnea on exertion. CT of the chest performed on 10/24 revealed focal pulmonary emboli in the proximal lingular pulmonary artery branches without larger pulmonary emboli identified and no evidence of right heart strain. Also noted bilateral pleural effusions left greater than right with pleural drainage catheter in place on the right. Also incidental finding of a sizable pericardial effusion. Patient was sent to [facility] for admission and was admitted by [facility] hospitalist on  same date. Beause of the dysphagia patient also underwent an esophagram on 10/24 which revealed a stricture at the pylorus. Gastroenterology was consulted. Because of the pericardial effusion cardiothoracic surgery was also consulted. A 2-D echocardiogram completed on 10/25 will a large, free flowing pericardial effusion circumferential to the heart without tamponade physiology. Patient was subsequently transferred to [facility] at the request of thoracic surgery.  On 10/26 patient underwent a subxiphoid pericardial window with drainage of pericardial effusion as well as placement of the left Pleurx catheter for drainage of left pleural effusion under ultrasound fluoroscopic guidance. Patient required ICU evaluation anemia postop. But with subsequent only transferred out to the stepdown unit. In the postoperative period patient did have paroxysmal atrial fibrillation and cardiology was consulted and patient was started on amiodarone after undergoing cardioversion on 10/27. On 10/27 bilateral lower extremity duplex was completed and showed no evidence of DVT. He underwent an EGD on 10/28 which revealed normal postop anatomy with a few proximal gastric erosions but no significant stenoses, strictures or signs of malignancy. Subsequently he has tolerated a full liquid diet with nutrition following regarding appropriate protein intake. He was discharged on 07/06/15.  Today he presents for post hospital follow up. He is feeling well. He has no chest pain,  palpitations, orthopnea or PND. No dizziness, lightheadedness or syncope. No blood in his stool or urine. He is gaining more and more strength day by day. His shortness of breath is improving. He has some LE edema since the hospital for which he elevates his legs. Doppler in hospital negative for DVT    Past Medical History  Diagnosis Date  .  Hyperlipidemia   . Obesity     lost 80 lbs  . Hypogonadism male   . Vitamin D deficiency   . Esophageal cancer  ([place]) 05/18/14    Distal  . Allergy     HEY FEVER  . GERD (gastroesophageal reflux disease)     pt feels it was related to cancer  . S/P radiation therapy 06/11/2014-07/18/2014  . Hypertension     no med in 69months due to weight loss  . Diabetes mellitus without complication ([place])     type II no med 60 days   . Pleural effusion     Past Surgical History  Procedure Laterality Date  . Colonoscopy    . 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 Complete esophagectomy N/A 09/05/2014    Procedure: TRANSHIATAL TOTAL ESOPHAGECTOMY; Cervical esophagogastrostomy;  Surgeon: [name], MD;  Location: [place];  Service: Thoracic;  Laterality: N/A;  . Video bronchoscopy N/A 09/05/2014    Procedure: VIDEO BRONCHOSCOPY;  Surgeon: [name], MD;  Location: [place];  Service: Thoracic;  Laterality: N/A;  . Jejunostomy N/A 09/05/2014    Procedure: FEEDING JEJUNOSTOMY;  Surgeon: [name], MD;  Location: [place];  Service: Thoracic;  Laterality: N/A;  . Pyloroplasty N/A 09/05/2014    Procedure: PYLOROPLASTY;  Surgeon: [name], MD;  Location: [place];  Service: Thoracic;  Laterality: N/A;  . Wisdom tooth extraction      2010  . Chest tube insertion Right 05/24/2015    Procedure: INSERTION PLEURAL DRAINAGE CATHETER;  Surgeon: [name], MD;  Location: [facility] [place] [facility];  Service: Thoracic;  Laterality: Right;  . Subxyphoid pericardial window N/A 07/03/2015    Procedure: SUBXYPHOID PERICARDIAL WINDOW;  Surgeon: [name], MD;  Location: [place];  Service: Thoracic;  Laterality: N/A;  . Chest tube insertion Left 07/03/2015    Procedure: INSERTION PLEURAL DRAINAGE CATHETER;  Surgeon: [name], MD;  Location: [place];  Service: Thoracic;  Laterality: Left;  . Cardioversion N/A 07/04/2015    Procedure: Bedside CARDIOVERSION;  Surgeon: Josue Hector, MD;  Location: [place];  Service: Cardiovascular;  Laterality: N/A;  . Esophagogastroduodenoscopy N/A 07/05/2015    Procedure:  ESOPHAGOGASTRODUODENOSCOPY (EGD);  Surgeon: Gatha Mayer, MD;  Location: [facility] ENDOSCOPY;  Service: Endoscopy;  Laterality: N/A;     Current Outpatient Prescriptions  Medication Sig Dispense Refill  . amiodarone (PACERONE) 200 MG tablet Take 1 tablet (200 mg total) by mouth daily. 30 tablet 11  . feeding supplement (BOOST / RESOURCE BREEZE) LIQD Take 1 Container by mouth 3 (three) times daily between meals.  0  . oxazepam (SERAX) 15 MG capsule TAKE 1 TO 2 CAPSULES BY MOUTH 1 HOUR PRIOR TO BEDTIME FOR SLEEP IF NEEDED. *STOP TAKING ZOLPIDEM* 60 capsule 2  . oxyCODONE-acetaminophen (PERCOCET/ROXICET) 5-325 MG tablet Take 0.5 tablets by mouth every 4 (four) hours as needed for moderate pain or severe pain. 60 tablet 0  . prochlorperazine (COMPAZINE) 10 MG tablet Take 1 tablet (10 mg total) by mouth every 6 (six) hours as needed for nausea. 30 tablet 1  . Rivaroxaban (XARELTO STARTER PACK) 15 & 20 MG TBPK Take as directed on package: Start with one 15mg  tablet by mouth twice a day with food. On Day 22, switch to one 20mg  tablet once a day with food. Start 12 hours after last dose of Lovenox injected 51 each 0   No current facility-administered medications for this visit.   Facility-Administered Medications Ordered in Other  Visits  Medication Dose Route Frequency Provider Last Rate Last Dose  . alteplase (CATHFLO ACTIVASE) injection 2 mg  2 mg Intracatheter Once PRN [name], MD      . heparin lock flush 100 unit/mL  500 Units Intracatheter Once PRN [name], MD      . heparin lock flush 100 unit/mL  250 Units Intracatheter Once PRN [name], MD      . sodium chloride 0.9 % injection 10 mL  10 mL Intracatheter PRN [name], MD      . sodium chloride 0.9 % injection 3 mL  3 mL Intravenous PRN [name], MD        Allergies:   Ace inhibitors    Social History:  The patient  reports that he has quit smoking. His smoking use included Cigars. He has never used  smokeless tobacco. He reports that he drinks about 1.2 oz of alcohol per week. He reports that he does not use illicit drugs.   Family History:  The patient's family history includes Alzheimer's disease in his mother; Cancer (age of onset: 22) in his father; Hypertension in his mother; Prostate cancer in his brother. There is no history of Colon cancer or Colon polyps.    ROS:  Please see the history of present illness.   Otherwise, review of systems are positive for none.   All other systems are reviewed and negative.    PHYSICAL EXAM: VS:  BP 92/54 mmHg  Pulse 79  Ht 5\' 10"  (1.778 m)  Wt 171 lb (77.565 kg)  BMI 24.54 kg/m2 , BMI Body mass index is 24.54 kg/(m^2). GEN: Well nourished, well developed, in no acute distress HEENT: normal Neck: no JVD, carotid bruits, or masses Cardiac: RRR; no murmurs, rubs, or gallops, 2+ pitting edema bilaterally  Respiratory:  clear to auscultation bilaterally, normal work of breathing GI: soft, nontender, nondistended, + BS MS: no deformity or atrophy Skin: warm and dry, no rash Neuro:  Strength and sensation are intact Psych: euthymic mood, full affect   EKG:  EKG is ordered today. The ekg ordered today demonstrates NSR HR 96. TWI in almost all leads.   Recent Labs: 04/25/2015: Magnesium 2.0; TSH 1.106 05/10/2015: Pro B Natriuretic peptide (BNP) 29.0 07/05/2015: ALT 75*; BUN 16; Creatinine, Ser 0.81; Potassium 3.6; Sodium 130* 07/15/2015: HGB 12.1*; Platelets 438*    Lipid Panel    Component Value Date/Time   CHOL 134 04/25/2015 1211   TRIG 72 04/25/2015 1211   HDL 41 04/25/2015 1211   CHOLHDL 3.3 04/25/2015 1211   VLDL 14 04/25/2015 1211   LDLCALC 79 04/25/2015 1211      Wt Readings from Last 3 Encounters:  07/16/15 171 lb (77.565 kg)  07/15/15 171 lb 11.2 oz (77.883 kg)  07/11/15 181 lb (82.101 kg)      Other studies Reviewed: Additional studies/ records that were reviewed today include: 2D ECHO Review of the above records  demonstrates:  --  Echo on 07/02/2015 showed EF of 50% - 49%, grade 1 diastolic dysfunction, and a large, free-flowing pericardial effusion was identified circumferential to the heart with no evidence of hemodynamic compromise   ASSESSMENT AND PLAN:  Joel Miller is a 59 y.o. 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   PAF -- S/P DCCV on 10/27/ 16 and is maintaining sinus rhythm  -- He has been on amiodarone 200mg  BID. We will go down to 200mg  qd -- CHADVASc =2 (PMH: HTN and DM) -- Continue Xarelto 15mg  BID (dosed for acute PE)  Pericardial effusion s/p subxiphoid pericardial window 10/26 -- Echo on 07/02/2015 showed EF of 50% - 38%, grade 1 diastolic dysfunction, and a large, free-flowing pericardial effusion was identified circumferential to the heart with no evidence of hemodynamic compromise  Bilateral pleural effusions- previous R Pleurx catheter, s/p L Pleurx catheter for drainage of left pleural effusion under ultrasound fluoroscopic guidance on recent admission. Followed by Dr. Servando Snare.   Acute pulmonary embolism- discharged on lovenox to be converted to Xarelto 15mg  po BID  Anemia of chronic disease -- Hemoglobin stable at 10.2  Upon discharge   Carcinoma of the distal third of the esophagus- follows with Dr. Benay Spice   HTN- BP 92/54 today on no anti hypertensives.   LE edema- this does not bother him. Lower extremity doppler in hospital negative for DVT. Continue elevation.   Current medicines are reviewed at length with the patient today.  The patient does not have concerns regarding  medicines.  The following changes have been made:  Decrease amiodarone from 200mg  BID to 200mg  qd.   Labs/ tests ordered today include: none   Orders Placed This Encounter  Procedures  . EKG 12-Lead     Disposition:   FU with Dr Johnsie Cancel in 2-3 months.   Renea Ee  07/16/2015 4:51 PM    [facility] [address], [place], [place]  [address] Phone: [phone]; Fax: [phone]

## 2015-07-15 ENCOUNTER — Ambulatory Visit (HOSPITAL_BASED_OUTPATIENT_CLINIC_OR_DEPARTMENT_OTHER): Payer: BLUE CROSS/BLUE SHIELD | Admitting: Oncology

## 2015-07-15 ENCOUNTER — Other Ambulatory Visit: Payer: BLUE CROSS/BLUE SHIELD

## 2015-07-15 ENCOUNTER — Telehealth: Payer: Self-pay | Admitting: Oncology

## 2015-07-15 ENCOUNTER — Ambulatory Visit: Payer: BLUE CROSS/BLUE SHIELD

## 2015-07-15 ENCOUNTER — Other Ambulatory Visit: Payer: Self-pay | Admitting: *Deleted

## 2015-07-15 ENCOUNTER — Other Ambulatory Visit (HOSPITAL_BASED_OUTPATIENT_CLINIC_OR_DEPARTMENT_OTHER): Payer: BLUE CROSS/BLUE SHIELD

## 2015-07-15 ENCOUNTER — Other Ambulatory Visit: Payer: Self-pay | Admitting: Oncology

## 2015-07-15 VITALS — BP 126/87 | HR 91 | Temp 97.6°F | Resp 18 | Ht 70.0 in | Wt 171.7 lb

## 2015-07-15 DIAGNOSIS — I2699 Other pulmonary embolism without acute cor pulmonale: Secondary | ICD-10-CM

## 2015-07-15 DIAGNOSIS — C155 Malignant neoplasm of lower third of esophagus: Secondary | ICD-10-CM

## 2015-07-15 DIAGNOSIS — Z7901 Long term (current) use of anticoagulants: Secondary | ICD-10-CM

## 2015-07-15 DIAGNOSIS — G55 Nerve root and plexus compressions in diseases classified elsewhere: Secondary | ICD-10-CM

## 2015-07-15 DIAGNOSIS — C160 Malignant neoplasm of cardia: Secondary | ICD-10-CM

## 2015-07-15 DIAGNOSIS — I10 Essential (primary) hypertension: Secondary | ICD-10-CM

## 2015-07-15 DIAGNOSIS — E119 Type 2 diabetes mellitus without complications: Secondary | ICD-10-CM | POA: Diagnosis not present

## 2015-07-15 LAB — CBC WITH DIFFERENTIAL/PLATELET
BASO%: 0.5 % (ref 0.0–2.0)
Basophils Absolute: 0.1 10*3/uL (ref 0.0–0.1)
EOS%: 0.2 % (ref 0.0–7.0)
Eosinophils Absolute: 0 10*3/uL (ref 0.0–0.5)
HCT: 37.8 % — ABNORMAL LOW (ref 38.4–49.9)
HGB: 12.1 g/dL — ABNORMAL LOW (ref 13.0–17.1)
LYMPH%: 1.2 % — AB (ref 14.0–49.0)
MCH: 28.8 pg (ref 27.2–33.4)
MCHC: 32 g/dL (ref 32.0–36.0)
MCV: 89.9 fL (ref 79.3–98.0)
MONO#: 0.9 10*3/uL (ref 0.1–0.9)
MONO%: 6.8 % (ref 0.0–14.0)
NEUT#: 11.8 10*3/uL — ABNORMAL HIGH (ref 1.5–6.5)
NEUT%: 91.3 % — AB (ref 39.0–75.0)
Platelets: 438 10*3/uL — ABNORMAL HIGH (ref 140–400)
RBC: 4.2 10*6/uL (ref 4.20–5.82)
RDW: 17.7 % — ABNORMAL HIGH (ref 11.0–14.6)
WBC: 13 10*3/uL — ABNORMAL HIGH (ref 4.0–10.3)
lymph#: 0.2 10*3/uL — ABNORMAL LOW (ref 0.9–3.3)

## 2015-07-15 MED ORDER — ONDANSETRON HCL 8 MG PO TABS: 8.0000 mg | ORAL_TABLET | Freq: Three times a day (TID) | ORAL | Status: DC | PRN

## 2015-07-15 MED ORDER — OXYCODONE-ACETAMINOPHEN 5-325 MG PO TABS: 0.5000 | ORAL_TABLET | ORAL | Status: DC | PRN

## 2015-07-15 NOTE — Progress Notes (Signed)
[place] OFFICE PROGRESS NOTE   Diagnosis: Gastroesophageal carcinoma  INTERVAL HISTORY:   He returns as scheduled. No dysphagia. The left Pleurx catheter draining 350 mL today. He can ambulate in the home. He continues to have weakness at the right wrist. No other weakness.  Objective:  Vital signs in last 24 hours:  Blood pressure 126/87, pulse 91, temperature 97.6 F (36.4 C), temperature source Oral, resp. rate 18, height _0  (1.778 m), weight 171 lb 11.2 oz (77.883 kg), SpO2 100 %.   Resp: Distant breath sounds bilaterally, inspiratory rub at the left upper anterior chest, no respiratory distress, bilateral Pleuryx in place Cardio: Regular rate and rhythm GI: No hepatomegaly, nontender Vascular: 2+ pitting edema below the knee bilaterally Neuro:0-1/5 strength with dorsi flexion at the right wrist, mild decreased grip strength with the right hand. The arm and hand strength are otherwise intact.     Lab Results:  Lab Results  Component Value Date   WBC 13.0* 07/15/2015   HGB 12.1* 07/15/2015   HCT 37.8* 07/15/2015   MCV 89.9 07/15/2015   PLT 438* 07/15/2015   NEUTROABS 11.8* 07/15/2015    Medications: I have reviewed the patient's current medications.  Assessment/Plan: 1. Adenocarcinoma the esophagus/GE junction-status post an endoscopy 05/18/2014 confirming a gastric cardia/lower esophageal mass with tumor extending proximally in the esophagus to 25 cm from the incisors  HER-2/neu amplified   Staging CT scan 05/22/2014 with indeterminate bilateral pulmonary nodules; and prominent paraesophageal, porta hepatis, and para-aortic lymph nodes   Staging PET scan 05/29/2014 confirmed hypermetabolic soft tissue thickening at the distal third of the esophagus extending into the proximal stomach. 2 additional smaller areas of hypermetabolism or noted in the more proximal esophagus with a mildly enlarged hypermetabolic gastrohepatic node.   Initiation  of radiation and concurrent Taxol/carboplatin 06/11/2014; Taxol/carboplatin completed 07/09/2014; radiation completed 07/18/2014.  Esophagectomy/jejunostomy feeding tube placement 09/05/2014 with the pathology revealing [id number] tumor, HER-2/neu amplified, positive distal resection margin   Cycle 1 FOLFOX 11/01/2014  Cycle 2 FOLFOX 11/15/2014  Cycle 3 FOLFOX 11/29/2014  Cycle 4 FOLFOX 12/13/2014  Cycle 5 FOLFOX 12/27/2014  Cycle 6 FOLFOX 01/17/2015  CTs of the chest, abdomen, and pelvis on 02/01/2015-negative for recurrent esophagus cancer  CT of the chest 05/14/2015 revealed a large right pleural effusion,small left effusion, left lung nodules, mediastinal lymphadenopathy, and a left adrenal metastasis  Placement of a right Pleurx catheter 05/24/2015  Echocardiogram 05/31/2015-LVEF 60-65%  Cycle 1 Taxol/Herceptin 06/04/2015  Cycle 2 Taxol/Herceptin 06/10/2015  Cycle 3 Taxol/Herceptin 06/17/2015  Cycle 4 Taxol/Herceptin 06/24/2015  2. History of anorexia/weight loss 3. Diabetes  4. Hypertension  5. Hyperlipidemia  6. Back pain-potentially related to chronic spine disease versus metastases. He continues Percocet as needed 7. Solid/liquid dysphasia-esophagram 07/01/2015 confirmed a stricture near the pylorus, upper endoscopy 07/05/2015 with no evidence of a stricture and no malignancy seen 8. Pulmonary embolism confirmed on a CT 07/01/2015-Lovenox initiated, to begin Xarelto 07/10/2015 9. Pericardial effusion and progressive left pleural effusion on the CT 07/01/2015-status post a pericardial window and placement of a left Pleurx 07/03/2015, negative pericardium biopsy and negative pericardial/left pleural fluid cytology 10. Right wristdrop-most likely secondary to peripheral nerve compression from sleeping in a recliner    Disposition:  He appears unchanged. He would like to proceed with Taxol/Herceptin beginning 07/16/2015. He will return for another treatment  on 07/22/2015 and an office visit on 07/26/2015. I suspect the right wristdrop is related to peripheral nerve compression. We made a physical therapy referral.  We will image the cervical spine if this does not improve over the next few weeks or if he has new symptoms.  He understands the high likelihood of metastatic esophagus cancer despite the negative pleural fluid cytology and pericardium biopsy.   Betsy Coder, MD  07/15/2015  3:17 PM

## 2015-07-15 NOTE — Telephone Encounter (Signed)
Gave patient avs report and appointments for November. Spoke with Kizzie at [facility] - referral in EPIC visible to office and per Georgiann Mccoy they will call with appointment - patient aware.

## 2015-07-16 ENCOUNTER — Encounter: Payer: Self-pay | Admitting: *Deleted

## 2015-07-16 ENCOUNTER — Ambulatory Visit (HOSPITAL_BASED_OUTPATIENT_CLINIC_OR_DEPARTMENT_OTHER): Payer: BLUE CROSS/BLUE SHIELD

## 2015-07-16 ENCOUNTER — Encounter: Payer: Self-pay | Admitting: Physician Assistant

## 2015-07-16 ENCOUNTER — Ambulatory Visit (HOSPITAL_BASED_OUTPATIENT_CLINIC_OR_DEPARTMENT_OTHER): Payer: BLUE CROSS/BLUE SHIELD | Admitting: Nurse Practitioner

## 2015-07-16 ENCOUNTER — Ambulatory Visit (INDEPENDENT_AMBULATORY_CARE_PROVIDER_SITE_OTHER): Payer: BLUE CROSS/BLUE SHIELD | Admitting: Physician Assistant

## 2015-07-16 VITALS — BP 92/54 | HR 79 | Ht 70.0 in | Wt 171.0 lb

## 2015-07-16 VITALS — HR 90 | Temp 97.4°F | Resp 18

## 2015-07-16 DIAGNOSIS — Z5111 Encounter for antineoplastic chemotherapy: Secondary | ICD-10-CM | POA: Diagnosis not present

## 2015-07-16 DIAGNOSIS — Z5112 Encounter for antineoplastic immunotherapy: Secondary | ICD-10-CM

## 2015-07-16 DIAGNOSIS — C155 Malignant neoplasm of lower third of esophagus: Secondary | ICD-10-CM | POA: Diagnosis not present

## 2015-07-16 DIAGNOSIS — I480 Paroxysmal atrial fibrillation: Secondary | ICD-10-CM | POA: Diagnosis not present

## 2015-07-16 DIAGNOSIS — T8089XA Other complications following infusion, transfusion and therapeutic injection, initial encounter: Secondary | ICD-10-CM | POA: Diagnosis not present

## 2015-07-16 MED ORDER — PROCHLORPERAZINE MALEATE 10 MG PO TABS
ORAL_TABLET | ORAL | Status: AC
  Filled 2015-07-16: qty 1

## 2015-07-16 MED ORDER — SODIUM CHLORIDE 0.9 % IV SOLN
Freq: Once | INTRAVENOUS | Status: AC
  Administered 2015-07-16: 09:00:00 via INTRAVENOUS

## 2015-07-16 MED ORDER — SODIUM CHLORIDE 0.9 % IV SOLN
80.0000 mg/m2 | Freq: Once | INTRAVENOUS | Status: AC
  Administered 2015-07-16: 156 mg via INTRAVENOUS
  Filled 2015-07-16: qty 26

## 2015-07-16 MED ORDER — PROCHLORPERAZINE MALEATE 10 MG PO TABS
10.0000 mg | ORAL_TABLET | Freq: Once | ORAL | Status: AC
  Administered 2015-07-16: 10 mg via ORAL

## 2015-07-16 MED ORDER — DIPHENHYDRAMINE HCL 50 MG/ML IJ SOLN
25.0000 mg | Freq: Once | INTRAMUSCULAR | Status: AC
  Administered 2015-07-16: 25 mg via INTRAVENOUS

## 2015-07-16 MED ORDER — SODIUM CHLORIDE 0.9 % IJ SOLN
3.0000 mL | INTRAMUSCULAR | Status: DC | PRN
  Filled 2015-07-16: qty 10

## 2015-07-16 MED ORDER — SODIUM CHLORIDE 0.9 % IJ SOLN
10.0000 mL | INTRAMUSCULAR | Status: DC | PRN
  Filled 2015-07-16: qty 10

## 2015-07-16 MED ORDER — AMIODARONE HCL 200 MG PO TABS: 200.0000 mg | ORAL_TABLET | Freq: Every day | ORAL | Status: DC

## 2015-07-16 MED ORDER — ALTEPLASE 2 MG IJ SOLR
2.0000 mg | Freq: Once | INTRAMUSCULAR | Status: DC | PRN
  Filled 2015-07-16: qty 2

## 2015-07-16 MED ORDER — DIPHENHYDRAMINE HCL 50 MG/ML IJ SOLN
INTRAMUSCULAR | Status: AC
  Filled 2015-07-16: qty 1

## 2015-07-16 MED ORDER — FAMOTIDINE IN NACL 20-0.9 MG/50ML-% IV SOLN
INTRAVENOUS | Status: AC
  Filled 2015-07-16: qty 50

## 2015-07-16 MED ORDER — SODIUM CHLORIDE 0.9 % IV SOLN
2.0000 mg/kg | Freq: Once | INTRAVENOUS | Status: AC
  Administered 2015-07-16: 147 mg via INTRAVENOUS
  Filled 2015-07-16: qty 7

## 2015-07-16 MED ORDER — FAMOTIDINE IN NACL 20-0.9 MG/50ML-% IV SOLN
20.0000 mg | Freq: Once | INTRAVENOUS | Status: AC
  Administered 2015-07-16: 20 mg via INTRAVENOUS

## 2015-07-16 MED ORDER — HEPARIN SOD (PORK) LOCK FLUSH 100 UNIT/ML IV SOLN
500.0000 [IU] | Freq: Once | INTRAVENOUS | Status: DC | PRN
  Filled 2015-07-16: qty 5

## 2015-07-16 MED ORDER — SODIUM CHLORIDE 0.9 % IV SOLN
Freq: Once | INTRAVENOUS | Status: AC
  Administered 2015-07-16: 09:00:00 via INTRAVENOUS
  Filled 2015-07-16: qty 1

## 2015-07-16 MED ORDER — ACETAMINOPHEN 325 MG PO TABS
650.0000 mg | ORAL_TABLET | Freq: Once | ORAL | Status: AC
  Administered 2015-07-16: 650 mg via ORAL

## 2015-07-16 MED ORDER — HEPARIN SOD (PORK) LOCK FLUSH 100 UNIT/ML IV SOLN
250.0000 [IU] | Freq: Once | INTRAVENOUS | Status: DC | PRN
  Filled 2015-07-16: qty 5

## 2015-07-16 MED ORDER — PACLITAXEL CHEMO INJECTION 300 MG/50ML: 80.0000 mg/m2 | Freq: Once | INTRAVENOUS | Status: DC

## 2015-07-16 MED ORDER — ACETAMINOPHEN 325 MG PO TABS
ORAL_TABLET | ORAL | Status: AC
  Filled 2015-07-16: qty 2

## 2015-07-16 NOTE — Patient Instructions (Signed)
Medication Instructions:  Your physician has recommended you make the following change in your medication:  1- Decrease Amiodarone 200 mg by mouth daily  Labwork: NONE  Testing/Procedures: NONE  Follow-Up: Your physician recommends that you schedule a follow-up appointment in: February with Dr. Johnsie Cancel.   Any Other Special Instructions Will Be Listed Below (If Applicable).     If you need a refill on your cardiac medications before your next appointment, please call your pharmacy.

## 2015-07-16 NOTE — Progress Notes (Signed)
Per end of treatment with IV d/c - RN noted swollen hand and right lower arm. IV site clean and dry with no blood return.  Anterior arm cool to touch with warmth to posterior side.  Due to expected extravasation MD notified and NP with symptom management.  Extravasation protocol followed and written instructions given to pt.  Note site was without redness and no pain including when touched.  Pt given appointments for follow up.

## 2015-07-17 ENCOUNTER — Encounter: Payer: Self-pay | Admitting: Nurse Practitioner

## 2015-07-17 ENCOUNTER — Ambulatory Visit (HOSPITAL_BASED_OUTPATIENT_CLINIC_OR_DEPARTMENT_OTHER): Payer: Self-pay

## 2015-07-17 DIAGNOSIS — C155 Malignant neoplasm of lower third of esophagus: Secondary | ICD-10-CM

## 2015-07-17 DIAGNOSIS — IMO0002 Reserved for concepts with insufficient information to code with codable children: Secondary | ICD-10-CM | POA: Insufficient documentation

## 2015-07-17 NOTE — Assessment & Plan Note (Signed)
Patient received cycle 4 of his Taxol/Herceptin chemotherapy regimen on 06/24/2015.  He presented to the [facility] today to receive cycle 5 of the same regimen.  After completing both the Taxol and the Herceptin infusions today; it was noted that patient's right forearm was edematous and cool to the touch.  It is apparent that patient has developed an extravasation of either the Taxol or the Herceptin.  Since the Taxol is considered a mild irritant-advised patient to elevate his arm above the level of his heart whenever possible; and to apply cool compresses to the site.  Patient will follow the extravasation policy-and return for rechecks of his arms and 24 hours, 48 hours, and again next week for final check as well.  Patient is scheduled to return for labs and chemotherapy on 07/22/2015.  Patient is scheduled to return for follow-up visit on 07/26/2015.

## 2015-07-17 NOTE — Assessment & Plan Note (Signed)
Patient presented to the [facility] today to receive cycle 5 of his Taxol/Herceptin chemotherapy regimen.  After completing both the Taxol and the Herceptin infusions today; it was noted that patient's right forearm was edematous and cool to the touch.  Patient denies any discomfort to the right arm whatsoever.  Vital signs remained stable.  It is apparent that patient has developed an extravasation of either the Taxol or the Herceptin.  Since the Taxol is considered a mild irritant-advised patient to elevate his arm above the level of his heart whenever possible; and to apply cool compresses to the site.  Patient will follow the extravasation policy-and return for rechecks of his arms and 24 hours, 48 hours, and again next week for final check as well.  Patient was advised to call/return of electrical to the emergency department for any worsening symptoms whatsoever.

## 2015-07-17 NOTE — Progress Notes (Signed)
SYMPTOM MANAGEMENT CLINIC   HPI: Joel Miller 59 y.o. male diagnosed with esophageal cancer.  Currently undergoing Taxol/Herceptin chemotherapy regimen.   Patient presented to the [facility] today to receive cycle 5 of his Taxol/Herceptin chemotherapy regimen.  After completing both the Taxol and the Herceptin infusions today; it was noted that patient's right forearm was edematous and cool to the touch.  Patient denies any discomfort to the right arm whatsoever.  Vital signs remained stable.  It is apparent that patient has developed an extravasation of either the Taxol or the Herceptin.  Since the Taxol is considered a mild irritant-advised patient to elevate his arm above the level of his heart whenever possible; and to apply cool compresses to the site.  Patient will follow the extravasation policy-and return for rechecks of his arms and 24 hours, 48 hours, and again next week for final check as well.  Patient was advised to call/return of electrical to the emergency department for any worsening symptoms whatsoever.  HPI  ROS  Past Medical History  Diagnosis Date  . Hyperlipidemia   . Obesity     lost 80 lbs  . Hypogonadism male   . Vitamin D deficiency   . Esophageal cancer ([place]) 05/18/14    Distal  . Allergy     HEY FEVER  . GERD (gastroesophageal reflux disease)     pt feels it was related to cancer  . S/P radiation therapy 06/11/2014-07/18/2014  . Hypertension     no med in 82months due to weight loss  . Diabetes mellitus without complication ([place])     type II no med 60 days   . Pleural effusion     Past Surgical History  Procedure Laterality Date  . Colonoscopy    . 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 Complete esophagectomy N/A 09/05/2014    Procedure: TRANSHIATAL TOTAL ESOPHAGECTOMY; Cervical esophagogastrostomy;  Surgeon: [name], MD;  Location: [facility];  Service: Thoracic;  Laterality: N/A;  . Video bronchoscopy N/A 09/05/2014    Procedure: VIDEO  BRONCHOSCOPY;  Surgeon: [name], MD;  Location: [facility];  Service: Thoracic;  Laterality: N/A;  . Jejunostomy N/A 09/05/2014    Procedure: FEEDING JEJUNOSTOMY;  Surgeon: [name], MD;  Location: [facility];  Service: Thoracic;  Laterality: N/A;  . Pyloroplasty N/A 09/05/2014    Procedure: PYLOROPLASTY;  Surgeon: [name], MD;  Location: [facility];  Service: Thoracic;  Laterality: N/A;  . Wisdom tooth extraction      2010  . Chest tube insertion Right 05/24/2015    Procedure: INSERTION PLEURAL DRAINAGE CATHETER;  Surgeon: [name], MD;  Location: [facility];  Service: Thoracic;  Laterality: Right;  . Subxyphoid pericardial window N/A 07/03/2015    Procedure: SUBXYPHOID PERICARDIAL WINDOW;  Surgeon: [name], MD;  Location: [facility];  Service: Thoracic;  Laterality: N/A;  . Chest tube insertion Left 07/03/2015    Procedure: INSERTION PLEURAL DRAINAGE CATHETER;  Surgeon: [name], MD;  Location: [facility];  Service: Thoracic;  Laterality: Left;  . Cardioversion N/A 07/04/2015    Procedure: Bedside CARDIOVERSION;  Surgeon: Josue Hector, MD;  Location: [place];  Service: Cardiovascular;  Laterality: N/A;  . Esophagogastroduodenoscopy N/A 07/05/2015    Procedure: ESOPHAGOGASTRODUODENOSCOPY (EGD);  Surgeon: Gatha Mayer, MD;  Location[facility] ENDOSCOPY;  Service: Endoscopy;  Laterality: N/A;    has Carcinoma of distal third of esophagus ([place]); DVT (deep venous thrombosis) ([place]); Acute pulmonary embolism ([place]);  Pericardial effusion; Dysphagia; Anemia of chronic disease; Bilateral pleural effusion; Protein-calorie malnutrition, severe (H[facility]); PAF (paroxysmal atrial fibrillation) ([place]); and Chemotherapeutic agent or infusion extravasation on his problem list.    is allergic to ace inhibitors.    Medication List       This list is accurate as of: 07/16/15 11:59 PM.  Always use your most recent med list.               amiodarone 200 MG tablet  Commonly known as:   PACERONE  Take 1 tablet (200 mg total) by mouth daily.     feeding supplement Liqd  Take 1 Container by mouth 3 (three) times daily between meals.     oxazepam 15 MG capsule  Commonly known as:  SERAX  TAKE 1 TO 2 CAPSULES BY MOUTH 1 HOUR PRIOR TO BEDTIME FOR SLEEP IF NEEDED. *STOP TAKING ZOLPIDEM*     oxyCODONE-acetaminophen 5-325 MG tablet  Commonly known as:  PERCOCET/ROXICET  Take 0.5 tablets by mouth every 4 (four) hours as needed for moderate pain or severe pain.     prochlorperazine 10 MG tablet  Commonly known as:  COMPAZINE  Take 1 tablet (10 mg total) by mouth every 6 (six) hours as needed for nausea.     Rivaroxaban 15 & 20 MG Tbpk  Commonly known as:  XARELTO STARTER PACK  Take as directed on package: Start with one 15mg  tablet by mouth twice a day with food. On Day 22, switch to one 20mg  tablet once a day with food. Start 12 hours after last dose of Lovenox injected         PHYSICAL EXAMINATION  Oncology Vitals 07/16/2015 07/16/2015  Height 178 cm -  Weight 77.565 kg -  Weight (lbs) 171 lbs -  BMI (kg/m2) 24.54 kg/m2 -  Temp - 97.4  Pulse 79 90  Resp - 18  SpO2 - -  BSA (m2) 1.96 m2 -   BP Readings from Last 2 Encounters:  07/16/15 92/54  07/15/15 126/87    Physical Exam  Constitutional: He is oriented to person, place, and time and well-developed, well-nourished, and in no distress.  HENT:  Head: Normocephalic and atraumatic.  Eyes: Conjunctivae and EOM are normal. Pupils are equal, round, and reactive to light. Right eye exhibits no discharge. Left eye exhibits no discharge. No scleral icterus.  Neck: Normal range of motion.  Pulmonary/Chest: Effort normal. No respiratory distress.  Musculoskeletal: Normal range of motion. He exhibits edema. He exhibits no tenderness.  See skin note.   Neurological: He is alert and oriented to person, place, and time. Gait normal.  Skin: Skin is warm and dry. No rash noted. No erythema. No pallor.  Patient with edema  from the right elbow region down to his hand; with skin slightly cool to the touch.  However, there is no tenderness, erythema, drainage to the site.  Patient continues with full range of motion.  All pulses are palpable.  Psychiatric: Affect normal.  Nursing note and vitals reviewed.   LABORATORY DATA:. Appointment on 07/15/2015  Component Date Value Ref Range Status  . WBC 07/15/2015 13.0* 4.0 - 10.3 10e3/uL Final  . NEUT# 07/15/2015 11.8* 1.5 - 6.5 10e3/uL Final  . HGB 07/15/2015 12.1* 13.0 - 17.1 g/dL Final  . HCT 07/15/2015 37.8* 38.4 - 49.9 % Final  . Platelets 07/15/2015 438* 140 - 400 10e3/uL Final  . MCV 07/15/2015 89.9  79.3 - 98.0 fL Final  . MCH 07/15/2015 28.8  27.2 -  33.4 pg Final  . MCHC 07/15/2015 32.0  32.0 - 36.0 g/dL Final  . RBC 07/15/2015 4.20  4.20 - 5.82 10e6/uL Final  . RDW 07/15/2015 17.7* 11.0 - 14.6 % Final  . lymph# 07/15/2015 0.2* 0.9 - 3.3 10e3/uL Final  . MONO# 07/15/2015 0.9  0.1 - 0.9 10e3/uL Final  . Eosinophils Absolute 07/15/2015 0.0  0.0 - 0.5 10e3/uL Final  . Basophils Absolute 07/15/2015 0.1  0.0 - 0.1 10e3/uL Final  . NEUT% 07/15/2015 91.3* 39.0 - 75.0 % Final  . LYMPH% 07/15/2015 1.2* 14.0 - 49.0 % Final  . MONO% 07/15/2015 6.8  0.0 - 14.0 % Final  . EOS% 07/15/2015 0.2  0.0 - 7.0 % Final  . BASO% 07/15/2015 0.5  0.0 - 2.0 % Final   Right arm:    RADIOGRAPHIC STUDIES: No results found.  ASSESSMENT/PLAN:    Carcinoma of distal third of esophagus [facility]) Patient received cycle 4 of his Taxol/Herceptin chemotherapy regimen on 06/24/2015.  He presented to the [facility] today to receive cycle 5 of the same regimen.  After completing both the Taxol and the Herceptin infusions today; it was noted that patient's right forearm was edematous and cool to the touch.  It is apparent that patient has developed an extravasation of either the Taxol or the Herceptin.  Since the Taxol is considered a mild irritant-advised patient to elevate his arm above  the level of his heart whenever possible; and to apply cool compresses to the site.  Patient will follow the extravasation policy-and return for rechecks of his arms and 24 hours, 48 hours, and again next week for final check as well.  Patient is scheduled to return for labs and chemotherapy on 07/22/2015.  Patient is scheduled to return for follow-up visit on 07/26/2015.  Chemotherapeutic agent or infusion extravasation Patient presented to the [facility] today to receive cycle 5 of his Taxol/Herceptin chemotherapy regimen.  After completing both the Taxol and the Herceptin infusions today; it was noted that patient's right forearm was edematous and cool to the touch.  Patient denies any discomfort to the right arm whatsoever.  Vital signs remained stable.  It is apparent that patient has developed an extravasation of either the Taxol or the Herceptin.  Since the Taxol is considered a mild irritant-advised patient to elevate his arm above the level of his heart whenever possible; and to apply cool compresses to the site.  Patient will follow the extravasation policy-and return for rechecks of his arms and 24 hours, 48 hours, and again next week for final check as well.  Patient was advised to call/return of electrical to the emergency department for any worsening symptoms whatsoever.  Patient stated understanding of all instructions; and was in agreement with this plan of care. The patient knows to call the clinic with any problems, questions or concerns.   This was a shared visit with Dr. Benay Spice today.  Total time spent with patient was 25 minutes;  with greater than 75 percent of that time spent in face to face counseling regarding patient's symptoms,  and coordination of care and follow up.  Disclaimer:This dictation was prepared with Dragon/digital dictation along with [employer]. Any transcriptional errors that result from this process are unintentional.  Drue Second,  NP 07/17/2015   This was a shared visit with Drue Second.  I evaluated the rt. Arm in infusion room today after apparent infiltration.  Julieanne Manson, MD

## 2015-07-18 ENCOUNTER — Ambulatory Visit (HOSPITAL_BASED_OUTPATIENT_CLINIC_OR_DEPARTMENT_OTHER): Payer: Self-pay

## 2015-07-18 DIAGNOSIS — R2231 Localized swelling, mass and lump, right upper limb: Secondary | ICD-10-CM

## 2015-07-18 NOTE — Progress Notes (Signed)
Assessment completed on pt right arm s/p extravasation. Pt is right hand dominant.  Forarm circumference is 8 1/4 inch, significant decrease from initial 9 3/4 inches. Pt denies any numbness, tingling or pain. Skin integrity remains normal and intact. Edema resolved in forearm, limited to right hand. Minimal restriction in range of motion of fingers. All pulses palpable, normal cap refill. Pt complains of continued wrist drop in right hand interfering with function, pt states this has been an on-going concern and Dr. Benay Spice was made aware at previous visit.   Pt will continue to elevate Right hand and use cold compresses. Pt will return for evaluation 07/18/15.

## 2015-07-19 NOTE — Progress Notes (Signed)
Assessment completed on pt right arm s/p extravasation 07/18/15. Pt is right hand dominant.  Forearm circumference is 7 1/2 inch, significant decrease from initial 9 3/4 inches. Pt denies any numbness, tingling or pain. Skin integrity remains normal and intact. Edema resolved in forearm, and right hand. Minimal restriction in range of motion of fingers. All pulses palpable, normal cap refill. Pt complains of continued wrist drop in right hand interfering with function, pt states this has been an on-going concern and Dr. Benay Spice was made aware at previous visit. Neuro/PT consult has been established for early next week. Pt is aware of this appt.   Pt will continue to elevate Right hand and use cold compresses. Pt states he feels the effects of Herceptin much sooner than usual. He is feeling "draggy and fatigued, not much appetite." Emphasized need to stay hydrated. Pt states he understands to call the office with any concern. Offered IVF to pt tomorrow if he felt dehydrated. Pt declines a concern today.   Pt will return for evaluation 07/22/15 with Dr. Benay Spice but knows to call the office in the interm.

## 2015-07-21 ENCOUNTER — Other Ambulatory Visit: Payer: Self-pay | Admitting: Oncology

## 2015-07-22 ENCOUNTER — Other Ambulatory Visit: Payer: Self-pay | Admitting: *Deleted

## 2015-07-22 ENCOUNTER — Other Ambulatory Visit (HOSPITAL_BASED_OUTPATIENT_CLINIC_OR_DEPARTMENT_OTHER): Payer: BLUE CROSS/BLUE SHIELD

## 2015-07-22 ENCOUNTER — Other Ambulatory Visit: Payer: BLUE CROSS/BLUE SHIELD

## 2015-07-22 ENCOUNTER — Ambulatory Visit (HOSPITAL_BASED_OUTPATIENT_CLINIC_OR_DEPARTMENT_OTHER): Payer: BLUE CROSS/BLUE SHIELD

## 2015-07-22 VITALS — BP 107/73 | HR 93 | Temp 97.8°F | Resp 18

## 2015-07-22 DIAGNOSIS — Z5112 Encounter for antineoplastic immunotherapy: Secondary | ICD-10-CM

## 2015-07-22 DIAGNOSIS — Z5111 Encounter for antineoplastic chemotherapy: Secondary | ICD-10-CM | POA: Diagnosis not present

## 2015-07-22 DIAGNOSIS — C155 Malignant neoplasm of lower third of esophagus: Secondary | ICD-10-CM

## 2015-07-22 LAB — COMPREHENSIVE METABOLIC PANEL (CC13)
ALT: 9 U/L (ref 0–55)
ANION GAP: 9 meq/L (ref 3–11)
AST: 13 U/L (ref 5–34)
Albumin: 2.2 g/dL — ABNORMAL LOW (ref 3.5–5.0)
Alkaline Phosphatase: 90 U/L (ref 40–150)
BILIRUBIN TOTAL: 0.41 mg/dL (ref 0.20–1.20)
BUN: 11.8 mg/dL (ref 7.0–26.0)
CHLORIDE: 99 meq/L (ref 98–109)
CO2: 26 meq/L (ref 22–29)
Calcium: 9 mg/dL (ref 8.4–10.4)
Creatinine: 0.7 mg/dL (ref 0.7–1.3)
GLUCOSE: 99 mg/dL (ref 70–140)
POTASSIUM: 4.2 meq/L (ref 3.5–5.1)
SODIUM: 134 meq/L — AB (ref 136–145)
Total Protein: 5.7 g/dL — ABNORMAL LOW (ref 6.4–8.3)

## 2015-07-22 LAB — CBC WITH DIFFERENTIAL/PLATELET
BASO%: 0.6 % (ref 0.0–2.0)
Basophils Absolute: 0.1 10*3/uL (ref 0.0–0.1)
EOS%: 0.4 % (ref 0.0–7.0)
Eosinophils Absolute: 0 10*3/uL (ref 0.0–0.5)
HCT: 33.8 % — ABNORMAL LOW (ref 38.4–49.9)
HGB: 10.9 g/dL — ABNORMAL LOW (ref 13.0–17.1)
LYMPH#: 0.2 10*3/uL — AB (ref 0.9–3.3)
LYMPH%: 1.8 % — AB (ref 14.0–49.0)
MCH: 28.6 pg (ref 27.2–33.4)
MCHC: 32.3 g/dL (ref 32.0–36.0)
MCV: 88.6 fL (ref 79.3–98.0)
MONO#: 0.2 10*3/uL (ref 0.1–0.9)
MONO%: 2.3 % (ref 0.0–14.0)
NEUT#: 8.5 10*3/uL — ABNORMAL HIGH (ref 1.5–6.5)
NEUT%: 94.9 % — AB (ref 39.0–75.0)
PLATELETS: 320 10*3/uL (ref 140–400)
RBC: 3.82 10*6/uL — AB (ref 4.20–5.82)
RDW: 17.5 % — ABNORMAL HIGH (ref 11.0–14.6)
WBC: 8.9 10*3/uL (ref 4.0–10.3)

## 2015-07-22 MED ORDER — FAMOTIDINE IN NACL 20-0.9 MG/50ML-% IV SOLN
INTRAVENOUS | Status: AC
  Filled 2015-07-22: qty 50

## 2015-07-22 MED ORDER — PROCHLORPERAZINE MALEATE 10 MG PO TABS
10.0000 mg | ORAL_TABLET | Freq: Once | ORAL | Status: AC
  Administered 2015-07-22: 10 mg via ORAL

## 2015-07-22 MED ORDER — ACETAMINOPHEN 325 MG PO TABS
650.0000 mg | ORAL_TABLET | Freq: Once | ORAL | Status: AC
  Administered 2015-07-22: 650 mg via ORAL

## 2015-07-22 MED ORDER — FAMOTIDINE IN NACL 20-0.9 MG/50ML-% IV SOLN
20.0000 mg | Freq: Once | INTRAVENOUS | Status: AC
  Administered 2015-07-22: 20 mg via INTRAVENOUS

## 2015-07-22 MED ORDER — PROCHLORPERAZINE MALEATE 10 MG PO TABS
ORAL_TABLET | ORAL | Status: AC
  Filled 2015-07-22: qty 1

## 2015-07-22 MED ORDER — SODIUM CHLORIDE 0.9 % IV SOLN
Freq: Once | INTRAVENOUS | Status: AC
  Administered 2015-07-22: 14:00:00 via INTRAVENOUS

## 2015-07-22 MED ORDER — SODIUM CHLORIDE 0.9 % IV SOLN
Freq: Once | INTRAVENOUS | Status: AC
  Administered 2015-07-22: 14:00:00 via INTRAVENOUS
  Filled 2015-07-22: qty 1

## 2015-07-22 MED ORDER — DIPHENHYDRAMINE HCL 50 MG/ML IJ SOLN
25.0000 mg | Freq: Once | INTRAMUSCULAR | Status: AC
Start: 2015-07-22 — End: 2015-07-22
  Administered 2015-07-22: 25 mg via INTRAVENOUS

## 2015-07-22 MED ORDER — SODIUM CHLORIDE 0.9 % IV SOLN
2.0000 mg/kg | Freq: Once | INTRAVENOUS | Status: AC
  Administered 2015-07-22: 147 mg via INTRAVENOUS
  Filled 2015-07-22: qty 7

## 2015-07-22 MED ORDER — SODIUM CHLORIDE 0.9 % IV SOLN
80.0000 mg/m2 | Freq: Once | INTRAVENOUS | Status: AC
  Administered 2015-07-22: 156 mg via INTRAVENOUS
  Filled 2015-07-22: qty 26

## 2015-07-22 MED ORDER — ACETAMINOPHEN 325 MG PO TABS
ORAL_TABLET | ORAL | Status: AC
  Filled 2015-07-22: qty 2

## 2015-07-22 MED ORDER — DIPHENHYDRAMINE HCL 50 MG/ML IJ SOLN
INTRAMUSCULAR | Status: AC
  Filled 2015-07-22: qty 1

## 2015-07-22 NOTE — Progress Notes (Signed)
RT arm Circumference 8.5 inches. No edema or redness noted. Pt states that he has " no pain and it has been normal since 2nd day post infusion."  All pulses palpable and capillary refill less than 2 seconds. MD desk nurse Lavella Lemons RN) notified and Remi Deter LPN notified and over to assess also.

## 2015-07-22 NOTE — Patient Instructions (Signed)
[facility] Discharge Instructions for Patients Receiving Chemotherapy  Today you received the following chemotherapy agents: Herceptin and Taxol.  To help prevent nausea and vomiting after your treatment, we encourage you to take your nausea medication as directed.   If you develop nausea and vomiting that is not controlled by your nausea medication, call the clinic.   BELOW ARE SYMPTOMS THAT SHOULD BE REPORTED IMMEDIATELY:  *FEVER GREATER THAN 100.5 F  *CHILLS WITH OR WITHOUT FEVER  NAUSEA AND VOMITING THAT IS NOT CONTROLLED WITH YOUR NAUSEA MEDICATION  *UNUSUAL SHORTNESS OF BREATH  *UNUSUAL BRUISING OR BLEEDING  TENDERNESS IN MOUTH AND THROAT WITH OR WITHOUT PRESENCE OF ULCERS  *URINARY PROBLEMS  *BOWEL PROBLEMS  UNUSUAL RASH Items with * indicate a potential emergency and should be followed up as soon as possible.  Feel free to call the clinic you have any questions or concerns. The clinic phone number is [phone].  Please show the CHEMO ALERT CARD at check-in to the Emergency Department and triage nurse.   

## 2015-07-23 ENCOUNTER — Ambulatory Visit: Payer: BLUE CROSS/BLUE SHIELD | Attending: Oncology | Admitting: Occupational Therapy

## 2015-07-23 ENCOUNTER — Other Ambulatory Visit: Payer: Self-pay | Admitting: *Deleted

## 2015-07-23 DIAGNOSIS — C155 Malignant neoplasm of lower third of esophagus: Secondary | ICD-10-CM

## 2015-07-23 DIAGNOSIS — R5381 Other malaise: Secondary | ICD-10-CM | POA: Diagnosis present

## 2015-07-23 DIAGNOSIS — M6281 Muscle weakness (generalized): Secondary | ICD-10-CM | POA: Insufficient documentation

## 2015-07-23 DIAGNOSIS — M6289 Other specified disorders of muscle: Secondary | ICD-10-CM | POA: Diagnosis not present

## 2015-07-23 DIAGNOSIS — R29898 Other symptoms and signs involving the musculoskeletal system: Secondary | ICD-10-CM

## 2015-07-23 DIAGNOSIS — R208 Other disturbances of skin sensation: Secondary | ICD-10-CM | POA: Insufficient documentation

## 2015-07-23 DIAGNOSIS — R209 Unspecified disturbances of skin sensation: Secondary | ICD-10-CM

## 2015-07-23 MED ORDER — OXYCODONE-ACETAMINOPHEN 5-325 MG PO TABS
0.5000 | ORAL_TABLET | ORAL | Status: DC | PRN
Start: 2015-07-23 — End: 2015-07-26

## 2015-07-23 NOTE — Telephone Encounter (Signed)
Notified pt that re-fill request for Percocet is ready for p/u.  Pt verbalized understanding.

## 2015-07-24 ENCOUNTER — Encounter: Payer: Self-pay | Admitting: Occupational Therapy

## 2015-07-24 NOTE — Patient Instructions (Signed)
Splint Instructions (Positioning / Resting)  Splint is used to hold and protect right wrist in a neutral position. Wear splint as tolerated - splint is used to protect your wrist from getting injured.  It may be helpful to wear for several hours at a time during the day.   You can wear the splint at night if you find it is helpful and not disruptive to your sleep.  Wash splint as needed in cool soapy water. Dry thoroughly before use. If areas consistently become red and painful, note these and discontinue use. Make appointment to have occupational therapist adjust or provide custom splint.  Copyright  VHI. All rights reserved.

## 2015-07-24 NOTE — Therapy (Signed)
[facility] [address] [place], [place], [address] Phone: [phone]   Fax:  [phone]  Occupational Therapy Evaluation  Patient Details  Name: Joel Miller MRN: [id number] Date of Birth: 04-26-1956 Referring Provider: [name]  Encounter Date: 07/23/2015      OT End of Session - 07/24/15 1053    Visit Number 1   Number of Visits 17   Date for OT Re-Evaluation 08/23/15   Authorization Type BCBS   Authorization - Number of Visits 30   OT Start Time [id number]   OT Stop Time 1530   OT Time Calculation (min) 43 min   Activity Tolerance Patient tolerated treatment well   Behavior During Therapy [facility] for tasks assessed/performed      Past Medical History  Diagnosis Date  . Hyperlipidemia   . Obesity     lost 80 lbs  . Hypogonadism male   . Vitamin D deficiency   . 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 Allergy     HEY FEVER  . GERD (gastroesophageal reflux disease)     pt feels it was related to cancer  . S/P radiation therapy 06/11/2014-07/18/2014  . Hypertension     no med in 33months due to weight loss  . Diabetes mellitus without complication ([place])     type II no med 60 days   . Pleural effusion     Past Surgical History  Procedure Laterality Date  . Colonoscopy    . 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 Complete esophagectomy N/A 09/05/2014    Procedure: TRANSHIATAL TOTAL ESOPHAGECTOMY; Cervical esophagogastrostomy;  Surgeon: [name], MD;  Location: [place];  Service: Thoracic;  Laterality: N/A;  . Video bronchoscopy N/A 09/05/2014    Procedure: VIDEO BRONCHOSCOPY;  Surgeon: [name], MD;  Location: [place];  Service: Thoracic;  Laterality: N/A;  . Jejunostomy N/A 09/05/2014    Procedure: FEEDING JEJUNOSTOMY;  Surgeon: [name], MD;  Location: [place];  Service: Thoracic;  Laterality: N/A;  . Pyloroplasty N/A 09/05/2014    Procedure: PYLOROPLASTY;  Surgeon: [name], MD;  Location: [place];  Service: Thoracic;  Laterality: N/A;  . Wisdom tooth extraction      2010  . Chest tube insertion Right 05/24/2015    Procedure: INSERTION PLEURAL DRAINAGE CATHETER;  Surgeon: [name], MD;  Location: [facility];  Service: Thoracic;  Laterality: Right;  . Subxyphoid pericardial window N/A 07/03/2015    Procedure: SUBXYPHOID PERICARDIAL WINDOW;  Surgeon: [name], MD;  Location: [facility];  Service: Thoracic;  Laterality: N/A;  . Chest tube insertion Left 07/03/2015    Procedure: INSERTION PLEURAL DRAINAGE CATHETER;  Surgeon: [name], MD;  Location: [place];  Service: Thoracic;  Laterality: Left;  . Cardioversion N/A 07/04/2015    Procedure: Bedside CARDIOVERSION;  Surgeon: Josue Hector, MD;  Location: [facility];  Service: Cardiovascular;  Laterality: N/A;  . Esophagogastroduodenoscopy N/A 07/05/2015    Procedure: ESOPHAGOGASTRODUODENOSCOPY (EGD);  Surgeon: Gatha Mayer, MD;  Location: [facility];  Service: Endoscopy;  Laterality: N/A;    There were no vitals filed for this visit.  Visit Diagnosis:  Weakness of right hand - Plan: Ot plan of care cert/re-cert  Muscle weakness of right arm - Plan: Ot plan of care cert/re-cert  Sensory disturbance - Plan: Ot plan of care cert/re-cert  Physical deconditioning - Plan: Ot plan of care cert/re-cert  Subjective Assessment - 07/24/15 1013    Subjective  Patient indicated he had been sleeping in a recliner, and had tucked his hand under his bottom to relieve pressure, waking up with loss of motion in right wrist and hand.  Patient also indicated recent history of chemotherapy.   Pertinent History See snapshot    Limitations Decreased conditioning, endurance   Patient Stated Goals I would love to be able to work my computer mouse   Currently in Pain? No/denies   Pain Score 0-No pain           [facility] OT Assessment - 07/24/15 0001    Assessment   Diagnosis right wrist drop   Referring  Provider [name]   Onset Date 07/07/15   Precautions   Precautions Other (comment)   Precaution Comments Drains in right/left lung   Restrictions   Weight Bearing Restrictions No   Balance Screen   Has the patient fallen in the past 6 months No   Has the patient had a decrease in activity level because of a fear of falling?  No   Is the patient reluctant to leave their home because of a fear of falling?  No   Home  Environment   Lives With Spouse   Prior Function   Level of Independence Independent;Independent with basic ADLs;Independent with gait   Vocation Requirements Worked for [employer] - new product, new business, new strategy responsibilities.  Worked for this company for 34 years, working small amount from home - unable to tolerate 3-4 hours at once to enable him to go into office.   ADL   Upper Body Dressing Minimal assistance   Lower Body Dressing Minimal assistance   ADL comments Patient is adapting technique to try to use right hand as assist.  Patient unable to don/doff front opening jacket without assistance.     IADL   Financial Management Requires assistance  Patient unable to type, or use computer mouse at this time.    Mobility   Mobility Status Independent   Mobility Status Comments no device.  Needs rest break half way up the stairs to his bedroom.   Written Expression   Dominant Hand Right   Vision - History   Baseline Vision No visual deficits   Additional Comments Patient reports no changes   Vision Assessment   Eye Alignment Within Functional Limits   Activity Tolerance   Activity Tolerance Tolerates 10-20 min activity with muiltiple rests   Activity Tolerance Comments Patient has had recent breathing problems and was admitted for further work up - atient with history of PE, DVT, esophageal cancer.  Patient is working to build activity tolerance.  Currently can ride recumbent bike/eliptical 4-5 min, and travel 1/2 way upstairs to bedroom  without rest break.     Cognition   Overall Cognitive Status Within Functional Limits for tasks assessed   Observation/Other Assessments   Observations Patient is thin, pale - reports loss of 135 lbs over past year.  Working to increase nutritional intake    Sensation   Light Touch Appears Intact   Hot/Cold Appears Intact   Proprioception Appears Intact   Additional Comments Wears glove on right hand due to cold sensation - circulation   Coordination   Gross Motor Movements are Fluid and Coordinated Yes   Fine Motor Movements are Fluid and Coordinated No   Perception   Perception Within Functional Limits   Praxis   Praxis Intact   ROM / Strength  AROM / PROM / Strength AROM;Strength   AROM   Overall AROM  Deficits   AROM Assessment Site Wrist;Finger;Thumb   Right/Left Wrist Right   Right Wrist Extension 0 Degrees   Right/Left Finger Right   Right Composite Finger Extension 25%   Right Composite Finger Flexion --  Lacks MCP extension, has PIP, DIP extension   Right/Left Thumb Right   Strength   Overall Strength Deficits   Overall Strength Comments --   Strength Assessment Site Shoulder;Elbow;Hand;Wrist   Right/Left Shoulder Right;Left   Right Shoulder Flexion 4/5   Right Shoulder Extension 4/5   Right Shoulder ABduction 4/5   Left Shoulder Flexion 4+/5   Left Shoulder Extension --   Left Shoulder ABduction 4+/5   Right Elbow Flexion 4/5   Right Elbow Extension 4/5   Left Elbow Flexion 4+/5   Left Elbow Extension --   Right Wrist Flexion 4/5   Right Wrist Extension 0/5   Left Wrist Flexion 4+/5   Left Wrist Extension 4+/5   Hand Function   Right Hand Gross Grasp Impaired   Right Hand Grip (lbs) 20lbs   Right Hand Lateral Pinch 7 lbs   Left Hand Gross Grasp Functional   Left Hand Grip (lbs) 55   Left Hand Lateral Pinch 15 lbs                         OT Education - 07/24/15 1052    Education provided Yes   Education Details thumb spica brace  to protect wrist and thumb - wearing schedule as tolerated   Person(s) Educated Patient   Methods Explanation   Comprehension Verbalized understanding;Returned demonstration          OT Short Term Goals - 07/24/15 1126    OT SHORT TERM GOAL #1   Title Patient will be independent with HEP   Time 4   Period Weeks   Status New   OT SHORT TERM GOAL #2   Title Patient will be able to effectively don/doff wrist/hand orthosis, and demonstrate undersatnding of wearing schedule   Time 4   Period Weeks   Status New   OT SHORT TERM GOAL #3   Title Patient will demonstrate increase by 5 lb to right gross grasp strength to aide with functional grasp   Baseline 20 lbs   Time 4   Period Weeks   Status New   OT SHORT TERM GOAL #4   Title Patient will increase lateral pinch strength in right hand by 2 lbs to increase ability to pick up and hold small objects in dominant hand   Baseline 7 lbs   Time 4   Period Weeks   Status New           OT Long Term Goals - 07/24/15 1130    OT LONG TERM GOAL #1   Title Patient will be independent with upgraded HEP   Time 8   Period Weeks   Status New   OT LONG TERM GOAL #2   Title Patient will demonstrate 10 lb increase form baseline gross grasp with right hand   Baseline 20 lb   Time 8   Period Weeks   Status New   OT LONG TERM GOAL #3   Title Patient will demonstrate 5 lb increase from baseline for lateral pinch in right hand   Baseline 7 lbs   Time 8   Period Weeks   Status New   OT LONG  TERM GOAL #4   Title Patient will demonstrate effective technique to tie shoes, button shirt/ pants, buckle belt with either bimanual technique or effective adaptation   OT LONG TERM GOAL #5   Title Patient will demonstrate effective computer  mouse use with right UE   Time 8   Period Weeks   Status New   Long Term Additional Goals   Additional Long Term Goals Yes   OT LONG TERM GOAL #6   Title Patient will don/ doff front opening shirt or jacket  without assistance in less than one minute (excludes zippers and buttons)   Time 8   Period Weeks   Status New               Plan - 07/24/15 1058    Clinical Impression Statement Patient presents as a 59 year old male with significant medical history of esophageal cancer with both surgical and chemo intervention, PE, DVT now with sudden onset right wrist drop developing 2 weeks prior.  Patient with decreased right (dominant) wrist extension, thumb extension, and composite MCP extension which limits his functional use of this extremity for ADL/ IADL/Voacational activities, patient also deconditioned after recent hospitalization, also limiting activity level daily.  Patient will benefit from skilled OT intervention to address positioning to protect right wrist and hand, splinting to promote increased functional use of right wrist/digits, neuromuscular reeducation to address returning muscle activation in distal right UE, exploration of adaptive equipment and techniques to maximize independence with ADL/IADL, and  improve overall conditioning with individualized exercise /activity program.     Pt will benefit from skilled therapeutic intervention in order to improve on the following deficits (Retired) Decreased activity tolerance;Decreased knowledge of use of DME;Decreased coordination;Decreased range of motion;Decreased strength;Impaired UE functional use;Impaired sensation   Rehab Potential Good   OT Frequency 2x / week   OT Duration 8 weeks   OT Treatment/Interventions Self-care/ADL training;Electrical Stimulation;Fluidtherapy;Moist Heat;Therapeutic exercise;Neuromuscular education;DME and/or AE instruction;Manual Therapy;Splinting;Patient/family education;Therapeutic exercises;Therapeutic activities;Ultrasound;Biofeedback   Plan Patient may benefit from an extension assist splint.  Initiate HEP   Recommended Other Services Neurology   Consulted and Agree with Plan of Care Patient         Problem List Patient Active Problem List   Diagnosis Date Noted  . Chemotherapeutic agent or infusion extravasation 07/17/2015  . PAF (paroxysmal atrial fibrillation) ([place]) 07/03/2015  . Anemia of chronic disease 07/02/2015  . Bilateral pleural effusion 07/02/2015  . Protein-calorie malnutrition, severe ([place]) 07/02/2015  . DVT (deep venous thrombosis) ([place]) 07/01/2015  . Acute pulmonary embolism ([place]) 07/01/2015  . Pericardial effusion 07/01/2015  . Dysphagia 07/01/2015  . Carcinoma of distal third of esophagus ([place]) 05/28/2014    Mariah Milling, OTR/L 07/24/2015, 11:41 AM  [facility] [address] [place], [place], [address] Phone: [phone]   Fax:  [phone]  Name: Joel Miller MRN: [id number] Date of Birth: 1955-09-11

## 2015-07-26 ENCOUNTER — Ambulatory Visit: Payer: BLUE CROSS/BLUE SHIELD | Admitting: Occupational Therapy

## 2015-07-26 ENCOUNTER — Telehealth: Payer: Self-pay | Admitting: Oncology

## 2015-07-26 ENCOUNTER — Ambulatory Visit (HOSPITAL_BASED_OUTPATIENT_CLINIC_OR_DEPARTMENT_OTHER): Payer: BLUE CROSS/BLUE SHIELD | Admitting: Oncology

## 2015-07-26 ENCOUNTER — Telehealth: Payer: Self-pay | Admitting: Occupational Therapy

## 2015-07-26 ENCOUNTER — Other Ambulatory Visit: Payer: Self-pay | Admitting: Oncology

## 2015-07-26 VITALS — BP 109/63 | HR 101 | Temp 97.5°F | Resp 18 | Ht 70.0 in | Wt 161.9 lb

## 2015-07-26 DIAGNOSIS — R29898 Other symptoms and signs involving the musculoskeletal system: Secondary | ICD-10-CM

## 2015-07-26 DIAGNOSIS — M6281 Muscle weakness (generalized): Secondary | ICD-10-CM

## 2015-07-26 DIAGNOSIS — C155 Malignant neoplasm of lower third of esophagus: Secondary | ICD-10-CM

## 2015-07-26 DIAGNOSIS — M6289 Other specified disorders of muscle: Secondary | ICD-10-CM | POA: Diagnosis not present

## 2015-07-26 DIAGNOSIS — R209 Unspecified disturbances of skin sensation: Secondary | ICD-10-CM

## 2015-07-26 MED ORDER — LORAZEPAM 0.5 MG PO TABS: 0.5000 mg | ORAL_TABLET | Freq: Four times a day (QID) | ORAL | Status: DC | PRN

## 2015-07-26 MED ORDER — OXYCODONE-ACETAMINOPHEN 5-325 MG PO TABS: 0.5000 | ORAL_TABLET | ORAL | Status: DC | PRN

## 2015-07-26 NOTE — Telephone Encounter (Signed)
Dr. Benay Spice,  Renold Don is receiving occupational therapy for his right hand and wrist weakness. He could benefit from electrical stimulation to his wrist and finger extensors. Please write an order for electrical stimulation to the right wrist,  if it would be safe for [place] proceed, given his diagnosis, and active treatment for cancer. Sincerely, Theone Murdoch, OTR/L

## 2015-07-26 NOTE — Progress Notes (Signed)
[place] OFFICE PROGRESS NOTE   Diagnosis: Esophagus cancer  INTERVAL HISTORY:   He returns as scheduled. He resumed Taxol/Herceptin 07/16/2015. He completed another treatment 07/22/2015. He had extravasation at the right lower arm IV site with treatment on 07/16/2015. The swelling has resolved. No skin breakdown. He denies pain. He complains of malaise for a few days following chemotherapy. He has intermittent nausea. The nausea is not relieved with Compazine. The right Pleurx is no longer draining a significant amount of fluid. He has approximately 150 mL per day from the left Pleurx. No dysphagia Objective:  Vital signs in last 24 hours:  Blood pressure 109/63, pulse 101, temperature 97.5 F (36.4 C), temperature source Oral, resp. rate 18, height 5' 10" (1.778 m), weight 161 lb 14.4 oz (73.437 kg), SpO2 100 %.    HEENT: No thrush or ulcers Resp: Diminished breath sounds at the right compared to the left chest. Inspiratory rub at the left upper anterior chest, no respiratory distress Cardio: Regular rate and rhythm GI: No hepatomegaly, nontender Vascular: Trace low leg/ankle edema bilaterally, no swelling of the right arm or hand Neuro: Right wristdrop    Lab Results:  Lab Results  Component Value Date   WBC 8.9 07/22/2015   HGB 10.9* 07/22/2015   HCT 33.8* 07/22/2015   MCV 88.6 07/22/2015   PLT 320 07/22/2015   NEUTROABS 8.5* 07/22/2015     Medications: I have reviewed the patient's current medications.  Assessment/Plan: 1. Adenocarcinoma the esophagus/GE junction-status post an endoscopy 05/18/2014 confirming a gastric cardia/lower esophageal mass with tumor extending proximally in the esophagus to 25 cm from the incisors  HER-2/neu amplified   Staging CT scan 05/22/2014 with indeterminate bilateral pulmonary nodules; and prominent paraesophageal, porta hepatis, and para-aortic lymph nodes   Staging PET scan 05/29/2014 confirmed  hypermetabolic soft tissue thickening at the distal third of the esophagus extending into the proximal stomach. 2 additional smaller areas of hypermetabolism or noted in the more proximal esophagus with a mildly enlarged hypermetabolic gastrohepatic node.   Initiation of radiation and concurrent Taxol/carboplatin 06/11/2014; Taxol/carboplatin completed 07/09/2014; radiation completed 07/18/2014.  Esophagectomy/jejunostomy feeding tube placement 09/05/2014 with the pathology revealing [id number] tumor, HER-2/neu amplified, positive distal resection margin   Cycle 1 FOLFOX 11/01/2014  Cycle 2 FOLFOX 11/15/2014  Cycle 3 FOLFOX 11/29/2014  Cycle 4 FOLFOX 12/13/2014  Cycle 5 FOLFOX 12/27/2014  Cycle 6 FOLFOX 01/17/2015  CTs of the chest, abdomen, and pelvis on 02/01/2015-negative for recurrent esophagus cancer  CT of the chest 05/14/2015 revealed a large right pleural effusion,small left effusion, left lung nodules, mediastinal lymphadenopathy, and a left adrenal metastasis  Placement of a right Pleurx catheter 05/24/2015  Echocardiogram 05/31/2015-LVEF 60-65%  Cycle 1 Taxol/Herceptin 06/04/2015  Cycle 2 Taxol/Herceptin 06/10/2015  Cycle 3 Taxol/Herceptin 06/17/2015  Cycle 4 Taxol/Herceptin 06/24/2015  Cycle 5 Taxol/Herceptin 07/16/2015  Cycle 6 Taxol/Herceptin 07/22/2015  2. History of anorexia/weight loss 3. Diabetes  4. Hypertension  5. Hyperlipidemia  6. Back pain-potentially related to chronic spine disease versus metastases. He continues Percocet as needed 7. Solid/liquid dysphasia-esophagram 07/01/2015 confirmed a stricture near the pylorus, upper endoscopy 07/05/2015 with no evidence of a stricture and no malignancy seen 8. Pulmonary embolism confirmed on a CT 07/01/2015-Lovenox initiated, to begin Xarelto 07/10/2015 9. Pericardial effusion and progressive left pleural effusion on the CT 07/01/2015-status post a pericardial window and placement of a left Pleurx  07/03/2015, negative pericardium biopsy and negative pericardial/left pleural fluid cytology 10. Right wristdrop-most likely secondary to peripheral nerve compression  from sleeping in a recliner, now wearing a brace and followed in occupational therapy  Disposition:  Mr. Arquette appears stable. He will try Ativan for nausea. He will follow-up with Dr. Servando Snare to consider removal of the Pleurx catheters.  We discussed the indication for continuing Taxol/Herceptin. His overall performance status has improved since beginning this regimen. The plan is to complete 2 more weekly treatments beginning 07/29/2015. He will then have a treatment break. If he is stable he will continue Taxol/Herceptin with the plan to obtain a restaging CT toward the end of December.  Betsy Coder, MD  07/26/2015  3:19 PM

## 2015-07-26 NOTE — Telephone Encounter (Signed)
Gave and printed appt sched and avs fo rpt; for NOV  °

## 2015-07-26 NOTE — Therapy (Signed)
[facility] [address] [place], [place], [address] Phone: [phone]   Fax:  [phone]  Occupational Therapy Treatment  Patient Details  Name: Joel Miller MRN: [id number] Date of Birth: 31-Oct-1955 Referring Provider: [name]  Encounter Date: 07/26/2015      OT End of Session - 07/26/15 1559    Visit Number 2   Date for OT Re-Evaluation 08/23/15   Authorization Type BCBS   OT Start Time 1319   OT Stop Time 1403   OT Time Calculation (min) 44 min   Activity Tolerance Patient tolerated treatment well   Behavior During Therapy [facility] for tasks assessed/performed      Past Medical History  Diagnosis Date  . Hyperlipidemia   . Obesity     lost 80 lbs  . Hypogonadism male   . Vitamin D deficiency   . 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 Allergy     HEY FEVER  . GERD (gastroesophageal reflux disease)     pt feels it was related to cancer  . S/P radiation therapy 06/11/2014-07/18/2014  . Hypertension     no med in 16months due to weight loss  . Diabetes mellitus without complication ([place])     type II no med 60 days   . Pleural effusion     Past Surgical History  Procedure Laterality Date  . Colonoscopy    . 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 Complete esophagectomy N/A 09/05/2014    Procedure: TRANSHIATAL TOTAL ESOPHAGECTOMY; Cervical esophagogastrostomy;  Surgeon: [name], MD;  Location: [facility];  Service: Thoracic;  Laterality: N/A;  . Video bronchoscopy N/A 09/05/2014    Procedure: VIDEO BRONCHOSCOPY;  Surgeon: [name], MD;  Location: [facility] [facility];  Service: Thoracic;  Laterality: N/A;  . Jejunostomy N/A 09/05/2014    Procedure: FEEDING JEJUNOSTOMY;  Surgeon: [name], MD;  Location: [facility];  Service: Thoracic;  Laterality: N/A;  . Pyloroplasty N/A 09/05/2014    Procedure: PYLOROPLASTY;  Surgeon: [name], MD;  Location: [facility];  Service: Thoracic;  Laterality:  N/A;  . Wisdom tooth extraction      2010  . Chest tube insertion Right 05/24/2015    Procedure: INSERTION PLEURAL DRAINAGE CATHETER;  Surgeon: [name], MD;  Location: [facility];  Service: Thoracic;  Laterality: Right;  . Subxyphoid pericardial window N/A 07/03/2015    Procedure: SUBXYPHOID PERICARDIAL WINDOW;  Surgeon: [name], MD;  Location: [facility];  Service: Thoracic;  Laterality: N/A;  . Chest tube insertion Left 07/03/2015    Procedure: INSERTION PLEURAL DRAINAGE CATHETER;  Surgeon: [name], MD;  Location: [facility];  Service: Thoracic;  Laterality: Left;  . Cardioversion N/A 07/04/2015    Procedure: Bedside CARDIOVERSION;  Surgeon: Josue Hector, MD;  Location: [facility];  Service: Cardiovascular;  Laterality: N/A;  . Esophagogastroduodenoscopy N/A 07/05/2015    Procedure: ESOPHAGOGASTRODUODENOSCOPY (EGD);  Surgeon: Gatha Mayer, MD;  Location: [facility] ENDOSCOPY;  Service: Endoscopy;  Laterality: N/A;    There were no vitals filed for this visit.  Visit Diagnosis:  Weakness of right hand  Muscle weakness of right arm  Sensory disturbance      Subjective Assessment - 07/26/15 1553    Pertinent History See snapshot    Limitations Decreased conditioning, endurance   Patient Stated Goals I would love to be able to work my computer mouse   Currently in Pain? No/denies  Treatment: Therapist initiated fabrication/ fitting of custom extension assist splint. Therapist was unable to complete due to time constraints, will plan to issue next visit.                          OT Short Term Goals - 07/24/15 1126    OT SHORT TERM GOAL #1   Title Patient will be independent with HEP   Time 4   Period Weeks   Status New   OT SHORT TERM GOAL #2   Title Patient will be able to effectively don/doff wrist/hand orthosis, and demonstrate undersatnding of wearing schedule   Time 4   Period Weeks   Status New   OT SHORT TERM GOAL #3   Title Patient  will demonstrate increase by 5 lb to right gross grasp strength to aide with functional grasp   Baseline 20 lbs   Time 4   Period Weeks   Status New   OT SHORT TERM GOAL #4   Title Patient will increase lateral pinch strength in right hand by 2 lbs to increase ability to pick up and hold small objects in dominant hand   Baseline 7 lbs   Time 4   Period Weeks   Status New           OT Long Term Goals - 07/24/15 1130    OT LONG TERM GOAL #1   Title Patient will be independent with upgraded HEP   Time 8   Period Weeks   Status New   OT LONG TERM GOAL #2   Title Patient will demonstrate 10 lb increase form baseline gross grasp with right hand   Baseline 20 lb   Time 8   Period Weeks   Status New   OT LONG TERM GOAL #3   Title Patient will demonstrate 5 lb increase from baseline for lateral pinch in right hand   Baseline 7 lbs   Time 8   Period Weeks   Status New   OT LONG TERM GOAL #4   Title Patient will demonstrate effective technique to tie shoes, button shirt/ pants, buckle belt with either bimanual technique or effective adaptation   OT LONG TERM GOAL #5   Title Patient will demonstrate effective computer  mouse use with right UE   Time 8   Period Weeks   Status New   Long Term Additional Goals   Additional Long Term Goals Yes   OT LONG TERM GOAL #6   Title Patient will don/ doff front opening shirt or jacket without assistance in less than one minute (excludes zippers and buttons)   Time 8   Period Weeks   Status New               Plan - 07/26/15 1554    Clinical Impression Statement Pt is progressing towards goals. Therapist  initated fabrication/ fitting of extension assist splint, yet did not complete due to time constraints. Plan to complete next visit   Pt will benefit from skilled therapeutic intervention in order to improve on the following deficits (Retired) Decreased activity tolerance;Decreased knowledge of use of DME;Decreased  coordination;Decreased range of motion;Decreased strength;Impaired UE functional use;Impaired sensation   Rehab Potential Good   OT Frequency 2x / week   OT Duration 8 weeks   OT Treatment/Interventions Self-care/ADL training;Electrical Stimulation;Fluidtherapy;Moist Heat;Therapeutic exercise;Neuromuscular education;DME and/or AE instruction;Manual Therapy;Splinting;Patient/family education;Therapeutic exercises;Therapeutic activities;Ultrasound;Biofeedback   Plan complete extension assist splint   Recommended Other Services  await clarification from M/D regarding use of estim   Consulted and Agree with Plan of Care Patient        Problem List Patient Active Problem List   Diagnosis Date Noted  . Chemotherapeutic agent or infusion extravasation 07/17/2015  . PAF (paroxysmal atrial fibrillation) ([place]) 07/03/2015  . Anemia of chronic disease 07/02/2015  . Bilateral pleural effusion 07/02/2015  . Protein-calorie malnutrition, severe ([place]) 07/02/2015  . DVT (deep venous thrombosis) ([place]) 07/01/2015  . Acute pulmonary embolism ([place]) 07/01/2015  . Pericardial effusion 07/01/2015  . Dysphagia 07/01/2015  . 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 [place], [place], [address] Phone: [phone]   Fax:  [phone]  Name: ARVIND SEAY MRN: [id number] Date of Birth: Sep 01, 1956

## 2015-07-28 ENCOUNTER — Other Ambulatory Visit: Payer: Self-pay | Admitting: Oncology

## 2015-07-29 ENCOUNTER — Telehealth: Payer: Self-pay | Admitting: *Deleted

## 2015-07-29 ENCOUNTER — Other Ambulatory Visit (HOSPITAL_BASED_OUTPATIENT_CLINIC_OR_DEPARTMENT_OTHER): Payer: BLUE CROSS/BLUE SHIELD

## 2015-07-29 ENCOUNTER — Ambulatory Visit (HOSPITAL_BASED_OUTPATIENT_CLINIC_OR_DEPARTMENT_OTHER): Payer: BLUE CROSS/BLUE SHIELD

## 2015-07-29 ENCOUNTER — Other Ambulatory Visit: Payer: Self-pay | Admitting: Medical Oncology

## 2015-07-29 ENCOUNTER — Other Ambulatory Visit: Payer: Self-pay | Admitting: *Deleted

## 2015-07-29 VITALS — BP 112/69 | HR 98 | Temp 97.4°F | Resp 18

## 2015-07-29 DIAGNOSIS — Z5112 Encounter for antineoplastic immunotherapy: Secondary | ICD-10-CM | POA: Diagnosis not present

## 2015-07-29 DIAGNOSIS — C155 Malignant neoplasm of lower third of esophagus: Secondary | ICD-10-CM

## 2015-07-29 DIAGNOSIS — Z5111 Encounter for antineoplastic chemotherapy: Secondary | ICD-10-CM

## 2015-07-29 DIAGNOSIS — D638 Anemia in other chronic diseases classified elsewhere: Secondary | ICD-10-CM

## 2015-07-29 LAB — CBC WITH DIFFERENTIAL/PLATELET
BASO%: 0.8 % (ref 0.0–2.0)
BASO%: 1.3 % (ref 0.0–2.0)
Basophils Absolute: 0 10*3/uL (ref 0.0–0.1)
Basophils Absolute: 0 10*3/uL (ref 0.0–0.1)
EOS ABS: 0 10*3/uL (ref 0.0–0.5)
EOS%: 1.1 % (ref 0.0–7.0)
EOS%: 1.3 % (ref 0.0–7.0)
Eosinophils Absolute: 0.1 10*3/uL (ref 0.0–0.5)
HCT: 24.1 % — ABNORMAL LOW (ref 38.4–49.9)
HCT: 29 % — ABNORMAL LOW (ref 38.4–49.9)
HEMOGLOBIN: 9.3 g/dL — AB (ref 13.0–17.1)
HGB: 7.7 g/dL — ABNORMAL LOW (ref 13.0–17.1)
LYMPH#: 0.2 10*3/uL — AB (ref 0.9–3.3)
LYMPH%: 6.4 % — AB (ref 14.0–49.0)
LYMPH%: 5.9 % — ABNORMAL LOW (ref 14.0–49.0)
MCH: 28.5 pg (ref 27.2–33.4)
MCH: 28.7 pg (ref 27.2–33.4)
MCHC: 32 g/dL (ref 32.0–36.0)
MCHC: 32.1 g/dL (ref 32.0–36.0)
MCV: 89 fL (ref 79.3–98.0)
MCV: 89.5 fL (ref 79.3–98.0)
MONO#: 0.4 10*3/uL (ref 0.1–0.9)
MONO#: 0.5 10*3/uL (ref 0.1–0.9)
MONO%: 9.4 % (ref 0.0–14.0)
MONO%: 16.4 % — AB (ref 0.0–14.0)
NEUT#: 3.9 10*3/uL (ref 1.5–6.5)
NEUT%: 82.3 % — AB (ref 39.0–75.0)
NEUT%: 75.1 % — ABNORMAL HIGH (ref 39.0–75.0)
NEUTROS ABS: 2.3 10*3/uL (ref 1.5–6.5)
NRBC: 0 % (ref 0–0)
PLATELETS: 508 10*3/uL — AB (ref 140–400)
PLATELETS: 281 10*3/uL (ref 140–400)
RBC: 2.71 10*6/uL — AB (ref 4.20–5.82)
RBC: 3.24 10*6/uL — AB (ref 4.20–5.82)
RDW: 18.4 % — ABNORMAL HIGH (ref 11.0–14.6)
RDW: 17.7 % — AB (ref 11.0–14.6)
WBC: 4.7 10*3/uL (ref 4.0–10.3)
WBC: 3.1 10*3/uL — AB (ref 4.0–10.3)
lymph#: 0.3 10*3/uL — ABNORMAL LOW (ref 0.9–3.3)

## 2015-07-29 LAB — COMPREHENSIVE METABOLIC PANEL (CC13)
ALK PHOS: 93 U/L (ref 40–150)
ALT: <9 U/L (ref 0–55)
AST: 14 U/L (ref 5–34)
Albumin: 2.5 g/dL — ABNORMAL LOW (ref 3.5–5.0)
Anion Gap: 11 mEq/L (ref 3–11)
BILIRUBIN TOTAL: 0.4 mg/dL (ref 0.20–1.20)
BUN: 16 mg/dL (ref 7.0–26.0)
CO2: 25 mEq/L (ref 22–29)
Calcium: 9.3 mg/dL (ref 8.4–10.4)
Chloride: 98 mEq/L (ref 98–109)
Creatinine: 0.8 mg/dL (ref 0.7–1.3)
GLUCOSE: 104 mg/dL (ref 70–140)
POTASSIUM: 4.6 meq/L (ref 3.5–5.1)
SODIUM: 134 meq/L — AB (ref 136–145)
Total Protein: 6.4 g/dL (ref 6.4–8.3)

## 2015-07-29 LAB — TECHNOLOGIST REVIEW

## 2015-07-29 MED ORDER — DIPHENHYDRAMINE HCL 50 MG/ML IJ SOLN
INTRAMUSCULAR | Status: AC
  Filled 2015-07-29: qty 1

## 2015-07-29 MED ORDER — TRASTUZUMAB CHEMO INJECTION 440 MG
2.0000 mg/kg | Freq: Once | INTRAVENOUS | Status: AC
  Administered 2015-07-29: 147 mg via INTRAVENOUS
  Filled 2015-07-29: qty 7

## 2015-07-29 MED ORDER — FAMOTIDINE IN NACL 20-0.9 MG/50ML-% IV SOLN
INTRAVENOUS | Status: AC
  Filled 2015-07-29: qty 50

## 2015-07-29 MED ORDER — ACETAMINOPHEN 325 MG PO TABS
ORAL_TABLET | ORAL | Status: AC
  Filled 2015-07-29: qty 2

## 2015-07-29 MED ORDER — DIPHENHYDRAMINE HCL 50 MG/ML IJ SOLN
25.0000 mg | Freq: Once | INTRAMUSCULAR | Status: AC
  Administered 2015-07-29: 25 mg via INTRAVENOUS

## 2015-07-29 MED ORDER — ACETAMINOPHEN 325 MG PO TABS
650.0000 mg | ORAL_TABLET | Freq: Once | ORAL | Status: AC
  Administered 2015-07-29: 650 mg via ORAL

## 2015-07-29 MED ORDER — OXYCODONE-ACETAMINOPHEN 5-325 MG PO TABS: 1.0000 | ORAL_TABLET | ORAL | Status: DC | PRN

## 2015-07-29 MED ORDER — SODIUM CHLORIDE 0.9 % IV SOLN
80.0000 mg/m2 | Freq: Once | INTRAVENOUS | Status: AC
  Administered 2015-07-29: 156 mg via INTRAVENOUS
  Filled 2015-07-29: qty 26

## 2015-07-29 MED ORDER — SODIUM CHLORIDE 0.9 % IV SOLN
Freq: Once | INTRAVENOUS | Status: AC
  Administered 2015-07-29: 16:00:00 via INTRAVENOUS
  Filled 2015-07-29: qty 4

## 2015-07-29 MED ORDER — SODIUM CHLORIDE 0.9 % IV SOLN
Freq: Once | INTRAVENOUS | Status: AC
  Administered 2015-07-29: 14:00:00 via INTRAVENOUS

## 2015-07-29 MED ORDER — FAMOTIDINE IN NACL 20-0.9 MG/50ML-% IV SOLN
20.0000 mg | Freq: Once | INTRAVENOUS | Status: AC
  Administered 2015-07-29: 20 mg via INTRAVENOUS

## 2015-07-29 MED ORDER — DEXAMETHASONE SODIUM PHOSPHATE 100 MG/10ML IJ SOLN: Freq: Once | INTRAMUSCULAR | Status: DC

## 2015-07-29 NOTE — Telephone Encounter (Signed)
Call from [place] in [employer]. Oxycodone instructions state half tab every 4 hours. Will need authorization to fill Rx early. Authorization provided. Instructions were changed at last visit to take 1 PO Q4hours PRN. [place] will contact pt to pick up med.

## 2015-07-29 NOTE — Telephone Encounter (Signed)
Patient called reporting a "problem with percocet prescription he received Friday.  The pharmacy won't fill it."  Observed collaborative nurse received call from pharmacy and has handled this request at [phone].

## 2015-07-29 NOTE — Patient Instructions (Addendum)
[facility] Discharge Instructions for Patients Receiving Chemotherapy  Today you received the following chemotherapy agents taxol and herceptin.   If you develop nausea and vomiting that is not controlled by your nausea medication, call the clinic.   BELOW ARE SYMPTOMS THAT SHOULD BE REPORTED IMMEDIATELY:  *FEVER GREATER THAN 100.5 F  *CHILLS WITH OR WITHOUT FEVER  NAUSEA AND VOMITING THAT IS NOT CONTROLLED WITH YOUR NAUSEA MEDICATION  *UNUSUAL SHORTNESS OF BREATH  *UNUSUAL BRUISING OR BLEEDING  TENDERNESS IN MOUTH AND THROAT WITH OR WITHOUT PRESENCE OF ULCERS  *URINARY PROBLEMS  *BOWEL PROBLEMS  UNUSUAL RASH Items with * indicate a potential emergency and should be followed up as soon as possible.  Feel free to call the clinic you have any questions or concerns. The clinic phone number is [phone].  Please show the [facility] at check-in to the Emergency Department and triage nurse.

## 2015-07-30 ENCOUNTER — Ambulatory Visit: Payer: BLUE CROSS/BLUE SHIELD | Admitting: Occupational Therapy

## 2015-07-30 DIAGNOSIS — R29898 Other symptoms and signs involving the musculoskeletal system: Secondary | ICD-10-CM

## 2015-07-30 DIAGNOSIS — M6289 Other specified disorders of muscle: Secondary | ICD-10-CM | POA: Diagnosis not present

## 2015-07-30 LAB — FUNGUS CULTURE W SMEAR: Fungal Smear: NONE SEEN

## 2015-07-30 NOTE — Therapy (Signed)
[facility] [address] [place], [place], [address] Phone: [phone]   Fax:  [phone]  Occupational Therapy Treatment  Patient Details  Name: Joel Miller MRN: [id number] Date of Birth: 11-Dec-1955 Referring Provider: [name]  Encounter Date: 07/30/2015      OT End of Session - 07/30/15 1518    Visit Number 3   Date for OT Re-Evaluation 08/23/15   Authorization Type BCBS   Authorization - Number of Visits 30   OT Start Time [id number]   OT Stop Time 1530   OT Time Calculation (min) 45 min   Activity Tolerance Patient tolerated treatment well   Behavior During Therapy [facility] for tasks assessed/performed      Past Medical History  Diagnosis Date  . Hyperlipidemia   . Obesity     lost 80 lbs  . Hypogonadism male   . Vitamin D deficiency   . 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 Allergy     HEY FEVER  . GERD (gastroesophageal reflux disease)     pt feels it was related to cancer  . S/P radiation therapy 06/11/2014-07/18/2014  . Hypertension     no med in 79months due to weight loss  . Diabetes mellitus without complication ([place])     type II no med 60 days   . Pleural effusion     Past Surgical History  Procedure Laterality Date  . Colonoscopy    . 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 Complete esophagectomy N/A 09/05/2014    Procedure: TRANSHIATAL TOTAL ESOPHAGECTOMY; Cervical esophagogastrostomy;  Surgeon: [name], MD;  Location: [facility];  Service: Thoracic;  Laterality: N/A;  . Video bronchoscopy N/A 09/05/2014    Procedure: VIDEO BRONCHOSCOPY;  Surgeon: [name], MD;  Location: [place];  Service: Thoracic;  Laterality: N/A;  . Jejunostomy N/A 09/05/2014    Procedure: FEEDING JEJUNOSTOMY;  Surgeon: [name], MD;  Location: [place];  Service: Thoracic;  Laterality: N/A;  . Pyloroplasty N/A 09/05/2014    Procedure: PYLOROPLASTY;  Surgeon: [name], MD;  Location:  [facility];  Service: Thoracic;  Laterality: N/A;  . Wisdom tooth extraction      2010  . Chest tube insertion Right 05/24/2015    Procedure: INSERTION PLEURAL DRAINAGE CATHETER;  Surgeon: [name], MD;  Location: [facility];  Service: Thoracic;  Laterality: Right;  . Subxyphoid pericardial window N/A 07/03/2015    Procedure: SUBXYPHOID PERICARDIAL WINDOW;  Surgeon: [name], MD;  Location: [facility];  Service: Thoracic;  Laterality: N/A;  . Chest tube insertion Left 07/03/2015    Procedure: INSERTION PLEURAL DRAINAGE CATHETER;  Surgeon: [name], MD;  Location: [facility];  Service: Thoracic;  Laterality: Left;  . Cardioversion N/A 07/04/2015    Procedure: Bedside CARDIOVERSION;  Surgeon: Josue Hector, MD;  Location: [place] [facility];  Service: Cardiovascular;  Laterality: N/A;  . Esophagogastroduodenoscopy N/A 07/05/2015    Procedure: ESOPHAGOGASTRODUODENOSCOPY (EGD);  Surgeon: Gatha Mayer, MD;  Location: [facility] ENDOSCOPY;  Service: Endoscopy;  Laterality: N/A;    There were no vitals filed for this visit.  Visit Diagnosis:  Weakness of right hand      Subjective Assessment - 07/30/15 1448    Subjective  Pt reports that he feels like the splint will help and that it feels comfortable.     Pertinent History See snapshot    Limitations Decreased conditioning, endurance   Patient Stated Goals  I would love to be able to work my computer mouse   Currently in Pain? No/denies                      OT Treatments/Exercises (OP) - 07/30/15 0001    ADLs   Eating Simulated eating with spoon with and without built-up grip.  Pt able to perform with min difficulty and reports no significant difference with/without built up grip.  Practiced picking up cup/clinder object with splint with incr ease.   Writing Practiced writing with built-up grip and splint with improved ease per pt.  Issued tan foam for home.   Splinting   Splinting Completed fabrication of forearm based finger extension  splint.                  OT Education - 07/30/15 1541    Education Details splint wear/care, initial HEP--see pt instructions   Person(s) Educated Patient   Methods Explanation;Demonstration   Comprehension Verbalized understanding;Returned demonstration          OT Short Term Goals - 07/24/15 1126    OT SHORT TERM GOAL #1   Title Patient will be independent with HEP   Time 4   Period Weeks   Status New   OT SHORT TERM GOAL #2   Title Patient will be able to effectively don/doff wrist/hand orthosis, and demonstrate undersatnding of wearing schedule   Time 4   Period Weeks   Status New   OT SHORT TERM GOAL #3   Title Patient will demonstrate increase by 5 lb to right gross grasp strength to aide with functional grasp   Baseline 20 lbs   Time 4   Period Weeks   Status New   OT SHORT TERM GOAL #4   Title Patient will increase lateral pinch strength in right hand by 2 lbs to increase ability to pick up and hold small objects in dominant hand   Baseline 7 lbs   Time 4   Period Weeks   Status New           OT Long Term Goals - 07/24/15 1130    OT LONG TERM GOAL #1   Title Patient will be independent with upgraded HEP   Time 8   Period Weeks   Status New   OT LONG TERM GOAL #2   Title Patient will demonstrate 10 lb increase form baseline gross grasp with right hand   Baseline 20 lb   Time 8   Period Weeks   Status New   OT LONG TERM GOAL #3   Title Patient will demonstrate 5 lb increase from baseline for lateral pinch in right hand   Baseline 7 lbs   Time 8   Period Weeks   Status New   OT LONG TERM GOAL #4   Title Patient will demonstrate effective technique to tie shoes, button shirt/ pants, buckle belt with either bimanual technique or effective adaptation   OT LONG TERM GOAL #5   Title Patient will demonstrate effective computer  mouse use with right UE   Time 8   Period Weeks   Status New   Long Term Additional Goals   Additional Long Term  Goals Yes   OT LONG TERM GOAL #6   Title Patient will don/ doff front opening shirt or jacket without assistance in less than one minute (excludes zippers and buttons)   Time 8   Period Weeks   Status New  Plan - 07/30/15 1614    Clinical Impression Statement Pt demo improved ability to pick up objects, write and use mouse with RUE using splint.     Plan check splint wear/care, functional activities with R hand   Consulted and Agree with Plan of Care Patient        Problem List Patient Active Problem List   Diagnosis Date Noted  . Chemotherapeutic agent or infusion extravasation 07/17/2015  . PAF (paroxysmal atrial fibrillation) ([place]) 07/03/2015  . Anemia of chronic disease 07/02/2015  . Bilateral pleural effusion 07/02/2015  . Protein-calorie malnutrition, severe ([place]) 07/02/2015  . DVT (deep venous thrombosis) ([place]) 07/01/2015  . Acute pulmonary embolism ([place]) 07/01/2015  . Pericardial effusion 07/01/2015  . Dysphagia 07/01/2015  . 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 [place], [place], [address] Phone: [phone]   Fax:  [phone]  Name: Joel Miller MRN: [id number] Date of Birth: 1955/10/23  Vianne Bulls, OTR/L 07/30/2015 4:16 PM

## 2015-07-30 NOTE — Patient Instructions (Addendum)
Your Splint This splint should initially be fitted by a healthcare practitioner.  The healthcare practitioner is responsible for providing wearing instructions and precautions to the patient, other healthcare practitioners and care provider involved in the patient's care.  This splint was custom made for you. Please read the following instructions to learn about wearing and caring for your splint.  Precautions Should your splint cause any of the following problems, remove the splint immediately and contact your therapist/physician.  Swelling  Severe Pain  Pressure Areas  Stiffness  Numbness  Do not wear your splint while operating machinery unless it has been fabricated for that purpose.  When To Wear Your Splint Where your splint according to your therapist/physician instructions. Wear during the Daytime.  For the first few days, check skin after wearing for 1-2hrs.  Care and Cleaning of Your Splint 1. Keep your splint away from open flames. 2. Your splint will lose its shape in temperatures over 135 degrees Farenheit, ( in car windows, near radiators, ovens or in hot water).  Never make any adjustments to your splint, if the splint needs adjusting remove it and make an appointment to see your therapist. 3. Your splint, including the cushion liner may be cleaned with soap and lukewarm water.  Do not immerse in hot water over 135 degrees Farenheit. 4. Straps may be washed with soap and water, but do not moisten the self-adhesive portion. 5. For ink or hard to remove spots use a scouring cleanser which contains chlorine.  Rinse the splint thoroughly after using chlorine cleanser.                               Wear splint, let fingers hang off edge of arm rest/edge of table.  Try to lift/straighten fingers.  Then, without splint.  Let wrist hang off edge of arm rest/edge of table.  Use your left hand to give a little support as you try to lift wrist.     Using left hand, gentle lift right wrist for stretch.  Also stretch fingers straight on table.

## 2015-08-01 LAB — FUNGUS CULTURE W SMEAR: FUNGAL SMEAR: NONE SEEN

## 2015-08-02 ENCOUNTER — Other Ambulatory Visit: Payer: Self-pay | Admitting: *Deleted

## 2015-08-02 MED ORDER — RIVAROXABAN 20 MG PO TABS: 20.0000 mg | ORAL_TABLET | Freq: Every day | ORAL | Status: DC

## 2015-08-02 NOTE — Telephone Encounter (Signed)
Received Xarelto refill request from [facility]. Confirmed with pt he wants refill from [facility]. Rx sent electronically.

## 2015-08-04 ENCOUNTER — Other Ambulatory Visit: Payer: Self-pay | Admitting: Oncology

## 2015-08-05 ENCOUNTER — Ambulatory Visit: Payer: BLUE CROSS/BLUE SHIELD

## 2015-08-05 ENCOUNTER — Other Ambulatory Visit (HOSPITAL_BASED_OUTPATIENT_CLINIC_OR_DEPARTMENT_OTHER): Payer: BLUE CROSS/BLUE SHIELD

## 2015-08-05 ENCOUNTER — Ambulatory Visit (HOSPITAL_BASED_OUTPATIENT_CLINIC_OR_DEPARTMENT_OTHER): Payer: BLUE CROSS/BLUE SHIELD | Admitting: Oncology

## 2015-08-05 ENCOUNTER — Other Ambulatory Visit: Payer: Self-pay | Admitting: *Deleted

## 2015-08-05 VITALS — BP 108/66 | HR 101 | Temp 97.8°F | Resp 18 | Ht 70.0 in | Wt 163.3 lb

## 2015-08-05 DIAGNOSIS — C155 Malignant neoplasm of lower third of esophagus: Secondary | ICD-10-CM | POA: Diagnosis not present

## 2015-08-05 DIAGNOSIS — R5381 Other malaise: Secondary | ICD-10-CM | POA: Diagnosis not present

## 2015-08-05 DIAGNOSIS — D638 Anemia in other chronic diseases classified elsewhere: Secondary | ICD-10-CM

## 2015-08-05 DIAGNOSIS — L89159 Pressure ulcer of sacral region, unspecified stage: Secondary | ICD-10-CM | POA: Diagnosis not present

## 2015-08-05 DIAGNOSIS — I2782 Chronic pulmonary embolism: Secondary | ICD-10-CM | POA: Diagnosis not present

## 2015-08-05 LAB — CBC WITH DIFFERENTIAL/PLATELET
BASO%: 0.8 % (ref 0.0–2.0)
Basophils Absolute: 0 10*3/uL (ref 0.0–0.1)
EOS%: 0.2 % (ref 0.0–7.0)
Eosinophils Absolute: 0 10*3/uL (ref 0.0–0.5)
HCT: 29.6 % — ABNORMAL LOW (ref 38.4–49.9)
HGB: 9.7 g/dL — ABNORMAL LOW (ref 13.0–17.1)
LYMPH%: 3.4 % — AB (ref 14.0–49.0)
MCH: 29.2 pg (ref 27.2–33.4)
MCHC: 32.6 g/dL (ref 32.0–36.0)
MCV: 89.6 fL (ref 79.3–98.0)
MONO#: 0.5 10*3/uL (ref 0.1–0.9)
MONO%: 9.2 % (ref 0.0–14.0)
NEUT%: 86.4 % — ABNORMAL HIGH (ref 39.0–75.0)
NEUTROS ABS: 4.9 10*3/uL (ref 1.5–6.5)
Platelets: 340 10*3/uL (ref 140–400)
RBC: 3.31 10*6/uL — AB (ref 4.20–5.82)
RDW: 19.6 % — ABNORMAL HIGH (ref 11.0–14.6)
WBC: 5.7 10*3/uL (ref 4.0–10.3)
lymph#: 0.2 10*3/uL — ABNORMAL LOW (ref 0.9–3.3)

## 2015-08-05 NOTE — Progress Notes (Signed)
[name] OFFICE PROGRESS NOTE   Diagnosis: Gastroesophageal carcinoma  INTERVAL HISTORY:   History of Joel Miller returns as scheduled. He completed another treatment with Taxol/Herceptin 07/29/2015. He complains of malaise lasting for days following chemotherapy. He has numbness in the fingers, but not in the toes. He feels "cold "in general. The right wristdrop persist. He reports essentially no drainage in the right Pleurx and between 150 and 200 mL daily from the left Pleurx. He is scheduled to see Dr. Servando Snare later this week. He feels better after taking oxycodone, but he does not have pain. He notes improvement in his energy level when he takes oxycodone.  Objective:  Vital signs in last 24 hours:  Blood pressure 108/66, pulse 101, temperature 97.8 F (36.6 C), temperature source Oral, resp. rate 18, height 5' 10"  (1.778 m), weight 163 lb 4.8 oz (74.072 kg), SpO2 100 %.    HEENT: No thrush or ulcers Resp: Distant breath sounds bilaterally, rub at the left upper anterior chest, diminished breath sounds at the right compared to the left chest Cardio: Regular rate and rhythm GI: No hepatomegaly, nontender Vascular: Trace-1+ pitting edema at the lower leg bilaterally   Skin: There is an ulcer at the upper gluteal fold and a smaller ulcer to the right of midline   Lab Results:  Lab Results  Component Value Date   WBC 5.7 08/05/2015   HGB 9.7* 08/05/2015   HCT 29.6* 08/05/2015   MCV 89.6 08/05/2015   PLT 340 08/05/2015   NEUTROABS 4.9 08/05/2015     Medications: I have reviewed the patient's current medications.  Assessment/Plan: 1. Adenocarcinoma the esophagus/GE junction-status post an endoscopy 05/18/2014 confirming a gastric cardia/lower esophageal mass with tumor extending proximally in the esophagus to 25 cm from the incisors  HER-2/neu amplified   Staging CT scan 05/22/2014 with indeterminate bilateral pulmonary nodules; and prominent  paraesophageal, porta hepatis, and para-aortic lymph nodes   Staging PET scan 05/29/2014 confirmed hypermetabolic soft tissue thickening at the distal third of the esophagus extending into the proximal stomach. 2 additional smaller areas of hypermetabolism or noted in the more proximal esophagus with a mildly enlarged hypermetabolic gastrohepatic node.   Initiation of radiation and concurrent Taxol/carboplatin 06/11/2014; Taxol/carboplatin completed 07/09/2014; radiation completed 07/18/2014.  Esophagectomy/jejunostomy feeding tube placement 09/05/2014 with the pathology revealing aypT3ypN2 tumor, HER-2/neu amplified, positive distal resection margin   Cycle 1 FOLFOX 11/01/2014  Cycle 2 FOLFOX 11/15/2014  Cycle 3 FOLFOX 11/29/2014  Cycle 4 FOLFOX 12/13/2014  Cycle 5 FOLFOX 12/27/2014  Cycle 6 FOLFOX 01/17/2015  CTs of the chest, abdomen, and pelvis on 02/01/2015-negative for recurrent esophagus cancer  CT of the chest 05/14/2015 revealed a large right pleural effusion,small left effusion, left lung nodules, mediastinal lymphadenopathy, and a left adrenal metastasis  Placement of a right Pleurx catheter 05/24/2015  Echocardiogram 05/31/2015-LVEF 60-65%  Cycle 1 Taxol/Herceptin 06/04/2015  Cycle 2 Taxol/Herceptin 06/10/2015  Cycle 3 Taxol/Herceptin 06/17/2015  Cycle 4 Taxol/Herceptin 06/24/2015  Cycle 5 Taxol/Herceptin 07/16/2015  Cycle 6 Taxol/Herceptin 07/22/2015  Cycle 7 Taxol/Herceptin 07/29/2015  2. History of anorexia/weight loss 3. Diabetes  4. Hypertension  5. Hyperlipidemia  6. Back pain-potentially related to chronic spine disease versus metastases. He continues Percocet as needed 7. Solid/liquid dysphasia-esophagram 07/01/2015 confirmed a stricture near the pylorus, upper endoscopy 07/05/2015 with no evidence of a stricture and no malignancy seen 8. Pulmonary embolism confirmed on a CT 07/01/2015-Lovenox initiated, to begin Xarelto  07/10/2015 9. Pericardial effusion and progressive left pleural effusion on the CT  07/01/2015-status post a pericardial window and placement of a left Pleurx 07/03/2015, negative pericardium biopsy and negative pericardial/left pleural fluid cytology 10. Right wristdrop-most likely secondary to peripheral nerve compression from sleeping in a recliner, now wearing a brace and followed in occupational therapy 11. Sacral decubitus ulcers   Disposition:  He appears unchanged. He has decided to take a break from chemotherapy. He feels the chemotherapy is causing profound malaise. I explained the fatigue may be related to chemotherapy or metastatic gastroesophageal carcinoma. Joel Miller will see Dr. Servando Snare later this week to evaluate the Pleurx catheters. He agrees to a follow-up appointment here in 2 weeks. We will refer him to advanced home care for management of the sacral decubiti. The plan is to obtain a restaging CT in late December. Betsy Coder, MD  08/05/2015  10:52 AM

## 2015-08-06 ENCOUNTER — Telehealth: Payer: Self-pay | Admitting: *Deleted

## 2015-08-06 NOTE — Telephone Encounter (Signed)
Patient called "I saw Dr. Benay Spice yesterday and have not heard from [place]."  This nurse will notify collaborative.  Mr. Deignan "asked for nurse to call him at [phone] when this has been done."    08-05-2015 Office note reads the following.  Skin: There is an ulcer at the upper gluteal fold and a smaller ulcer to the right of midline Disposition:   We will refer him to advanced home care for management of the sacral decubiti.

## 2015-08-06 NOTE — Telephone Encounter (Signed)
Left voice message for pt on cell # that referral was placed yesterday after he left office and [facility] will process order; will be in touch this week.  Any questions; please call office.

## 2015-08-07 DIAGNOSIS — J910 Malignant pleural effusion: Secondary | ICD-10-CM | POA: Diagnosis not present

## 2015-08-07 DIAGNOSIS — L89152 Pressure ulcer of sacral region, stage 2: Secondary | ICD-10-CM | POA: Diagnosis not present

## 2015-08-07 DIAGNOSIS — C153 Malignant neoplasm of upper third of esophagus: Secondary | ICD-10-CM | POA: Diagnosis not present

## 2015-08-07 DIAGNOSIS — R1310 Dysphagia, unspecified: Secondary | ICD-10-CM | POA: Diagnosis not present

## 2015-08-08 ENCOUNTER — Other Ambulatory Visit: Payer: Self-pay | Admitting: Cardiothoracic Surgery

## 2015-08-08 ENCOUNTER — Encounter: Payer: Self-pay | Admitting: *Deleted

## 2015-08-08 ENCOUNTER — Encounter: Payer: Self-pay | Admitting: Cardiothoracic Surgery

## 2015-08-08 ENCOUNTER — Ambulatory Visit (INDEPENDENT_AMBULATORY_CARE_PROVIDER_SITE_OTHER): Payer: Self-pay | Admitting: Cardiothoracic Surgery

## 2015-08-08 ENCOUNTER — Ambulatory Visit
Admission: RE | Admit: 2015-08-08 | Discharge: 2015-08-08 | Disposition: A | Discharge status: Home / Self Care | Payer: BLUE CROSS/BLUE SHIELD | Source: Ambulatory Visit | Attending: Cardiothoracic Surgery | Admitting: Cardiothoracic Surgery

## 2015-08-08 VITALS — BP 111/72 | HR 102 | Resp 16 | Ht 70.0 in | Wt 163.0 lb

## 2015-08-08 DIAGNOSIS — I3139 Other pericardial effusion (noninflammatory): Secondary | ICD-10-CM

## 2015-08-08 DIAGNOSIS — C155 Malignant neoplasm of lower third of esophagus: Secondary | ICD-10-CM

## 2015-08-08 DIAGNOSIS — I319 Disease of pericardium, unspecified: Secondary | ICD-10-CM

## 2015-08-08 DIAGNOSIS — Z09 Encounter for follow-up examination after completed treatment for conditions other than malignant neoplasm: Secondary | ICD-10-CM

## 2015-08-08 DIAGNOSIS — I313 Pericardial effusion (noninflammatory): Secondary | ICD-10-CM

## 2015-08-08 DIAGNOSIS — J90 Pleural effusion, not elsewhere classified: Secondary | ICD-10-CM

## 2015-08-08 NOTE — Progress Notes (Signed)
[address]       [place] [address]             [phone]      Joel Miller Medical Record [id number] Date of Birth: 05-26-1956  Referring: [name], MD Primary Care: [name], MD  Chief Complaint:   POST OP FOLLOW UP 07/03/2015 OPERATIVE REPORT PREOPERATIVE DIAGNOSES: History of esophageal cancer with recent pulmonary embolus, large pericardial effusion and increasing left pleural effusion suspected to be malignant effusion. POSTOPERATIVE DIAGNOSES: History of esophageal cancer with recent pulmonary embolus, large pericardial effusion and increasing left pleural effusion suspected to be malignant effusion. PROCEDURE: Subxiphoid pericardial window with drainage of pericardial effusion. Left PleurX catheter and drainage of left pleural effusion with ultrasound fluoroscopic guidance. 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 SURGICAL PROCEDURE: Video bronchoscopy, transhiatal total esophagectomy with cervical esophagogastrostomy, pyloroplasty, and feeding jejunostomy. 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 SURGICAL PROCEDURE: Both fluoroscopic and ultrasound guidance, placement of a right PleurX catheter, and drainage of right  pleural fluid. SURGEON: [name], MD     History of Present Illness:    Patient returns today for follow-up visit with his bilateral Pleurx catheters, the right catheter has decreased to only 50 mL a week. The left catheter still drains 150-200 mL every other day The patient was to have chemotherapy on Monday but decided not to have it. Since he feels a little better this week.    Past Medical History  Diagnosis Date  . Hyperlipidemia   . Obesity     lost 80 lbs  . Hypogonadism male   . Vitamin D deficiency   . Esophageal cancer ([facility]) 05/18/14    Distal  . Allergy     HEY FEVER  . GERD (gastroesophageal reflux disease)     pt feels it was related to cancer  . S/P radiation therapy 06/11/2014-07/18/2014  . Hypertension     no med in 65months due to weight loss  . Diabetes mellitus without complication ([facility])     type II no med 60 days   . Pleural effusion      History  Smoking status  . Former Smoker -- 0.25 packs/day for 3 years  . Types: Cigars  Smokeless tobacco  . Never Used    Comment: no cigars for one year    History  Alcohol Use  . 1.2 oz/week  . 2 Standard drinks or equivalent per week    Comment: social     Allergies  Allergen Reactions  . Ace Inhibitors     cough    Current Outpatient Prescriptions  Medication Sig Dispense Refill  . amiodarone (PACERONE) 200 MG tablet Take 1 tablet (200 mg total) by mouth daily. 30 tablet 11  . feeding supplement (BOOST / RESOURCE BREEZE) LIQD Take 1 Container by mouth 3 (three) times daily between meals.  0  . LORazepam (ATIVAN) 0.5 MG tablet Take 1 tablet (0.5 mg total) by mouth every 6 (six) hours as needed for anxiety. 60 tablet 1  . oxazepam (SERAX) 15 MG capsule TAKE 1 TO 2 CAPSULES BY MOUTH 1 HOUR PRIOR TO BEDTIME FOR SLEEP IF NEEDED. *STOP TAKING ZOLPIDEM* 60 capsule 2  . oxyCODONE-acetaminophen (PERCOCET/ROXICET) 5-325 MG tablet Take 1 tablet by mouth every 4 (four) hours as needed for moderate pain  or severe pain. 100 tablet 0  . prochlorperazine (COMPAZINE) 10 MG tablet Take 1 tablet (10 mg total) by mouth every 6 (six) hours as needed for nausea. 30 tablet 1  . rivaroxaban (XARELTO) 20 MG TABS tablet Take 1 tablet (20 mg total) by mouth daily with supper. 30 tablet 2   No current facility-administered medications for this visit.   Wt Readings from Last 3 Encounters:  08/08/15 163 lb (73.936 kg)  08/05/15 163 lb 4.8 oz (74.072 kg)  07/26/15 161 lb 14.4 oz (73.437 kg)   Physical Exam: BP 111/72 mmHg  Pulse 102  Resp 16  Ht 5\' 10"  (1.778 m)  Wt 163 lb (73.936 kg)  BMI 23.39 kg/m2  SpO2 96%  General appearance: alert, cooperative and appears stated age, but with significant cachexia Neurologic: intact Heart: regular rate and rhythm, S1, S2 normal, no murmur, click, rub or gallop Lungs: clear to auscultation on the left but decreased at the right base Abdomen: soft, non-tender; bowel sounds normal; no masses,  no organomegaly and Abdominal wound is healing well Extremities: extremities normal, atraumatic, no cyanosis or edema and Homans sign is negative, no sign of DVT Wound: Left neck incision is healing well I do not appreciate any cervical or supraclavicular adenopathy  Both Pleurx catheters are intact.  Diagnostic Studies & Laboratory data:     Recent Radiology Findings:  Dg Chest 2 View  08/08/2015  CLINICAL DATA:  Esophageal carcinoma EXAM: CHEST  2 VIEW COMPARISON:  July 11, 2015 FINDINGS: Bilateral chest drains remain in place. There are pleural effusions bilaterally, slightly larger on the right than on the left, stable. There is patchy atelectasis in both lower lung zones, stable there is a small area of infiltrate in the left lower lobe, concerning for a small focus of pneumonia. The heart size and pulmonary vascularity are normal. There is postoperative change in the lower mediastinal region which is stable. No adenopathy. No bone lesions apparent. IMPRESSION:  Suspect small focus of pneumonia left lower lobe. Persistent effusions and bibasilar atelectasis. Postoperative change in the lower mediastinum is stable. No change in cardiac silhouette. No pneumothorax or pneumomediastinum. Electronically Signed   By: Lowella Grip III M.D.   On: 08/08/2015 09:32   Dg Chest 2 View  06/13/2015   CLINICAL DATA:  History of esophageal malignancy with surgery in January of 2016, history of pleural effusions, currently asymptomatic.  EXAM: CHEST  2 VIEW  COMPARISON:  Portable chest x-ray of May 24, 2015  FINDINGS: There remains a smaller moderate-sized right pleural effusion. The small caliber right chest tube is unchanged in position at the inferior aspect of the pleural space. A small left pleural effusion has increased in size since September. There is no pneumothorax. The pulmonary interstitial markings are slightly more conspicuous today especially on the left. The heart is normal in size. The pulmonary vascularity is not engorged. The bony thorax exhibits no acute abnormality.  IMPRESSION: 1. Mildly increased pulmonary interstitial markings especially on the left since the previous study. This may reflect  interstitial edema or early interstitial pneumonia. 2. Stable small a moderate sized right pleural effusion with stable small caliber chest tube. Slight interval increase in the volume of the small left pleural effusion.   Electronically Signed   By: David  Martinique M.D.   On: 06/13/2015 14:22   Ct Angio Chest Pe W/cm &/or Wo Cm  05/14/2015   CLINICAL DATA:  Cough and shortness of breath for 1 week. History of esophageal carcinoma diagnosed in the fall of 2015.  EXAM: CT ANGIOGRAPHY CHEST WITH CONTRAST  TECHNIQUE: Multidetector CT imaging of the chest was performed using the standard protocol during bolus administration of intravenous contrast. Multiplanar CT image reconstructions and MIPs were obtained to evaluate the vascular anatomy.  CONTRAST:  80 mL OMNIPAQUE  IOHEXOL 350 MG/ML SOLN  COMPARISON:  CT chest and abdomen 01/31/2015 and PA and lateral chest 04/25/2015.  FINDINGS: No pulmonary embolus is identified. The patient has a massive right pleural effusion which has markedly increased since the prior plain films. A smaller left pleural effusion is identified. There is no pericardial effusion. Heart size is normal.  Postoperative change of esophagectomy and gastric pull-through is again seen. There is new mediastinal lymphadenopathy. Index AP window node on image 34 measures 1.7 cm in diameter. A prevascular node on image 33 is barely perceptible on the prior examination and now measures 1.0 cm short axis dimension on image 33. There is also new and extensive juxtaphrenic lymphadenopathy best seen on the right. Index node on image 79 measures 3.1 x 2.3 cm. Pleural thickening and nodularity are identified on the right most consistent with metastatic disease.  The right lung is nearly completely collapsed secondary to compressive atelectasis from the patient's effusion. A new left lower lobe pulmonary nodule image 60 measures 0.7 cm. A second new left lower lobe pulmonary nodule on image 62 measures 0.9 cm in diameter. A left upper lobe nodule on image 39 measures 0.5 cm in diameter. Several additional smaller nodules are seen in the left lung.  Imaged upper abdomen demonstrates a new left adrenal nodule measuring 2.9 x 1.7 cm on image 95. There is some haziness of the mesenteric about the pancreas which is also new since the prior examination. Imaged upper abdomen is otherwise unremarkable. No lytic or sclerotic bony lesions identified.  Review of the MIP images confirms the above findings.  IMPRESSION: Negative for pulmonary embolus.  Findings consistent with extensive new metastatic esophageal carcinoma with a very large pleural effusion on the right with pleural nodularity consistent with a malignant effusion. The effusion nearly completely compresses the left lung.  There is also new mediastinal and juxtaphrenic lymphadenopathy, left pulmonary nodules and a left adrenal nodule all most consistent with metastatic disease.  Small left pleural effusion.  Stranding of fat about the pancreas is nonspecific and could be due to pancreatitis or mesenteritis.   Electronically Signed   By: Inge Rise M.D.   On: 05/14/2015 10:58   Ct Chest W Contrast  02/01/2015   CLINICAL DATA:  Patient with distal esophageal carcinoma diagnosed 05/18/2014. Status post chemotherapy and radiation. Patient status post esophagectomy 09/05/2014.  EXAM: CT CHEST AND ABDOMEN WITHOUT CONTRAST  TECHNIQUE: Multidetector CT imaging of the chest and abdomen was performed following the standard protocol without intravenous contrast.  COMPARISON:  CT chest 08/09/2014 ; PET-CT 05/29/2014  FINDINGS: CT CHEST FINDINGS  Mediastinum/Nodes: Right anterior chest wall Port-A-Cath is present with tip terminating at the superior cavoatrial junction. No enlarged axillary, mediastinal or hilar lymphadenopathy.  Normal heart size. No pericardial effusion. Postoperative changes compatible with esophagectomy and gastric pull-through. No mediastinal fluid collections are identified.  Lungs/Pleura: Central airways are patent. Minimal scarring and or atelectasis medial right lower lobe (image 38; series 4). No large nodular or consolidative pulmonary opacity. Minimal tiny 1 mm nodules within the peripheral lungs bilaterally are unchanged from prior. No pleural effusion or pneumothorax.  Musculoskeletal: Thoracic spine degenerative changes without aggressive or acute appearing osseous lesions.  CT ABDOMEN FINDINGS  Hepatobiliary: Liver is normal in size and contour without focal hepatic lesion identified. Gallbladder is unremarkable. No intrahepatic or extrahepatic biliary ductal dilatation.  Pancreas: Unremarkable  Spleen: Unremarkable  Adrenals/Urinary Tract: Stable thickening of the bilateral adrenal glands. Kidneys enhance  symmetrically with contrast. No hydronephrosis.  Stomach/Bowel: Postoperative changes compatible with esophagectomy and gastric pull-through. No evidence for abnormal bowel wall thickening or bowel obstruction. There is nonspecific mesenteric fat stranding within the visualized upper abdomen. No free intraperitoneal air or fluid.  Vascular/Lymphatic: Normal caliber abdominal aorta. No retroperitoneal lymphadenopathy.  Other: Postsurgical changes midline abdominal soft tissues.  Musculoskeletal: Lower lumbar spine degenerative changes. No aggressive or acute appearing osseous lesions.  IMPRESSION: Patient status post interval esophagectomy and pull-through without evidence for locally recurrent or distant metastatic disease.  Nonspecific mesenteric fat stranding within the visualized upper abdomen is favored to be postoperative in etiology.   Electronically Signed   By: Lovey Newcomer M.D.   On: 02/01/2015 08:34       Recent Lab Findings: Lab Results  Component Value Date   WBC 5.7 08/05/2015   HGB 9.7* 08/05/2015   HCT 29.6* 08/05/2015   PLT 340 08/05/2015   GLUCOSE 104 07/29/2015   CHOL 134 04/25/2015   TRIG 72 04/25/2015   HDL 41 04/25/2015   LDLCALC 79 04/25/2015   ALT <9 07/29/2015   AST 14 07/29/2015   NA 134* 07/29/2015   K 4.6 07/29/2015   CL 99* 07/05/2015   CREATININE 0.8 07/29/2015   BUN 16.0 07/29/2015   CO2 25 07/29/2015   TSH 1.106 04/25/2015   INR 1.62* 07/02/2015   HGBA1C 5.9* 04/25/2015   In office today the right Pleurx catheter was prepped with Betadine 1% lidocaine infiltrated around the cuff and the Pleurx catheter was moved in its entirety without difficulty.   Assessment / Plan:      Adenocarcinoma the esophagus/GE junction-status post an transhiatal total esophagectomy for a stage IIIB adenocarcinoma, now with what appears to be a malignant right pleural effusion and CT evidence of more diffuse disease. Stage IV disease  Patient will continue with drainage of  the left Pleurx catheter once a day, and right Pleurx was removed today. Patient was instructed in local wound care  We'll plan to see him back in 4 weeks with a follow-up chest x-ray, he will continue to report to [place] the amount of drainageEdward Maryruth Bun MD      [address] [place] [address] Office [phone]   Beeper [phone]  08/08/2015 10:12 AM

## 2015-08-09 ENCOUNTER — Encounter: Payer: Self-pay | Admitting: *Deleted

## 2015-08-12 ENCOUNTER — Telehealth: Payer: Self-pay | Admitting: *Deleted

## 2015-08-12 ENCOUNTER — Encounter: Payer: Self-pay | Admitting: Occupational Therapy

## 2015-08-12 ENCOUNTER — Telehealth: Payer: Self-pay | Admitting: Occupational Therapy

## 2015-08-12 ENCOUNTER — Ambulatory Visit: Payer: BLUE CROSS/BLUE SHIELD | Attending: Oncology | Admitting: Occupational Therapy

## 2015-08-12 DIAGNOSIS — M6289 Other specified disorders of muscle: Secondary | ICD-10-CM | POA: Diagnosis not present

## 2015-08-12 DIAGNOSIS — R29898 Other symptoms and signs involving the musculoskeletal system: Secondary | ICD-10-CM

## 2015-08-12 DIAGNOSIS — M6281 Muscle weakness (generalized): Secondary | ICD-10-CM | POA: Diagnosis present

## 2015-08-12 NOTE — Telephone Encounter (Signed)
I am not familiar with estim, but cannot imagine a contraindication due to his cancer

## 2015-08-12 NOTE — Patient Instructions (Signed)
1. Grip Strengthening (Resistive Putty)   Squeeze putty using thumb and all fingers. Repeat _15___ times. Do __2__ sessions per day.   2. Roll putty into tube on table and pinch between each finger and thumb x 10 reps each. (can do ring and small finger together)   3. With side of hand on table (small finger side, thumb side up), slide wrist forwards and backwards on washcloth with emphasis on backwards motion 15-20 reps, 3 times/day  4. With forearm and wrist supported, push cards off top of deck away from hand so that big knuckles have to straighten. Go through entire deck 1-2 times, 3 times/day  Wear splint for functional activities: eating, practicing writing, using mouse at computer, etc.   Do NOT use Rt hand to lift heavy objects at this time  Copyright  VHI. All rights reserved.

## 2015-08-12 NOTE — Therapy (Signed)
[facility] [address] [place], [place], [address] Phone: [phone]   Fax:  [phone]  Occupational Therapy Treatment  Patient Details  Name: Joel Miller MRN: [id number] Date of Birth: 1956/05/09 Referring Provider: [name]  Encounter Date: 08/12/2015      OT End of Session - 08/12/15 1113    Visit Number 4   Number of Visits 17   Date for OT Re-Evaluation 08/23/15   Authorization Type BC[employer]   Authorization - Number of Visits 30   OT Start Time 0930   OT Stop Time 1015   OT Time Calculation (min) 45 min   Activity Tolerance Patient tolerated treatment well      Past Medical History  Diagnosis Date  . Hyperlipidemia   . Obesity     lost 80 lbs  . Hypogonadism male   . Vitamin D deficiency   . Esophageal cancer ([facility]) 05/18/14    Distal  . Allergy     HEY FEVER  . GERD (gastroesophageal reflux disease)     pt feels it was related to cancer  . S/P radiation therapy 06/11/2014-07/18/2014  . Hypertension     no med in 44months due to weight loss  . Diabetes mellitus without complication ([facility])     type II no med 60 days   . Pleural effusion     Past Surgical History  Procedure Laterality Date  . Colonoscopy    . 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 Complete esophagectomy N/A 09/05/2014    Procedure: TRANSHIATAL TOTAL ESOPHAGECTOMY; Cervical esophagogastrostomy;  Surgeon: [name], MD;  Location: [facility];  Service: Thoracic;  Laterality: N/A;  . Video bronchoscopy N/A 09/05/2014    Procedure: VIDEO BRONCHOSCOPY;  Surgeon: [name], MD;  Location: [facility];  Service: Thoracic;  Laterality: N/A;  . Jejunostomy N/A 09/05/2014    Procedure: FEEDING JEJUNOSTOMY;  Surgeon: [name], MD;  Location: [facility];  Service: Thoracic;  Laterality: N/A;  . Pyloroplasty N/A 09/05/2014    Procedure: PYLOROPLASTY;  Surgeon: [name], MD;  Location: [facility];  Service: Thoracic;  Laterality:  N/A;  . Wisdom tooth extraction      2010  . Chest tube insertion Right 05/24/2015    Procedure: INSERTION PLEURAL DRAINAGE CATHETER;  Surgeon: [name], MD;  Location: [facility];  Service: Thoracic;  Laterality: Right;  . Subxyphoid pericardial window N/A 07/03/2015    Procedure: SUBXYPHOID PERICARDIAL WINDOW;  Surgeon: [name], MD;  Location: [facility];  Service: Thoracic;  Laterality: N/A;  . Chest tube insertion Left 07/03/2015    Procedure: INSERTION PLEURAL DRAINAGE CATHETER;  Surgeon: [name], MD;  Location: [facility];  Service: Thoracic;  Laterality: Left;  . Cardioversion N/A 07/04/2015    Procedure: Bedside CARDIOVERSION;  Surgeon: Josue Hector, MD;  Location: [facility];  Service: Cardiovascular;  Laterality: N/A;  . Esophagogastroduodenoscopy N/A 07/05/2015    Procedure: ESOPHAGOGASTRODUODENOSCOPY (EGD);  Surgeon: Gatha Mayer, MD;  Location: [facility] ENDOSCOPY;  Service: Endoscopy;  Laterality: N/A;    There were no vitals filed for this visit.  Visit Diagnosis:  Weakness of right hand  Muscle weakness of right arm      Subjective Assessment - 08/12/15 0933    Subjective  My splint is doing fine when it's on, but I don't wear it that much   Pertinent History See snapshot    Limitations Decreased conditioning, endurance   Patient Stated  Goals I would love to be able to work my computer mouse   Currently in Pain? No/denies                      OT Treatments/Exercises (OP) - 08/12/15 0001    ADLs   ADL Comments Discussed nerve injuries and recovery process depending on level of nerve injury and location. Recommended getting n. conduction study and EMG if pt wants more specific timeline for recovery and discussion with MD. Also encouraged pt to wear radial n. palsy splint to assist with functional activities during the day. Will send note to oncologist re: if cleared to try estim for wrist/finger extension   Exercises   Exercises Hand   Hand  Exercises   Other Hand Exercises Pt provided HEP - See pt instructions for details. Pt return demo of each                OT Education - 08/12/15 1017    Education provided Yes   Education Details Updated HEP   Person(s) Educated Patient   Methods Explanation;Demonstration;Handout   Comprehension Verbalized understanding;Returned demonstration          OT Short Term Goals - 07/24/15 1126    OT SHORT TERM GOAL #1   Title Patient will be independent with HEP   Time 4   Period Weeks   Status New   OT SHORT TERM GOAL #2   Title Patient will be able to effectively don/doff wrist/hand orthosis, and demonstrate undersatnding of wearing schedule   Time 4   Period Weeks   Status New   OT SHORT TERM GOAL #3   Title Patient will demonstrate increase by 5 lb to right gross grasp strength to aide with functional grasp   Baseline 20 lbs   Time 4   Period Weeks   Status New   OT SHORT TERM GOAL #4   Title Patient will increase lateral pinch strength in right hand by 2 lbs to increase ability to pick up and hold small objects in dominant hand   Baseline 7 lbs   Time 4   Period Weeks   Status New           OT Long Term Goals - 07/24/15 1130    OT LONG TERM GOAL #1   Title Patient will be independent with upgraded HEP   Time 8   Period Weeks   Status New   OT LONG TERM GOAL #2   Title Patient will demonstrate 10 lb increase form baseline gross grasp with right hand   Baseline 20 lb   Time 8   Period Weeks   Status New   OT LONG TERM GOAL #3   Title Patient will demonstrate 5 lb increase from baseline for lateral pinch in right hand   Baseline 7 lbs   Time 8   Period Weeks   Status New   OT LONG TERM GOAL #4   Title Patient will demonstrate effective technique to tie shoes, button shirt/ pants, buckle belt with either bimanual technique or effective adaptation   OT LONG TERM GOAL #5   Title Patient will demonstrate effective computer  mouse use with right UE    Time 8   Period Weeks   Status New   Long Term Additional Goals   Additional Long Term Goals Yes   OT LONG TERM GOAL #6   Title Patient will don/ doff front opening shirt or jacket without assistance in  less than one minute (excludes zippers and buttons)   Time 8   Period Weeks   Status New               Plan - 08/12/15 1114    Clinical Impression Statement Pt with lots of questions re: recovery process and recommended neurology consult for further discussion.    Plan reduce to every other week until further motion gained, will increase frequency again when appropriate. Therapist also to send note to oncoloist for approval of estim - check for next session   Consulted and Agree with Plan of Care Patient        Problem List Patient Active Problem List   Diagnosis Date Noted  . Chemotherapeutic agent or infusion extravasation 07/17/2015  . PAF (paroxysmal atrial fibrillation) ([facility]) 07/03/2015  . Anemia of chronic disease 07/02/2015  . Bilateral pleural effusion 07/02/2015  . Protein-calorie malnutrition, severe ([facility]) 07/02/2015  . DVT (deep venous thrombosis) ([facility]) 07/01/2015  . Acute pulmonary embolism ([facility]) 07/01/2015  . Pericardial effusion 07/01/2015  . Dysphagia 07/01/2015  . Carcinoma of distal third of esophagus ([facility]) 05/28/2014    Carey Bullocks, OTR/L 08/12/2015, 11:17 AM  [facility] [address] [place], [place], [address] Phone: [phone]   Fax:  [phone]  Name: Joel Miller MRN: [id number] Date of Birth: 17-Jun-1956

## 2015-08-12 NOTE — Telephone Encounter (Signed)
Patient called "requesting refill for Percocet.  I run out Thursday and don't see him again untill next Monday."  Return number when ready for pick up is [phone].

## 2015-08-12 NOTE — Telephone Encounter (Signed)
Dr. 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 Is estim contraindicated for active CA? Are we cleared to perform on him, or should we not perform?  If cleared, we will do estim with this patient.  Thanks Redmond Baseman, OTR/L

## 2015-08-13 ENCOUNTER — Encounter: Payer: BLUE CROSS/BLUE SHIELD | Admitting: Occupational Therapy

## 2015-08-14 ENCOUNTER — Other Ambulatory Visit: Payer: Self-pay | Admitting: *Deleted

## 2015-08-14 DIAGNOSIS — C155 Malignant neoplasm of lower third of esophagus: Secondary | ICD-10-CM

## 2015-08-14 MED ORDER — OXYCODONE-ACETAMINOPHEN 5-325 MG PO TABS: 1.0000 | ORAL_TABLET | ORAL | Status: DC | PRN

## 2015-08-14 NOTE — Telephone Encounter (Signed)
Per pt request; notified pt that re-fill for Percocet is ready for p/u.  Pt verbalized understanding and expressed appreciation.

## 2015-08-15 LAB — AFB CULTURE WITH SMEAR (NOT AT ARMC): Acid Fast Smear: NONE SEEN

## 2015-08-17 ENCOUNTER — Other Ambulatory Visit: Payer: Self-pay | Admitting: Oncology

## 2015-08-19 ENCOUNTER — Ambulatory Visit (HOSPITAL_BASED_OUTPATIENT_CLINIC_OR_DEPARTMENT_OTHER): Payer: BLUE CROSS/BLUE SHIELD | Admitting: Oncology

## 2015-08-19 VITALS — BP 121/80 | HR 91 | Temp 98.1°F | Resp 18 | Ht 70.0 in | Wt 167.0 lb

## 2015-08-19 DIAGNOSIS — I313 Pericardial effusion (noninflammatory): Secondary | ICD-10-CM | POA: Diagnosis not present

## 2015-08-19 DIAGNOSIS — C160 Malignant neoplasm of cardia: Secondary | ICD-10-CM

## 2015-08-19 DIAGNOSIS — Z23 Encounter for immunization: Secondary | ICD-10-CM | POA: Diagnosis not present

## 2015-08-19 DIAGNOSIS — C155 Malignant neoplasm of lower third of esophagus: Secondary | ICD-10-CM | POA: Diagnosis not present

## 2015-08-19 DIAGNOSIS — E119 Type 2 diabetes mellitus without complications: Secondary | ICD-10-CM

## 2015-08-19 DIAGNOSIS — J90 Pleural effusion, not elsewhere classified: Secondary | ICD-10-CM

## 2015-08-19 MED ORDER — INFLUENZA VAC SPLIT QUAD 0.5 ML IM SUSY
0.5000 mL | PREFILLED_SYRINGE | Freq: Once | INTRAMUSCULAR | Status: AC
  Administered 2015-08-19: 0.5 mL via INTRAMUSCULAR
  Filled 2015-08-19: qty 0.5

## 2015-08-19 NOTE — Progress Notes (Signed)
[facility] OFFICE PROGRESS NOTE   Diagnosis:  Esophagus cancer  INTERVAL HISTORY:    Joel Miller returns as scheduled. He reports an improved energy level. The right Pleurx has been removed. The left Pleurx drainage is down to 25 mL. He has an improved appetite. No dysphagia.  Objective:  Vital signs in last 24 hours:  Blood pressure 121/80, pulse 91, temperature 98.1 F (36.7 C), temperature source Oral, resp. rate 18, height 5' 10"  (1.778 m), weight 167 lb (75.751 kg), SpO2 99 %.    HEENT:  Neck without mass Lymphatics:  No cervical or supra-clavicular nodes Resp:  Decreased breath sounds at the lower chest bilaterally, no respiratory distress Cardio:  Regular rate and rhythm GI:  No hepatomegaly, no mass, nontender Vascular:  1+ pitting edema at the lower leg bilaterally Neuro: right wrist drop - slightly improved       Lab Results:  Lab Results  Component Value Date   WBC 5.7 08/05/2015   HGB 9.7* 08/05/2015   HCT 29.6* 08/05/2015   MCV 89.6 08/05/2015   PLT 340 08/05/2015   NEUTROABS 4.9 08/05/2015     Lab Results  Component Value Date   CEA 2.2 06/03/2015    Medications: I have reviewed the patient's current medications.  Assessment/Plan: 1. Adenocarcinoma the esophagus/GE junction-status post an endoscopy 05/18/2014 confirming a gastric cardia/lower esophageal mass with tumor extending proximally in the esophagus to 25 cm from the incisors  HER-2/neu amplified   Staging CT scan 05/22/2014 with indeterminate bilateral pulmonary nodules; and prominent paraesophageal, porta hepatis, and para-aortic lymph nodes   Staging PET scan 05/29/2014 confirmed hypermetabolic soft tissue thickening at the distal third of the esophagus extending into the proximal stomach. 2 additional smaller areas of hypermetabolism or noted in the more proximal esophagus with a mildly enlarged hypermetabolic gastrohepatic node.   Initiation of radiation and  concurrent Taxol/carboplatin 06/11/2014; Taxol/carboplatin completed 07/09/2014; radiation completed 07/18/2014.  Esophagectomy/jejunostomy feeding tube placement 09/05/2014 with the pathology revealing [id number] tumor, HER-2/neu amplified, positive distal resection margin   Cycle 1 FOLFOX 11/01/2014  Cycle 2 FOLFOX 11/15/2014  Cycle 3 FOLFOX 11/29/2014  Cycle 4 FOLFOX 12/13/2014  Cycle 5 FOLFOX 12/27/2014  Cycle 6 FOLFOX 01/17/2015  CTs of the chest, abdomen, and pelvis on 02/01/2015-negative for recurrent esophagus cancer  CT of the chest 05/14/2015 revealed a large right pleural effusion,small left effusion, left lung nodules, mediastinal lymphadenopathy, and a left adrenal metastasis  Placement of a right Pleurx catheter 05/24/2015  Echocardiogram 05/31/2015-LVEF 60-65%  Cycle 1 Taxol/Herceptin 06/04/2015  Cycle 2 Taxol/Herceptin 06/10/2015  Cycle 3 Taxol/Herceptin 06/17/2015  Cycle 4 Taxol/Herceptin 06/24/2015  Cycle 5 Taxol/Herceptin 07/16/2015  Cycle 6 Taxol/Herceptin 07/22/2015  Cycle 7 Taxol/Herceptin 07/29/2015  2. History of anorexia/weight loss 3. Diabetes  4. Hypertension  5. Hyperlipidemia  6. Back pain-potentially related to chronic spine disease versus metastases. He continues Percocet as needed 7. Solid/liquid dysphasia-esophagram 07/01/2015 confirmed a stricture near the pylorus, upper endoscopy 07/05/2015 with no evidence of a stricture and no malignancy seen 8. Pulmonary embolism confirmed on a CT 07/01/2015-Lovenox initiated, to begin Xarelto 07/10/2015 9. Pericardial effusion and progressive left pleural effusion on the CT 07/01/2015-status post a pericardial window and placement of a left Pleurx 07/03/2015, negative pericardium biopsy and negative pericardial/left pleural fluid cytology 10. Right wristdrop-most likely secondary to peripheral nerve compression from sleeping in a recliner, now wearing a brace and followed in occupational  therapy 11. Sacral decubitus ulcers   Disposition:   Mr. Joel Miller has  an improved performance status. He will remain off of systemic therapy. He will undergo a restaging CT evaluation in approximately one week. He will return for an office visit during the week of 09/16/2015. He plans to schedule upon the with Dr. Servando Miller this week to discuss removing the left Pleurx. He received an influenza vaccine today.  Joel Coder, MD  08/19/2015  5:04 PM

## 2015-08-19 NOTE — Telephone Encounter (Signed)
Call Documentation      Amy Tommi Rumps, RN at 08/14/2015 10:32 AM     Status: Signed       Expand All Collapse All   Per pt request; notified pt that re-fill for Percocet is ready for p/u. Pt verbalized understanding and expressed appreciation.

## 2015-08-20 ENCOUNTER — Encounter: Payer: BLUE CROSS/BLUE SHIELD | Admitting: Occupational Therapy

## 2015-08-21 ENCOUNTER — Other Ambulatory Visit: Payer: Self-pay | Admitting: *Deleted

## 2015-08-21 DIAGNOSIS — J90 Pleural effusion, not elsewhere classified: Secondary | ICD-10-CM

## 2015-08-22 ENCOUNTER — Encounter: Payer: BLUE CROSS/BLUE SHIELD | Admitting: Occupational Therapy

## 2015-08-23 ENCOUNTER — Other Ambulatory Visit: Payer: Self-pay | Admitting: Cardiothoracic Surgery

## 2015-08-23 ENCOUNTER — Ambulatory Visit (INDEPENDENT_AMBULATORY_CARE_PROVIDER_SITE_OTHER): Payer: BLUE CROSS/BLUE SHIELD | Admitting: Physician Assistant

## 2015-08-23 ENCOUNTER — Ambulatory Visit
Admission: RE | Admit: 2015-08-23 | Discharge: 2015-08-23 | Disposition: A | Discharge status: Home / Self Care | Payer: BLUE CROSS/BLUE SHIELD | Source: Ambulatory Visit | Attending: Cardiothoracic Surgery | Admitting: Cardiothoracic Surgery

## 2015-08-23 VITALS — BP 117/78 | HR 96 | Resp 20 | Ht 70.0 in | Wt 167.0 lb

## 2015-08-23 DIAGNOSIS — J90 Pleural effusion, not elsewhere classified: Secondary | ICD-10-CM

## 2015-08-23 DIAGNOSIS — C155 Malignant neoplasm of lower third of esophagus: Secondary | ICD-10-CM | POA: Diagnosis not present

## 2015-08-23 DIAGNOSIS — J948 Other specified pleural conditions: Secondary | ICD-10-CM | POA: Diagnosis not present

## 2015-08-23 NOTE — Progress Notes (Signed)
  HPI:  Joel Miller is a 59 yo WM presents today for Pleur-x catheter removal on the left side. He has known history of Esophageal cancer S/P Esophagectomy 08/2014.  He later developed a large pleural effusion felt to be malignant in nature and underwent insertion of Pleur-x drainage catheter on 07/03/2015.  His catheter was last drained on Wednesday 08/21/2015 at which time only "foam" was removed.     Current Outpatient Prescriptions  Medication Sig Dispense Refill  . amiodarone (PACERONE) 200 MG tablet Take 1 tablet (200 mg total) by mouth daily. 30 tablet 11  . feeding supplement (BOOST / RESOURCE BREEZE) LIQD Take 1 Container by mouth 3 (three) times daily between meals.  0  . LORazepam (ATIVAN) 0.5 MG tablet Take 1 tablet (0.5 mg total) by mouth every 6 (six) hours as needed for anxiety. 60 tablet 1  . oxazepam (SERAX) 15 MG capsule TAKE 1 TO 2 CAPSULES BY MOUTH 1 HOUR PRIOR TO BEDTIME FOR SLEEP IF NEEDED. *STOP TAKING ZOLPIDEM* 60 capsule 2  . oxyCODONE-acetaminophen (PERCOCET/ROXICET) 5-325 MG tablet Take 1 tablet by mouth every 4 (four) hours as needed for moderate pain or severe pain. 100 tablet 0  . prochlorperazine (COMPAZINE) 10 MG tablet Take 1 tablet (10 mg total) by mouth every 6 (six) hours as needed for nausea. 30 tablet 1  . rivaroxaban (XARELTO) 20 MG TABS tablet Take 1 tablet (20 mg total) by mouth daily with supper. 30 tablet 2   No current facility-administered medications for this visit.    Physical Exam:  BP 117/78 mmHg  Pulse 96  Resp 20  Ht 5\' 10"  (1.778 m)  Wt 167 lb (75.751 kg)  BMI 23.96 kg/m2  SpO2 98%  Gen: no apparent distress Heart: RRR Lungs: diminished bibasilar Wound: clean and dry  Diagnostic Tests:  CXR: left pleural effusion, question of increase in size, however likely scar from previous intervention  A/P:  1. Esophageal Cancer- S/P esophagectomy 08/2014 2. Malignant Pleural Effusion, Pleurx placed 07/03/2015, no longer draining- will  remove today 3. RTC in 1 week with repeat CT scan with Dr. Servando Snare   Procedure: Removal of Pleur-x catheter  Patient's left side was prepped with Betadine.  Suture holding Pleur-x in place was removed.  Local anesthesia was applied using 1% lidocaine without Epi.  Once anesthesia was achieved, blunt dissection was performed to remove adhesions from cuff of Pleur-x.  This was done without difficulty and pleur-x was easily removed.  Wound was cleaned and closed with 2 interupted 3-0 Nylon sutures.  A clean dry dressing was placed.  There was minimal blood loss.  Ellwood Handler, PA-C [facility] [phone]

## 2015-08-27 ENCOUNTER — Ambulatory Visit: Payer: BLUE CROSS/BLUE SHIELD | Admitting: Occupational Therapy

## 2015-08-27 ENCOUNTER — Telehealth: Payer: Self-pay

## 2015-08-27 DIAGNOSIS — M6281 Muscle weakness (generalized): Secondary | ICD-10-CM

## 2015-08-27 DIAGNOSIS — R29898 Other symptoms and signs involving the musculoskeletal system: Secondary | ICD-10-CM

## 2015-08-27 DIAGNOSIS — M6289 Other specified disorders of muscle: Secondary | ICD-10-CM | POA: Diagnosis not present

## 2015-08-27 NOTE — Telephone Encounter (Signed)
Pt called requesting oxycodone-APAP refill before holiday weekend. He will run out over the weekend.

## 2015-08-27 NOTE — Patient Instructions (Signed)
   With side of hand on table (small finger side, thumb side up), slide big knuckles backwards and then close hand with emphasis on backwards motion 15-20 reps, 3 times/day  With side of hand on table (small finger side, thumb side up), lift thumb up as much as you can 15-20 reps, 3 times/day

## 2015-08-27 NOTE — Therapy (Signed)
[facility] [address] [place], [place], [address] Phone: [phone]   Fax:  [phone]  Occupational Therapy Treatment  Patient Details  Name: Joel Miller MRN: [id number] Date of Birth: 17-Jun-1956 Referring Provider: [name]  Encounter Date: 08/27/2015      OT End of Session - 08/27/15 1558    Visit Number 5   Number of Visits 17   Date for OT Re-Evaluation 09/22/15   Authorization Type [employer]   Authorization - Number of Visits 30   OT Start Time 1536   OT Stop Time 1619   OT Time Calculation (min) 43 min   Activity Tolerance Patient tolerated treatment well   Behavior During Therapy [facility] for tasks assessed/performed      Past Medical History  Diagnosis Date  . Hyperlipidemia   . Obesity     lost 80 lbs  . Hypogonadism male   . Vitamin D deficiency   . Esophageal cancer ([place]) 05/18/14    Distal  . Allergy     HEY FEVER  . GERD (gastroesophageal reflux disease)     pt feels it was related to cancer  . S/P radiation therapy 06/11/2014-07/18/2014  . Hypertension     no med in 29months due to weight loss  . Diabetes mellitus without complication ([place])     type II no med 60 days   . Pleural effusion     Past Surgical History  Procedure Laterality Date  . Colonoscopy    . 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 Complete esophagectomy N/A 09/05/2014    Procedure: TRANSHIATAL TOTAL ESOPHAGECTOMY; Cervical esophagogastrostomy;  Surgeon: [name], MD;  Location: [facility];  Service: Thoracic;  Laterality: N/A;  . Video bronchoscopy N/A 09/05/2014    Procedure: VIDEO BRONCHOSCOPY;  Surgeon: [name], MD;  Location: [facility];  Service: Thoracic;  Laterality: N/A;  . Jejunostomy N/A 09/05/2014    Procedure: FEEDING JEJUNOSTOMY;  Surgeon: [name], MD;  Location: [facility];  Service: Thoracic;  Laterality: N/A;  . Pyloroplasty N/A 09/05/2014    Procedure: PYLOROPLASTY;  Surgeon: [name], MD;  Location: [facility];  Service: Thoracic;  Laterality: N/A;  . Wisdom tooth extraction      2010  . Chest tube insertion Right 05/24/2015    Procedure: INSERTION PLEURAL DRAINAGE CATHETER;  Surgeon: [name], MD;  Location: [facility];  Service: Thoracic;  Laterality: Right;  . Subxyphoid pericardial window N/A 07/03/2015    Procedure: SUBXYPHOID PERICARDIAL WINDOW;  Surgeon: [name], MD;  Location: [facility];  Service: Thoracic;  Laterality: N/A;  . Chest tube insertion Left 07/03/2015    Procedure: INSERTION PLEURAL DRAINAGE CATHETER;  Surgeon: [name], MD;  Location: [facility];  Service: Thoracic;  Laterality: Left;  . Cardioversion N/A 07/04/2015    Procedure: Bedside CARDIOVERSION;  Surgeon: Josue Hector, MD;  Location: [facility];  Service: Cardiovascular;  Laterality: N/A;  . Esophagogastroduodenoscopy N/A 07/05/2015    Procedure: ESOPHAGOGASTRODUODENOSCOPY (EGD);  Surgeon: Gatha Mayer, MD;  Location: [facility] END[facility]COPY;  Service: Endoscopy;  Laterality: N/A;    There were no vitals filed for this visit.  Visit Diagnosis:  Muscle weakness of right arm  Weakness of right hand      Subjective Assessment - 08/27/15 1614    Subjective  "I can move it a little more"   Pertinent History See snapshot    Limitations Decreased conditioning, endurance   Patient  Stated Goals I would love to be able to work my computer mouse   Currently in Pain? No/denies            [facility] OT Assessment - 08/27/15 0001    AROM   Right Wrist Extension -50 Degrees   Right Hand AROM   R Thumb MCP 0-60 5 Degrees  ext   R Index  MCP 0-90 -30 Degrees  ext   R Long  MCP 0-90 -50 Degrees  ext   R Ring  MCP 0-90 -50 Degrees  ext   R Little  MCP 0-90 -50 Degrees   Hand Function   Right Hand Grip (lbs) 16   Right Hand Lateral Pinch 10 lbs                  OT Treatments/Exercises (OP) - 08/27/15 0001    ADLs   ADL Comments checked STGs and ROM measurements and  discussed progress and ADL performance.--see goals section   Hand Exercises   Other Hand Exercises Reviewed initial HEP and pt verbalized understanding.   Other Hand Exercises AAROM wrist extension in gravity eliminated position.   Modalities   Modalities [employer] Location dorsal forarm   Electrical Stimulation Action wrist/finger extension   Electrical Stimulation Parameters x2min, 50pps, 250 pulse width, 10sec cycle, 2sec ramp, intensity=21 with no adverse reactions while pt actively assisting during on cycle.  WHILE discussing ADLs/progress   Electrical Stimulation Goals Strength;Neuromuscular facilitation                OT Education - 08/27/15 1640    Education Details Added ex to HEP--see pt instructions   Person(s) Educated Patient   Methods Explanation;Verbal cues;Handout;Demonstration   Comprehension Verbalized understanding;Returned demonstration;Verbal cues required          OT Short Term Goals - 08/27/15 1602    OT SHORT TERM GOAL #1   Title Patient will be independent with HEP   Time 4   Period Weeks   Status Achieved  08/27/15   OT SHORT TERM GOAL #2   Title Patient will be able to effectively don/doff wrist/hand orthosis, and demonstrate undersatnding of wearing schedule   Time 4   Period Weeks   Status Achieved   OT SHORT TERM GOAL #3   Title Patient will demonstrate increase by 5 lb to right gross grasp strength to aide with functional grasp   Baseline 20 lbs   Time 4   Period Weeks   Status On-going  16lbs 08/27/15   OT SHORT TERM GOAL #4   Title Patient will increase lateral pinch strength in right hand by 2 lbs to increase ability to pick up and hold small objects in dominant hand   Baseline 7 lbs   Time 4   Period Weeks   Status Achieved  08/27/15  10lbs           OT Long Term Goals - 08/27/15 1611    OT LONG TERM GOAL #1   Title Patient will be independent with  upgraded HEP   Time 8   Period Weeks   Status New   OT LONG TERM GOAL #2   Title Patient will demonstrate 10 lb increase form baseline gross grasp with right hand   Baseline 20 lb   Time 8   Period Weeks   Status New   OT LONG TERM GOAL #3   Title Patient will demonstrate 5 lb increase from  baseline for lateral pinch in right hand   Baseline 7 lbs   Time 8   Period Weeks   Status New   OT LONG TERM GOAL #4   Title Patient will demonstrate effective technique to tie shoes, button shirt/ pants, buckle belt with either bimanual technique or effective adaptation   Status Achieved  112/20/16   OT LONG TERM GOAL #5   Title Patient will demonstrate effective computer  mouse use with right UE   Time 8   Period Weeks   Status On-going   OT LONG TERM GOAL #6   Title Patient will don/ doff front opening shirt or jacket without assistance in less than one minute (excludes zippers and buttons)   Time 8   Period Weeks   Status On-going               Plan - 08/27/15 1655    Clinical Impression Statement Pt demo incr active extension of thumb, 2nd digit and incr wrist ext in gravity eliminated position.  Pt also with good response to estim.   Pt will benefit from skilled therapeutic intervention in order to improve on the following deficits (Retired) Decreased activity tolerance;Decreased knowledge of use of DME;Decreased coordination;Decreased range of motion;Decreased strength;Impaired UE functional use;Impaired sensation   Rehab Potential Good   OT Frequency 2x / week   OT Duration 8 weeks   OT Treatment/Interventions Self-care/ADL training;Electrical Stimulation;Fluidtherapy;Moist Heat;Therapeutic exercise;Neuromuscular education;DME and/or AE instruction;Manual Therapy;Splinting;Patient/family education;Therapeutic exercises;Therapeutic activities;Ultrasound;Biofeedback   Plan received MD approval for estim, update HEP as appropriate, estim, continue with current goals    Recommended Other Services received clearance from oncologist for estim   Consulted and Agree with Plan of Care Patient        Problem List Patient Active Problem List   Diagnosis Date Noted  . Chemotherapeutic agent or infusion extravasation 07/17/2015  . PAF (paroxysmal atrial fibrillation) ([place]) 07/03/2015  . Anemia of chronic disease 07/02/2015  . Bilateral pleural effusion 07/02/2015  . Protein-calorie malnutrition, severe ([place]) 07/02/2015  . DVT (deep venous thrombosis) ([place]) 07/01/2015  . Acute pulmonary embolism ([place]) 07/01/2015  . Pericardial effusion 07/01/2015  . Dysphagia 07/01/2015  . Carcinoma of distal third of esophagus ([place]) 05/28/2014    [facility] 08/27/2015, 5:13 PM  [facility] [address] [place], [place], [address] Phone: [phone]   Fax:  [phone]  Name: Joel Miller MRN: [id number] Date of Birth: 1955-10-12  Vianne Bulls, OTR/L 08/27/2015 5:13 PM

## 2015-08-28 ENCOUNTER — Other Ambulatory Visit: Payer: Self-pay | Admitting: *Deleted

## 2015-08-28 ENCOUNTER — Other Ambulatory Visit: Payer: Self-pay | Admitting: Cardiothoracic Surgery

## 2015-08-28 DIAGNOSIS — C155 Malignant neoplasm of lower third of esophagus: Secondary | ICD-10-CM

## 2015-08-28 MED ORDER — OXYCODONE-ACETAMINOPHEN 5-325 MG PO TABS: 1.0000 | ORAL_TABLET | ORAL | Status: DC | PRN

## 2015-08-28 NOTE — Telephone Encounter (Signed)
Notified pt that re-fill request for percocet is ready for p/u.  Pt verbalized understanding.

## 2015-08-29 ENCOUNTER — Encounter: Payer: BLUE CROSS/BLUE SHIELD | Admitting: Occupational Therapy

## 2015-08-29 ENCOUNTER — Encounter: Payer: Self-pay | Admitting: Cardiothoracic Surgery

## 2015-08-29 ENCOUNTER — Ambulatory Visit (HOSPITAL_COMMUNITY): Payer: BLUE CROSS/BLUE SHIELD

## 2015-08-29 ENCOUNTER — Ambulatory Visit
Admission: RE | Admit: 2015-08-29 | Discharge: 2015-08-29 | Disposition: A | Discharge status: Home / Self Care | Payer: BLUE CROSS/BLUE SHIELD | Source: Ambulatory Visit | Attending: Cardiothoracic Surgery | Admitting: Cardiothoracic Surgery

## 2015-08-29 ENCOUNTER — Ambulatory Visit (INDEPENDENT_AMBULATORY_CARE_PROVIDER_SITE_OTHER): Payer: Self-pay | Admitting: Cardiothoracic Surgery

## 2015-08-29 VITALS — BP 120/76 | HR 92 | Resp 20 | Ht 70.0 in | Wt 167.0 lb

## 2015-08-29 DIAGNOSIS — I3139 Other pericardial effusion (noninflammatory): Secondary | ICD-10-CM

## 2015-08-29 DIAGNOSIS — I319 Disease of pericardium, unspecified: Secondary | ICD-10-CM

## 2015-08-29 DIAGNOSIS — J90 Pleural effusion, not elsewhere classified: Secondary | ICD-10-CM

## 2015-08-29 DIAGNOSIS — C155 Malignant neoplasm of lower third of esophagus: Secondary | ICD-10-CM

## 2015-08-29 DIAGNOSIS — I313 Pericardial effusion (noninflammatory): Secondary | ICD-10-CM

## 2015-08-29 MED ORDER — IOPAMIDOL (ISOVUE-300) INJECTION 61%
75.0000 mL | Freq: Once | INTRAVENOUS | Status: AC | PRN
  Administered 2015-08-29: 75 mL via INTRAVENOUS

## 2015-08-29 NOTE — Progress Notes (Signed)
[address]       [place] [address]             [phone]      Joel Miller [facility] Medical Record [id number] Date of Birth: 11/26/1955  Referring: [name], MD Primary Care: [name], MD  Chief Complaint:   POST OP FOLLOW UP 07/03/2015 OPERATIVE REPORT PREOPERATIVE DIAGNOSES: History of esophageal cancer with recent pulmonary embolus, large pericardial effusion and increasing left pleural effusion suspected to be malignant effusion. POSTOPERATIVE DIAGNOSES: History of esophageal cancer with recent pulmonary embolus, large pericardial effusion and increasing left pleural effusion suspected to be malignant effusion. PROCEDURE: Subxiphoid pericardial window with drainage of pericardial effusion. Left PleurX catheter and drainage of left pleural effusion with ultrasound fluoroscopic guidance. 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 SURGICAL PROCEDURE: Video bronchoscopy, transhiatal total esophagectomy with cervical esophagogastrostomy, pyloroplasty, and feeding jejunostomy. 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 SURGICAL PROCEDURE: Both fluoroscopic and ultrasound guidance, placement of a right PleurX catheter, and drainage of right  pleural fluid. SURGEON: [name], MD     History of Present Illness:    Patient returns today for follow-up visit after removal of  bilateral Pleurx catheters,  He continues to do recently well but is bothered by fatigue and difficulty sleeping. Overall he appears functionally better than he was at the time of the CT scan October    Past Medical History  Diagnosis Date  . Hyperlipidemia   . Obesity     lost 80 lbs  . Hypogonadism male   . Vitamin D deficiency   . Esophageal cancer ([facility]) 05/18/14    Distal  . Allergy     HEY FEVER  . GERD (gastroesophageal reflux disease)     pt feels it was related to cancer  . S/P radiation therapy 06/11/2014-07/18/2014  . Hypertension     no med in 13months due to weight loss  . Diabetes mellitus without complication ([facility])     type II no med 60 days   . Pleural effusion      History  Smoking status  . Former Smoker -- 0.25 packs/day for 3 years  . Types: Cigars  Smokeless tobacco  . Never Used    Comment: no cigars for one year    History  Alcohol Use  . 1.2 oz/week  . 2 Standard drinks or equivalent per week    Comment: social     Allergies  Allergen Reactions  . Ace Inhibitors     cough    Current Outpatient Prescriptions  Medication Sig Dispense Refill  . amiodarone (PACERONE) 200 MG tablet Take 1 tablet (200 mg total) by mouth daily. 30 tablet 11  . feeding supplement (BOOST / RESOURCE BREEZE) LIQD Take 1 Container by mouth 3 (three) times daily between meals.  0  . LORazepam (ATIVAN) 0.5 MG tablet Take 1  tablet (0.5 mg total) by mouth every 6 (six) hours as needed for anxiety. 60 tablet 1  . oxazepam (SERAX) 15 MG capsule TAKE 1 TO 2 CAPSULES BY MOUTH 1 HOUR PRIOR TO BEDTIME FOR SLEEP IF NEEDED. *STOP TAKING ZOLPIDEM* 60 capsule 2  . oxyCODONE-acetaminophen (PERCOCET/ROXICET) 5-325 MG tablet Take 1 tablet by mouth every 4 (four) hours as needed for moderate pain or severe pain. 100 tablet 0  .  prochlorperazine (COMPAZINE) 10 MG tablet Take 1 tablet (10 mg total) by mouth every 6 (six) hours as needed for nausea. 30 tablet 1  . rivaroxaban (XARELTO) 20 MG TABS tablet Take 1 tablet (20 mg total) by mouth daily with supper. 30 tablet 2   No current facility-administered medications for this visit.   Wt Readings from Last 3 Encounters:  08/29/15 167 lb (75.751 kg)  08/23/15 167 lb (75.751 kg)  08/19/15 167 lb (75.751 kg)   Physical Exam: BP 120/76 mmHg  Pulse 92  Resp 20  Ht 5\' 10"  (1.778 m)  Wt 167 lb (75.751 kg)  BMI 23.96 kg/m2  SpO2 96%  General appearance: alert, cooperative and appears stated age, but with significant cachexia Neurologic: intact Heart: regular rate and rhythm, S1, S2 normal, no murmur, click, rub or gallop Lungs: clear to auscultation on the left but decreased at the right base Abdomen: soft, non-tender; bowel sounds normal; no masses,  no organomegaly and Abdominal wound is healing well Extremities: extremities normal, atraumatic, no cyanosis or edema and Homans sign is negative, no sign of DVT Wound: Left neck incision is healing well I do not appreciate any cervical or supraclavicular adenopathy  Both Pleurx catheters have been removed  Diagnostic Studies & Laboratory data:     Recent Radiology Findings:  Ct Chest W Contrast  08/29/2015  CLINICAL DATA:  Esophageal cancer post surgery, chemotherapy and radiation therapy EXAM: CT CHEST WITH CONTRAST TECHNIQUE: Multidetector CT imaging of the chest was performed during intravenous contrast administration. Sagittal and coronal MPR images reconstructed from axial data set. CONTRAST:  32mL ISOVUE-300 IOPAMIDOL (ISOVUE-300) INJECTION 61% IV COMPARISON:  07/01/2015 FINDINGS: Thoracic vascular structures grossly patent on nondedicated exam. Post esophagectomy with gastric pull-up. Difficult to exclude wall thickening of the distal stomach at the inferior mediastinum. Enlarged AP window lymph node 12 mm short  axis image 24 previously 16 mm. 8 mm prevascular node image 15 not seen previously. 10 mm short axis RIGHT paratracheal node image 23 previously 11 mm. Decreased pericardial effusion post interval pericardial window procedure. BILATERAL pleural effusions which appear partially loculated. Scattered areas of pleural thickening/ enhancement are seen bilaterally. Previously identified RIGHT thoracostomy tube no longer seen. Foci of air are seen within the pleural collections bilaterally, diffusely at the largest collection at the lower RIGHT chest and laterally at the inferior LEFT chest ; these could be the result of recent intervention/aspiration/ drainage catheter placement or could represent infection/empyema. No enhancing pleural nodularity identified. Few small nodular foci identified in RIGHT upper lobe image 23 and 21, stable. Scattered areas of atelectasis throughout both lungs, greater in lower lobes. Loculated pleural effusion within the fissures. High attenuation focus within the posterior RIGHT lobe liver on the final image number 64, 3.3 x 2.9 cm, could represent transient hepatic attenuation difference or subtle enhancing mass. Thickening of adrenal glands bilaterally without discrete nodule. Atrophic lateral segment LEFT lobe liver. Remaining visualized portion of upper abdomen unremarkable. No worrisome osseous findings. IMPRESSION: Post esophagectomy with gastric pull-up. BILATERAL partially loculated pleural collections bilaterally  with scattered pleural thickening/enhancement and multiple foci of gas RIGHT greater than LEFT; these gas foci could be due to prior thoracentesis or drainage catheter placement but empyema not excluded. Decreased size of AP window lymph node. Stable pulmonary nodules RIGHT upper lobe with scattered atelectasis throughout both lungs. Markedly decreased pericardial effusion by history post pericardial window. Questionable mass lesion versus transient hepatic attenuation  difference at RIGHT lobe liver, incompletely evaluated on last image; followup MR imaging with and without contrast recommended to characterize and exclude hepatic metastasis. Electronically Signed   By: Lavonia Dana M.D.   On: 08/29/2015 09:11   Dg Chest 2 View  06/13/2015   CLINICAL DATA:  History of esophageal malignancy with surgery in January of 2016, history of pleural effusions, currently asymptomatic.  EXAM: CHEST  2 VIEW  COMPARISON:  Portable chest x-ray of May 24, 2015  FINDINGS: There remains a smaller moderate-sized right pleural effusion. The small caliber right chest tube is unchanged in position at the inferior aspect of the pleural space. A small left pleural effusion has increased in size since September. There is no pneumothorax. The pulmonary interstitial markings are slightly more conspicuous today especially on the left. The heart is normal in size. The pulmonary vascularity is not engorged. The bony thorax exhibits no acute abnormality.  IMPRESSION: 1. Mildly increased pulmonary interstitial markings especially on the left since the previous study. This may reflect interstitial edema or early interstitial pneumonia. 2. Stable small a moderate sized right pleural effusion with stable small caliber chest tube. Slight interval increase in the volume of the small left pleural effusion.   Electronically Signed   By: David  Martinique M.D.   On: 06/13/2015 14:22   Ct Angio Chest Pe W/cm &/or Wo Cm  05/14/2015   CLINICAL DATA:  Cough and shortness of breath for 1 week. History of esophageal carcinoma diagnosed in the fall of 2015.  EXAM: CT ANGIOGRAPHY CHEST WITH CONTRAST  TECHNIQUE: Multidetector CT imaging of the chest was performed using the standard protocol during bolus administration of intravenous contrast. Multiplanar CT image reconstructions and MIPs were obtained to evaluate the vascular anatomy.  CONTRAST:  80 mL OMNIPAQUE IOHEXOL 350 MG/ML SOLN  COMPARISON:  CT chest and abdomen  01/31/2015 and PA and lateral chest 04/25/2015.  FINDINGS: No pulmonary embolus is identified. The patient has a massive right pleural effusion which has markedly increased since the prior plain films. A smaller left pleural effusion is identified. There is no pericardial effusion. Heart size is normal.  Postoperative change of esophagectomy and gastric pull-through is again seen. There is new mediastinal lymphadenopathy. Index AP window node on image 34 measures 1.7 cm in diameter. A prevascular node on image 33 is barely perceptible on the prior examination and now measures 1.0 cm short axis dimension on image 33. There is also new and extensive juxtaphrenic lymphadenopathy best seen on the right. Index node on image 79 measures 3.1 x 2.3 cm. Pleural thickening and nodularity are identified on the right most consistent with metastatic disease.  The right lung is nearly completely collapsed secondary to compressive atelectasis from the patient's effusion. A new left lower lobe pulmonary nodule image 60 measures 0.7 cm. A second new left lower lobe pulmonary nodule on image 62 measures 0.9 cm in diameter. A left upper lobe nodule on image 39 measures 0.5 cm in diameter. Several additional smaller nodules are seen in the left lung.  Imaged upper abdomen demonstrates a new left adrenal nodule measuring 2.9  x 1.7 cm on image 95. There is some haziness of the mesenteric about the pancreas which is also new since the prior examination. Imaged upper abdomen is otherwise unremarkable. No lytic or sclerotic bony lesions identified.  Review of the MIP images confirms the above findings.  IMPRESSION: Negative for pulmonary embolus.  Findings consistent with extensive new metastatic esophageal carcinoma with a very large pleural effusion on the right with pleural nodularity consistent with a malignant effusion. The effusion nearly completely compresses the left lung. There is also new mediastinal and juxtaphrenic  lymphadenopathy, left pulmonary nodules and a left adrenal nodule all most consistent with metastatic disease.  Small left pleural effusion.  Stranding of fat about the pancreas is nonspecific and could be due to pancreatitis or mesenteritis.   Electronically Signed   By: Inge Rise M.D.   On: 05/14/2015 10:58   Ct Chest W Contrast  02/01/2015   CLINICAL DATA:  Patient with distal esophageal carcinoma diagnosed 05/18/2014. Status post chemotherapy and radiation. Patient status post esophagectomy 09/05/2014.  EXAM: CT CHEST AND ABDOMEN WITHOUT CONTRAST  TECHNIQUE: Multidetector CT imaging of the chest and abdomen was performed following the standard protocol without intravenous contrast.  COMPARISON:  CT chest 08/09/2014 ; PET-CT 05/29/2014  FINDINGS: CT CHEST FINDINGS  Mediastinum/Nodes: Right anterior chest wall Port-A-Cath is present with tip terminating at the superior cavoatrial junction. No enlarged axillary, mediastinal or hilar lymphadenopathy. Normal heart size. No pericardial effusion. Postoperative changes compatible with esophagectomy and gastric pull-through. No mediastinal fluid collections are identified.  Lungs/Pleura: Central airways are patent. Minimal scarring and or atelectasis medial right lower lobe (image 38; series 4). No large nodular or consolidative pulmonary opacity. Minimal tiny 1 mm nodules within the peripheral lungs bilaterally are unchanged from prior. No pleural effusion or pneumothorax.  Musculoskeletal: Thoracic spine degenerative changes without aggressive or acute appearing osseous lesions.  CT ABDOMEN FINDINGS  Hepatobiliary: Liver is normal in size and contour without focal hepatic lesion identified. Gallbladder is unremarkable. No intrahepatic or extrahepatic biliary ductal dilatation.  Pancreas: Unremarkable  Spleen: Unremarkable  Adrenals/Urinary Tract: Stable thickening of the bilateral adrenal glands. Kidneys enhance symmetrically with contrast. No hydronephrosis.   Stomach/Bowel: Postoperative changes compatible with esophagectomy and gastric pull-through. No evidence for abnormal bowel wall thickening or bowel obstruction. There is nonspecific mesenteric fat stranding within the visualized upper abdomen. No free intraperitoneal air or fluid.  Vascular/Lymphatic: Normal caliber abdominal aorta. No retroperitoneal lymphadenopathy.  Other: Postsurgical changes midline abdominal soft tissues.  Musculoskeletal: Lower lumbar spine degenerative changes. No aggressive or acute appearing osseous lesions.  IMPRESSION: Patient status post interval esophagectomy and pull-through without evidence for locally recurrent or distant metastatic disease.  Nonspecific mesenteric fat stranding within the visualized upper abdomen is favored to be postoperative in etiology.   Electronically Signed   By: Lovey Newcomer M.D.   On: 02/01/2015 08:34       Recent Lab Findings: Lab Results  Component Value Date   WBC 5.7 08/05/2015   HGB 9.7* 08/05/2015   HCT 29.6* 08/05/2015   PLT 340 08/05/2015   GLUCOSE 104 07/29/2015   CHOL 134 04/25/2015   TRIG 72 04/25/2015   HDL 41 04/25/2015   LDLCALC 79 04/25/2015   ALT <9 07/29/2015   AST 14 07/29/2015   NA 134* 07/29/2015   K 4.6 07/29/2015   CL 99* 07/05/2015   CREATININE 0.8 07/29/2015   BUN 16.0 07/29/2015   CO2 25 07/29/2015   TSH 1.106 04/25/2015   INR  1.62* 07/02/2015   HGBA1C 5.9* 04/25/2015     Assessment / Plan:      Adenocarcinoma the esophagus/GE junction-status post an transhiatal total esophagectomy for a stage IIIB adenocarcinoma,presumed  Stage IV disease- ct today is stable , if not decrease in size of mediastinal nodes, question of live lesion but not definate   We'll plan to see him back in 4 weeks with a follow-up chest x-ray  [name] MD      [address] [place] [address] Office [phone]   Beeper [id number]  08/29/2015 9:16 AM

## 2015-09-04 ENCOUNTER — Encounter: Payer: BLUE CROSS/BLUE SHIELD | Admitting: Occupational Therapy

## 2015-09-06 ENCOUNTER — Encounter: Payer: BLUE CROSS/BLUE SHIELD | Admitting: Occupational Therapy

## 2015-09-10 ENCOUNTER — Ambulatory Visit: Payer: BLUE CROSS/BLUE SHIELD | Attending: Oncology | Admitting: Occupational Therapy

## 2015-09-10 DIAGNOSIS — R209 Unspecified disturbances of skin sensation: Secondary | ICD-10-CM

## 2015-09-10 DIAGNOSIS — M6281 Muscle weakness (generalized): Secondary | ICD-10-CM | POA: Insufficient documentation

## 2015-09-10 DIAGNOSIS — R5381 Other malaise: Secondary | ICD-10-CM | POA: Insufficient documentation

## 2015-09-10 DIAGNOSIS — M6289 Other specified disorders of muscle: Secondary | ICD-10-CM | POA: Diagnosis present

## 2015-09-10 DIAGNOSIS — R208 Other disturbances of skin sensation: Secondary | ICD-10-CM | POA: Diagnosis present

## 2015-09-10 DIAGNOSIS — R29898 Other symptoms and signs involving the musculoskeletal system: Secondary | ICD-10-CM

## 2015-09-10 NOTE — Patient Instructions (Signed)
Grip red putty 20 reps 1-2 x day with right hand  Practice touching each fingertip with thumb of right hand 20 reps  Practice crumpling a paper towel or wash cloth in right hand then extend fingers to flatter out towel or washcloth 5-10 reps 1-2 x day  Practice flipping playing cards, then sliding off the top of the deck, 20 reps 1-2x day.

## 2015-09-10 NOTE — Therapy (Signed)
[facility] [address] [place], [place], [address] Phone: [phone]   Fax:  [phone]  Occupational Therapy Treatment  Patient Details  Name: Joel Miller MRN: [id number] Date of Birth: 17-Apr-1956 Referring Kiarrah Rausch: [name]  Encounter Date: 09/10/2015      OT End of Session - 09/10/15 1416    Visit Number 6   Number of Visits 17   Date for OT Re-Evaluation 09/22/15   Authorization Type [employer]   Authorization - Number of Visits 30   OT Start Time [id number]   OT Stop Time 1445   OT Time Calculation (min) 40 min      Past Medical History  Diagnosis Date  . Hyperlipidemia   . Obesity     lost 80 lbs  . Hypogonadism male   . Vitamin D deficiency   . Esophageal cancer ([facility]) 05/18/14    Distal  . Allergy     HEY FEVER  . GERD (gastroesophageal reflux disease)     pt feels it was related to cancer  . S/P radiation therapy 06/11/2014-07/18/2014  . Hypertension     no med in 98months due to weight loss  . Diabetes mellitus without complication ([facility])     type II no med 60 days   . Pleural effusion     Past Surgical History  Procedure Laterality Date  . Colonoscopy    . 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 Complete esophagectomy N/A 09/05/2014    Procedure: TRANSHIATAL TOTAL ESOPHAGECTOMY; Cervical esophagogastrostomy;  Surgeon: [name], MD;  Location: [facility];  Service: Thoracic;  Laterality: N/A;  . Video bronchoscopy N/A 09/05/2014    Procedure: VIDEO BRONCHOSCOPY;  Surgeon: [name], MD;  Location: [facility];  Service: Thoracic;  Laterality: N/A;  . Jejunostomy N/A 09/05/2014    Procedure: FEEDING JEJUNOSTOMY;  Surgeon: [name], MD;  Location: [facility];  Service: Thoracic;  Laterality: N/A;  . Pyloroplasty N/A 09/05/2014    Procedure: PYLOROPLASTY;  Surgeon: [name], MD;  Location: [facility];  Service: Thoracic;  Laterality: N/A;  . Wisdom tooth extraction      2010  . Chest  tube insertion Right 05/24/2015    Procedure: INSERTION PLEURAL DRAINAGE CATHETER;  Surgeon: [name], MD;  Location: [facility];  Service: Thoracic;  Laterality: Right;  . Subxyphoid pericardial window N/A 07/03/2015    Procedure: SUBXYPHOID PERICARDIAL WINDOW;  Surgeon: [name], MD;  Location: [facility];  Service: Thoracic;  Laterality: N/A;  . Chest tube insertion Left 07/03/2015    Procedure: INSERTION PLEURAL DRAINAGE CATHETER;  Surgeon: [name], MD;  Location: [facility];  Service: Thoracic;  Laterality: Left;  . Cardioversion N/A 07/04/2015    Procedure: Bedside CARDIOVERSION;  Surgeon: Josue Hector, MD;  Location: [facility];  Service: Cardiovascular;  Laterality: N/A;  . Esophagogastroduodenoscopy N/A 07/05/2015    Procedure: ESOPHAGOGASTRODUODENOSCOPY (EGD);  Surgeon: Gatha Mayer, MD;  Location: [facility] ENDOSCOPY;  Service: Endoscopy;  Laterality: N/A;    There were no vitals filed for this visit.  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 No/denies      Hand Exercises    Other Hand ExercisesUpgraded HEP with red putty for gripping, see pt instructions for HEP updates. Pt. returned demonstration, (flipping sliding cards off deck, crumple and extend washcloth)   Other Hand Exercises  AAROM wrist extension, in gravity eliminated position. radial deviation with arm supported on table top   Modalities    Modalities  Corporate treasurer Stimulation Location  dorsal forarm    Electrical Stimulation Action  wrist/finger extension    Electrical Stimulation Parameters  x67min, 50pps, 250  pulse width, 10sec cycle, 2sec ramp, intensity=23 with no adverse reactions while pt actively assisting during on cycle.  Electrical Stimulation Goals  Strength;Neuromuscular facilitation                               OT Short Term Goals - 08/27/15 1602    OT SHORT TERM GOAL #1   Title Patient will be independent with HEP   Time 4   Period Weeks   Status Achieved  08/27/15   OT SHORT TERM GOAL #2   Title Patient will be able to effectively don/doff wrist/hand orthosis, and demonstrate undersatnding of wearing schedule   Time 4   Period Weeks   Status Achieved   OT SHORT TERM GOAL #3   Title Patient will demonstrate increase by 5 lb to right gross grasp strength to aide with functional grasp   Baseline 20 lbs   Time 4   Period Weeks   Status On-going  16lbs 08/27/15   OT [place] #4   Title Patient will increase lateral pinch strength in right hand by 2 lbs to increase ability to pick up and hold small objects in dominant hand   Baseline 7 lbs   Time 4   Period Weeks   Status Achieved  08/27/15  10lbs           OT Long Term Goals - 08/27/15 1611    OT LONG TERM GOAL #1   Title Patient will be independent with upgraded HEP   Time 8   Period Weeks   Status New   OT LONG TERM GOAL #2   Title Patient will demonstrate 10 lb increase form baseline gross grasp with right hand   Baseline 20 lb   Time 8   Period Weeks   Status New   OT LONG TERM GOAL #3   Title Patient will demonstrate 5 lb increase from baseline for lateral pinch in right hand   Baseline 7 lbs   Time 8   Period Weeks   Status New   OT LONG TERM GOAL #4   Title Patient will demonstrate effective technique to tie shoes, button shirt/ pants, buckle belt with either bimanual technique or effective adaptation   Status Achieved  112/20/16   OT LONG TERM GOAL #5   Title Patient will demonstrate effective computer  mouse use with right UE   Time 8   Period Weeks   Status  On-going   OT LONG TERM GOAL #6   Title Patient will don/ doff front opening shirt or jacket without assistance in less than one minute (excludes zippers and buttons)   Time 8   Period Weeks   Status On-going               Plan - 09/10/15 1415  Clinical Impression Statement pt is progressing towards goals. He reports improved RUE functional use for right clicking his mouse.   Pt will benefit from skilled therapeutic intervention in order to improve on the following deficits (Retired) Decreased activity tolerance;Decreased knowledge of use of DME;Decreased coordination;Decreased range of motion;Decreased strength;Impaired UE functional use;Impaired sensation   Rehab Potential Good   OT Frequency 2x / week   OT Duration 8 weeks   OT Treatment/Interventions Self-care/ADL training;Electrical Stimulation;Fluidtherapy;Moist Heat;Therapeutic exercise;Neuromuscular education;DME and/or AE instruction;Manual Therapy;Splinting;Patient/family education;Therapeutic exercises;Therapeutic activities;Ultrasound;Biofeedback   Plan continue to work towards goals and update HEP as appropriate.   Consulted and Agree with Plan of Care Patient        Problem List Patient Active Problem List   Diagnosis Date Noted  . Chemotherapeutic agent or infusion extravasation 07/17/2015  . PAF (paroxysmal atrial fibrillation) ([facility]) 07/03/2015  . Anemia of chronic disease 07/02/2015  . Bilateral pleural effusion 07/02/2015  . Protein-calorie malnutrition, severe ([facility]) 07/02/2015  . DVT (deep venous thrombosis) ([facility]) 07/01/2015  . Acute pulmonary embolism ([facility]) 07/01/2015  . Pericardial effusion 07/01/2015  . Dysphagia 07/01/2015  . Carcinoma of distal third of esophagus ([facility]) 05/28/2014    RINE,KATHRYN 09/10/2015, 2:21 PM Theone Murdoch, OTR/L [phone] Phone: [phone] 2:21 PM 09/10/2015 [facility] [address] [place], [place], [address] Phone: [phone]   Fax:  [phone]  Name: Joel Miller MRN: [id number] Date of Birth: 11-14-1955

## 2015-09-12 ENCOUNTER — Encounter: Payer: BLUE CROSS/BLUE SHIELD | Admitting: Cardiothoracic Surgery

## 2015-09-12 NOTE — Progress Notes (Deleted)
Error

## 2015-09-13 ENCOUNTER — Encounter: Payer: Self-pay | Admitting: Internal Medicine

## 2015-09-13 ENCOUNTER — Ambulatory Visit (INDEPENDENT_AMBULATORY_CARE_PROVIDER_SITE_OTHER): Payer: BLUE CROSS/BLUE SHIELD | Admitting: Internal Medicine

## 2015-09-13 VITALS — BP 110/80 | HR 76 | Temp 97.3°F | Resp 16 | Ht 70.25 in | Wt 163.0 lb

## 2015-09-13 DIAGNOSIS — E782 Mixed hyperlipidemia: Secondary | ICD-10-CM | POA: Diagnosis not present

## 2015-09-13 DIAGNOSIS — E119 Type 2 diabetes mellitus without complications: Secondary | ICD-10-CM

## 2015-09-13 DIAGNOSIS — Z79899 Other long term (current) drug therapy: Secondary | ICD-10-CM | POA: Diagnosis not present

## 2015-09-13 DIAGNOSIS — E559 Vitamin D deficiency, unspecified: Secondary | ICD-10-CM

## 2015-09-13 DIAGNOSIS — I10 Essential (primary) hypertension: Secondary | ICD-10-CM | POA: Diagnosis not present

## 2015-09-13 DIAGNOSIS — I480 Paroxysmal atrial fibrillation: Secondary | ICD-10-CM

## 2015-09-13 LAB — BASIC METABOLIC PANEL WITH GFR
BUN: 9 mg/dL (ref 7–25)
CALCIUM: 9 mg/dL (ref 8.6–10.3)
CO2: 30 mmol/L (ref 20–31)
CREATININE: 0.79 mg/dL (ref 0.70–1.25)
Chloride: 96 mmol/L — ABNORMAL LOW (ref 98–110)
GFR, Est African American: >89 mL/min (ref >=60)
GLUCOSE: 84 mg/dL (ref 65–99)
Potassium: 4.7 mmol/L (ref 3.5–5.3)
Sodium: 134 mmol/L — ABNORMAL LOW (ref 135–146)

## 2015-09-13 LAB — HEPATIC FUNCTION PANEL
ALT: 5 U/L — AB (ref 9–46)
AST: 11 U/L (ref 10–35)
Albumin: 3.3 g/dL — ABNORMAL LOW (ref 3.6–5.1)
Alkaline Phosphatase: 85 U/L (ref 40–115)
BILIRUBIN INDIRECT: 0.3 mg/dL (ref 0.2–1.2)
Bilirubin, Direct: 0.1 mg/dL (ref <=0.2)
TOTAL PROTEIN: 6.2 g/dL (ref 6.1–8.1)
Total Bilirubin: 0.4 mg/dL (ref 0.2–1.2)

## 2015-09-13 LAB — LIPID PANEL
CHOLESTEROL: 135 mg/dL (ref 125–200)
HDL: 48 mg/dL (ref >=40)
LDL Cholesterol: 71 mg/dL (ref <130)
Total CHOL/HDL Ratio: 2.8 Ratio (ref <=5.0)
Triglycerides: 80 mg/dL (ref <150)
VLDL: 16 mg/dL (ref <30)

## 2015-09-13 LAB — HEMOGLOBIN A1C
HEMOGLOBIN A1C: 5.4 % (ref <5.7)
Mean Plasma Glucose: 108 mg/dL (ref <117)

## 2015-09-13 LAB — MAGNESIUM: Magnesium: 2 mg/dL (ref 1.5–2.5)

## 2015-09-13 LAB — TSH: TSH: 7.16 u[IU]/mL — ABNORMAL HIGH (ref 0.350–4.500)

## 2015-09-13 NOTE — Progress Notes (Signed)
Patient ID: Joel Miller, male   DOB: February 17, 1956, 60 y.o.   MRN: [id number]   This very nice 60 y.o. MWM presents for  follow up with Hypertension, Hyperlipidemia, Pre-Diabetes and Vitamin D Deficiency. In Dec 2015 he underwent  10" esophagectomy for Ca of the esophagus with hx/o preoperative Chemoradiation and has lost 185# from his preMorbid weight of 348# in 2010 to his current weight of 163#.   Patient is treated for HTN since 2010 and has been able to stop his BP meds with normalization of his BP with his weight loss. BP has been controlled at home. Today's BP: 110/80 mmHg. In Oct 2016 he had a Cardioversion for [employer] & was begun on Amiodarone and Xarelto. Patient has had no complaints of any cardiac type chest pain, palpitations, dyspnea/orthopnea/PND, dizziness, claudication, or dependent edema.   Pt's Hyperlipidemia previously controlled with meds has normalized with his weight loss. Last Lipids were at goal with Cholesterol 134; HDL 41; LDL 79; Triglycerides 72 on  04/25/2015.   Also, the patient has history of T2_NIDDM since 2009 with A1c 6.7% and also with his weight loss his sugars have improved and he has been able to stop his meds (Metformin).  He denies  symptoms of reactive hypoglycemia, diabetic polys, paresthesias or visual blurring.  Last A1c was 5.9% on  04/25/2015.   Further, the patient also has history of Vitamin D Deficiency and supplements vitamin D without any suspected side-effects. Last vitamin D was 51 on 04/25/2015.   Medication Sig  . amiodarone (PACERONE) 200 MG tablet Take 1 tablet (200 mg total) by mouth daily.  Marland Kitchen BOOST Take 1 Container by mouth 3 (three) times daily between meals.  Marland Kitchen LORazepam (ATIVAN) 0.5 MG tablet Take 1 tablet (0.5 mg total) by mouth every 6 (six) hours as needed for anxiety.  Marland Kitchen oxazepam (SERAX) 15 MG  TAKE 1 TO 2 CAPSULES BY MOUTH 1 HOUR PRIOR TO BEDTIME FOR SLEEP IF NEEDED. *STOP TAKING ZOLPIDEM*  . PERCOCET 5-325 MG Take 1 tablet by mouth every 4  (four) hours as needed for moderate pain or severe pain.  Marland Kitchen prochlorperazine 10 MG tablet Take 1 tablet (10 mg total) by mouth every 6 (six) hours as needed for nausea.  . rivaroxaban (XARELTO) 20 MG  Take 1 tablet (20 mg total) by mouth daily with supper.   Allergies  Allergen Reactions  . Ace Inhibitors     cough   PMHx:   Past Medical History  Diagnosis Date  . Hyperlipidemia   . Obesity     lost 80 lbs  . Hypogonadism male   . Vitamin D deficiency   . Esophageal cancer ([place]) 05/18/14    Distal  . Allergy     HEY FEVER  . GERD (gastroesophageal reflux disease)     pt feels it was related to cancer  . S/P radiation therapy 06/11/2014-07/18/2014  . Hypertension     no med in 6months due to weight loss  . Diabetes mellitus without complication ([place])     type II no med 60 days   . Pleural effusion    Immunization History  Administered Date(s) Administered  . Influenza,inj,Quad PF,36+ Mos 06/11/2014, 08/19/2015  . PPD Test 10/09/2013  . Pneumococcal-Unspecified 08/21/2009  . Tdap 05/28/2012   Past Surgical History  Procedure Laterality Date  . Colonoscopy    . 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 Complete esophagectomy - Cervical esophagogastrostomy N/A 09/05/2014  . Video bronchoscopy   Lilia Argue  Servando Snare, MD N/A 09/05/2014  . Jejunostomy  FEEDING  [name], MD N/A 09/05/2014  . Pyloroplasty  [name], MD N/A 09/05/2014  . Wisdom tooth extraction  2010  . Chest tube insertion  [name], MD Right 05/24/2015  . Chest tube insertion Left 07/03/2015  . Cardioversion  Josue Hector N/A 07/04/2015  . Esophagogastroduodenoscopy  Gatha Mayer, MD N/A 07/05/2015   FHx:    Reviewed / unchanged  SHx:    Reviewed / unchanged  Systems Review:  Constitutional: Denies fever, chills, wt changes, headaches, insomnia, fatigue, night sweats, change in appetite. Eyes: Denies redness, blurred vision, diplopia, discharge, itchy, watery eyes.  ENT: Denies  discharge, congestion, post nasal drip, epistaxis, sore throat, earache, hearing loss, dental pain, tinnitus, vertigo, sinus pain, snoring.  CV: Denies chest pain, palpitations, irregular heartbeat, syncope, dyspnea, diaphoresis, orthopnea, PND, claudication or edema. Respiratory: denies cough, dyspnea, DOE, pleurisy, hoarseness, laryngitis, wheezing.  Gastrointestinal: Denies dysphagia, odynophagia, heartburn, reflux, water brash, abdominal pain or cramps, nausea, vomiting, bloating, diarrhea, constipation, hematemesis, melena, hematochezia  or hemorrhoids. Genitourinary: Denies dysuria, frequency, urgency, nocturia, hesitancy, discharge, hematuria or flank pain. Musculoskeletal: Denies arthralgias, myalgias, stiffness, jt. swelling, pain, limping or strain/sprain.  Skin: Denies pruritus, rash, hives, warts, acne, eczema or change in skin lesion(s). Neuro: No weakness, tremor, incoordination, spasms, paresthesia or pain. Psychiatric: Denies confusion, memory loss or sensory loss. Endo: Denies change in weight, skin or hair change.  Heme/Lymph: No excessive bleeding, bruising or enlarged lymph nodes.  Physical Exam  BP 110/80 mmHg  Pulse 76  Temp(Src) 97.3 F (36.3 C)  Resp 16  Ht 5' 10.25" (1.784 m)  Wt 163 lb (73.936 kg)  BMI 23.23 kg/m2  Appears very thin, chronically ill and in no distress.  Eyes: PERRLA, EOMs, conjunctiva no swelling or erythema. Sinuses: No frontal/maxillary tenderness ENT/Mouth: EAC's clear, TM's nl w/o erythema, bulging. Nares clear w/o erythema, swelling, exudates. Oropharynx clear without erythema or exudates. Oral hygiene is good. Tongue normal, non obstructing. Hearing intact.  Neck: Supple. Thyroid nl. Car 2+/2+ without bruits, nodes or JVD. Chest: Respirations nl with BS clear & equal w/o rales, rhonchi, wheezing or stridor.  Cor: Heart sounds normal w/ regular rate and rhythm without sig. murmurs, gallops, clicks, or rubs. Peripheral pulses normal and  equal  without edema.  Abdomen: Soft & bowel sounds normal. Non-tender w/o guarding, rebound, hernias, masses, or organomegaly.  Lymphatics: Unremarkable.  Musculoskeletal: Full ROM all peripheral extremities, joint stability, 5/5 strength, and normal gait.  Skin: Warm, dry without exposed rashes, lesions or ecchymosis apparent.  Neuro: Cranial nerves intact, reflexes equal bilaterally. Sensory-motor testing grossly intact. Tendon reflexes grossly intact.  Pysch: Alert & oriented x 3.  Insight and judgement nl & appropriate. No ideations.  Assessment and Plan:  1. Essential hypertension  - TSH  2. Mixed hyperlipidemia  - Lipid panel - TSH  3. Diabetes mellitus without complication (HCC)  - Hemoglobin A1c - Insulin, random  4. Vitamin D deficiency  - VITAMIN D 25 Hydroxy   5. PAF (paroxysmal atrial fibrillation) ([place])   6. Medication management  - CBC with Differential/Platelet - BASIC METABOLIC PANEL WITH GFR - Hepatic function panel - Magnesium   Recommended regular exercise, BP monitoring, weight control, and discussed med and SE's. Recommended labs to assess and monitor clinical status. Further disposition pending results of labs. Over 30 minutes of exam, counseling, chart review was performed

## 2015-09-13 NOTE — Patient Instructions (Signed)

## 2015-09-14 ENCOUNTER — Other Ambulatory Visit: Payer: Self-pay | Admitting: Internal Medicine

## 2015-09-14 ENCOUNTER — Encounter: Payer: Self-pay | Admitting: Internal Medicine

## 2015-09-14 DIAGNOSIS — E038 Other specified hypothyroidism: Secondary | ICD-10-CM

## 2015-09-14 LAB — CBC WITH DIFFERENTIAL/PLATELET
Basophils Absolute: 0.1 10*3/uL (ref 0.0–0.1)
Basophils Relative: 1 % (ref 0–1)
Eosinophils Absolute: 0.1 10*3/uL (ref 0.0–0.7)
Eosinophils Relative: 1 % (ref 0–5)
HEMATOCRIT: 34.9 % — AB (ref 39.0–52.0)
HEMOGLOBIN: 10.9 g/dL — AB (ref 13.0–17.0)
LYMPHS PCT: 5 % — AB (ref 12–46)
Lymphs Abs: 0.4 10*3/uL — ABNORMAL LOW (ref 0.7–4.0)
MCH: 27.5 pg (ref 26.0–34.0)
MCHC: 31.2 g/dL (ref 30.0–36.0)
MCV: 88.1 fL (ref 78.0–100.0)
MONO ABS: 0.8 10*3/uL (ref 0.1–1.0)
MONOS PCT: 10 % (ref 3–12)
MPV: 9.3 fL (ref 8.6–12.4)
NEUTROS ABS: 6.6 10*3/uL (ref 1.7–7.7)
Neutrophils Relative %: 83 % — ABNORMAL HIGH (ref 43–77)
Platelets: 476 10*3/uL — ABNORMAL HIGH (ref 150–400)
RBC: 3.96 MIL/uL — ABNORMAL LOW (ref 4.22–5.81)
RDW: 17.4 % — ABNORMAL HIGH (ref 11.5–15.5)
WBC: 7.9 10*3/uL (ref 4.0–10.5)

## 2015-09-14 LAB — INSULIN, RANDOM: Insulin: 2 u[IU]/mL (ref 2.0–19.6)

## 2015-09-14 LAB — VITAMIN D 25 HYDROXY (VIT D DEFICIENCY, FRACTURES): Vit D, 25-Hydroxy: 50 ng/mL (ref 30–100)

## 2015-09-14 MED ORDER — LEVOTHYROXINE SODIUM 50 MCG PO TABS: ORAL_TABLET | ORAL | Status: AC

## 2015-09-17 ENCOUNTER — Ambulatory Visit: Payer: BLUE CROSS/BLUE SHIELD | Admitting: Occupational Therapy

## 2015-09-17 ENCOUNTER — Other Ambulatory Visit: Payer: Self-pay | Admitting: Oncology

## 2015-09-17 DIAGNOSIS — R209 Unspecified disturbances of skin sensation: Secondary | ICD-10-CM

## 2015-09-17 DIAGNOSIS — R5381 Other malaise: Secondary | ICD-10-CM

## 2015-09-17 DIAGNOSIS — R29898 Other symptoms and signs involving the musculoskeletal system: Secondary | ICD-10-CM

## 2015-09-17 DIAGNOSIS — M6281 Muscle weakness (generalized): Secondary | ICD-10-CM

## 2015-09-17 NOTE — Therapy (Signed)
[facility] [address] [place], [place], [address] Phone: [phone]   Fax:  [phone]  Occupational Therapy Treatment  Patient Details  Name: Joel Miller MRN: [id number] Date of Birth: [birth year] Referring Provider: [name]  Encounter Date: 09/17/2015      OT End of Session - 09/17/15 1417    Visit Number 7   Number of Visits 17   Date for OT Re-Evaluation 09/22/15   Authorization Type [employer]   Authorization Time Period week 6/8   OT Start Time 1406   OT Stop Time 1445   OT Time Calculation (min) 39 min   Activity Tolerance Patient tolerated treatment well   Behavior During Therapy [facility] for tasks assessed/performed      Past Medical History  Diagnosis Date  . Hyperlipidemia   . Obesity     lost 80 lbs  . Hypogonadism male   . Vitamin D deficiency   . Esophageal cancer ([place]) 05/18/14    Distal  . Allergy     HEY FEVER  . GERD (gastroesophageal reflux disease)     pt feels it was related to cancer  . S/P radiation therapy 06/11/2014-07/18/2014  . Hypertension     no med in 18months due to weight loss  . Diabetes mellitus without complication ([place])     type II no med 60 days   . Pleural effusion     Past Surgical History  Procedure Laterality Date  . Colonoscopy    . 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 Complete esophagectomy N/A 09/05/2014    Procedure: TRANSHIATAL TOTAL ESOPHAGECTOMY; Cervical esophagogastrostomy;  Surgeon: [name], MD;  Location: [facility];  Service: Thoracic;  Laterality: N/A;  . Video bronchoscopy N/A 09/05/2014    Procedure: VIDEO BRONCHOSCOPY;  Surgeon: [name], MD;  Location: [facility];  Service: Thoracic;  Laterality: N/A;  . Jejunostomy N/A 09/05/2014    Procedure: FEEDING JEJUNOSTOMY;  Surgeon: [name], MD;  Location: [facility];  Service: Thoracic;  Laterality: N/A;  . Pyloroplasty N/A 09/05/2014    Procedure: PYLOROPLASTY;  Surgeon: [name], MD;  Location: [facility];  Service: Thoracic;  Laterality: N/A;  . Wisdom tooth extraction      2010  . Chest tube insertion Right 05/24/2015    Procedure: INSERTION PLEURAL DRAINAGE CATHETER;  Surgeon: [name], MD;  Location: [facility];  Service: Thoracic;  Laterality: Right;  . Subxyphoid pericardial window N/A 07/03/2015    Procedure: SUBXYPHOID PERICARDIAL WINDOW;  Surgeon: [name], MD;  Location: [facility];  Service: Thoracic;  Laterality: N/A;  . Chest tube insertion Left 07/03/2015    Procedure: INSERTION PLEURAL DRAINAGE CATHETER;  Surgeon: [name], MD;  Location: [facility];  Service: Thoracic;  Laterality: Left;  . Cardioversion N/A 07/04/2015    Procedure: Bedside CARDIOVERSION;  Surgeon: Josue Hector, MD;  Location: [facility];  Service: Cardiovascular;  Laterality: N/A;  . Esophagogastroduodenoscopy N/A 07/05/2015    Procedure: ESOPHAGOGASTRODUODENOSCOPY (EGD);  Surgeon: Gatha Mayer, MD;  [facility];  Service: Endoscopy;  Laterality: N/A;    There were no vitals filed for this visit.  Visit Diagnosis:  Muscle weakness of right arm  Weakness of right hand  Sensory disturbance  Physical deconditioning      Subjective Assessment - 09/17/15 1412    Subjective  Pt reports he cut his left thumb and had to get stitches this a.m.   Pertinent History  See snapshot    Limitations Decreased conditioning, endurance   Patient Stated Goals I would love to be able to work my computer mouse   Currently in Pain? No/denies          Treatment:  NMES 50 pps 250 pw, 10 secs cycle  Intensity 20 to wrist and finger extensors with good results. Functional grasp release of graded clothespins for sustained pinch, with therapist facilitating wrist position, (pt to bring in d-ring wrist brace next visit.) A/ROM finger extension, wrist extension and radial deviation exercises. Pt practiced grasp release of light medicine bottles to place in container.   Gripper set at 15 lbs to pick up 1" blocks while wearing a trial wrist brace.                       OT Short Term Goals - 08/27/15 1602    OT SHORT TERM GOAL #1   Title Patient will be independent with HEP   Time 4   Period Weeks   Status Achieved  08/27/15   OT SHORT TERM GOAL #2   Title Patient will be able to effectively don/doff wrist/hand orthosis, and demonstrate undersatnding of wearing schedule   Time 4   Period Weeks   Status Achieved   OT SHORT TERM GOAL #3   Title Patient will demonstrate increase by 5 lb to right gross grasp strength to aide with functional grasp   Baseline 20 lbs   Time 4   Period Weeks   Status On-going  16lbs 08/27/15   OT [place] #4   Title Patient will increase lateral pinch strength in right hand by 2 lbs to increase ability to pick up and hold small objects in dominant hand   Baseline 7 lbs   Time 4   Period Weeks   Status Achieved  08/27/15  10lbs           OT Long Term Goals - 08/27/15 1611    OT LONG TERM GOAL #1   Title Patient will be independent with upgraded HEP   Time 8   Period Weeks   Status New   OT LONG TERM GOAL #2   Title Patient will demonstrate 10 lb increase form baseline gross grasp with right hand   Baseline 20 lb   Time 8   Period Weeks   Status New   OT LONG TERM GOAL #3   Title Patient will demonstrate 5 lb increase from baseline for lateral pinch in right hand   Baseline 7 lbs   Time 8   Period Weeks   Status New   OT LONG TERM GOAL #4   Title Patient will demonstrate effective technique to tie shoes, button shirt/ pants, buckle belt with either bimanual technique or effective adaptation   Status Achieved  112/20/16   OT LONG TERM GOAL #5   Title Patient will demonstrate effective computer  mouse use with right UE   Time 8   Period Weeks   Status On-going   OT LONG TERM GOAL #6   Title Patient will don/ doff front opening shirt or jacket without assistance in less than  one minute (excludes zippers and buttons)   Time 8   Period Weeks   Status On-going               Plan - 09/17/15 1726    Clinical Impression Statement Pt is progressing towards goals with improving RUE functional use and strength. Pt  reports increased ease with keyboarding.   Pt will benefit from skilled therapeutic intervention in order to improve on the following deficits (Retired) Decreased activity tolerance;Decreased knowledge of use of DME;Decreased coordination;Decreased range of motion;Decreased strength;Impaired UE functional use;Impaired sensation   Rehab Potential Good   OT Frequency 2x / week   OT Duration 8 weeks   OT Treatment/Interventions Self-care/ADL training;Electrical Stimulation;Fluidtherapy;Moist Heat;Therapeutic exercise;Neuromuscular education;DME and/or AE instruction;Manual Therapy;Splinting;Patient/family education;Therapeutic exercises;Therapeutic activities;Ultrasound;Biofeedback   Plan progress towards goals, update HEP as appropriate   Consulted and Agree with Plan of Care Patient        Problem List Patient Active Problem List   Diagnosis Date Noted  . HTN (hypertension) 09/13/2015  . Vitamin D deficiency 09/13/2015  . Medication management 09/13/2015  . Mixed hyperlipidemia 09/13/2015  . Chemotherapeutic agent or infusion extravasation 07/17/2015  . PAF (paroxysmal atrial fibrillation) ([place]) 07/03/2015  . Anemia of chronic disease 07/02/2015  . Bilateral pleural effusion 07/02/2015  . Protein-calorie malnutrition, severe ([place]) 07/02/2015  . DVT (deep venous thrombosis) ([place]) 07/01/2015  . Acute pulmonary embolism ([place]) 07/01/2015  . Pericardial effusion 07/01/2015  . Esophageal cancer ([place]) 09/05/2014  . Carcinoma of distal third of esophagus ([place]) 05/28/2014  . GERD 04/22/2014  . Diabetes mellitus without complication ([place])   . Testosterone Deficiency     RINE,KATHRYN 09/17/2015, 5:32 PM Theone Murdoch, OTR/L [phone]  [phone] Phone: [phone] 5:32 PM 09/17/2015 [facility] [address] [place], [place], [address] Phone: [phone]   Fax:  [phone]  Name: BRAYVEN SEIDMAN MRN: [id number] Date of Birth: 1956-01-17

## 2015-09-18 ENCOUNTER — Ambulatory Visit (HOSPITAL_BASED_OUTPATIENT_CLINIC_OR_DEPARTMENT_OTHER): Payer: BLUE CROSS/BLUE SHIELD | Admitting: Oncology

## 2015-09-18 ENCOUNTER — Telehealth: Payer: Self-pay | Admitting: Oncology

## 2015-09-18 ENCOUNTER — Other Ambulatory Visit: Payer: Self-pay | Admitting: *Deleted

## 2015-09-18 ENCOUNTER — Telehealth: Payer: Self-pay | Admitting: *Deleted

## 2015-09-18 VITALS — BP 110/77 | HR 93 | Temp 98.5°F | Resp 18 | Wt 162.7 lb

## 2015-09-18 DIAGNOSIS — C160 Malignant neoplasm of cardia: Secondary | ICD-10-CM | POA: Diagnosis not present

## 2015-09-18 DIAGNOSIS — C7972 Secondary malignant neoplasm of left adrenal gland: Secondary | ICD-10-CM | POA: Diagnosis not present

## 2015-09-18 DIAGNOSIS — J90 Pleural effusion, not elsewhere classified: Secondary | ICD-10-CM

## 2015-09-18 DIAGNOSIS — C155 Malignant neoplasm of lower third of esophagus: Secondary | ICD-10-CM | POA: Diagnosis not present

## 2015-09-18 DIAGNOSIS — I2699 Other pulmonary embolism without acute cor pulmonale: Secondary | ICD-10-CM

## 2015-09-18 DIAGNOSIS — E119 Type 2 diabetes mellitus without complications: Secondary | ICD-10-CM

## 2015-09-18 MED ORDER — OXYCODONE-ACETAMINOPHEN 5-325 MG PO TABS: 1.0000 | ORAL_TABLET | ORAL | Status: DC | PRN

## 2015-09-18 NOTE — Telephone Encounter (Addendum)
Gv pt appt for 2/21.  Pt says he had an appt with Dr. Isidore Moos this week that he wants to make sure is cancelled. I advised pt the appt is still on the schedule. He requested that I cancel the appt and says he does not want to r/s. I called [phone] and lvm asking that the appt be canceled.

## 2015-09-18 NOTE — Telephone Encounter (Signed)
Cancelled FU appt. With Dr. Isidore Moos.  Sent note to Dr. Isidore Moos & Liliane Channel to advise

## 2015-09-18 NOTE — Progress Notes (Signed)
[facility] OFFICE PROGRESS NOTE   Diagnosis: Esophagus cancer  INTERVAL HISTORY:   Joel Miller returns as scheduled. He reports no dysphagia. His activity level is improving. He has chronic back pain. He saw Dr. Servando Snare after a restaging chest CT 08/29/2015. The CT revealed partially loculated bilateral pleural effusions, decreased size of an AP window lymph node, and a questionable mass in the right liver. Cut his left thumb on a dog food can yesterday and required stitches. The right wrist strength has improved. He continues Xarelto anticoagulation. No bleeding. Objective:  Vital signs in last 24 hours:  Blood pressure 110/77, pulse 93, temperature 98.5 F (36.9 C), temperature source Oral, resp. rate 18, weight 162 lb 11.2 oz (73.8 kg), SpO2 100 %.    HEENT:  neck without mass Lymphatics:  no cervical, supraclavicular, axillary, or inguinal nodes Resp:  lungs clear bilaterally Cardio:  regular rate and rhythm GI:  no hepatosplenomegaly, no mass, nontender, reducible right inguinal hernia Vascular: no leg edema   neurologic: Partial right wristdrop Skin: Healed sacral decubitus ulcer  Lab Results:  Lab Results  Component Value Date   WBC 7.9 09/13/2015   HGB 10.9* 09/13/2015   HCT 34.9* 09/13/2015   MCV 88.1 09/13/2015   PLT 476* 09/13/2015   NEUTROABS 6.6 09/13/2015    Lab Results  Component Value Date   CEA 2.2 06/03/2015    Imaging:   chest CT 08/29/2015-reviewed  Medications: I have reviewed the patient's current medications.  Assessment/Plan: 1. Adenocarcinoma the esophagus/GE junction-status post an endoscopy 05/18/2014 confirming a gastric cardia/lower esophageal mass with tumor extending proximally in the esophagus to 25 cm from the incisors  HER-2/neu amplified   Staging CT scan 05/22/2014 with indeterminate bilateral pulmonary nodules; and prominent paraesophageal, porta hepatis, and para-aortic lymph nodes   Staging PET scan  05/29/2014 confirmed hypermetabolic soft tissue thickening at the distal third of the esophagus extending into the proximal stomach. 2 additional smaller areas of hypermetabolism or noted in the more proximal esophagus with a mildly enlarged hypermetabolic gastrohepatic node.   Initiation of radiation and concurrent Taxol/carboplatin 06/11/2014; Taxol/carboplatin completed 07/09/2014; radiation completed 07/18/2014.  Esophagectomy/jejunostomy feeding tube placement 09/05/2014 with the pathology revealing [id number] tumor, HER-2/neu amplified, positive distal resection margin   Cycle 1 FOLFOX 11/01/2014  Cycle 2 FOLFOX 11/15/2014  Cycle 3 FOLFOX 11/29/2014  Cycle 4 FOLFOX 12/13/2014  Cycle 5 FOLFOX 12/27/2014  Cycle 6 FOLFOX 01/17/2015  CTs of the chest, abdomen, and pelvis on 02/01/2015-negative for recurrent esophagus cancer  CT of the chest 05/14/2015 revealed a large right pleural effusion,small left effusion, left lung nodules, mediastinal lymphadenopathy, and a left adrenal metastasis  Placement of a right Pleurx catheter 05/24/2015  Echocardiogram 05/31/2015-LVEF 60-65%  Cycle 1 Taxol/Herceptin 06/04/2015  Cycle 2 Taxol/Herceptin 06/10/2015  Cycle 3 Taxol/Herceptin 06/17/2015  Cycle 4 Taxol/Herceptin 06/24/2015  Cycle 5 Taxol/Herceptin 07/16/2015  Cycle 6 Taxol/Herceptin 07/22/2015  Cycle 7 Taxol/Herceptin 07/29/2015   chest CT 08/29/2015 without evidence of disease progression  2. History of anorexia/weight loss 3. Diabetes  4. Hypertension  5. Hyperlipidemia  6. Back pain-potentially related to chronic spine disease versus metastases. He continues Percocet as needed 7. Solid/liquid dysphasia-esophagram 07/01/2015 confirmed a stricture near the pylorus, upper endoscopy 07/05/2015 with no evidence of a stricture and no malignancy seen 8. Pulmonary embolism confirmed on a CT 07/01/2015-Lovenox initiated, to begin Xarelto 07/10/2015 9. Pericardial effusion  and progressive left pleural effusion on the CT 07/01/2015-status post a pericardial window and placement of a left Pleurx  07/03/2015, negative pericardium biopsy and negative pericardial/left pleural fluid cytology 10. Right wristdrop-most likely secondary to peripheral nerve compression from sleeping in a recliner, now wearing a brace and followed in occupational therapy him a improved  11. Sacral decubitus ulcers-healed    Disposition:   Mr. Missey has an improved performance status. There is no clinical or CT evidence for progression of the esophagus cancer. We decided to continue observation. He will see Dr. Servando Snare later this month. He will return for an office visit here in approximately 6 weeks. He will continue Xarelto anticoagulation.  Betsy Coder, MD  09/18/2015  10:19 AM

## 2015-09-19 ENCOUNTER — Ambulatory Visit: Payer: BLUE CROSS/BLUE SHIELD | Admitting: Occupational Therapy

## 2015-09-19 DIAGNOSIS — M6281 Muscle weakness (generalized): Secondary | ICD-10-CM

## 2015-09-19 DIAGNOSIS — R209 Unspecified disturbances of skin sensation: Secondary | ICD-10-CM

## 2015-09-19 DIAGNOSIS — R29898 Other symptoms and signs involving the musculoskeletal system: Secondary | ICD-10-CM

## 2015-09-19 NOTE — Patient Instructions (Signed)
Wear wrist brace for lifting of lightweight items each day(avoid breakable or heavy items)  Perform isometric wrist extension then wrist then flexion 10 reps 1-2x day with wrist in neutral position  Practice stacking and manipulating coins in hand

## 2015-09-20 ENCOUNTER — Ambulatory Visit
Admission: RE | Admit: 2015-09-20 | Discharge: 2015-09-20 | Disposition: A | Discharge status: Home / Self Care | Payer: BLUE CROSS/BLUE SHIELD | Source: Ambulatory Visit | Attending: Radiation Oncology | Admitting: Radiation Oncology

## 2015-09-20 ENCOUNTER — Ambulatory Visit: Payer: BLUE CROSS/BLUE SHIELD | Admitting: Radiation Oncology

## 2015-09-20 ENCOUNTER — Inpatient Hospital Stay
Admission: RE | Admit: 2015-09-20 | Payer: BLUE CROSS/BLUE SHIELD | Source: Ambulatory Visit | Admitting: Radiation Oncology

## 2015-09-20 NOTE — Therapy (Signed)
[facility] [address] [place], [place], [address] Phone: [phone]   Fax:  [phone]  Occupational Therapy Treatment  Patient Details  Name: Joel Miller MRN: [id number] Date of Birth: January 01, 1956 Referring Provider: [name]  Encounter Date: 09/19/2015      OT End of Session - 09/19/15 1553    Visit Number 8   Number of Visits 17   Date for OT Re-Evaluation 09/22/15   Authorization Type BCBS   Authorization Time Period week 6/8   OT Start Time 1535   OT Stop Time 1615   OT Time Calculation (min) 40 min   Activity Tolerance Patient tolerated treatment well   Behavior During Therapy [facility] for tasks assessed/performed      Past Medical History  Diagnosis Date  . Hyperlipidemia   . Obesity     lost 80 lbs  . Hypogonadism male   . Vitamin D deficiency   . Esophageal cancer ([place]) 05/18/14    Distal  . Allergy     HEY FEVER  . GERD (gastroesophageal reflux disease)     pt feels it was related to cancer  . S/P radiation therapy 06/11/2014-07/18/2014  . Hypertension     no med in 46months due to weight loss  . Diabetes mellitus without complication ([place])     type II no med 60 days   . Pleural effusion     Past Surgical History  Procedure Laterality Date  . Colonoscopy    . 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 Complete esophagectomy N/A 09/05/2014    Procedure: TRANSHIATAL TOTAL ESOPHAGECTOMY; Cervical esophagogastrostomy;  Surgeon: [name], MD;  Location: [facility];  Service: Thoracic;  Laterality: N/A;  . Video bronchoscopy N/A 09/05/2014    Procedure: VIDEO BRONCHOSCOPY;  Surgeon: [name], MD;  Location: [facility];  Service: Thoracic;  Laterality: N/A;  . Jejunostomy N/A 09/05/2014    Procedure: FEEDING JEJUNOSTOMY;  Surgeon: [name], MD;  Location: [facility];  Service: Thoracic;  Laterality: N/A;  . Pyloroplasty N/A 09/05/2014    Procedure: PYLOROPLASTY;  Surgeon: [name], MD;  Location: [facility];  Service: Thoracic;  Laterality: N/A;  . Wisdom tooth extraction      2010  . Chest tube insertion Right 05/24/2015    Procedure: INSERTION PLEURAL DRAINAGE CATHETER;  Surgeon: [name], MD;  Location: [facility];  Service: Thoracic;  Laterality: Right;  . Subxyphoid pericardial window N/A 07/03/2015    Procedure: SUBXYPHOID PERICARDIAL WINDOW;  Surgeon: [name], MD;  Location: [facility];  Service: Thoracic;  Laterality: N/A;  . Chest tube insertion Left 07/03/2015    Procedure: INSERTION PLEURAL DRAINAGE CATHETER;  Surgeon: [name], MD;  Location: [facility];  Service: Thoracic;  Laterality: Left;  . Cardioversion N/A 07/04/2015    Procedure: Bedside CARDIOVERSION;  Surgeon: Josue Hector, MD;  Location: [facility];  Service: Cardiovascular;  Laterality: N/A;  . Esophagogastroduodenoscopy N/A 07/05/2015    Procedure: ESOPHAGOGASTRODUODENOSCOPY (EGD);  Surgeon: Gatha Mayer, MD;  Location: [facility] ENDOSCOPY;  Service: Endoscopy;  Laterality: N/A;    There were no vitals filed for this visit.  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 No/denies        Treatment: NMES 50 pps, 250 pw, 10 secs cycle, intensity 21 to wrist and finger extensors x 10 mins  with pt attempting to hold during rest cycle, no adverse reaction.  A/ROM wrist extension, then pt was educated in isometric exercises for wrist flexion/ extension with wrist in neurtral position. Pt was issued a d-ring wrist brace for improved functional positioning during functional activity.  Pt performed functional sustained pinch of graded clothespins to place on vertical antennae with RUE while wearing wrist brace. Pt  practiced picking up, stacking and manipulating coins in hand with min- mod difficulty.                          OT Short Term Goals - 08/27/15 1602    OT SHORT TERM GOAL #1   Title Patient will be independent with HEP   Time 4   Period Weeks   Status Achieved  08/27/15   OT SHORT TERM GOAL #2   Title Patient will be able to effectively don/doff wrist/hand orthosis, and demonstrate undersatnding of wearing schedule   Time 4   Period Weeks   Status Achieved   OT SHORT TERM GOAL #3   Title Patient will demonstrate increase by 5 lb to right gross grasp strength to aide with functional grasp   Baseline 20 lbs   Time 4   Period Weeks   Status On-going  16lbs 08/27/15   OT [place] #4   Title Patient will increase lateral pinch strength in right hand by 2 lbs to increase ability to pick up and hold small objects in dominant hand   Baseline 7 lbs   Time 4   Period Weeks   Status Achieved  08/27/15  10lbs           OT Long Term Goals - 09/19/15 1606    OT LONG TERM GOAL #1   Title Patient will be independent with upgraded HEP   OT LONG TERM GOAL #2   Title Patient will demonstrate 10 lb increase form baseline gross grasp with right hand  upgrade to 48 lbs grip strenght as new goal- 09/19/15)   Baseline 43 lbs 09/19/15   Status Achieved   OT LONG TERM GOAL #3   Title Patient will demonstrate 5 lb increase from baseline for lateral pinch in right hand   Baseline 12 lbs 09/19/15   Status Achieved   OT LONG TERM GOAL #4   Title Patient will demonstrate effective technique to tie shoes, button shirt/ pants, buckle belt with either bimanual technique or effective adaptation   Status Achieved   OT LONG TERM GOAL #5   Title Patient will demonstrate effective computer  mouse use with right UE   Status Achieved   OT LONG TERM GOAL #6   Title Patient will don/ doff front opening shirt or jacket without assistance in less than one minute (excludes zippers and  buttons)   Status On-going               Plan - 09/19/15 1549    Clinical Impression Statement Pt is progressing towards goals with increasing strength and RUE functional use .   Pt will benefit from skilled therapeutic intervention in order to improve on the following deficits (Retired) Decreased activity tolerance;Decreased knowledge of use of DME;Decreased coordination;Decreased range of motion;Decreased strength;Impaired UE functional use;Impaired sensation  Rehab Potential Good   OT Frequency 2x / week   OT Duration 8 weeks   Consulted and Agree with Plan of Care Patient        Problem List Patient Active Problem List   Diagnosis Date Noted  . HTN (hypertension) 09/13/2015  . Vitamin D deficiency 09/13/2015  . Medication management 09/13/2015  . Mixed hyperlipidemia 09/13/2015  . Chemotherapeutic agent or infusion extravasation 07/17/2015  . PAF (paroxysmal atrial fibrillation) ([place]) 07/03/2015  . Anemia of chronic disease 07/02/2015  . Bilateral pleural effusion 07/02/2015  . Protein-calorie malnutrition, severe ([place]) 07/02/2015  . DVT (deep venous thrombosis) ([place]) 07/01/2015  . Acute pulmonary embolism ([place]) 07/01/2015  . Pericardial effusion 07/01/2015  . Esophageal cancer ([place]) 09/05/2014  . 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 GERD 04/22/2014  . Diabetes mellitus without complication ([place])   . Testosterone Deficiency     RINE,KATHRYN 09/20/2015, 8:45 AM Theone Murdoch, OTR/L [phone] Phone: [phone] 8:46 AM 09/20/2015 [facility] [address] [place], [place], [address] Phone: [phone]   Fax:  [phone]  Name: Joel Miller MRN: [id number] Date of Birth: Oct 26, 1955

## 2015-09-24 ENCOUNTER — Ambulatory Visit: Payer: BLUE CROSS/BLUE SHIELD | Admitting: Occupational Therapy

## 2015-09-24 DIAGNOSIS — M6281 Muscle weakness (generalized): Secondary | ICD-10-CM

## 2015-09-24 DIAGNOSIS — R5381 Other malaise: Secondary | ICD-10-CM

## 2015-09-24 DIAGNOSIS — R209 Unspecified disturbances of skin sensation: Secondary | ICD-10-CM

## 2015-09-24 DIAGNOSIS — R29898 Other symptoms and signs involving the musculoskeletal system: Secondary | ICD-10-CM

## 2015-09-24 NOTE — Therapy (Signed)
[facility] [address] [place], [place], [address] Phone: [phone]   Fax:  [phone]  Occupational Therapy Treatment  Patient Details  Name: Joel Miller MRN: [id number] Date of Birth: [birth year] Referring Provider: [name]  Encounter Date: 09/24/2015      OT End of Session - 09/24/15 1409    Visit Number 9   Number of Visits 17   Date for OT Re-Evaluation 10/04/15  extend due to missed visits    Authorization Type BCBS   Authorization Time Period week 7/8   OT Start Time 1402   OT Stop Time 1440   OT Time Calculation (min) 38 min   Activity Tolerance Patient tolerated treatment well   Behavior During Therapy [facility] for tasks assessed/performed      Past Medical History  Diagnosis Date  . Hyperlipidemia   . Obesity     lost 80 lbs  . Hypogonadism male   . Vitamin D deficiency   . Esophageal cancer ([place]) 05/18/14    Distal  . Allergy     HEY FEVER  . GERD (gastroesophageal reflux disease)     pt feels it was related to cancer  . S/P radiation therapy 06/11/2014-07/18/2014  . Hypertension     no med in 16months due to weight loss  . Diabetes mellitus without complication ([place])     type II no med 60 days   . Pleural effusion     Past Surgical History  Procedure Laterality Date  . Colonoscopy    . 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 Complete esophagectomy N/A 09/05/2014    Procedure: TRANSHIATAL TOTAL ESOPHAGECTOMY; Cervical esophagogastrostomy;  Surgeon: [name], MD;  Location: [facility];  Service: Thoracic;  Laterality: N/A;  . Video bronchoscopy N/A 09/05/2014    Procedure: VIDEO BRONCHOSCOPY;  Surgeon: [name], MD;  Location: [facility];  Service: Thoracic;  Laterality: N/A;  . Jejunostomy N/A 09/05/2014    Procedure: FEEDING JEJUNOSTOMY;  Surgeon: [name], MD;  Location: [facility];  Service: Thoracic;  Laterality: N/A;  . Pyloroplasty N/A 09/05/2014    Procedure:  PYLOROPLASTY;  Surgeon: [name], MD;  Location: [facility];  Service: Thoracic;  Laterality: N/A;  . Wisdom tooth extraction      2010  . Chest tube insertion Right 05/24/2015    Procedure: INSERTION PLEURAL DRAINAGE CATHETER;  Surgeon: [name], MD;  Location: [facility];  Service: Thoracic;  Laterality: Right;  . Subxyphoid pericardial window N/A 07/03/2015    Procedure: SUBXYPHOID PERICARDIAL WINDOW;  Surgeon: [name], MD;  Location: [facility];  Service: Thoracic;  Laterality: N/A;  . Chest tube insertion Left 07/03/2015    Procedure: INSERTION PLEURAL DRAINAGE CATHETER;  Surgeon: [name], MD;  Location: [facility];  Service: Thoracic;  Laterality: Left;  . Cardioversion N/A 07/04/2015    Procedure: Bedside CARDIOVERSION;  Surgeon: Josue Hector, MD;  Location: [facility];  Service: Cardiovascular;  Laterality: N/A;  . Esophagogastroduodenoscopy N/A 07/05/2015    Procedure: ESOPHAGOGASTRODUODENOSCOPY (EGD);  Surgeon: Gatha Mayer, MD;  Location: [facility] ENDOSCOPY;  Service: Endoscopy;  Laterality: N/A;    There were no vitals filed for this visit.  Visit Diagnosis:  Muscle weakness of right arm  Weakness of right hand  Sensory disturbance  Physical deconditioning    Treatment: NMES 50 pps, 250 pw, 10 secs cycle, intensity 20 x 10 mins to wrist and finger extensors. Isometrics for wrist extension  and flexion, radial deviation 5 reps each, hold x 5 secs, A/ROM wrist extension, holding at end range then neurtral 2 sets of 5 reps  Gripper set at 15 lbs for sustained grip to pick up 1 inch blocks while wearing wrist brace. Red putty for grip and tip pinch.                          OT Short Term Goals - 08/27/15 1602    OT SHORT TERM GOAL #1   Title Patient will be independent with HEP   Time 4   Period Weeks   Status Achieved  08/27/15   OT SHORT TERM GOAL #2   Title Patient will be able to effectively don/doff wrist/hand orthosis, and  demonstrate undersatnding of wearing schedule   Time 4   Period Weeks   Status Achieved   OT SHORT TERM GOAL #3   Title Patient will demonstrate increase by 5 lb to right gross grasp strength to aide with functional grasp   Baseline 20 lbs   Time 4   Period Weeks   Status On-going  16lbs 08/27/15   OT [place] #4   Title Patient will increase lateral pinch strength in right hand by 2 lbs to increase ability to pick up and hold small objects in dominant hand   Baseline 7 lbs   Time 4   Period Weeks   Status Achieved  08/27/15  10lbs           OT Long Term Goals - 09/19/15 1606    OT LONG TERM GOAL #1   Title Patient will be independent with upgraded HEP   OT LONG TERM GOAL #2   Title Patient will demonstrate 10 lb increase form baseline gross grasp with right hand  upgrade to 48 lbs grip strenght as new goal- 09/19/15)   Baseline 43 lbs 09/19/15   Status Achieved   OT LONG TERM GOAL #3   Title Patient will demonstrate 5 lb increase from baseline for lateral pinch in right hand   Baseline 12 lbs 09/19/15   Status Achieved   OT LONG TERM GOAL #4   Title Patient will demonstrate effective technique to tie shoes, button shirt/ pants, buckle belt with either bimanual technique or effective adaptation   Status Achieved   OT LONG TERM GOAL #5   Title Patient will demonstrate effective computer  mouse use with right UE   Status Achieved   OT LONG TERM GOAL #6   Title Patient will don/ doff front opening shirt or jacket without assistance in less than one minute (excludes zippers and buttons)   Status On-going               Plan - 09/24/15 1731    Clinical Impression Statement Pt is progressing towards goals with improving ability to perform A/ROM wrist extension.   Pt will benefit from skilled therapeutic intervention in order to improve on the following deficits (Retired) Decreased activity tolerance;Decreased knowledge of use of DME;Decreased  coordination;Decreased range of motion;Decreased strength;Impaired UE functional use;Impaired sensation   Rehab Potential Good   OT Frequency 2x / week   OT Duration 8 weeks   OT Treatment/Interventions Self-care/ADL training;Electrical Stimulation;Fluidtherapy;Moist Heat;Therapeutic exercise;Neuromuscular education;DME and/or AE instruction;Manual Therapy;Splinting;Patient/family education;Therapeutic exercises;Therapeutic activities;Ultrasound;Biofeedback   Plan progress towards goals        Problem List Patient Active Problem List   Diagnosis Date Noted  . HTN (hypertension) 09/13/2015  .  Vitamin D deficiency 09/13/2015  . Medication management 09/13/2015  . Mixed hyperlipidemia 09/13/2015  . Chemotherapeutic agent or infusion extravasation 07/17/2015  . PAF (paroxysmal atrial fibrillation) ([place]) 07/03/2015  . Anemia of chronic disease 07/02/2015  . Bilateral pleural effusion 07/02/2015  . Protein-calorie malnutrition, severe ([place]) 07/02/2015  . DVT (deep venous thrombosis) ([place]) 07/01/2015  . Acute pulmonary embolism ([place]) 07/01/2015  . Pericardial effusion 07/01/2015  . Esophageal cancer ([place]) 09/05/2014  . 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 GERD 04/22/2014  . Diabetes mellitus without complication ([place])   . Testosterone Deficiency     RINE,KATHRYN 09/24/2015, 5:34 PM Theone Murdoch, OTR/L [phone] Phone: [phone] 5:34 PM 09/24/2015 [facility] [address] [place], [place], [address] Phone: [phone]   Fax:  [phone]  Name: Joel Miller MRN: [id number] Date of Birth: November 28, 1955

## 2015-09-26 ENCOUNTER — Encounter: Payer: BLUE CROSS/BLUE SHIELD | Admitting: Occupational Therapy

## 2015-09-26 ENCOUNTER — Other Ambulatory Visit: Payer: Self-pay | Admitting: Oncology

## 2015-10-01 ENCOUNTER — Encounter: Payer: BLUE CROSS/BLUE SHIELD | Admitting: Occupational Therapy

## 2015-10-02 ENCOUNTER — Other Ambulatory Visit: Payer: Self-pay | Admitting: *Deleted

## 2015-10-02 ENCOUNTER — Telehealth: Payer: Self-pay | Admitting: *Deleted

## 2015-10-02 ENCOUNTER — Other Ambulatory Visit: Payer: Self-pay | Admitting: Cardiothoracic Surgery

## 2015-10-02 DIAGNOSIS — C155 Malignant neoplasm of lower third of esophagus: Secondary | ICD-10-CM

## 2015-10-02 DIAGNOSIS — C159 Malignant neoplasm of esophagus, unspecified: Secondary | ICD-10-CM

## 2015-10-02 MED ORDER — OXYCODONE-ACETAMINOPHEN 5-325 MG PO TABS: 1.0000 | ORAL_TABLET | ORAL | Status: DC | PRN

## 2015-10-02 MED ORDER — PROCHLORPERAZINE MALEATE 10 MG PO TABS: 10.0000 mg | ORAL_TABLET | Freq: Four times a day (QID) | ORAL | Status: AC | PRN

## 2015-10-02 NOTE — Telephone Encounter (Signed)
"   I need refills on my Percocet and compazine prescribed by Dr. Benay Spice."

## 2015-10-02 NOTE — Telephone Encounter (Signed)
Notified pt that refill for Percocet is ready for p/u and compazine refill has been sent to pharmacy.  Pt verbalized understanding and confirmed appt for 2/21.

## 2015-10-03 ENCOUNTER — Ambulatory Visit (INDEPENDENT_AMBULATORY_CARE_PROVIDER_SITE_OTHER): Payer: Self-pay | Admitting: Cardiothoracic Surgery

## 2015-10-03 ENCOUNTER — Ambulatory Visit
Admission: RE | Admit: 2015-10-03 | Discharge: 2015-10-03 | Disposition: A | Discharge status: Home / Self Care | Payer: BLUE CROSS/BLUE SHIELD | Source: Ambulatory Visit | Attending: Cardiothoracic Surgery | Admitting: Cardiothoracic Surgery

## 2015-10-03 ENCOUNTER — Encounter: Payer: Self-pay | Admitting: Cardiothoracic Surgery

## 2015-10-03 ENCOUNTER — Encounter: Payer: BLUE CROSS/BLUE SHIELD | Admitting: Occupational Therapy

## 2015-10-03 VITALS — BP 124/87 | HR 88 | Resp 20 | Ht 70.0 in | Wt 161.0 lb

## 2015-10-03 DIAGNOSIS — C155 Malignant neoplasm of lower third of esophagus: Secondary | ICD-10-CM

## 2015-10-03 DIAGNOSIS — I313 Pericardial effusion (noninflammatory): Secondary | ICD-10-CM

## 2015-10-03 DIAGNOSIS — I319 Disease of pericardium, unspecified: Secondary | ICD-10-CM

## 2015-10-03 DIAGNOSIS — J90 Pleural effusion, not elsewhere classified: Secondary | ICD-10-CM

## 2015-10-03 DIAGNOSIS — Z09 Encounter for follow-up examination after completed treatment for conditions other than malignant neoplasm: Secondary | ICD-10-CM

## 2015-10-03 DIAGNOSIS — C159 Malignant neoplasm of esophagus, unspecified: Secondary | ICD-10-CM

## 2015-10-03 DIAGNOSIS — I3139 Other pericardial effusion (noninflammatory): Secondary | ICD-10-CM

## 2015-10-03 NOTE — Progress Notes (Signed)
[address]       [place], [address]             [phone]      [facility] Medical Record [id number] Date of Birth: 1956/06/06  Referring: [name], MD Primary Care: [name], MD  Chief Complaint:   POST OP FOLLOW UP 07/03/2015 OPERATIVE REPORT PREOPERATIVE DIAGNOSES: History of esophageal cancer with recent pulmonary embolus, large pericardial effusion and increasing left pleural effusion suspected to be malignant effusion. POSTOPERATIVE DIAGNOSES: History of esophageal cancer with recent pulmonary embolus, large pericardial effusion and increasing left pleural effusion suspected to be malignant effusion. PROCEDURE: Subxiphoid pericardial window with drainage of pericardial effusion. Left PleurX catheter and drainage of left pleural effusion with ultrasound fluoroscopic guidance. 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 SURGICAL PROCEDURE: Video bronchoscopy, transhiatal total esophagectomy with cervical esophagogastrostomy, pyloroplasty, and feeding jejunostomy. 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 SURGICAL PROCEDURE: Both fluoroscopic and ultrasound guidance, placement of a right PleurX catheter, and drainage of right  pleural fluid. SURGEON: [name], MD     History of Present Illness:    Patient returns today for follow-up visit after removal of  bilateral Pleurx catheters,  He continues to do recently well but is bothered by fatigue and difficulty sleeping. He remains somewhat discouraged today, he came to the office thinking his chest x-ray was went show increased fluid because of increasing symptoms of dyspnea with exertion. Though he does note he continues to work about 10 hours a week. Is bothered by back pain and cough.     Past Medical History  Diagnosis Date  . Hyperlipidemia   . Obesity     lost 80 lbs  . Hypogonadism male   . Vitamin D deficiency   . Esophageal cancer ([facility] [facility]) 05/18/14    Distal  . Allergy     HEY FEVER  . GERD (gastroesophageal reflux disease)     pt feels it was related to cancer  . S/P radiation therapy 06/11/2014-07/18/2014  . Hypertension     no med in 33months due to weight loss  . Diabetes mellitus without complication ([facility])     type II no med 60 days   . Pleural effusion      History  Smoking status  . Former Smoker -- 0.25 packs/day for 3 years  . Types: Cigars  Smokeless tobacco  . Never Used    Comment: no cigars for one year    History  Alcohol Use  . 1.2 oz/week  . 2 Standard drinks or equivalent per week    Comment: social     Allergies  Allergen Reactions  . Ace Inhibitors     cough    Current Outpatient Prescriptions  Medication Sig Dispense Refill  . amiodarone (PACERONE) 200 MG tablet Take 1 tablet (200 mg total) by mouth daily. 30 tablet  11  . feeding supplement (BOOST / RESOURCE BREEZE) LIQD Take 1 Container by mouth 3 (three) times daily between meals.  0  . gabapentin (NEURONTIN) 300 MG capsule   1  . levothyroxine (SYNTHROID, LEVOTHROID) 50 MCG tablet Take 1 tablet daily on an empty stomach for 30 minutes 90 tablet 1  . LORazepam (ATIVAN) 0.5 MG tablet Take 1 tablet (0.5 mg total) by mouth every 6 (six) hours as  needed for anxiety. 60 tablet 1  . oxazepam (SERAX) 15 MG capsule TAKE 1 TO 2 CAPSULES BY MOUTH 1 HOUR PRIOR TO BEDTIME FOR SLEEP IF NEEDED. *STOP TAKING ZOLPIDEM* 60 capsule 2  . oxyCODONE-acetaminophen (PERCOCET/ROXICET) 5-325 MG tablet Take 1 tablet by mouth every 4 (four) hours as needed for moderate pain or severe pain. 100 tablet 0  . prochlorperazine (COMPAZINE) 10 MG tablet Take 1 tablet (10 mg total) by mouth every 6 (six) hours as needed for nausea. 30 tablet 1  . XARELTO 20 MG TABS tablet TAKE 1 TABLET (20 MG TOTAL) BY MOUTH DAILY WITH SUPPER. 30 tablet 1  . zolpidem (AMBIEN CR) 12.5 MG CR tablet   4   No current facility-administered medications for this visit.   Wt Readings from Last 3 Encounters:  10/03/15 161 lb (73.029 kg)  09/18/15 162 lb 11.2 oz (73.8 kg)  09/13/15 163 lb (73.936 kg)   Physical Exam: BP 124/87 mmHg  Pulse 88  Resp 20  Ht 5\' 10"  (1.778 m)  Wt 161 lb (73.029 kg)  BMI 23.10 kg/m2  SpO2 93%  General appearance: alert, cooperative and appears stated age, but with significant cachexia Neurologic: intact Heart: regular rate and rhythm, S1, S2 normal, no murmur, click, rub or gallop Lungs: clear to auscultation on the left but decreased at the right base Abdomen: soft, non-tender; bowel sounds normal; no masses,  no organomegaly and Abdominal wound is healing well Extremities: extremities normal, atraumatic, no cyanosis or edema and Homans sign is negative, no sign of DVT Wound: Left neck incision is healing well I do not appreciate any cervical or supraclavicular adenopathy  Both Pleurx catheters have been removed  Diagnostic Studies & Laboratory data:     Recent Radiology Findings:  Dg Chest 2 View  10/03/2015  CLINICAL DATA:  History of esophageal carcinoma. EXAM: CHEST  2 VIEW COMPARISON:  Chest radiograph August 23, 2015 and chest CT August 29, 2015 FINDINGS: There are persistent bilateral pleural effusions with bibasilar atelectatic type  change. There is ill-defined opacity in the right mid lung, likely representing chronic atelectatic type change. No new opacity is seen. The heart size and pulmonary vascularity are within normal limits. Ill-defined opacity in the retrotracheal region on the lateral view is likely due to postoperative change, stable. No bone lesions. No adenopathy. IMPRESSION: Stable bilateral pleural effusions and areas of atelectatic change. No change in cardiac silhouette. No new opacity. Electronically Signed   By: Lowella Grip III M.D.   On: 10/03/2015 11:25     Ct Chest W Contrast  08/29/2015  CLINICAL DATA:  Esophageal cancer post surgery, chemotherapy and radiation therapy EXAM: CT CHEST WITH CONTRAST TECHNIQUE: Multidetector CT imaging of the chest was performed during intravenous contrast administration. Sagittal and coronal MPR images reconstructed from axial data set. CONTRAST:  52mL ISOVUE-300 IOPAMIDOL (ISOVUE-300) INJECTION 61% IV COMPARISON:  07/01/2015 FINDINGS: Thoracic vascular structures grossly patent on nondedicated exam. Post esophagectomy with gastric pull-up. Difficult to exclude wall thickening of the distal stomach at the inferior mediastinum. Enlarged AP  window lymph node 12 mm short axis image 24 previously 16 mm. 8 mm prevascular node image 15 not seen previously. 10 mm short axis RIGHT paratracheal node image 23 previously 11 mm. Decreased pericardial effusion post interval pericardial window procedure. BILATERAL pleural effusions which appear partially loculated. Scattered areas of pleural thickening/ enhancement are seen bilaterally. Previously identified RIGHT thoracostomy tube no longer seen. Foci of air are seen within the pleural collections bilaterally, diffusely at the largest collection at the lower RIGHT chest and laterally at the inferior LEFT chest ; these could be the result of recent intervention/aspiration/ drainage catheter placement or could represent infection/empyema. No  enhancing pleural nodularity identified. Few small nodular foci identified in RIGHT upper lobe image 23 and 21, stable. Scattered areas of atelectasis throughout both lungs, greater in lower lobes. Loculated pleural effusion within the fissures. High attenuation focus within the posterior RIGHT lobe liver on the final image number 64, 3.3 x 2.9 cm, could represent transient hepatic attenuation difference or subtle enhancing mass. Thickening of adrenal glands bilaterally without discrete nodule. Atrophic lateral segment LEFT lobe liver. Remaining visualized portion of upper abdomen unremarkable. No worrisome osseous findings. IMPRESSION: Post esophagectomy with gastric pull-up. BILATERAL partially loculated pleural collections bilaterally with scattered pleural thickening/enhancement and multiple foci of gas RIGHT greater than LEFT; these gas foci could be due to prior thoracentesis or drainage catheter placement but empyema not excluded. Decreased size of AP window lymph node. Stable pulmonary nodules RIGHT upper lobe with scattered atelectasis throughout both lungs. Markedly decreased pericardial effusion by history post pericardial window. Questionable mass lesion versus transient hepatic attenuation difference at RIGHT lobe liver, incompletely evaluated on last image; followup MR imaging with and without contrast recommended to characterize and exclude hepatic metastasis. Electronically Signed   By: Lavonia Dana M.D.   On: 08/29/2015 09:11   Dg Chest 2 View  06/13/2015   CLINICAL DATA:  History of esophageal malignancy with surgery in January of 2016, history of pleural effusions, currently asymptomatic.  EXAM: CHEST  2 VIEW  COMPARISON:  Portable chest x-ray of May 24, 2015  FINDINGS: There remains a smaller moderate-sized right pleural effusion. The small caliber right chest tube is unchanged in position at the inferior aspect of the pleural space. A small left pleural effusion has increased in size  since September. There is no pneumothorax. The pulmonary interstitial markings are slightly more conspicuous today especially on the left. The heart is normal in size. The pulmonary vascularity is not engorged. The bony thorax exhibits no acute abnormality.  IMPRESSION: 1. Mildly increased pulmonary interstitial markings especially on the left since the previous study. This may reflect interstitial edema or early interstitial pneumonia. 2. Stable small a moderate sized right pleural effusion with stable small caliber chest tube. Slight interval increase in the volume of the small left pleural effusion.   Electronically Signed   By: David  Martinique M.D.   On: 06/13/2015 14:22   Ct Angio Chest Pe W/cm &/or Wo Cm  05/14/2015   CLINICAL DATA:  Cough and shortness of breath for 1 week. History of esophageal carcinoma diagnosed in the fall of 2015.  EXAM: CT ANGIOGRAPHY CHEST WITH CONTRAST  TECHNIQUE: Multidetector CT imaging of the chest was performed using the standard protocol during bolus administration of intravenous contrast. Multiplanar CT image reconstructions and MIPs were obtained to evaluate the vascular anatomy.  CONTRAST:  80 mL OMNIPAQUE IOHEXOL 350 MG/ML SOLN  COMPARISON:  CT chest and abdomen 01/31/2015 and PA and lateral  chest 04/25/2015.  FINDINGS: No pulmonary embolus is identified. The patient has a massive right pleural effusion which has markedly increased since the prior plain films. A smaller left pleural effusion is identified. There is no pericardial effusion. Heart size is normal.  Postoperative change of esophagectomy and gastric pull-through is again seen. There is new mediastinal lymphadenopathy. Index AP window node on image 34 measures 1.7 cm in diameter. A prevascular node on image 33 is barely perceptible on the prior examination and now measures 1.0 cm short axis dimension on image 33. There is also new and extensive juxtaphrenic lymphadenopathy best seen on the right. Index node on  image 79 measures 3.1 x 2.3 cm. Pleural thickening and nodularity are identified on the right most consistent with metastatic disease.  The right lung is nearly completely collapsed secondary to compressive atelectasis from the patient's effusion. A new left lower lobe pulmonary nodule image 60 measures 0.7 cm. A second new left lower lobe pulmonary nodule on image 62 measures 0.9 cm in diameter. A left upper lobe nodule on image 39 measures 0.5 cm in diameter. Several additional smaller nodules are seen in the left lung.  Imaged upper abdomen demonstrates a new left adrenal nodule measuring 2.9 x 1.7 cm on image 95. There is some haziness of the mesenteric about the pancreas which is also new since the prior examination. Imaged upper abdomen is otherwise unremarkable. No lytic or sclerotic bony lesions identified.  Review of the MIP images confirms the above findings.  IMPRESSION: Negative for pulmonary embolus.  Findings consistent with extensive new metastatic esophageal carcinoma with a very large pleural effusion on the right with pleural nodularity consistent with a malignant effusion. The effusion nearly completely compresses the left lung. There is also new mediastinal and juxtaphrenic lymphadenopathy, left pulmonary nodules and a left adrenal nodule all most consistent with metastatic disease.  Small left pleural effusion.  Stranding of fat about the pancreas is nonspecific and could be due to pancreatitis or mesenteritis.   Electronically Signed   By: Inge Rise M.D.   On: 05/14/2015 10:58   Ct Chest W Contrast  02/01/2015   CLINICAL DATA:  Patient with distal esophageal carcinoma diagnosed 05/18/2014. Status post chemotherapy and radiation. Patient status post esophagectomy 09/05/2014.  EXAM: CT CHEST AND ABDOMEN WITHOUT CONTRAST  TECHNIQUE: Multidetector CT imaging of the chest and abdomen was performed following the standard protocol without intravenous contrast.  COMPARISON:  CT chest  08/09/2014 ; PET-CT 05/29/2014  FINDINGS: CT CHEST FINDINGS  Mediastinum/Nodes: Right anterior chest wall Port-A-Cath is present with tip terminating at the superior cavoatrial junction. No enlarged axillary, mediastinal or hilar lymphadenopathy. Normal heart size. No pericardial effusion. Postoperative changes compatible with esophagectomy and gastric pull-through. No mediastinal fluid collections are identified.  Lungs/Pleura: Central airways are patent. Minimal scarring and or atelectasis medial right lower lobe (image 38; series 4). No large nodular or consolidative pulmonary opacity. Minimal tiny 1 mm nodules within the peripheral lungs bilaterally are unchanged from prior. No pleural effusion or pneumothorax.  Musculoskeletal: Thoracic spine degenerative changes without aggressive or acute appearing osseous lesions.  CT ABDOMEN FINDINGS  Hepatobiliary: Liver is normal in size and contour without focal hepatic lesion identified. Gallbladder is unremarkable. No intrahepatic or extrahepatic biliary ductal dilatation.  Pancreas: Unremarkable  Spleen: Unremarkable  Adrenals/Urinary Tract: Stable thickening of the bilateral adrenal glands. Kidneys enhance symmetrically with contrast. No hydronephrosis.  Stomach/Bowel: Postoperative changes compatible with esophagectomy and gastric pull-through. No evidence for abnormal bowel wall thickening  or bowel obstruction. There is nonspecific mesenteric fat stranding within the visualized upper abdomen. No free intraperitoneal air or fluid.  Vascular/Lymphatic: Normal caliber abdominal aorta. No retroperitoneal lymphadenopathy.  Other: Postsurgical changes midline abdominal soft tissues.  Musculoskeletal: Lower lumbar spine degenerative changes. No aggressive or acute appearing osseous lesions.  IMPRESSION: Patient status post interval esophagectomy and pull-through without evidence for locally recurrent or distant metastatic disease.  Nonspecific mesenteric fat stranding  within the visualized upper abdomen is favored to be postoperative in etiology.   Electronically Signed   By: Lovey Newcomer M.D.   On: 02/01/2015 08:34       Recent Lab Findings: Lab Results  Component Value Date   WBC 7.9 09/13/2015   HGB 10.9* 09/13/2015   HCT 34.9* 09/13/2015   PLT 476* 09/13/2015   GLUCOSE 84 09/13/2015   CHOL 135 09/13/2015   TRIG 80 09/13/2015   HDL 48 09/13/2015   LDLCALC 71 09/13/2015   ALT 5* 09/13/2015   AST 11 09/13/2015   NA 134* 09/13/2015   K 4.7 09/13/2015   CL 96* 09/13/2015   CREATININE 0.79 09/13/2015   BUN 9 09/13/2015   CO2 30 09/13/2015   TSH 7.160* 09/13/2015   INR 1.62* 07/02/2015   HGBA1C 5.4 09/13/2015     Assessment / Plan:      Adenocarcinoma the esophagus/GE junction-status post an transhiatal total esophagectomy for a stage IIIB adenocarcinoma,presumed  Stage IV disease- ct today is stable , if not decrease in size of mediastinal nodes, question of live lesion but not definate Chest x-ray done today appears stable without evidence of increasing pleural effusion, the cardiac silhouette also looks unchanged We discussed techniques to control his symptoms primarily is concerned about cough difficulty sleeping shortness of breath with exertion. He expressed interest in a second opinion for possible esophageal cancer studies and will discuss this with Dr. Benay Spice in the near future.  We'll plan to see him back in 3 weeks with a follow-up chest x-ray  [name] MD      [address] [place] [address] Office [phone]   Beeper [id number]  10/03/2015 11:32 AM

## 2015-10-07 ENCOUNTER — Telehealth: Payer: Self-pay | Admitting: *Deleted

## 2015-10-07 ENCOUNTER — Ambulatory Visit (HOSPITAL_BASED_OUTPATIENT_CLINIC_OR_DEPARTMENT_OTHER): Payer: BLUE CROSS/BLUE SHIELD

## 2015-10-07 ENCOUNTER — Encounter: Payer: Self-pay | Admitting: Nurse Practitioner

## 2015-10-07 ENCOUNTER — Ambulatory Visit (HOSPITAL_BASED_OUTPATIENT_CLINIC_OR_DEPARTMENT_OTHER): Payer: BLUE CROSS/BLUE SHIELD | Admitting: Nurse Practitioner

## 2015-10-07 VITALS — BP 137/90 | HR 94 | Temp 98.0°F | Resp 18 | Ht 70.0 in | Wt 157.3 lb

## 2015-10-07 DIAGNOSIS — C155 Malignant neoplasm of lower third of esophagus: Secondary | ICD-10-CM

## 2015-10-07 DIAGNOSIS — G893 Neoplasm related pain (acute) (chronic): Secondary | ICD-10-CM

## 2015-10-07 DIAGNOSIS — Z7901 Long term (current) use of anticoagulants: Secondary | ICD-10-CM

## 2015-10-07 DIAGNOSIS — E860 Dehydration: Secondary | ICD-10-CM

## 2015-10-07 LAB — CBC WITH DIFFERENTIAL/PLATELET
BASO%: 0.4 % (ref 0.0–2.0)
Basophils Absolute: 0 10*3/uL (ref 0.0–0.1)
EOS ABS: 0 10*3/uL (ref 0.0–0.5)
EOS%: 0.7 % (ref 0.0–7.0)
HEMATOCRIT: 35.9 % — AB (ref 38.4–49.9)
HGB: 11.5 g/dL — ABNORMAL LOW (ref 13.0–17.1)
LYMPH#: 0.4 10*3/uL — AB (ref 0.9–3.3)
LYMPH%: 5 % — ABNORMAL LOW (ref 14.0–49.0)
MCH: 27.2 pg (ref 27.2–33.4)
MCHC: 31.9 g/dL — ABNORMAL LOW (ref 32.0–36.0)
MCV: 85.1 fL (ref 79.3–98.0)
MONO#: 0.6 10*3/uL (ref 0.1–0.9)
MONO%: 8.8 % (ref 0.0–14.0)
NEUT%: 85.1 % — AB (ref 39.0–75.0)
NEUTROS ABS: 6 10*3/uL (ref 1.5–6.5)
PLATELETS: 372 10*3/uL (ref 140–400)
RBC: 4.22 10*6/uL (ref 4.20–5.82)
RDW: 16.5 % — ABNORMAL HIGH (ref 11.0–14.6)
WBC: 7.1 10*3/uL (ref 4.0–10.3)

## 2015-10-07 LAB — COMPREHENSIVE METABOLIC PANEL
ALBUMIN: 3 g/dL — AB (ref 3.5–5.0)
ALK PHOS: 108 U/L (ref 40–150)
ANION GAP: 10 meq/L (ref 3–11)
AST: 13 U/L (ref 5–34)
BUN: 11.1 mg/dL (ref 7.0–26.0)
CALCIUM: 9.8 mg/dL (ref 8.4–10.4)
CO2: 26 mEq/L (ref 22–29)
CREATININE: 0.8 mg/dL (ref 0.7–1.3)
Chloride: 99 mEq/L (ref 98–109)
EGFR: >90 mL/min/{1.73_m2} (ref >90)
Glucose: 105 mg/dl (ref 70–140)
Potassium: 5.1 mEq/L (ref 3.5–5.1)
Sodium: 136 mEq/L (ref 136–145)
TOTAL PROTEIN: 7.1 g/dL (ref 6.4–8.3)

## 2015-10-07 MED ORDER — MORPHINE SULFATE ER 15 MG PO TBCR: 15.0000 mg | EXTENDED_RELEASE_TABLET | Freq: Two times a day (BID) | ORAL | Status: DC

## 2015-10-07 NOTE — Telephone Encounter (Signed)
Patient contacted this nurse directly with concerns of extreme nausea unrelieved by compazine and significant pain in his back. Pt states he has been taking 1 percocet every 4 hours and pain relief only last about 1.5 hours. Decrease in appetite due to nausea. No fevers noted. Pt advised to register for 9am lab/flush and 930 with [facility].

## 2015-10-07 NOTE — Assessment & Plan Note (Signed)
Patient has a history of pulmonary embolism in the past; and continues to take Xarelto on a daily basis.

## 2015-10-07 NOTE — Assessment & Plan Note (Signed)
Patient last received Taxol/Herceptin chemotherapy regimen on 07/29/2015.  He is currently undergoing observation only.  Patient underwent a restaging CT of the chest on 08/29/2015, which appeared stable.  Patient saw his pulmonologist Dr. Servando Snare last week for follow-up; and has plans to see his cardiologist, Dr. Johnsie Cancel on 10/11/2015.  Patient reports an approximately 2-3 week history of increased mid back pain.  He states that the pain radiates to both his left and right flank at times.  He states that he has been taking 1 Percocet tablet on an every four-hour basis to manage his pain; but his pain is progressively worsening.  He states that he becomes slightly nauseous, and begins to cough when it is time for another Percocet tablet.  He denies any known injury or trauma to his back.  He denies any UTI symptoms.  Exam today reveals some tenderness to his low thoracic region; and some left flank discomfort with palpation.  Patient observed with full range of motion.  Patient has a ball that he places to his back to relieve the pressure to his back.  He denies any urinary or stool incontinence.  He also denies any numbness or tingling.  Also, patient continues with chronic right wrist drop; and continues to go to neurological occupation therapy for this.  Dr. Benay Spice and to review all of patient's symptoms with him; and decision was made to order a restaging PET scan for further evaluation.  Patient may very well require a thoracic/lumbar MRI in the future as well.  See further notes regarding pain management.  Patient will be scheduled for restaging PET scan for as soon as possible.  He will need to be scheduled for a follow-up visit to review the scan results a few days after the PET scan.

## 2015-10-07 NOTE — Assessment & Plan Note (Signed)
Patient reports an approximately 2-3 week history of increased mid back pain.  He states that the pain radiates to both his left and right flank at times.  He states that he has been taking 1 Percocet tablet on an every four-hour basis to manage his pain; but his pain is progressively worsening.  He states that he becomes slightly nauseous, and begins to cough when it is time for another Percocet tablet.  He denies any known injury or trauma to his back.  He denies any UTI symptoms.  Exam today reveals some tenderness to his low thoracic region; and some left flank discomfort with palpation.  Patient observed with full range of motion.  Patient has a ball that he places to his back to relieve the pressure to his back.  He denies any urinary or stool incontinence.  He also denies any numbness or tingling.  Also, patient continues with chronic right wrist drop; and continues to go to neurological occupation therapy for this.  Dr. Benay Spice and to review all of patient's symptoms with him; and decision was made to order a restaging PET scan for further evaluation.  Patient may very well require a thoracic/lumbar MRI in the future as well.  On discussion with patient regarding pain management.  Decision was made for patient to try MS Contin 15 mg twice daily; and to use the Percocet for breakthrough pain only.  Patient will be scheduled for restaging PET scan for as soon as possible.  He will need to be scheduled for a follow-up visit to review the scan results a few days after the PET scan.

## 2015-10-07 NOTE — Progress Notes (Signed)
SYMPTOM MANAGEMENT CLINIC   HPI: Joel Miller 60 y.o. male diagnosed with esophageal cancer.  Patient is status post Taxol/Herceptin chemotherapy regimen in November 2016.  Currently undergoing observation only.    Patient reports an approximately 2-3 week history of increased mid back pain.  He states that the pain radiates to both his left and right flank at times.  He states that he has been taking 1 Percocet tablet on an every four-hour basis to manage his pain; but his pain is progressively worsening.  He states that he becomes slightly nauseous, and begins to cough when it is time for another Percocet tablet.  He denies any known injury or trauma to his back.  He denies any UTI symptoms.  Exam today reveals some tenderness to his low thoracic region; and some left flank discomfort with palpation.  Patient observed with full range of motion.  Patient has a ball that he places to his back to relieve the pressure to his back.  He denies any urinary or stool incontinence.  He also denies any numbness or tingling.  Also, patient continues with chronic right wrist drop; and continues to go to neurological occupation therapy for this.  Dr. Benay Spice and to review all of patient's symptoms with him; and decision was made to order a restaging PET scan for further evaluation.  Patient may very well require a thoracic/lumbar MRI in the future as well.  On discussion with patient regarding pain management.  Decision was made for patient to try MS Contin 15 mg twice daily; and to use the Percocet for breakthrough pain only.  Patient will be scheduled for restaging PET scan for as soon as possible.  He will need to be scheduled for a follow-up visit to review the scan results a few days after the PET scan. HPI  Review of Systems  Musculoskeletal: Positive for back pain. Negative for myalgias, joint pain, falls and neck pain.  All other systems reviewed and are negative.   Past Medical  History  Diagnosis Date  . Hyperlipidemia   . Obesity     lost 80 lbs  . Hypogonadism male   . Vitamin D deficiency   . Esophageal cancer ([place]) 05/18/14    Distal  . Allergy     HEY FEVER  . GERD (gastroesophageal reflux disease)     pt feels it was related to cancer  . S/P radiation therapy 06/11/2014-07/18/2014  . Hypertension     no med in 69month due to weight loss  . Diabetes mellitus without complication ([place]     type II no med 60 days   . Pleural effusion     Past Surgical History  Procedure Laterality Date  . Colonoscopy    . 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 Complete esophagectomy N/A 09/05/2014    Procedure: TRANSHIATAL TOTAL ESOPHAGECTOMY; Cervical esophagogastrostomy;  Surgeon: EGrace Isaac MD;  [facility]  Service: Thoracic;  Laterality: N/A;  . Video bronchoscopy N/A 09/05/2014    Procedure: VIDEO BRONCHOSCOPY;  Surgeon: EGrace Isaac MD;  [place] [facility]  Service: Thoracic;  Laterality: N/A;  . Jejunostomy N/A 09/05/2014    Procedure: FEEDING JEJUNOSTOMY;  Surgeon: EGrace Isaac MD;  [place] [facility]  Service: Thoracic;  Laterality: N/A;  . Pyloroplasty N/A 09/05/2014    Procedure: PYLOROPLASTY;  Surgeon: EGrace Isaac MD;  [place] [facility]  Service: Thoracic;  Laterality: N/A;  . Wisdom tooth extraction      2010  .  Chest tube insertion Right 05/24/2015    Procedure: INSERTION PLEURAL DRAINAGE CATHETER;  Surgeon: [name], MD;  Location: [place];  Service: Thoracic;  Laterality: Right;  . Subxyphoid pericardial window N/A 07/03/2015    Procedure: SUBXYPHOID PERICARDIAL WINDOW;  Surgeon: [name], MD;  Location: [place];  Service: Thoracic;  Laterality: N/A;  . Chest tube insertion Left 07/03/2015    Procedure: INSERTION PLEURAL DRAINAGE CATHETER;  Surgeon: [name], MD;  Location: [place];  Service: Thoracic;  Laterality: Left;  . Cardioversion N/A 07/04/2015    Procedure: Bedside CARDIOVERSION;  Surgeon: Josue Hector, MD;  Location: [place];  Service: Cardiovascular;  Laterality: N/A;  . Esophagogastroduodenoscopy N/A 07/05/2015    Procedure: ESOPHAGOGASTRODUODENOSCOPY (EGD);  Surgeon: Gatha Mayer, MD;  Location: [facility] ENDOSCOPY;  Service: Endoscopy;  Laterality: N/A;    has Diabetes mellitus without complication ([place]); Testosterone Deficiency; GERD; Carcinoma of distal third of esophagus ([place]); Esophageal cancer ([place]); DVT (deep venous thrombosis) ([place]); Acute pulmonary embolism ([place]); Pericardial effusion; Anemia of chronic disease; Bilateral pleural effusion; Protein-calorie malnutrition, severe (HCC); PAF (paroxysmal atrial fibrillation) ([place]); HTN (hypertension); Vitamin D deficiency; Medication management; Mixed hyperlipidemia; Long term (current) use of anticoagulants; and Cancer associated pain on his problem list.    is allergic to ace inhibitors.    Medication List       This list is accurate as of: 10/07/15  2:36 PM.  Always use your most recent med list.               amiodarone 200 MG tablet  Commonly known as:  PACERONE  Take 1 tablet (200 mg total) by mouth daily.     feeding supplement Liqd  Take 1 Container by mouth 3 (three) times daily between meals.     gabapentin 300 MG capsule  Commonly known as:  NEURONTIN  Reported on 10/07/2015     levothyroxine 50 MCG tablet  Commonly known as:  SYNTHROID, LEVOTHROID  Take 1 tablet daily on an empty stomach for 30 minutes     LORazepam 0.5 MG tablet  Commonly known as:  ATIVAN  Take 1 tablet (0.5 mg total) by mouth every 6 (six) hours as needed for anxiety.     morphine 15 MG 12 hr tablet  Commonly known as:  MS CONTIN  Take 1 tablet (15 mg total) by mouth every 12 (twelve) hours.     oxazepam 15 MG capsule  Commonly known as:  SERAX  TAKE 1 TO 2 CAPSULES BY MOUTH 1 HOUR PRIOR TO BEDTIME FOR SLEEP IF NEEDED. *STOP TAKING ZOLPIDEM*     oxyCODONE-acetaminophen 5-325 MG tablet  Commonly known as:  PERCOCET/ROXICET  Take 1  tablet by mouth every 4 (four) hours as needed for moderate pain or severe pain.     prochlorperazine 10 MG tablet  Commonly known as:  COMPAZINE  Take 1 tablet (10 mg total) by mouth every 6 (six) hours as needed for nausea.     XARELTO 20 MG Tabs tablet  Generic drug:  rivaroxaban  TAKE 1 TABLET (20 MG TOTAL) BY MOUTH DAILY WITH SUPPER.     zolpidem 12.5 MG CR tablet  Commonly known as:  AMBIEN CR  Reported on 10/07/2015         PHYSICAL EXAMINATION  Oncology Vitals 10/07/2015 10/03/2015  Height 178 cm 178 cm  Weight 71.351 kg 73.029 kg  Weight (lbs) 157 lbs 5 oz 161 lbs  BMI (kg/m2) 22.57 kg/m2 23.1 kg/m2  Temp 98 -  Pulse 94 88  Resp 18 20  SpO2 100 93  BSA (m2) 1.88 m2 1.9 m2   BP Readings from Last 2 Encounters:  10/07/15 137/90  10/03/15 124/87    Physical Exam  Constitutional: He is oriented to person, place, and time and well-developed, well-nourished, and in no distress.  HENT:  Head: Normocephalic and atraumatic.  Mouth/Throat: Oropharynx is clear and moist.  Eyes: Conjunctivae and EOM are normal. Pupils are equal, round, and reactive to light. Right eye exhibits no discharge. Left eye exhibits no discharge. No scleral icterus.  Neck: Normal range of motion.  Pulmonary/Chest: Effort normal. No respiratory distress.  Musculoskeletal: Normal range of motion. He exhibits tenderness. He exhibits no edema.  Tenderness to the low thoracic region and to the left flank area with palpation.  Patient observed with full range of motion.  Patient had a back brace on today as well.  Neurological: He is alert and oriented to person, place, and time. Gait normal.  Skin: Skin is warm and dry.  Psychiatric: Affect normal.  Nursing note and vitals reviewed.   LABORATORY DATA:. Appointment on 10/07/2015  Component Date Value Ref Range Status  . WBC 10/07/2015 7.1  4.0 - 10.3 10e3/uL Final  . NEUT# 10/07/2015 6.0  1.5 - 6.5 10e3/uL Final  . HGB 10/07/2015 11.5* 13.0 -  17.1 g/dL Final  . HCT 10/07/2015 35.9* 38.4 - 49.9 % Final  . Platelets 10/07/2015 372  140 - 400 10e3/uL Final  . MCV 10/07/2015 85.1  79.3 - 98.0 fL Final  . MCH 10/07/2015 27.2  27.2 - 33.4 pg Final  . MCHC 10/07/2015 31.9* 32.0 - 36.0 g/dL Final  . RBC 10/07/2015 4.22  4.20 - 5.82 10e6/uL Final  . RDW 10/07/2015 16.5* 11.0 - 14.6 % Final  . lymph# 10/07/2015 0.4* 0.9 - 3.3 10e3/uL Final  . MONO# 10/07/2015 0.6  0.1 - 0.9 10e3/uL Final  . Eosinophils Absolute 10/07/2015 0.0  0.0 - 0.5 10e3/uL Final  . Basophils Absolute 10/07/2015 0.0  0.0 - 0.1 10e3/uL Final  . NEUT% 10/07/2015 85.1* 39.0 - 75.0 % Final  . LYMPH% 10/07/2015 5.0* 14.0 - 49.0 % Final  . MONO% 10/07/2015 8.8  0.0 - 14.0 % Final  . EOS% 10/07/2015 0.7  0.0 - 7.0 % Final  . BASO% 10/07/2015 0.4  0.0 - 2.0 % Final  . Sodium 10/07/2015 136  136 - 145 mEq/L Final  . Potassium 10/07/2015 5.1  3.5 - 5.1 mEq/L Final  . Chloride 10/07/2015 99  98 - 109 mEq/L Final  . CO2 10/07/2015 26  22 - 29 mEq/L Final  . Glucose 10/07/2015 105  70 - 140 mg/dl Final   Glucose reference range is for nonfasting patients. Fasting glucose reference range is 70- 100.  Marland Kitchen BUN 10/07/2015 11.1  7.0 - 26.0 mg/dL Final  . Creatinine 10/07/2015 0.8  0.7 - 1.3 mg/dL Final  . Total Bilirubin 10/07/2015 <0.30  0.20 - 1.20 mg/dL Final  . Alkaline Phosphatase 10/07/2015 108  40 - 150 U/L Final  . AST 10/07/2015 13  5 - 34 U/L Final  . ALT 10/07/2015 <9  0 - 55 U/L Final  . Total Protein 10/07/2015 7.1  6.4 - 8.3 g/dL Final  . Albumin 10/07/2015 3.0* 3.5 - 5.0 g/dL Final  . Calcium 10/07/2015 9.8  8.4 - 10.4 mg/dL Final  . Anion Gap 10/07/2015 10  3 - 11 mEq/L Final  . EGFR 10/07/2015 >90  >90  ml/min/1.73 m2 Final   eGFR is calculated using the CKD-EPI Creatinine Equation (2009)     RADIOGRAPHIC STUDIES: No results found.  ASSESSMENT/PLAN:    Long term (current) use of anticoagulants Patient has a history of pulmonary embolism in the past; and  continues to take Xarelto on a daily basis.  Carcinoma of distal third of esophagus [facility]) Patient last received Taxol/Herceptin chemotherapy regimen on 07/29/2015.  He is currently undergoing observation only.  Patient underwent a restaging CT of the chest on 08/29/2015, which appeared stable.  Patient saw his pulmonologist Dr. Servando Snare last week for follow-up; and has plans to see his cardiologist, Dr. Johnsie Cancel on 10/11/2015.  Patient reports an approximately 2-3 week history of increased mid back pain.  He states that the pain radiates to both his left and right flank at times.  He states that he has been taking 1 Percocet tablet on an every four-hour basis to manage his pain; but his pain is progressively worsening.  He states that he becomes slightly nauseous, and begins to cough when it is time for another Percocet tablet.  He denies any known injury or trauma to his back.  He denies any UTI symptoms.  Exam today reveals some tenderness to his low thoracic region; and some left flank discomfort with palpation.  Patient observed with full range of motion.  Patient has a ball that he places to his back to relieve the pressure to his back.  He denies any urinary or stool incontinence.  He also denies any numbness or tingling.  Also, patient continues with chronic right wrist drop; and continues to go to neurological occupation therapy for this.  Dr. Benay Spice and to review all of patient's symptoms with him; and decision was made to order a restaging PET scan for further evaluation.  Patient may very well require a thoracic/lumbar MRI in the future as well.  See further notes regarding pain management.  Patient will be scheduled for restaging PET scan for as soon as possible.  He will need to be scheduled for a follow-up visit to review the scan results a few days after the PET scan.  Cancer associated pain Patient reports an approximately 2-3 week history of increased mid back pain.  He  states that the pain radiates to both his left and right flank at times.  He states that he has been taking 1 Percocet tablet on an every four-hour basis to manage his pain; but his pain is progressively worsening.  He states that he becomes slightly nauseous, and begins to cough when it is time for another Percocet tablet.  He denies any known injury or trauma to his back.  He denies any UTI symptoms.  Exam today reveals some tenderness to his low thoracic region; and some left flank discomfort with palpation.  Patient observed with full range of motion.  Patient has a ball that he places to his back to relieve the pressure to his back.  He denies any urinary or stool incontinence.  He also denies any numbness or tingling.  Also, patient continues with chronic right wrist drop; and continues to go to neurological occupation therapy for this.  Dr. Benay Spice and to review all of patient's symptoms with him; and decision was made to order a restaging PET scan for further evaluation.  Patient may very well require a thoracic/lumbar MRI in the future as well.  On discussion with patient regarding pain management.  Decision was made for patient to try MS Contin 15  mg twice daily; and to use the Percocet for breakthrough pain only.  Patient will be scheduled for restaging PET scan for as soon as possible.  He will need to be scheduled for a follow-up visit to review the scan results a few days after the PET scan.  Patient stated understanding of all instructions; and was in agreement with this plan of care. The patient knows to call the clinic with any problems, questions or concerns.   This was a shared visit with Dr. Benay Spice today.  Total time spent with patient was 25 minutes;  with greater than 75 percent of that time spent in face to face counseling regarding patient's symptoms,  and coordination of care and follow up.  Disclaimer:This dictation was prepared with Dragon/digital dictation along  with [employer]. Any transcriptional errors that result from this process are unintentional.  Drue Second, NP 10/07/2015   This was a shared visit with Drue Second. Mr. Yeagley was interviewed and examined. The back pain may be related to a benign musculoskeletal condition versus metastatic esophagus cancer. We reviewed the most recent CT images and there is no apparent explanation for the pain. The pain could be related to progression of disease at the spine, chest wall, or adrenal glands. He will be referred for a restaging PET scan.  Julieanne Manson, M.D.

## 2015-10-08 ENCOUNTER — Ambulatory Visit: Payer: BLUE CROSS/BLUE SHIELD | Admitting: Occupational Therapy

## 2015-10-08 ENCOUNTER — Telehealth: Payer: Self-pay | Admitting: *Deleted

## 2015-10-08 ENCOUNTER — Telehealth: Payer: Self-pay | Admitting: Oncology

## 2015-10-08 NOTE — Telephone Encounter (Signed)
TC to pt to advise PET scan has been moved up to Feb 2. Pt aware of appt and NPO status prior to exam. Pt verbalized an understanding. Suggested to pt to bring pain medication to his appt so he may take medication after the procedure is completed. Advised pt follow up with Lattie Haw or Dr. Benay Spice would be scheduled for Friday 2/3 or Monday.  Pt reports MS Contin resolves pain however he is more groggy and drowsy. Pt states he feels much better and able to be more active but the fatigue prohibits this. He will continue medication as prescribed until his appt on Friday.   Pt was very grateful for the call and further grateful for the [facility] being able to provide care for him in an urgent need.

## 2015-10-08 NOTE — Telephone Encounter (Signed)
Called and spoke with patient and he is aware of his follow up appointment

## 2015-10-08 NOTE — Telephone Encounter (Signed)
Both filled on 10-02-2015.

## 2015-10-09 ENCOUNTER — Telehealth: Payer: Self-pay | Admitting: *Deleted

## 2015-10-09 ENCOUNTER — Telehealth: Payer: Self-pay | Admitting: Nurse Practitioner

## 2015-10-09 ENCOUNTER — Other Ambulatory Visit: Payer: Self-pay | Admitting: *Deleted

## 2015-10-09 ENCOUNTER — Ambulatory Visit: Payer: BLUE CROSS/BLUE SHIELD | Admitting: Cardiovascular Disease

## 2015-10-09 DIAGNOSIS — G893 Neoplasm related pain (acute) (chronic): Secondary | ICD-10-CM

## 2015-10-09 DIAGNOSIS — C155 Malignant neoplasm of lower third of esophagus: Secondary | ICD-10-CM

## 2015-10-09 DIAGNOSIS — I3139 Other pericardial effusion (noninflammatory): Secondary | ICD-10-CM

## 2015-10-09 DIAGNOSIS — I313 Pericardial effusion (noninflammatory): Secondary | ICD-10-CM

## 2015-10-09 NOTE — Telephone Encounter (Signed)
TC to pt- advised CT scans set up for Thursday am. Pt aware to start drinking contrast at 5am. Pt will obtain contrast this afternoon.

## 2015-10-09 NOTE — Telephone Encounter (Signed)
Spoke with patient to confirm appointment Feb 2 @ 245 pm per 2/1 pof

## 2015-10-10 ENCOUNTER — Ambulatory Visit (HOSPITAL_COMMUNITY)
Admission: RE | Admit: 2015-10-10 | Discharge: 2015-10-10 | Disposition: A | Discharge status: Home / Self Care | Payer: BLUE CROSS/BLUE SHIELD | Source: Ambulatory Visit | Attending: Nurse Practitioner | Admitting: Nurse Practitioner

## 2015-10-10 ENCOUNTER — Encounter (HOSPITAL_COMMUNITY): Payer: BLUE CROSS/BLUE SHIELD

## 2015-10-10 ENCOUNTER — Encounter: Payer: BLUE CROSS/BLUE SHIELD | Admitting: Occupational Therapy

## 2015-10-10 ENCOUNTER — Ambulatory Visit (HOSPITAL_COMMUNITY): Payer: BLUE CROSS/BLUE SHIELD

## 2015-10-10 ENCOUNTER — Ambulatory Visit (HOSPITAL_BASED_OUTPATIENT_CLINIC_OR_DEPARTMENT_OTHER): Payer: BLUE CROSS/BLUE SHIELD | Admitting: Nurse Practitioner

## 2015-10-10 ENCOUNTER — Encounter (HOSPITAL_COMMUNITY): Payer: Self-pay

## 2015-10-10 VITALS — BP 138/85 | HR 101 | Temp 98.7°F | Resp 18 | Wt 155.4 lb

## 2015-10-10 DIAGNOSIS — K769 Liver disease, unspecified: Secondary | ICD-10-CM | POA: Insufficient documentation

## 2015-10-10 DIAGNOSIS — C155 Malignant neoplasm of lower third of esophagus: Secondary | ICD-10-CM | POA: Insufficient documentation

## 2015-10-10 DIAGNOSIS — G893 Neoplasm related pain (acute) (chronic): Secondary | ICD-10-CM | POA: Insufficient documentation

## 2015-10-10 DIAGNOSIS — J90 Pleural effusion, not elsewhere classified: Secondary | ICD-10-CM | POA: Insufficient documentation

## 2015-10-10 DIAGNOSIS — R590 Localized enlarged lymph nodes: Secondary | ICD-10-CM | POA: Diagnosis not present

## 2015-10-10 DIAGNOSIS — C160 Malignant neoplasm of cardia: Secondary | ICD-10-CM

## 2015-10-10 DIAGNOSIS — R938 Abnormal findings on diagnostic imaging of other specified body structures: Secondary | ICD-10-CM

## 2015-10-10 DIAGNOSIS — E279 Disorder of adrenal gland, unspecified: Secondary | ICD-10-CM | POA: Diagnosis not present

## 2015-10-10 DIAGNOSIS — I3139 Other pericardial effusion (noninflammatory): Secondary | ICD-10-CM

## 2015-10-10 DIAGNOSIS — R188 Other ascites: Secondary | ICD-10-CM | POA: Diagnosis not present

## 2015-10-10 DIAGNOSIS — R918 Other nonspecific abnormal finding of lung field: Secondary | ICD-10-CM | POA: Diagnosis not present

## 2015-10-10 DIAGNOSIS — I319 Disease of pericardium, unspecified: Secondary | ICD-10-CM | POA: Diagnosis present

## 2015-10-10 DIAGNOSIS — I313 Pericardial effusion (noninflammatory): Secondary | ICD-10-CM

## 2015-10-10 MED ORDER — MORPHINE SULFATE ER 15 MG PO TBCR: 15.0000 mg | EXTENDED_RELEASE_TABLET | Freq: Two times a day (BID) | ORAL | Status: DC

## 2015-10-10 MED ORDER — IOHEXOL 300 MG/ML  SOLN
100.0000 mL | Freq: Once | INTRAMUSCULAR | Status: AC | PRN
  Administered 2015-10-10: 100 mL via INTRAVENOUS

## 2015-10-10 NOTE — Progress Notes (Signed)
Patient ID: Joel Miller, male   DOB: 28-Feb-1956, 60 y.o.   MRN: [id number]     Cardiology Office Note   Date:  10/11/2015   ID:  Joel Miller, DOB 1956/03/10, MRN [id number]  PCP:  [name], MD  Cardiologist:  [facility] follow up    History of Present Illness: Joel Miller is a 60 y.o. 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   He was admitted on 07/01/2015 for management of newly diagnosed pulmonary embolism and worsening dysphagia. He had a several month history of ongoing treatment for 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 Patient had a Pleurx catheter placed several weeks prior with an average of 400 mL out per 24 hours. Over several days prior to presentation he noted marked diminished output from drain of 20 mL. Patient was also reporting worsening cough. Because of the symptoms he was evaluated by Dr. Benay Spice at the cancer center. He also reported issues with dysphagia. With further questioning patient reported lower extremity edema which was worsening as well as increasing shortness of breath especially dyspnea on exertion. CT of the chest performed on 10/24 revealed focal pulmonary emboli in the proximal lingular pulmonary artery branches without larger pulmonary emboli identified and no evidence of right heart strain. Also noted bilateral pleural effusions left greater than right with pleural drainage catheter in place on the right. Also incidental finding of a sizable pericardial effusion. Patient  was sent to [facility] for admission and was admitted by [facility] hospitalist on same date. Beause of the dysphagia patient also underwent an esophagram on 10/24 which revealed a stricture at the pylorus. Gastroenterology was consulted. Because of the pericardial effusion cardiothoracic surgery was also consulted. A 2-D echocardiogram completed on 10/25 will a large, free flowing pericardial effusion circumferential to the heart without tamponade physiology. Patient was subsequently transferred to [facility] at the request of thoracic surgery.  On 10/26 patient underwent a subxiphoid pericardial window with drainage of pericardial effusion as well as placement of the left Pleurx catheter for drainage of left pleural effusion under ultrasound fluoroscopic guidance. Patient required ICU evaluation anemia postop. But with subsequent only transferred out to the stepdown unit. In the postoperative period patient did have paroxysmal atrial fibrillation and cardiology was consulted and patient was started on amiodarone after undergoing cardioversion on 10/27. On 10/27 bilateral lower extremity duplex was completed and showed no evidence of DVT. He underwent an EGD on 10/28 which revealed normal postop anatomy with a few proximal gastric erosions but no significant stenoses, strictures or signs of malignancy. Subsequently he has tolerated a full liquid diet with nutrition following regarding appropriate protein intake. He was discharged on 07/06/15.  Today he presents for post hospital follow up. He is feeling well. He has no chest pain,  palpitations, orthopnea or PND. No dizziness, lightheadedness or syncope. No blood in his stool or urine. He is gaining more and more strength day by day. His shortness of breath is improving. He has some LE edema since the hospital for which he elevates  his legs. Doppler in hospital negative for DVT    Past Medical History  Diagnosis Date  . Hyperlipidemia   . Obesity     lost  80 lbs  . Hypogonadism male   . Vitamin D deficiency   . Allergy     HEY FEVER  . GERD (gastroesophageal reflux disease)     pt feels it was related to cancer  . S/P radiation therapy 06/11/2014-07/18/2014  . Hypertension     no med in 64months due to weight loss  . Pleural effusion   . Esophageal cancer ([place]) 05/18/14    Distal  . Diabetes mellitus without complication ([place])     type II no med 60 days     Past Surgical History  Procedure Laterality Date  . Colonoscopy    . 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 Complete esophagectomy N/A 09/05/2014    Procedure: TRANSHIATAL TOTAL ESOPHAGECTOMY; Cervical esophagogastrostomy;  Surgeon: [name], MD;  Location: [place];  Service: Thoracic;  Laterality: N/A;  . Video bronchoscopy N/A 09/05/2014    Procedure: VIDEO BRONCHOSCOPY;  Surgeon: [name], MD;  Location: [place];  Service: Thoracic;  Laterality: N/A;  . Jejunostomy N/A 09/05/2014    Procedure: FEEDING JEJUNOSTOMY;  Surgeon: [name], MD;  Location: [place];  Service: Thoracic;  Laterality: N/A;  . Pyloroplasty N/A 09/05/2014    Procedure: PYLOROPLASTY;  Surgeon: [name], MD;  Location: [place];  Service: Thoracic;  Laterality: N/A;  . Wisdom tooth extraction      2010  . Chest tube insertion Right 05/24/2015    Procedure: INSERTION PLEURAL DRAINAGE CATHETER;  Surgeon: [name], MD;  Location: [place];  Service: Thoracic;  Laterality: Right;  . Subxyphoid pericardial window N/A 07/03/2015    Procedure: SUBXYPHOID PERICARDIAL WINDOW;  Surgeon: [name], MD;  Location: [place];  Service: Thoracic;  Laterality: N/A;  . Chest tube insertion Left 07/03/2015    Procedure: INSERTION PLEURAL DRAINAGE CATHETER;  Surgeon: [name], MD;  Location: [place];  Service: Thoracic;  Laterality: Left;  . Cardioversion N/A 07/04/2015    Procedure: Bedside CARDIOVERSION;  Surgeon: Josue Hector, MD;  Location: [place];  Service: Cardiovascular;  Laterality:  N/A;  . Esophagogastroduodenoscopy N/A 07/05/2015    Procedure: ESOPHAGOGASTRODUODENOSCOPY (EGD);  Surgeon: Gatha Mayer, MD;  Location: [facility] ENDOSCOPY;  Service: Endoscopy;  Laterality: N/A;     Current Outpatient Prescriptions  Medication Sig Dispense Refill  . amiodarone (PACERONE) 200 MG tablet Take 1 tablet (200 mg total) by mouth daily. 30 tablet 11  . feeding supplement (BOOST / RESOURCE BREEZE) LIQD Take 1 Container by mouth 3 (three) times daily between meals.  0  . levothyroxine (SYNTHROID, LEVOTHROID) 50 MCG tablet Take 1 tablet daily on an empty stomach for 30 minutes 90 tablet 1  . LORazepam (ATIVAN) 0.5 MG tablet Take 1 tablet (0.5 mg total) by mouth every 6 (six) hours as needed for anxiety. 60 tablet 1  . morphine (MS CONTIN) 15 MG 12 hr tablet Take 1 tablet (15 mg total) by mouth every 12 (twelve) hours. 60 tablet 0  . oxyCODONE-acetaminophen (PERCOCET/ROXICET) 5-325 MG tablet Take 1 tablet by mouth every 4 (four) hours as needed for moderate pain or severe pain. 100 tablet 0  . prochlorperazine (COMPAZINE) 10 MG tablet Take 1 tablet (10 mg total) by mouth every 6 (six) hours as needed for nausea. 30 tablet 1  . XARELTO 20 MG TABS  tablet TAKE 1 TABLET (20 MG TOTAL) BY MOUTH DAILY WITH SUPPER. 30 tablet 1  . zolpidem (AMBIEN CR) 12.5 MG CR tablet Take 12.5 mg by mouth at bedtime as needed for sleep. Reported on 10/07/2015  4   No current facility-administered medications for this visit.    Allergies:   Ace inhibitors    Social History:  The patient  reports that he has quit smoking. His smoking use included Cigars. He has never used smokeless tobacco. He reports that he drinks about 1.2 oz of alcohol per week. He reports that he does not use illicit drugs.   Family History:  The patient's family history includes Alzheimer's disease in his mother; Cancer (age of onset: 20) in his father; Hypertension in his mother; Prostate cancer in his brother. There is no history of Colon  cancer or Colon polyps.    ROS:  Please see the history of present illness.   Otherwise, review of systems are positive for none.   All other systems are reviewed and negative.    PHYSICAL EXAM: VS:  BP 100/74 mmHg  Pulse 103  Ht 5\' 10"  (1.778 m)  Wt 71.124 kg (156 lb 12.8 oz)  BMI 22.50 kg/m2  SpO2 93% , BMI Body mass index is 22.5 kg/(m^2). GEN: Chronically ill cachectic male  HEENT: normal Neck: no JVD, carotid bruits, bilateral CEA scars  or masses Cardiac: RRR; no murmurs, rubs, or gallops, 2+ pitting edema bilaterally  Respiratory:  Decreased BS right effusion s/p bilateral chest tubes  GI: soft, nontender, nondistended, + BS MS: no deformity or atrophy Skin: warm and dry, no rash Neuro:  Strength and sensation are intact Psych: euthymic mood, full affect   EKG:    07/16/15  NSR HR 96. TWI in almost all leads.   Recent Labs: 05/10/2015: Pro B Natriuretic peptide (BNP) 29.0 09/13/2015: Magnesium 2.0; TSH 7.160* 10/07/2015: ALT <9; BUN 11.1; Creatinine 0.8; HGB 11.5*; Platelets 372; Potassium 5.1; Sodium 136    Lipid Panel    Component Value Date/Time   CHOL 135 09/13/2015 0953   TRIG 80 09/13/2015 0953   HDL 48 09/13/2015 0953   CHOLHDL 2.8 09/13/2015 0953   VLDL 16 09/13/2015 0953   LDLCALC 71 09/13/2015 0953      Wt Readings from Last 3 Encounters:  10/11/15 71.124 kg (156 lb 12.8 oz)  10/10/15 70.489 kg (155 lb 6.4 oz)  10/07/15 71.351 kg (157 lb 4.8 oz)      Other studies Reviewed: Additional studies/ records that were reviewed today include: 2D ECHO Review of the above records demonstrates:  --  Echo on 07/02/2015 showed EF of 50% - [id number], grade 1 diastolic dysfunction, and a large, free-flowing pericardial effusion was identified circumferential to the heart with no evidence of hemodynamic compromise   ASSESSMENT AND PLAN:  HAIDEN GARDE is a 60 y.o. 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   PAF -- S/P DCCV on 10/27/ 16 and is maintaining sinus rhythm  -- He has been on amiodarone 200mg  daily -- CHADVASc =2 (PMH: HTN and DM) -- Continue Xarelto  20 mg   Pericardial effusion s/p  subxiphoid pericardial window 10/26 -- Echo on 07/02/2015 showed EF of 50% - [id number], grade 1 diastolic dysfunction, and a large, free-flowing pericardial effusion was identified circumferential to the heart with no evidence of hemodynamic compromise Reviewed CT from 10/10/15 and no effusion   Bilateral pleural effusions- previous R Pleurx catheter, s/p L Pleurx catheter for drainage of left pleural effusion under ultrasound fluoroscopic guidance on recent admission. Followed by Dr. Servando Snare. CT 2/2 with chronic loculated right effusion   Acute pulmonary embolism- discharged on lovenox to be converted to Xarelto 15mg  po BID  Anemia of chronic disease -- Hemoglobin stable at 10.2  Upon discharge   Carcinoma of the distal third of the esophagus- follows with Dr. Benay Spice   HTN- BP BP 100/74 mmHg  Pulse 103  Ht 5\' 10"  (1.778 m)  Wt 71.124 kg (156 lb 12.8 oz)  BMI 22.50 kg/m2  SpO2 93%  today on no anti hypertensives.   LE edema- this does not bother him. Lower extremity doppler in hospital negative for DVT. Continue elevation.   Current medicines are reviewed at length with the patient today.  The patient does not have concerns regarding medicines.  The following changes have been made:  None   Labs/ tests ordered today include: none   No orders of the defined types were placed in this encounter.     Disposition:   FU with me 3 months   Signed, Jenkins Rouge, MD  10/11/2015 2:26  PM    [address], [place],   [address] Phone: [phone]; Fax: [phone]

## 2015-10-10 NOTE — Progress Notes (Addendum)
Joel Miller   Diagnosis:  Esophagus cancer  INTERVAL HISTORY:   Joel Miller returns as scheduled. He notes improved control of back pain since beginning MS Contin. He is taking less of the Percocet. Appetite continues to be poor. He denies significant shortness of breath. Energy level is poor.  Objective:  Vital signs in last 24 hours:  Blood pressure 138/85, pulse 101, temperature 98.7 F (37.1 C), temperature source Oral, resp. rate 18, weight 155 lb 6.4 oz (70.489 kg), SpO2 100 %.    HEENT: No thrush or ulcers. Resp: Breath sounds diminished at the bases. No respiratory distress. Cardio: Regular rate and rhythm. GI: Abdomen soft and nontender. Vascular: No leg edema.   Lab Results:  Lab Results  Component Value Date   WBC 7.1 10/07/2015   HGB 11.5* 10/07/2015   HCT 35.9* 10/07/2015   MCV 85.1 10/07/2015   PLT 372 10/07/2015   NEUTROABS 6.0 10/07/2015    Imaging:  Ct Chest W Contrast  10/10/2015  CLINICAL DATA:  Subsequent evaluation of a 60 year old male with history of esophageal cancer diagnosed in September 2015 status post resection, chemotherapy and radiation therapy. Chemotherapy last completed in November 2016. Low back pain since 2015. EXAM: CT CHEST, ABDOMEN, AND PELVIS WITH CONTRAST TECHNIQUE: Multidetector CT imaging of the chest, abdomen and pelvis was performed following the standard protocol during bolus administration of intravenous contrast. CONTRAST:  166m OMNIPAQUE IOHEXOL 300 MG/ML  SOLN COMPARISON:  Chest CT 08/29/2015. CT of the chest, abdomen and pelvis 01/31/2015. FINDINGS: CT CHEST FINDINGS Mediastinum/Lymph Nodes: Heart size is normal. There is no significant pericardial fluid, thickening or pericardial calcification. There is atherosclerosis of the thoracic aorta, the great vessels of the mediastinum and the coronary arteries, including calcified atherosclerotic plaque in the left anterior descending coronary  artery. Multiple borderline enlarged and mildly enlarged mediastinal and hilar lymph nodes are again noted, largest of which include a 13 mm short axis AP window lymph node, and a 10 mm short axis right paratracheal lymph node. These are similar to the prior examination. Postoperative changes of esophagectomy and gastric pull-through are again noted. No axillary lymphadenopathy. Lungs/Pleura: Today's study demonstrates an interval increase in the number and size of numerous small pulmonary nodules which appear randomly distributed throughout the lungs bilaterally, highly concerning for progressive metastatic disease to the lungs. The largest of these include a 2.5 x 2.7 cm left lower lobe nodule (image 40 of series 4), an 11 mm left lower lobe nodule (image 35 of series 4), and a 10 mm nodule in the medial aspect of the right upper lobe (image 21 of series 4). Extensive septal thickening is also noted throughout the right lung, and to lesser extent developing in the left lung, slightly increased compared to the prior study, which may reflect some developing lymphangitic spread of disease. Chronic partially loculated moderate-sized bilateral pleural effusions are again noted. In the left hemithorax there is a small amount of pleural thickening. This is much more substantial in the right hemithorax where there is markedly irregular pleural thickening and enhancement, most notably in the inferior aspect of the right hemithorax where the largest pleural-based nodular area of enhancement measures approximately 2.8 cm (image 50 of series 2). Multiple locules of gas are again noted within the inferior aspect of the loculated right pleural fluid collection, again concerning for potential chronic empyema. Musculoskeletal/Soft Tissues: There are no aggressive appearing lytic or blastic lesions noted in the visualized portions of the skeleton.  CT ABDOMEN AND PELVIS FINDINGS Hepatobiliary: Heterogeneous perfusion is again noted  throughout the hepatic parenchyma on portal venous phase images, which normalizes on the delayed phase imaging, presumably related to multiple perfusion anomalies. No definite discrete suspicious cystic or solid hepatic lesions are confidently identified on today's examination. No intra or extrahepatic biliary ductal dilatation. Gallbladder is normal in appearance. Pancreas: No pancreatic mass. No pancreatic ductal dilatation. No pancreatic or peripancreatic fluid or inflammatory changes. Spleen: Unremarkable. Adrenals/Urinary Tract: 2 new masses are noted in the left adrenal gland measuring 1.5 x 3.6 cm in the inferior aspect of the lateral limb and 3.1 x 2.5 cm in the superior aspect of the medial limb, compatible with new left adrenal metastasis. Right adrenal gland is mildly thickened, but otherwise unremarkable in appearance. Right kidney is normal in appearance. In the interpolar region of the left kidney adjacent to the left renal hilum there is a subtle area of architectural distortion measuring 2.0 x 2.1 x 2.2 cm, which appears to potentially represent a solid enhancing neoplasm (image 64 of series 2). No hydroureteronephrosis. Urinary bladder is normal in appearance. Stomach/Bowel: The majority of the stomach is intrathoracic. No pathologic dilatation of small bowel or colon. Vascular/Lymphatic: Mild atherosclerosis throughout the abdominal and pelvic vasculature, without evidence of aneurysm or dissection. No lymphadenopathy noted in the abdomen or pelvis. Reproductive: Prostate gland and seminal vesicles are unremarkable in appearance. Other: Small right inguinal hernia containing only fat. Diffuse mesenteric edema. Trace volume of ascites. No pneumoperitoneum. Musculoskeletal: There are no aggressive appearing lytic or blastic lesions noted in the visualized portions of the skeleton. IMPRESSION: 1. Today's study demonstrates progression of disease with what appears to be widespread metastatic disease to  the lungs, which likely includes both hematogenous metastasis and developing lymphangitic spread of tumor, probable malignant right pleural involvement, and new left adrenal masses concerning for metastatic disease. Mediastinal lymphadenopathy is essentially unchanged. 2. Persistent locules of gas in the inferior aspect of the chronic right pleural effusion, which remain concerning for potential chronic empyema in the absence of any recent surgery or intervention. 3. Perfusion anomalies throughout the liver redemonstrated. No definite hepatic lesions are confidently identified at this time. 4. Trace volume of ascites and diffuse mesenteric edema. 5. Probable enhancing neoplasm in the interpolar region of the left kidney measuring 2.0 x 2.1 x 2.2 cm. Attention on routine followup examinations is recommended. Alternatively, this could be definitively characterized with MRI of the abdomen with and without IV gadolinium if clinically appropriate. 6. Additional incidental findings, as above. Electronically Signed   By: Vinnie Langton M.D.   On: 10/10/2015 08:33   Ct Abdomen Pelvis W Contrast  10/10/2015  CLINICAL DATA:  Subsequent evaluation of a 60 year old male with history of esophageal cancer diagnosed in September 2015 status post resection, chemotherapy and radiation therapy. Chemotherapy last completed in November 2016. Low back pain since 2015. EXAM: CT CHEST, ABDOMEN, AND PELVIS WITH CONTRAST TECHNIQUE: Multidetector CT imaging of the chest, abdomen and pelvis was performed following the standard protocol during bolus administration of intravenous contrast. CONTRAST:  11m OMNIPAQUE IOHEXOL 300 MG/ML  SOLN COMPARISON:  Chest CT 08/29/2015. CT of the chest, abdomen and pelvis 01/31/2015. FINDINGS: CT CHEST FINDINGS Mediastinum/Lymph Nodes: Heart size is normal. There is no significant pericardial fluid, thickening or pericardial calcification. There is atherosclerosis of the thoracic aorta, the great vessels  of the mediastinum and the coronary arteries, including calcified atherosclerotic plaque in the left anterior descending coronary artery. Multiple borderline enlarged and mildly  enlarged mediastinal and hilar lymph nodes are again noted, largest of which include a 13 mm short axis AP window lymph node, and a 10 mm short axis right paratracheal lymph node. These are similar to the prior examination. Postoperative changes of esophagectomy and gastric pull-through are again noted. No axillary lymphadenopathy. Lungs/Pleura: Today's study demonstrates an interval increase in the number and size of numerous small pulmonary nodules which appear randomly distributed throughout the lungs bilaterally, highly concerning for progressive metastatic disease to the lungs. The largest of these include a 2.5 x 2.7 cm left lower lobe nodule (image 40 of series 4), an 11 mm left lower lobe nodule (image 35 of series 4), and a 10 mm nodule in the medial aspect of the right upper lobe (image 21 of series 4). Extensive septal thickening is also noted throughout the right lung, and to lesser extent developing in the left lung, slightly increased compared to the prior study, which may reflect some developing lymphangitic spread of disease. Chronic partially loculated moderate-sized bilateral pleural effusions are again noted. In the left hemithorax there is a small amount of pleural thickening. This is much more substantial in the right hemithorax where there is markedly irregular pleural thickening and enhancement, most notably in the inferior aspect of the right hemithorax where the largest pleural-based nodular area of enhancement measures approximately 2.8 cm (image 50 of series 2). Multiple locules of gas are again noted within the inferior aspect of the loculated right pleural fluid collection, again concerning for potential chronic empyema. Musculoskeletal/Soft Tissues: There are no aggressive appearing lytic or blastic lesions  noted in the visualized portions of the skeleton. CT ABDOMEN AND PELVIS FINDINGS Hepatobiliary: Heterogeneous perfusion is again noted throughout the hepatic parenchyma on portal venous phase images, which normalizes on the delayed phase imaging, presumably related to multiple perfusion anomalies. No definite discrete suspicious cystic or solid hepatic lesions are confidently identified on today's examination. No intra or extrahepatic biliary ductal dilatation. Gallbladder is normal in appearance. Pancreas: No pancreatic mass. No pancreatic ductal dilatation. No pancreatic or peripancreatic fluid or inflammatory changes. Spleen: Unremarkable. Adrenals/Urinary Tract: 2 new masses are noted in the left adrenal gland measuring 1.5 x 3.6 cm in the inferior aspect of the lateral limb and 3.1 x 2.5 cm in the superior aspect of the medial limb, compatible with new left adrenal metastasis. Right adrenal gland is mildly thickened, but otherwise unremarkable in appearance. Right kidney is normal in appearance. In the interpolar region of the left kidney adjacent to the left renal hilum there is a subtle area of architectural distortion measuring 2.0 x 2.1 x 2.2 cm, which appears to potentially represent a solid enhancing neoplasm (image 64 of series 2). No hydroureteronephrosis. Urinary bladder is normal in appearance. Stomach/Bowel: The majority of the stomach is intrathoracic. No pathologic dilatation of small bowel or colon. Vascular/Lymphatic: Mild atherosclerosis throughout the abdominal and pelvic vasculature, without evidence of aneurysm or dissection. No lymphadenopathy noted in the abdomen or pelvis. Reproductive: Prostate gland and seminal vesicles are unremarkable in appearance. Other: Small right inguinal hernia containing only fat. Diffuse mesenteric edema. Trace volume of ascites. No pneumoperitoneum. Musculoskeletal: There are no aggressive appearing lytic or blastic lesions noted in the visualized portions of  the skeleton. IMPRESSION: 1. Today's study demonstrates progression of disease with what appears to be widespread metastatic disease to the lungs, which likely includes both hematogenous metastasis and developing lymphangitic spread of tumor, probable malignant right pleural involvement, and new left adrenal masses concerning for  metastatic disease. Mediastinal lymphadenopathy is essentially unchanged. 2. Persistent locules of gas in the inferior aspect of the chronic right pleural effusion, which remain concerning for potential chronic empyema in the absence of any recent surgery or intervention. 3. Perfusion anomalies throughout the liver redemonstrated. No definite hepatic lesions are confidently identified at this time. 4. Trace volume of ascites and diffuse mesenteric edema. 5. Probable enhancing neoplasm in the interpolar region of the left kidney measuring 2.0 x 2.1 x 2.2 cm. Attention on routine followup examinations is recommended. Alternatively, this could be definitively characterized with MRI of the abdomen with and without IV gadolinium if clinically appropriate. 6. Additional incidental findings, as above. Electronically Signed   By: Vinnie Langton M.D.   On: 10/10/2015 08:33    Medications: I have reviewed the patient's current medications.  Assessment/Plan: 1. Adenocarcinoma the esophagus/GE junction-status post an endoscopy 05/18/2014 confirming a gastric cardia/lower esophageal mass with tumor extending proximally in the esophagus to 25 cm from the incisors  HER-2/neu amplified   Staging CT scan 05/22/2014 with indeterminate bilateral pulmonary nodules; and prominent paraesophageal, porta hepatis, and para-aortic lymph nodes   Staging PET scan 05/29/2014 confirmed hypermetabolic soft tissue thickening at the distal third of the esophagus extending into the proximal stomach. 2 additional smaller areas of hypermetabolism or noted in the more proximal esophagus with a mildly enlarged  hypermetabolic gastrohepatic node.   Initiation of radiation and concurrent Taxol/carboplatin 06/11/2014; Taxol/carboplatin completed 07/09/2014; radiation completed 07/18/2014.  Esophagectomy/jejunostomy feeding tube placement 09/05/2014 with the pathology revealing [id number] tumor, HER-2/neu amplified, positive distal resection margin   Cycle 1 FOLFOX 11/01/2014  Cycle 2 FOLFOX 11/15/2014  Cycle 3 FOLFOX 11/29/2014  Cycle 4 FOLFOX 12/13/2014  Cycle 5 FOLFOX 12/27/2014  Cycle 6 FOLFOX 01/17/2015  CTs of the chest, abdomen, and pelvis on 02/01/2015-negative for recurrent esophagus cancer  CT of the chest 05/14/2015 revealed a large right pleural effusion,small left effusion, left lung nodules, mediastinal lymphadenopathy, and a left adrenal metastasis  Placement of a right Pleurx catheter 05/24/2015  Echocardiogram 05/31/2015-LVEF 60-65%  Cycle 1 Taxol/Herceptin 06/04/2015  Cycle 2 Taxol/Herceptin 06/10/2015  Cycle 3 Taxol/Herceptin 06/17/2015  Cycle 4 Taxol/Herceptin 06/24/2015  Cycle 5 Taxol/Herceptin 07/16/2015  Cycle 6 Taxol/Herceptin 07/22/2015  Cycle 7 Taxol/Herceptin 07/29/2015  Chest CT 08/29/2015 without evidence of disease progression  CTs of the chest, abdomen and pelvis 10/10/2015 with interval increase in number and size of numerous bilateral small pulmonary nodules; multiple borderline enlarged and mildly enlarged mediastinal and hilar lymph nodes again noted similar to the prior examination; possible developing lymphangitic spread of tumor; probable malignant right pleural involvement and new left adrenal masses. Probable enhancing neoplasm in the interpolar region of the left kidney.  2. History of anorexia/weight loss 3. Diabetes  4. Hypertension  5. Hyperlipidemia  6. Back pain-potentially related to chronic spine disease versus metastases. Improved pain control with MS Contin. 7. Solid/liquid dysphasia-esophagram 07/01/2015 confirmed a  stricture near the pylorus, upper endoscopy 07/05/2015 with no evidence of a stricture and no malignancy seen 8. Pulmonary embolism confirmed on a CT 07/01/2015-Lovenox initiated, to begin Xarelto 07/10/2015 9. Pericardial effusion and progressive left pleural effusion on the CT 07/01/2015-status post a pericardial window and placement of a left Pleurx 07/03/2015, negative pericardium biopsy and negative pericardial/left pleural fluid cytology 10. Right wristdrop-most likely secondary to peripheral nerve compression from sleeping in a recliner, now wearing a brace and followed in occupational therapy; improved  11. Sacral decubitus ulcers-healed     Disposition: Joel Miller appears to  have progressive metastatic disease involving the lungs and left adrenal gland based on the CT scan from earlier today. We discussed proceeding with a biopsy to confirm metastatic disease. He is not interested in pursuing this at present.  There is no clear explanation for the back pain on the CT scans. It is possible the left adrenal gland mass(es) are causing the pain. The pain may be due to unrecognized cancer. He notes improved pain control since beginning MS Contin.  Dr. Benay Spice reviewed the CT scan results and images on the computer with Joel Miller at today's visit and discussed systemic therapy versus supportive care. He is interested in systemic therapy. We discussed salvage therapy that could be given in our office versus a referral for a clinical trial. He is interested in the clinical trial option and would like to be seen at [facility]. We made a referral to Dr. Altamease Oiler.  He will return for a follow-up visit here in approximately 2 weeks for additional discussion. He will contact the office in the interim with any problems.  Patient seen with Dr. Benay Spice. 35 minutes were spent face-to-face at today's visit with the majority of that time involved in counseling/coordination of care.    [name] ANP/GNP-BC     10/10/2015  2:48 PM  This was a shared visit with [name]. We reviewed the CT images with Joel Miller. His insurance company declined approval for a PET scan. There is no CT explanation for his back pain unless the pain is related to the adrenal metastasis.  I discussed treatment options with Joel Miller. He does not have biopsy-proven metastatic disease, but he understands the very high likelihood the CT changes and clinical presentation last fall are secondary to progressive metastatic esophagus cancer. We discussed comfort care versus salvage systemic therapy. He request a second opinion at [facility]. We referred him to Dr. Altamease Oiler.  He will return for an office visit in 2 weeks.  Julieanne Manson, M.D.

## 2015-10-11 ENCOUNTER — Ambulatory Visit (INDEPENDENT_AMBULATORY_CARE_PROVIDER_SITE_OTHER): Payer: BLUE CROSS/BLUE SHIELD | Admitting: Cardiovascular Disease

## 2015-10-11 ENCOUNTER — Telehealth: Payer: Self-pay

## 2015-10-11 ENCOUNTER — Telehealth: Payer: Self-pay | Admitting: Nurse Practitioner

## 2015-10-11 ENCOUNTER — Encounter: Payer: Self-pay | Admitting: Cardiovascular Disease

## 2015-10-11 VITALS — BP 100/74 | HR 103 | Ht 70.0 in | Wt 156.8 lb

## 2015-10-11 DIAGNOSIS — I480 Paroxysmal atrial fibrillation: Secondary | ICD-10-CM

## 2015-10-11 NOTE — Telephone Encounter (Signed)
As per Judith Part in radiology called to get copies of last 2 CT scans on a disc. Pt's phone number given to [facility] to call when disc is ready.

## 2015-10-11 NOTE — Patient Instructions (Signed)
Medication Instructions:  Your physician recommends that you continue on your current medications as directed. Please refer to the Current Medication list given to you today.  Labwork: NONE  Testing/Procedures: NONE  Follow-Up: Your physician recommends that you schedule a follow-up appointment in: 3 months with Dr. Johnsie Cancel.   Any Other Special Instructions Will Be Listed Below (If Applicable).     If you need a refill on your cardiac medications before your next appointment, please call your pharmacy.

## 2015-10-11 NOTE — Telephone Encounter (Signed)
Lft msg for pt confirming cancellations and confirming MD visit per 02/02 POF, mailed out schedule... KJ

## 2015-10-15 ENCOUNTER — Ambulatory Visit (HOSPITAL_BASED_OUTPATIENT_CLINIC_OR_DEPARTMENT_OTHER): Payer: BLUE CROSS/BLUE SHIELD | Admitting: Nurse Practitioner

## 2015-10-15 ENCOUNTER — Encounter: Payer: Self-pay | Admitting: Nurse Practitioner

## 2015-10-15 ENCOUNTER — Telehealth: Payer: Self-pay | Admitting: *Deleted

## 2015-10-15 ENCOUNTER — Ambulatory Visit: Payer: BLUE CROSS/BLUE SHIELD | Admitting: Occupational Therapy

## 2015-10-15 ENCOUNTER — Ambulatory Visit (HOSPITAL_COMMUNITY)
Admission: RE | Admit: 2015-10-15 | Discharge: 2015-10-15 | Disposition: A | Discharge status: Home / Self Care | Payer: BLUE CROSS/BLUE SHIELD | Source: Ambulatory Visit | Attending: Nurse Practitioner | Admitting: Nurse Practitioner

## 2015-10-15 VITALS — BP 119/85 | HR 104 | Temp 97.9°F | Resp 18 | Ht 70.0 in | Wt 150.7 lb

## 2015-10-15 DIAGNOSIS — R0600 Dyspnea, unspecified: Secondary | ICD-10-CM | POA: Diagnosis not present

## 2015-10-15 DIAGNOSIS — R05 Cough: Secondary | ICD-10-CM

## 2015-10-15 DIAGNOSIS — C155 Malignant neoplasm of lower third of esophagus: Secondary | ICD-10-CM

## 2015-10-15 DIAGNOSIS — G893 Neoplasm related pain (acute) (chronic): Secondary | ICD-10-CM

## 2015-10-15 DIAGNOSIS — R110 Nausea: Secondary | ICD-10-CM | POA: Diagnosis not present

## 2015-10-15 DIAGNOSIS — R0602 Shortness of breath: Secondary | ICD-10-CM

## 2015-10-15 DIAGNOSIS — C159 Malignant neoplasm of esophagus, unspecified: Secondary | ICD-10-CM | POA: Diagnosis not present

## 2015-10-15 DIAGNOSIS — J9601 Acute respiratory failure with hypoxia: Secondary | ICD-10-CM

## 2015-10-15 DIAGNOSIS — C7801 Secondary malignant neoplasm of right lung: Secondary | ICD-10-CM

## 2015-10-15 DIAGNOSIS — C7802 Secondary malignant neoplasm of left lung: Secondary | ICD-10-CM

## 2015-10-15 DIAGNOSIS — C7970 Secondary malignant neoplasm of unspecified adrenal gland: Secondary | ICD-10-CM

## 2015-10-15 DIAGNOSIS — J90 Pleural effusion, not elsewhere classified: Secondary | ICD-10-CM | POA: Insufficient documentation

## 2015-10-15 DIAGNOSIS — R062 Wheezing: Secondary | ICD-10-CM

## 2015-10-15 DIAGNOSIS — Z7901 Long term (current) use of anticoagulants: Secondary | ICD-10-CM

## 2015-10-15 MED ORDER — MORPHINE SULFATE ER 30 MG PO TBCR: 30.0000 mg | EXTENDED_RELEASE_TABLET | Freq: Two times a day (BID) | ORAL | Status: AC

## 2015-10-15 MED ORDER — ALBUTEROL SULFATE HFA 108 (90 BASE) MCG/ACT IN AERS: 1.0000 | INHALATION_SPRAY | Freq: Four times a day (QID) | RESPIRATORY_TRACT | Status: AC | PRN

## 2015-10-15 NOTE — Progress Notes (Signed)
SYMPTOM MANAGEMENT CLINIC   HPI: Joel Miller 60 y.o. male diagnosed with esophageal cancer; with both lung and adrenal metastasis.  Patient is status post Taxol/Herceptin chemotherapy regimen; and is currently undergoing observation only.   Patient last received Taxol/Herceptin on 07/28/2016.  Most recent restaging scans noted.  Both lungs and adrenal metastasis.  Patient presented to the [place] today with complaint of increased dyspnea, coughing spasms, and occasional wheezing.  He denies any recent fevers or chills.  Scans were reviewed in detail with the patient last week; and there was discussion regarding both consideration of hospice versus a second opinion.  Patient has made the decision to obtain a second opinion at [facility].  He patient states that he has an appointment at [facility] for this coming Thursday, 10/17/2015, at 2 PM.  Patient is scheduled to return to the [facility] on 10/25/2015 for follow-up visit with Dr. Benay Spice.  He states that he will then have more information from the [facility]; and make a decision regarding further care.  HPI  Review of Systems  Constitutional: Positive for malaise/fatigue.  Respiratory: Positive for cough, shortness of breath and wheezing. Negative for hemoptysis and sputum production.   All other systems reviewed and are negative.   Past Medical History  Diagnosis Date  . Hyperlipidemia   . Obesity     lost 80 lbs  . Hypogonadism male   . Vitamin D deficiency   . Allergy     HEY FEVER  . GERD (gastroesophageal reflux disease)     pt feels it was related to cancer  . S/P radiation therapy 06/11/2014-07/18/2014  . Hypertension     no med in 82month due to weight loss  . Pleural effusion   . [place]18[place]    [place]  . Diabetes mellitus without complication ([place]     type II no med 60 days     Past Surgical History  Procedure Laterality Date  . Colonoscopy    . Biospy of esphagus   05/18/14  . Complete esophagectomy N/A 09/05/2014    Procedure: TRANSHIATAL TOTAL ESOPHAGECTOMY; Cervical esophagogastrostomy;  Surgeon: EGrace Isaac MD;  [place]  Service: Thoracic;  Laterality: N/A;  . Video bronchoscopy N/A 09/05/2014    Procedure: VIDEO BRONCHOSCOPY;  Surgeon: EGrace Isaac MD;  [place]  Service: Thoracic;  Laterality: N/A;  . Jejunostomy N/A 09/05/2014    Procedure: FEEDING JEJUNOSTOMY;  Surgeon: EGrace Isaac MD;  [place]  Service: Thoracic;  Laterality: N/A;  . Pyloroplasty N/A 09/05/2014    Procedure: PYLOROPLASTY;  Surgeon: EGrace Isaac MD;  [place]  Service: Thoracic;  Laterality: N/A;  . Wisdom tooth extraction      2010  . Chest tube insertion Right 05/24/2015    Procedure: INSERTION PLEURAL DRAINAGE CATHETER;  Surgeon: EGrace Isaac MD;  [place]  Service: Thoracic;  Laterality: Right;  . Subxyphoid pericardial window N/A 07/03/2015    Procedure: SUBXYPHOID PERICARDIAL WINDOW;  Surgeon: EGrace Isaac MD;  [place]  Service: Thoracic;  Laterality: N/A;  . Chest tube insertion Left 07/03/2015    Procedure: INSERTION PLEURAL DRAINAGE CATHETER;  Surgeon: EGrace Isaac MD;  [place]  Service: Thoracic;  Laterality: Left;  . Cardioversion N/A 07/04/2015    Procedure: Bedside CARDIOVERSION;  Surgeon: PJosue Hector MD;  Location[place]  Service: Cardiovascular;  Laterality: N/A;  . Esophagogastroduodenoscopy N/A 07/05/2015  Procedure: ESOPHAGOGASTRODUODENOSCOPY (EGD);  Surgeon: Gatha Mayer, MD;  Location: [facility] ENDOSCOPY;  Service: Endoscopy;  Laterality: N/A;    has Diabetes mellitus without complication ([place]); Testosterone Deficiency; GERD; Carcinoma of distal third of esophagus ([place]); Esophageal cancer ([place]); DVT (deep venous thrombosis) ([place]); Acute pulmonary embolism ([place]); Pericardial effusion; Anemia of chronic disease; Bilateral pleural effusion; Protein-calorie  malnutrition, severe (HCC); PAF (paroxysmal atrial fibrillation) ([place]); HTN (hypertension); Vitamin D deficiency; Medication management; Mixed hyperlipidemia; Long term (current) use of anticoagulants; Cancer associated pain; Nausea without vomiting; and Dyspnea on his problem list.    is allergic to ace inhibitors.    Medication List       This list is accurate as of: 10/15/15  1:15 PM.  Always use your most recent med list.               albuterol 108 (90 Base) MCG/ACT inhaler  Commonly known as:  PROVENTIL HFA;VENTOLIN HFA  Inhale 1-2 puffs into the lungs every 6 (six) hours as needed for wheezing or shortness of breath.     amiodarone 200 MG tablet  Commonly known as:  PACERONE  Take 1 tablet (200 mg total) by mouth daily.     feeding supplement Liqd  Take 1 Container by mouth 3 (three) times daily between meals.     levothyroxine 50 MCG tablet  Commonly known as:  SYNTHROID, LEVOTHROID  Take 1 tablet daily on an empty stomach for 30 minutes     LORazepam 0.5 MG tablet  Commonly known as:  ATIVAN  Take 1 tablet (0.5 mg total) by mouth every 6 (six) hours as needed for anxiety.     morphine 30 MG 12 hr tablet  Commonly known as:  MS CONTIN  Take 1 tablet (30 mg total) by mouth every 12 (twelve) hours.     oxyCODONE-acetaminophen 5-325 MG tablet  Commonly known as:  PERCOCET/ROXICET  Take 1 tablet by mouth every 4 (four) hours as needed for moderate pain or severe pain.     prochlorperazine 10 MG tablet  Commonly known as:  COMPAZINE  Take 1 tablet (10 mg total) by mouth every 6 (six) hours as needed for nausea.     XARELTO 20 MG Tabs tablet  Generic drug:  rivaroxaban  TAKE 1 TABLET (20 MG TOTAL) BY MOUTH DAILY WITH SUPPER.     zolpidem 12.5 MG CR tablet  Commonly known as:  AMBIEN CR  Take 12.5 mg by mouth at bedtime as needed for sleep. Reported on 10/07/2015         PHYSICAL EXAMINATION  Oncology Vitals 10/15/2015 10/11/2015  Height 178 cm 178 cm  Weight  68.357 kg 71.124 kg  Weight (lbs) 150 lbs 11 oz 156 lbs 13 oz  BMI (kg/m2) 21.62 kg/m2 22.5 kg/m2  Temp 97.9 -  Pulse 104 103  Resp 18 -  SpO2 99 93  BSA (m2) 1.84 m2 1.87 m2   BP Readings from Last 2 Encounters:  10/15/15 119/85  10/11/15 100/74    Physical Exam  Constitutional: He is oriented to person, place, and time. He appears malnourished. He appears unhealthy. He appears cachectic.  Patient appears more fatigued and weak than on previous exams.  HENT:  Head: Normocephalic and atraumatic.  Mouth/Throat: Oropharynx is clear and moist.  Eyes: Conjunctivae and EOM are normal. Pupils are equal, round, and reactive to light. Right eye exhibits no discharge. Left eye exhibits no discharge. No scleral icterus.  Neck: Normal range of motion. Neck supple. No  JVD present. No tracheal deviation present. No thyromegaly present.  Cardiovascular: Normal rate, regular rhythm, normal heart sounds and intact distal pulses.   Pulmonary/Chest: No respiratory distress. He has no wheezes. He has rales. He exhibits no tenderness.  Mild shortness of breath even during conversation.  O2 sat decreased down to 86% while patient walking in the cancer Center.  Patient with frequent bouts of coughing.  Abdominal: Soft. Bowel sounds are normal. He exhibits no distension and no mass. There is no tenderness. There is no rebound and no guarding.  Musculoskeletal: Normal range of motion. He exhibits no edema or tenderness.  Lymphadenopathy:    He has no cervical adenopathy.  Neurological: He is alert and oriented to person, place, and time. Gait normal.  Skin: Skin is warm and dry. No rash noted. No erythema. There is pallor.  Psychiatric: Affect normal.    LABORATORY DATA:. No visits with results within 3 Day(s) from this visit. Latest known visit with results is:  Appointment on 10/07/2015  Component Date Value Ref Range Status  . WBC 10/07/2015 7.1  4.0 - 10.3 10e3/uL Final  . NEUT# 10/07/2015 6.0   1.5 - 6.5 10e3/uL Final  . HGB 10/07/2015 11.5* 13.0 - 17.1 g/dL Final  . HCT 10/07/2015 35.9* 38.4 - 49.9 % Final  . Platelets 10/07/2015 372  140 - 400 10e3/uL Final  . MCV 10/07/2015 85.1  79.3 - 98.0 fL Final  . MCH 10/07/2015 27.2  27.2 - 33.4 pg Final  . MCHC 10/07/2015 31.9* 32.0 - 36.0 g/dL Final  . RBC 10/07/2015 4.22  4.20 - 5.82 10e6/uL Final  . RDW 10/07/2015 16.5* 11.0 - 14.6 % Final  . lymph# 10/07/2015 0.4* 0.9 - 3.3 10e3/uL Final  . MONO# 10/07/2015 0.6  0.1 - 0.9 10e3/uL Final  . Eosinophils Absolute 10/07/2015 0.0  0.0 - 0.5 10e3/uL Final  . Basophils Absolute 10/07/2015 0.0  0.0 - 0.1 10e3/uL Final  . NEUT% 10/07/2015 85.1* 39.0 - 75.0 % Final  . LYMPH% 10/07/2015 5.0* 14.0 - 49.0 % Final  . MONO% 10/07/2015 8.8  0.0 - 14.0 % Final  . EOS% 10/07/2015 0.7  0.0 - 7.0 % Final  . BASO% 10/07/2015 0.4  0.0 - 2.0 % Final  . Sodium 10/07/2015 136  136 - 145 mEq/L Final  . Potassium 10/07/2015 5.1  3.5 - 5.1 mEq/L Final  . Chloride 10/07/2015 99  98 - 109 mEq/L Final  . CO2 10/07/2015 26  22 - 29 mEq/L Final  . Glucose 10/07/2015 105  70 - 140 mg/dl Final   Glucose reference range is for nonfasting patients. Fasting glucose reference range is 70- 100.  Marland Kitchen BUN 10/07/2015 11.1  7.0 - 26.0 mg/dL Final  . Creatinine 10/07/2015 0.8  0.7 - 1.3 mg/dL Final  . Total Bilirubin 10/07/2015 <0.30  0.20 - 1.20 mg/dL Final  . Alkaline Phosphatase 10/07/2015 108  40 - 150 U/L Final  . AST 10/07/2015 13  5 - 34 U/L Final  . ALT 10/07/2015 <9  0 - 55 U/L Final  . Total Protein 10/07/2015 7.1  6.4 - 8.3 g/dL Final  . Albumin 10/07/2015 3.0* 3.5 - 5.0 g/dL Final  . Calcium 10/07/2015 9.8  8.4 - 10.4 mg/dL Final  . Anion Gap 10/07/2015 10  3 - 11 mEq/L Final  . EGFR 10/07/2015 >90  >90 ml/min/1.73 m2 Final   eGFR is calculated using the CKD-EPI Creatinine Equation (2009)     RADIOGRAPHIC STUDIES: Dg Chest 2 View  10/15/2015  CLINICAL DATA:  60 year old male with chronic shortness of  breath increasing x6 months. Wheezing and cough for 2 days. Metastatic esophageal cancer. Subsequent encounter. EXAM: CHEST  2 VIEW COMPARISON:  Restaging CT chest abdomen and pelvis 10/10/2015 and earlier. FINDINGS: Partially loculated bilateral pleural effusions have not significantly changed since the recent CT. Increased interstitial and nodular pulmonary opacity has progressed radiographically since 10/03/2015. No pneumothorax. Stable cardiac size and mediastinal contours. Visualized tracheal air column is within normal limits. Stable visualized osseous structures. Retained oral contrast in the transverse colon. IMPRESSION: Stable chest with partially loculated pleural effusions and metastatic disease compared to the recent chest CT (please see that report). No new cardiopulmonary abnormality. Electronically Signed   By: Genevie Ann M.D.   On: 10/15/2015 10:59    ASSESSMENT/PLAN:    Nausea without vomiting Patient reports recurrent nausea; that is typically associated with the return of patient's pain.  He states that he has been taking Ativan at increasing dose; as well as intermittent Compazine.  Since patient takes amiodarone on a regular basis; Zofran is not an option for him.  Advised patient to continue with the Ativan 0.5 mg.  Instructed patient that he could increase the Ativan to every 4 hours if needed for nausea.  Cautioned patient that the increased Ativan dosage may make him more sleepy; and to avoid falls.  Long term (current) use of anticoagulants Patient has a history of pulmonary embolism in the past; and continues to take Xarelto on a daily basis.    Carcinoma of distal third of esophagus [facility]) Patient last received Taxol/Herceptin on 07/28/2016.  Most recent restaging scans noted.  Both lungs and adrenal metastasis.  Scans were reviewed in detail with the patient last week; and there was discussion regarding both consideration of hospice versus a second opinion.  Patient has  made the decision to obtain a second opinion at [facility].  He patient states that he has an appointment at [facility] for this coming Thursday, 10/17/2015, at 2 PM.  Patient is scheduled to return to the [place] on 10/25/2015 for follow-up visit with Dr. Benay Spice.  He states that he will then have more information from the [facility]; and make a decision regarding further care.  Cancer associated pain Patient continues to complain of chronic back pain and generalized discomfort.  He is wearing a back support brace at this time.  Patient states that the MS Contin 15 mg was controlling his pain; but now his pain has increased.  He states that he has been taking Percocet on an every 3 hour basis to help control his pain.  He is requesting an increase in his pain medication.    Reviewed all with Dr. Benay Spice; and decision was made to try increasing the MS Contin to 30 mg twice daily; and continue to use the Percocet for breakthrough pain.  Advised patient that an increase in pain medication may increase the patient's sleepiness; and to be very cautious to avoid any falls or injury.  Dyspnea Patient's most recent restaging scan confirmed both lung and adrenal metastasis.  Patient reports progressive dyspnea; specifically with exertion.  He also has been experiencing increased coughing spasms with any exertion whatsoever.  He reports some audible wheezing at times as well.  Exam today revealed some coarse rhonchi bilaterally; but no obvious wheeze.  Patient was noted to have a dry cough occasionally.  Patient appears slightly short of breath; but in no acute distress.  O2 sat  at rest today was 99% on room air; but O2 sat decreased down to 86% on room air while walking down the hall in the [place] and coughing.  Of note-patient develops coughing spasms with any exertion.  Chest xray obtained today revealed:   IMPRESSION: Stable chest with partially loculated pleural effusions  and metastatic disease compared to the recent chest CT (please see that report). No new cardiopulmonary abnormality.   Will prescribe a albuterol inhaler to use on an as-needed basis.  Also, will order home oxygen from [facility] as well.  [place] nurse has reported that [facility] is on the way with oxygen for the patient.  Patient was advised to go directly to the emergency department for any worsening respiratory distress whatsoever.     Patient stated understanding of all instructions; and was in agreement with this plan of care. The patient knows to call the clinic with any problems, questions or concerns.   This was a shared visit with Dr. Benay Spice today.  Total time spent with patient was 40 minutes;  with greater than 75 percent of that time spent in face to face counseling regarding patient's symptoms,  and coordination of care and follow up.  Disclaimer:This dictation was prepared with Dragon/digital dictation along with [employer]. Any transcriptional errors that result from this process are unintentional.  Drue Second, NP 10/15/2015    this was a shared visit with Drue Second. Joel Miller was examined. I reviewed the chest x-ray from today. I suspect the dyspnea secondary to progression of tumor in the chest.  We arranged for home oxygen therapy.  he is scheduled for a second opinion at [facility] on 10/17/2015. Joel Miller will return for an office visit here next week.   we recommended he not drive while taking narcotics.   Julieanne Manson, M.D.

## 2015-10-15 NOTE — Telephone Encounter (Signed)
Text message received from patient requesting an [facility] appt today for increased SOB, dry hacking cough and adjusting pain medication and nausea medication. Pt was up all night focusing Per Dr. Benay Spice ok to add to schedule.

## 2015-10-15 NOTE — Assessment & Plan Note (Signed)
Patient continues to complain of chronic back pain and generalized discomfort.  He is wearing a back support brace at this time.  Patient states that the MS Contin 15 mg was controlling his pain; but now his pain has increased.  He states that he has been taking Percocet on an every 3 hour basis to help control his pain.  He is requesting an increase in his pain medication.    Reviewed all with Dr. Benay Spice; and decision was made to try increasing the MS Contin to 30 mg twice daily; and continue to use the Percocet for breakthrough pain.  Advised patient that an increase in pain medication may increase the patient's sleepiness; and to be very cautious to avoid any falls or injury.

## 2015-10-15 NOTE — Telephone Encounter (Signed)
O2 sat at rest today was 99% on room air; but O2 sat decreased down to 86% on room air while walking down the hall in the [place] and coughing. Of note-patient develops coughing spasms with any exertion

## 2015-10-15 NOTE — Assessment & Plan Note (Signed)
Patient has a history of pulmonary embolism in the past; and continues to take Xarelto on a daily basis.

## 2015-10-15 NOTE — Assessment & Plan Note (Signed)
Patient last received Taxol/Herceptin on 07/28/2016.  Most recent restaging scans noted.  Both lungs and adrenal metastasis.  Scans were reviewed in detail with the patient last week; and there was discussion regarding both consideration of hospice versus a second opinion.  Patient has made the decision to obtain a second opinion at [facility].  He patient states that he has an appointment at [facility] for this coming Thursday, 10/17/2015, at 2 PM.  Patient is scheduled to return to the [place] on 10/25/2015 for follow-up visit with Dr. Benay Spice.  He states that he will then have more information from the [facility]; and make a decision regarding further care.

## 2015-10-15 NOTE — Assessment & Plan Note (Signed)
Patient reports recurrent nausea; that is typically associated with the return of patient's pain.  He states that he has been taking Ativan at increasing dose; as well as intermittent Compazine.  Since patient takes amiodarone on a regular basis; Zofran is not an option for him.  Advised patient to continue with the Ativan 0.5 mg.  Instructed patient that he could increase the Ativan to every 4 hours if needed for nausea.  Cautioned patient that the increased Ativan dosage may make him more sleepy; and to avoid falls.

## 2015-10-15 NOTE — Assessment & Plan Note (Addendum)
Patient's most recent restaging scan confirmed both lung and adrenal metastasis.  Patient reports progressive dyspnea; specifically with exertion.  He also has been experiencing increased coughing spasms with any exertion whatsoever.  He reports some audible wheezing at times as well.  Exam today revealed some coarse rhonchi bilaterally; but no obvious wheeze.  Patient was noted to have a dry cough occasionally.  Patient appears slightly short of breath; but in no acute distress.  O2 sat at rest today was 99% on room air; but O2 sat decreased down to 86% on room air while walking down the hall in the [place] and coughing.  Of note-patient develops coughing spasms with any exertion.  Chest xray obtained today revealed:   IMPRESSION: Stable chest with partially loculated pleural effusions and metastatic disease compared to the recent chest CT (please see that report). No new cardiopulmonary abnormality.   Will prescribe a albuterol inhaler to use on an as-needed basis.  Also, will order home oxygen from [facility] as well.  [place] nurse has reported that [facility] is on the way with oxygen for the patient.  Patient was advised to go directly to the emergency department for any worsening respiratory distress whatsoever.

## 2015-10-17 ENCOUNTER — Ambulatory Visit (HOSPITAL_COMMUNITY): Payer: BLUE CROSS/BLUE SHIELD

## 2015-10-18 ENCOUNTER — Telehealth: Payer: Self-pay | Admitting: Oncology

## 2015-10-18 ENCOUNTER — Ambulatory Visit: Payer: BLUE CROSS/BLUE SHIELD | Admitting: Nurse Practitioner

## 2015-10-18 ENCOUNTER — Telehealth: Payer: Self-pay | Admitting: *Deleted

## 2015-10-18 NOTE — Telephone Encounter (Signed)
R/s 2/17 appointments to 2/13 per MD 2/10 pof

## 2015-10-18 NOTE — Telephone Encounter (Signed)
Hospice referral called in to [facility]

## 2015-10-18 NOTE — Telephone Encounter (Signed)
Pt called requesting appt with MD be moved up earlier next week; "went to [facility] and they said I need to go on Hospice; my son would like to come with me on Monday"  Per Dr. Benay Spice; notified pt that Dr. Benay Spice can see him Monday @ 3pm and we can initiate Hospice today if he wishes.  Pt request referral be sent today and verbalized understanding of appt 2/13.

## 2015-10-21 ENCOUNTER — Encounter: Payer: Self-pay | Admitting: *Deleted

## 2015-10-21 ENCOUNTER — Other Ambulatory Visit: Payer: Self-pay | Admitting: *Deleted

## 2015-10-21 ENCOUNTER — Telehealth: Payer: Self-pay | Admitting: Oncology

## 2015-10-21 ENCOUNTER — Ambulatory Visit (HOSPITAL_BASED_OUTPATIENT_CLINIC_OR_DEPARTMENT_OTHER): Payer: BLUE CROSS/BLUE SHIELD | Admitting: Oncology

## 2015-10-21 VITALS — BP 113/81 | HR 118 | Temp 97.9°F | Resp 18 | Ht 70.0 in | Wt 151.1 lb

## 2015-10-21 DIAGNOSIS — C160 Malignant neoplasm of cardia: Secondary | ICD-10-CM

## 2015-10-21 DIAGNOSIS — C155 Malignant neoplasm of lower third of esophagus: Secondary | ICD-10-CM

## 2015-10-21 DIAGNOSIS — C159 Malignant neoplasm of esophagus, unspecified: Secondary | ICD-10-CM

## 2015-10-21 DIAGNOSIS — C7972 Secondary malignant neoplasm of left adrenal gland: Secondary | ICD-10-CM | POA: Diagnosis not present

## 2015-10-21 DIAGNOSIS — J90 Pleural effusion, not elsewhere classified: Secondary | ICD-10-CM

## 2015-10-21 MED ORDER — LORAZEPAM 0.5 MG PO TABS: 0.5000 mg | ORAL_TABLET | ORAL | Status: AC | PRN

## 2015-10-21 NOTE — Telephone Encounter (Signed)
per pof to sch pt appt-gave pt copy of avs °

## 2015-10-21 NOTE — Addendum Note (Signed)
Addended by: Rosalio Macadamia C on: 10/21/2015 05:00 PM   Modules accepted: Orders, Medications

## 2015-10-21 NOTE — Addendum Note (Signed)
Encounter addended by: Ernst Spell, RN on: 10/21/2015  3:15 PM<BR>     Documentation filed: Notes Section

## 2015-10-21 NOTE — Progress Notes (Signed)
[facility] OFFICE PROGRESS NOTE   Diagnosis: Esophagus cancer  INTERVAL HISTORY:   He returns as scheduled. He reports good pain control with the current narcotic regimen. Oxygen has helped the dyspnea. His activity level has diminished. He has a poor appetite. He saw Dr. Altamease Oiler on 10/17/2015. She recommends Hospice care.  Objective:  Vital signs in last 24 hours:  Blood pressure 113/81, pulse 118, temperature 97.9 F (36.6 C), temperature source Oral, resp. rate 18, height 5' 10"  (1.778 m), weight 151 lb 1.6 oz (68.539 kg), SpO2 96 %.    HEENT: No thrush Resp: Decreased breath sounds at the lower chest bilaterally, no respiratory distress. Cardio: Regular rate and rhythm GI: Nontender, no hepatomegaly Vascular: No leg edema    Medications: I have reviewed the patient's current medications.  Assessment/Plan: 1. Adenocarcinoma the esophagus/GE junction-status post an endoscopy 05/18/2014 confirming a gastric cardia/lower esophageal mass with tumor extending proximally in the esophagus to 25 cm from the incisors  HER-2/neu amplified   Staging CT scan 05/22/2014 with indeterminate bilateral pulmonary nodules; and prominent paraesophageal, porta hepatis, and para-aortic lymph nodes   Staging PET scan 05/29/2014 confirmed hypermetabolic soft tissue thickening at the distal third of the esophagus extending into the proximal stomach. 2 additional smaller areas of hypermetabolism or noted in the more proximal esophagus with a mildly enlarged hypermetabolic gastrohepatic node.   Initiation of radiation and concurrent Taxol/carboplatin 06/11/2014; Taxol/carboplatin completed 07/09/2014; radiation completed 07/18/2014.  Esophagectomy/jejunostomy feeding tube placement 09/05/2014 with the pathology revealing [id number] tumor, HER-2/neu amplified, positive distal resection margin   Cycle 1 FOLFOX 11/01/2014  Cycle 2 FOLFOX 11/15/2014  Cycle 3 FOLFOX  11/29/2014  Cycle 4 FOLFOX 12/13/2014  Cycle 5 FOLFOX 12/27/2014  Cycle 6 FOLFOX 01/17/2015  CTs of the chest, abdomen, and pelvis on 02/01/2015-negative for recurrent esophagus cancer  CT of the chest 05/14/2015 revealed a large right pleural effusion,small left effusion, left lung nodules, mediastinal lymphadenopathy, and a left adrenal metastasis  Placement of a right Pleurx catheter 05/24/2015  Echocardiogram 05/31/2015-LVEF 60-65%  Cycle 1 Taxol/Herceptin 06/04/2015  Cycle 2 Taxol/Herceptin 06/10/2015  Cycle 3 Taxol/Herceptin 06/17/2015  Cycle 4 Taxol/Herceptin 06/24/2015  Cycle 5 Taxol/Herceptin 07/16/2015  Cycle 6 Taxol/Herceptin 07/22/2015  Cycle 7 Taxol/Herceptin 07/29/2015  Chest CT 08/29/2015 without evidence of disease progression  CTs of the chest, abdomen and pelvis 10/10/2015 with interval increase in number and size of numerous bilateral small pulmonary nodules; multiple borderline enlarged and mildly enlarged mediastinal and hilar lymph nodes again noted similar to the prior examination; possible developing lymphangitic spread of tumor; probable malignant right pleural involvement and new left adrenal masses. Probable enhancing neoplasm in the interpolar region of the left kidney.  2. History of anorexia/weight loss 3. Diabetes  4. Hypertension  5. Hyperlipidemia  6. Back pain-potentially related to chronic spine disease versus metastases. Improved pain control with MS Contin. 7. Solid/liquid dysphasia-esophagram 07/01/2015 confirmed a stricture near the pylorus, upper endoscopy 07/05/2015 with no evidence of a stricture and no malignancy seen 8. Pulmonary embolism confirmed on a CT 07/01/2015-Lovenox initiated, to begin Xarelto 07/10/2015 9. Pericardial effusion and progressive left pleural effusion on the CT 07/01/2015-status post a pericardial window and placement of a left Pleurx 07/03/2015, negative pericardium biopsy and negative pericardial/left  pleural fluid cytology 10. Right wristdrop-most likely secondary to peripheral nerve compression from sleeping in a recliner, now wearing a brace and followed in occupational therapy; improved  11. Sacral decubitus ulcers-healed      Disposition:  Mr. Langston has  a history of gastroesophageal carcinoma. There is clinical and x-ray evidence of disease progression, though he understands the metastatic disease has not been biopsy proven. His performance status is declining. I agree with the recommendation of Dr. Altamease Oiler for Hospice care. Mr. Granderson agrees to a [facility] referral. We discussed CPR and ACLS issues. He will be placed on a no blue status. He requested consolidation of his medical regimen. He will discontinue amiodarone. We discussed the risk/benefit of continuing anticoagulation therapy. He will discontinue Xarelto.  Mr. Pocock will return for an office visit in 3 weeks. We will consider adding an appetite stimulant if his appetite does not improve on the Remeron.  Betsy Coder, MD  10/21/2015  3:21 PM

## 2015-10-22 ENCOUNTER — Encounter: Payer: BLUE CROSS/BLUE SHIELD | Admitting: Occupational Therapy

## 2015-10-24 ENCOUNTER — Telehealth: Payer: Self-pay | Admitting: *Deleted

## 2015-10-24 ENCOUNTER — Encounter: Payer: BLUE CROSS/BLUE SHIELD | Admitting: Cardiothoracic Surgery

## 2015-10-24 ENCOUNTER — Other Ambulatory Visit: Payer: Self-pay | Admitting: *Deleted

## 2015-10-24 DIAGNOSIS — C155 Malignant neoplasm of lower third of esophagus: Secondary | ICD-10-CM

## 2015-10-24 MED ORDER — OXYCODONE-ACETAMINOPHEN 5-325 MG PO TABS: 1.0000 | ORAL_TABLET | ORAL | Status: AC | PRN

## 2015-10-24 NOTE — Telephone Encounter (Signed)
Pt called requesting refill of Percocet.  Please call pt when script ready for pick up. Pt's   Phone   [phone].

## 2015-10-24 NOTE — Telephone Encounter (Signed)
Notified pt that refill for Percocet has been faxed to [facility] since he is now a Hospice pt.  Pt verbalized understanding and expressed appreciation for call.

## 2015-10-25 ENCOUNTER — Ambulatory Visit: Payer: BLUE CROSS/BLUE SHIELD | Admitting: Oncology

## 2015-10-25 ENCOUNTER — Other Ambulatory Visit: Payer: Self-pay | Admitting: *Deleted

## 2015-10-25 MED ORDER — PREDNISONE 10 MG PO TABS: 10.0000 mg | ORAL_TABLET | Freq: Every day | ORAL | Status: AC

## 2015-10-25 NOTE — Telephone Encounter (Signed)
Maura, RN Hospice update "pt reports fatigue; low appetite and coughing; would Dr. Benay Spice order prednisone to help with appetite etc and something for his cough?"  Per Dr. Benay Spice; notified Cristela Blue, RN, MD ordered prednisone 10 mg daily and pt can get OTC med to help with cough.  Cristela Blue, RN verbalized understanding and expressed appreciation for call.

## 2015-10-28 ENCOUNTER — Telehealth: Payer: Self-pay | Admitting: *Deleted

## 2015-10-28 DIAGNOSIS — C155 Malignant neoplasm of lower third of esophagus: Secondary | ICD-10-CM

## 2015-10-28 MED ORDER — MORPHINE SULFATE (CONCENTRATE) 20 MG/ML PO SOLN: ORAL | Status: DC

## 2015-10-28 MED ORDER — SENNOSIDES-DOCUSATE SODIUM 8.6-50 MG PO TABS: 2.0000 | ORAL_TABLET | Freq: Every evening | ORAL | Status: DC | PRN

## 2015-10-28 NOTE — Telephone Encounter (Signed)
Maura RN with Hospice called.  "To patient's home for episode of breathing distress.  Hospice provider used Symptom Management ordering Roxanol 20mg /ml q2 hrs for respiratory distress.   We also offered Senokot 1-2 tabs daily as needed because he's straining and heart rate is elevated.  He appears to be declining quickly.  For future use of these medicines, Dr. Benay Spice will need to refill.  They've told us Medicare Guidelines/examination requires this from the attending."  If they have any questions I can be reached at [phone].

## 2015-10-28 NOTE — Telephone Encounter (Signed)
Ok, we can refill

## 2015-10-29 ENCOUNTER — Ambulatory Visit: Payer: BLUE CROSS/BLUE SHIELD | Admitting: Oncology

## 2015-10-29 ENCOUNTER — Encounter: Payer: BLUE CROSS/BLUE SHIELD | Admitting: Occupational Therapy

## 2015-10-29 MED ORDER — MORPHINE SULFATE (CONCENTRATE) 20 MG/ML PO SOLN: ORAL | Status: AC

## 2015-10-29 MED ORDER — SENNOSIDES-DOCUSATE SODIUM 8.6-50 MG PO TABS: 2.0000 | ORAL_TABLET | Freq: Every evening | ORAL | Status: AC | PRN

## 2015-10-29 NOTE — Telephone Encounter (Signed)
Senokot and Roxanol scripts faxed to patient's preferred pharmacy.

## 2015-10-29 NOTE — Addendum Note (Signed)
Addended by: San Morelle on: 10/29/2015 04:11 PM   Modules accepted: Orders

## 2015-11-05 ENCOUNTER — Encounter: Payer: BLUE CROSS/BLUE SHIELD | Admitting: Occupational Therapy

## 2015-11-06 ENCOUNTER — Telehealth: Payer: Self-pay | Admitting: *Deleted

## 2015-11-06 ENCOUNTER — Telehealth: Payer: Self-pay | Admitting: Oncology

## 2015-11-08 ENCOUNTER — Ambulatory Visit: Payer: BLUE CROSS/BLUE SHIELD | Admitting: Nurse Practitioner

## 2015-11-22 ENCOUNTER — Other Ambulatory Visit: Payer: Self-pay | Admitting: Nurse Practitioner

## 2015-12-07 NOTE — Telephone Encounter (Signed)
Message from Brandon Melnick, Hospice RN calling with an update. Pt seems to be transitioning to actively dying. He is less responsive, skin is mottling. His breathing has changed. Hospice will offer daily visits. Wife seems to be coping well, "knows what to do."

## 2015-12-07 NOTE — Telephone Encounter (Signed)
Wife called to cxl appts. Pt in hospice care

## 2015-12-07 DEATH — deceased

## 2015-12-11 ENCOUNTER — Other Ambulatory Visit: Payer: Self-pay | Admitting: Internal Medicine

## 2015-12-27 IMAGING — CR DG CHEST 2V
2 series · 2 of 2 positions shown · non-contrast
Comparison: Chest x-ray of 05/21/2015

CLINICAL DATA: Shortness of breath, pleural effusion

EXAM:
CHEST  2 VIEW

[w chest pa]
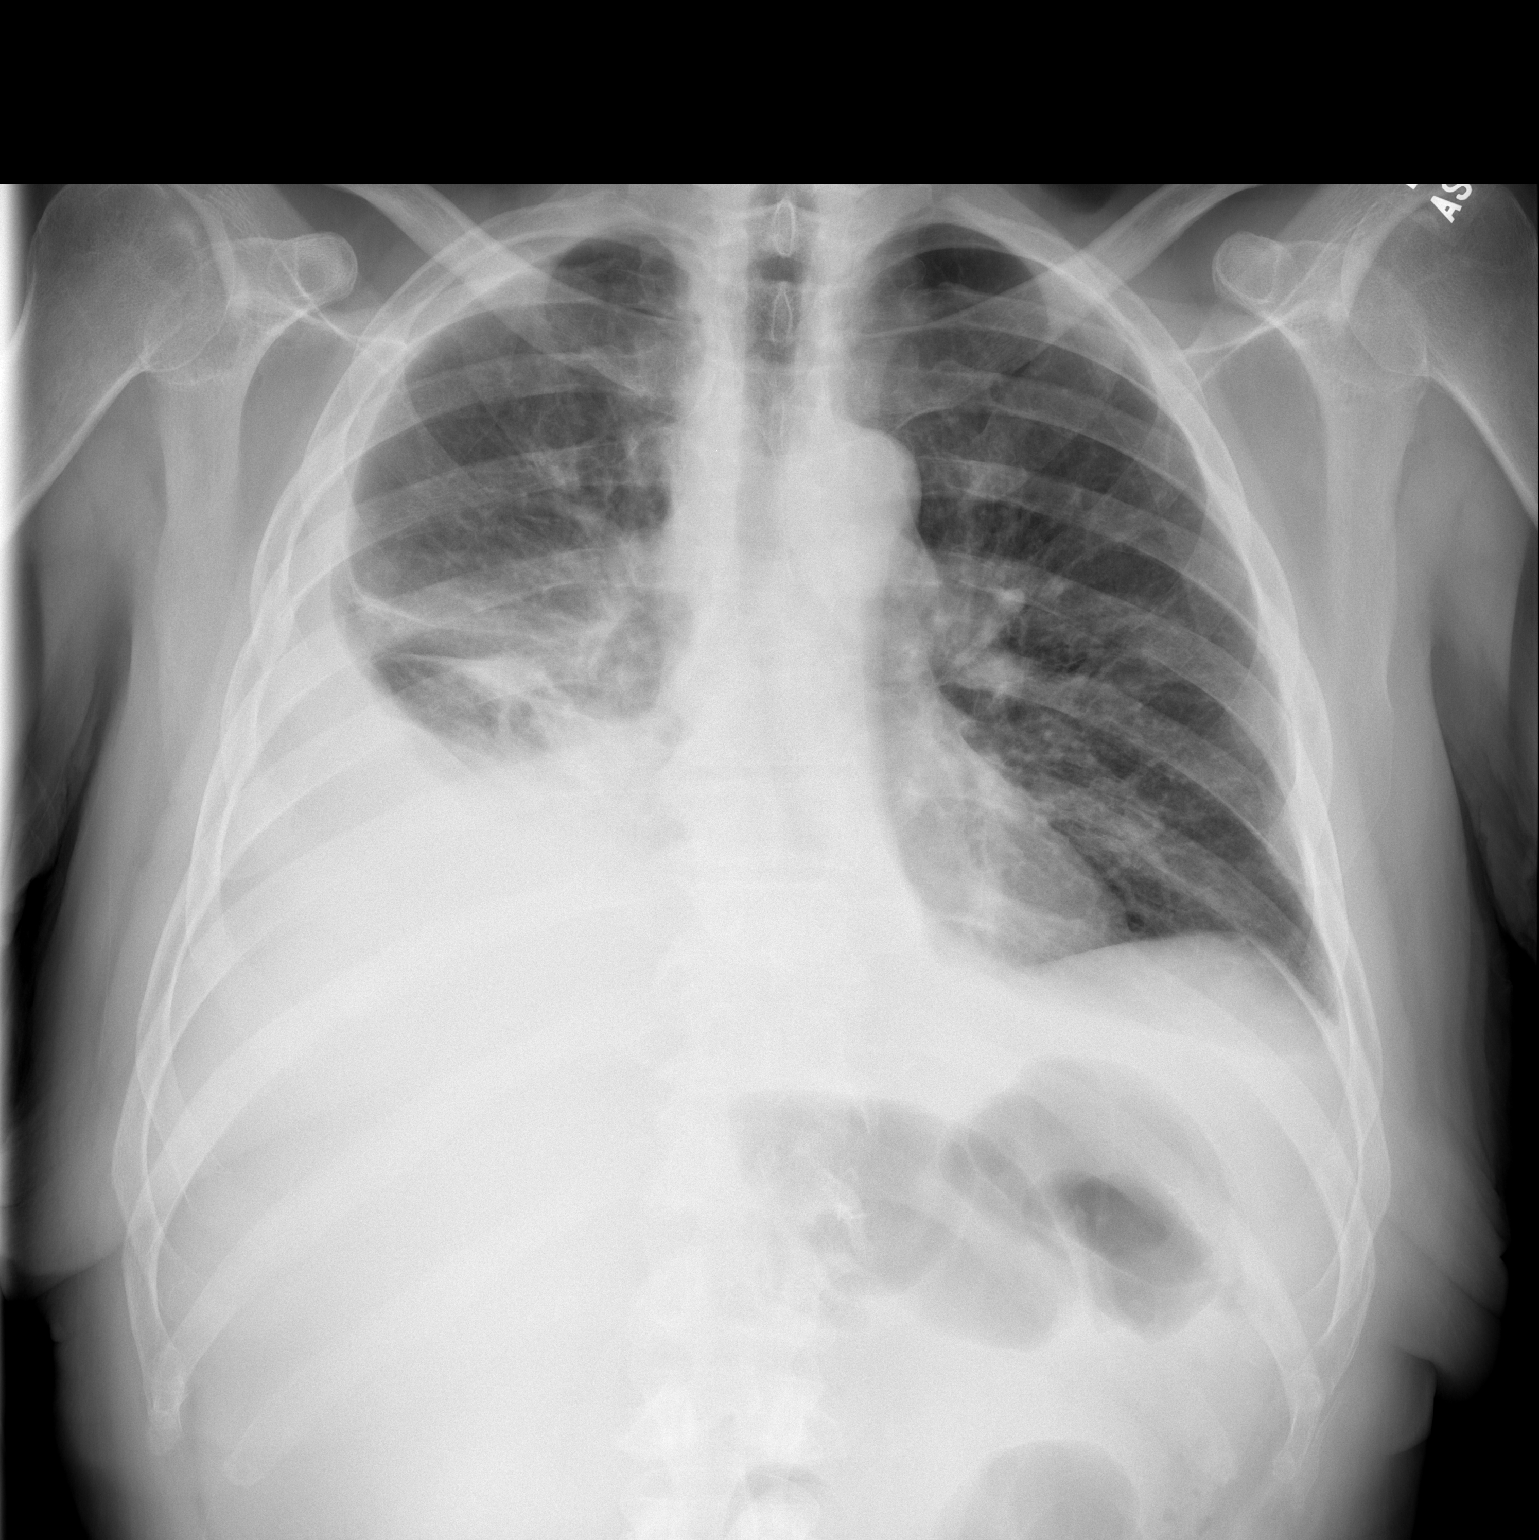

[w chest lat]
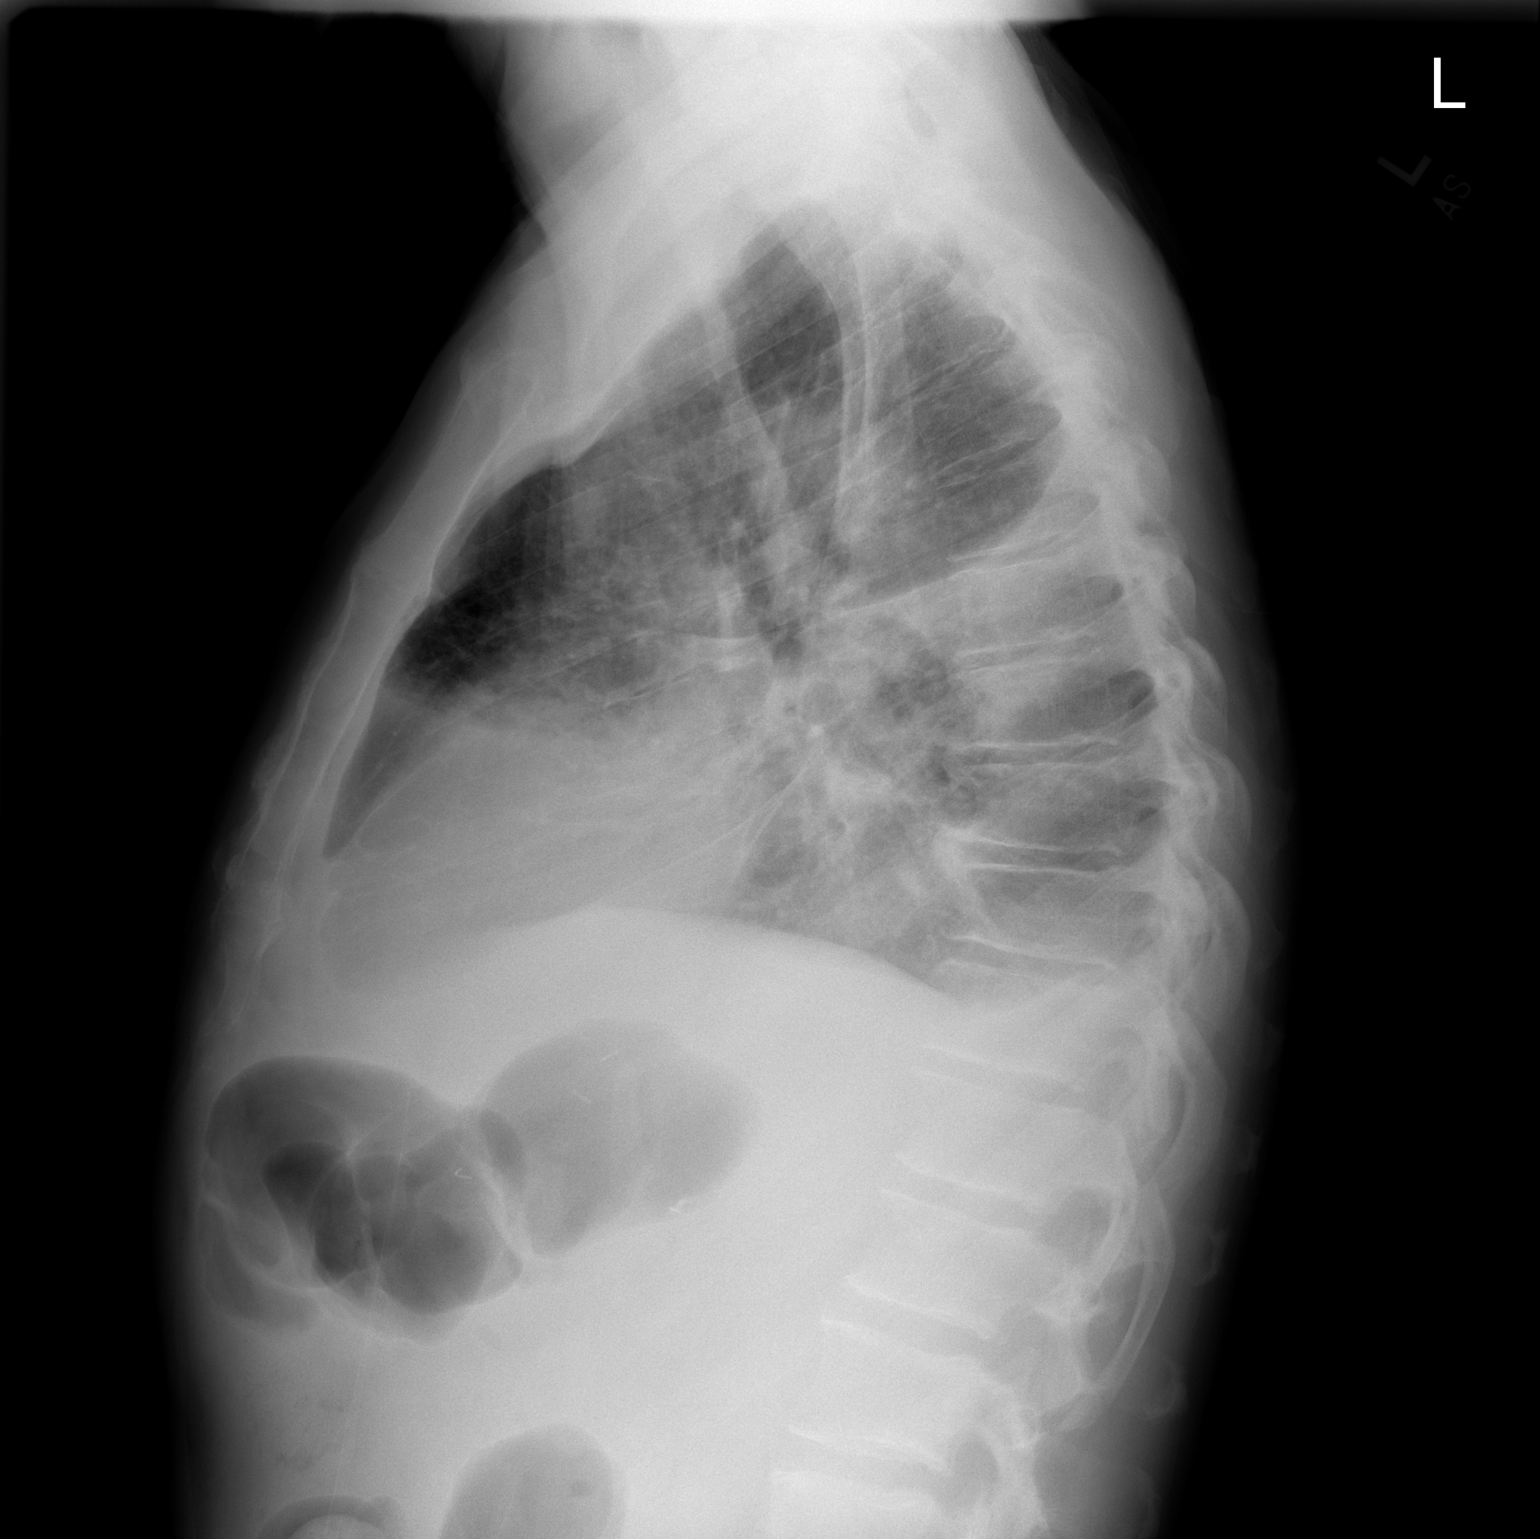

[2 of 2 positions shown; findings below may reference images not displayed]

FINDINGS: There is little change in the volume of the moderate to large right
pleural effusion with right basilar atelectasis. The does appear to
be a small left pleural effusion as well. Heart size is stable. No
bony abnormality is seen.
IMPRESSION: 1. Little change in the moderately large right pleural effusion.
2. Small left pleural effusion.

## 2016-01-03 ENCOUNTER — Ambulatory Visit: Payer: Self-pay | Admitting: Physician Assistant

## 2016-01-09 ENCOUNTER — Ambulatory Visit: Payer: BLUE CROSS/BLUE SHIELD | Admitting: Cardiology

## 2016-01-17 IMAGING — CR DG CHEST 2V
2 series · 2 of 2 positions shown · non-contrast
Comparison: Portable chest x-ray May 24, 2015

CLINICAL DATA: History of esophageal malignancy with surgery in
Sunday September, 2014, history of pleural effusions, currently
asymptomatic.

EXAM:
CHEST  2 VIEW

[w chest pa]
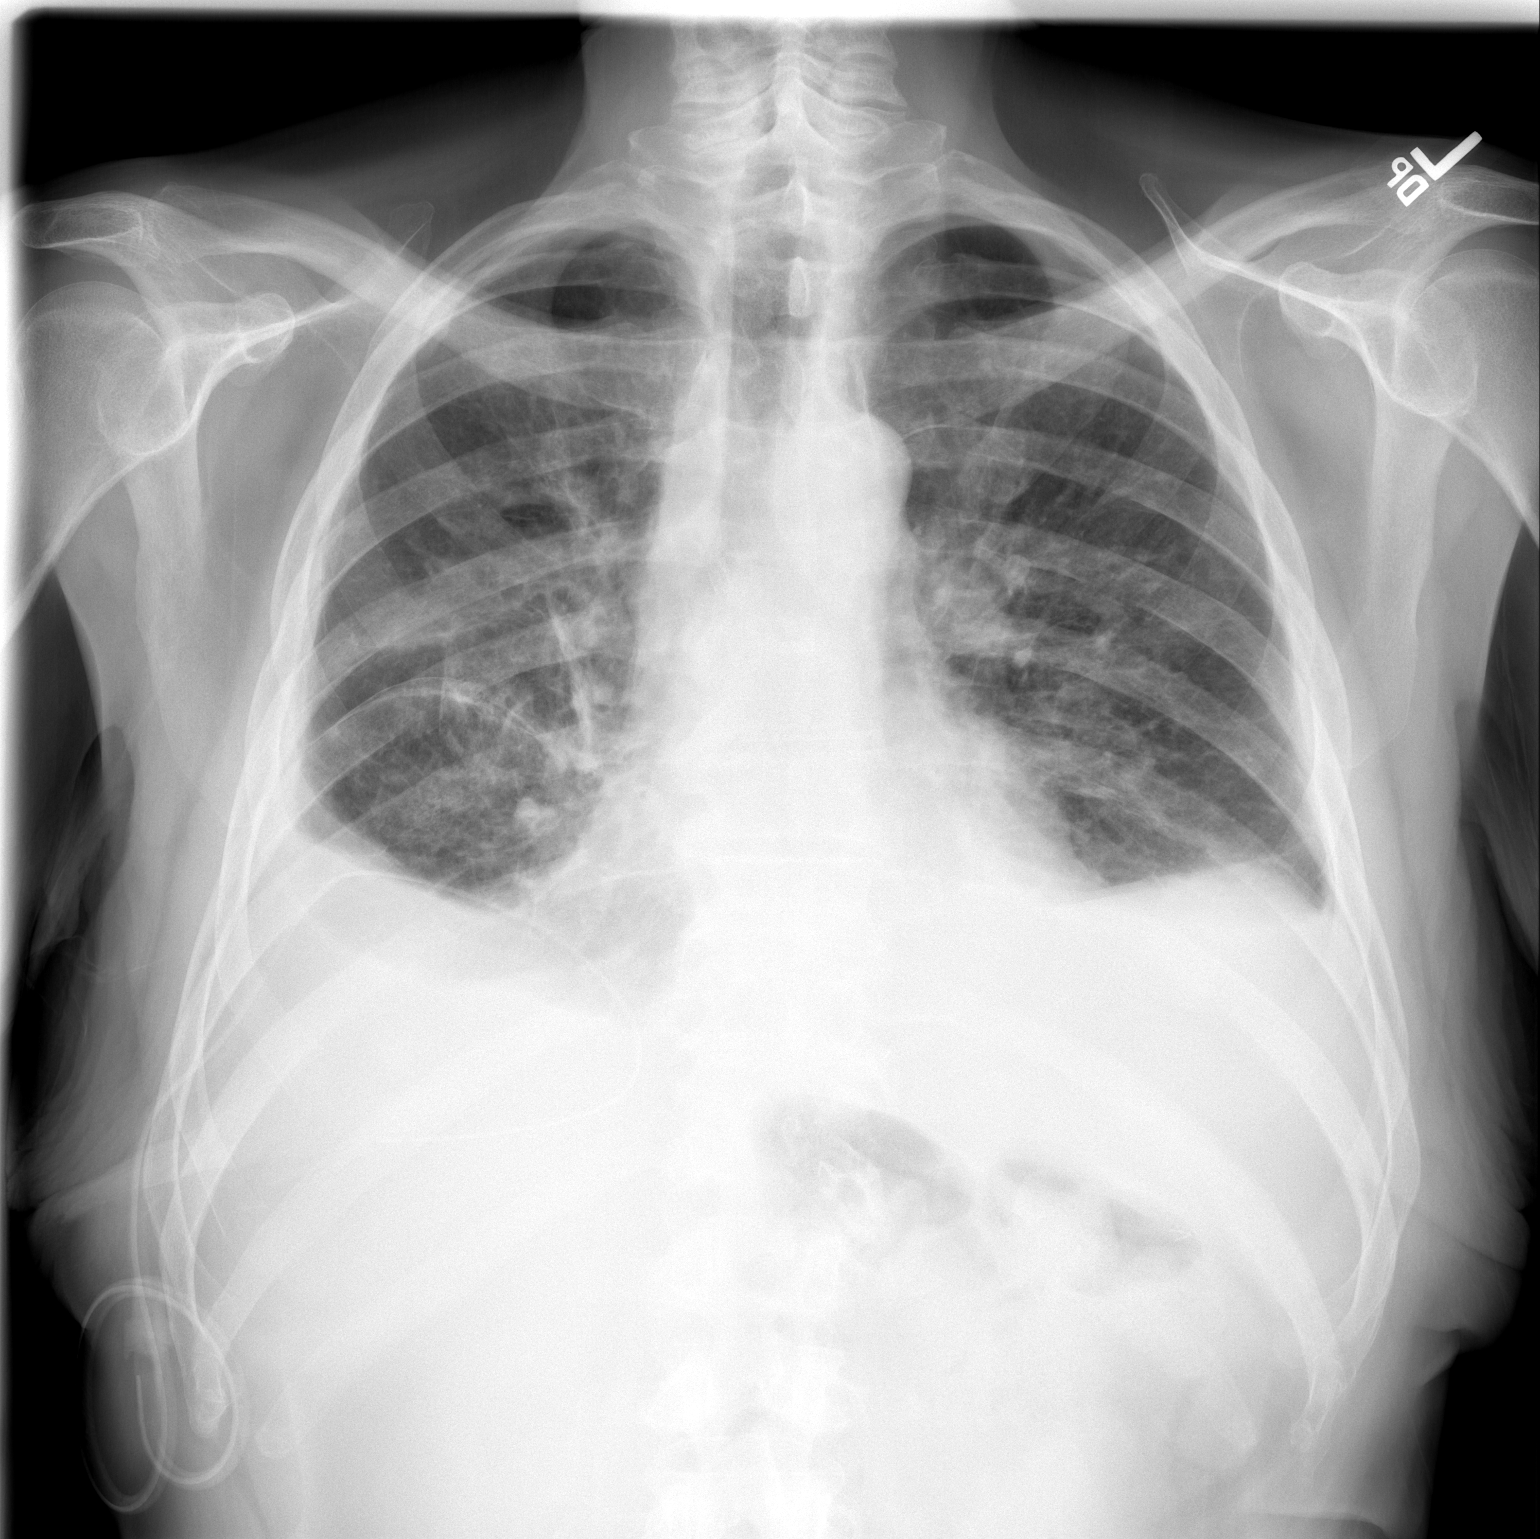

[w chest lat]
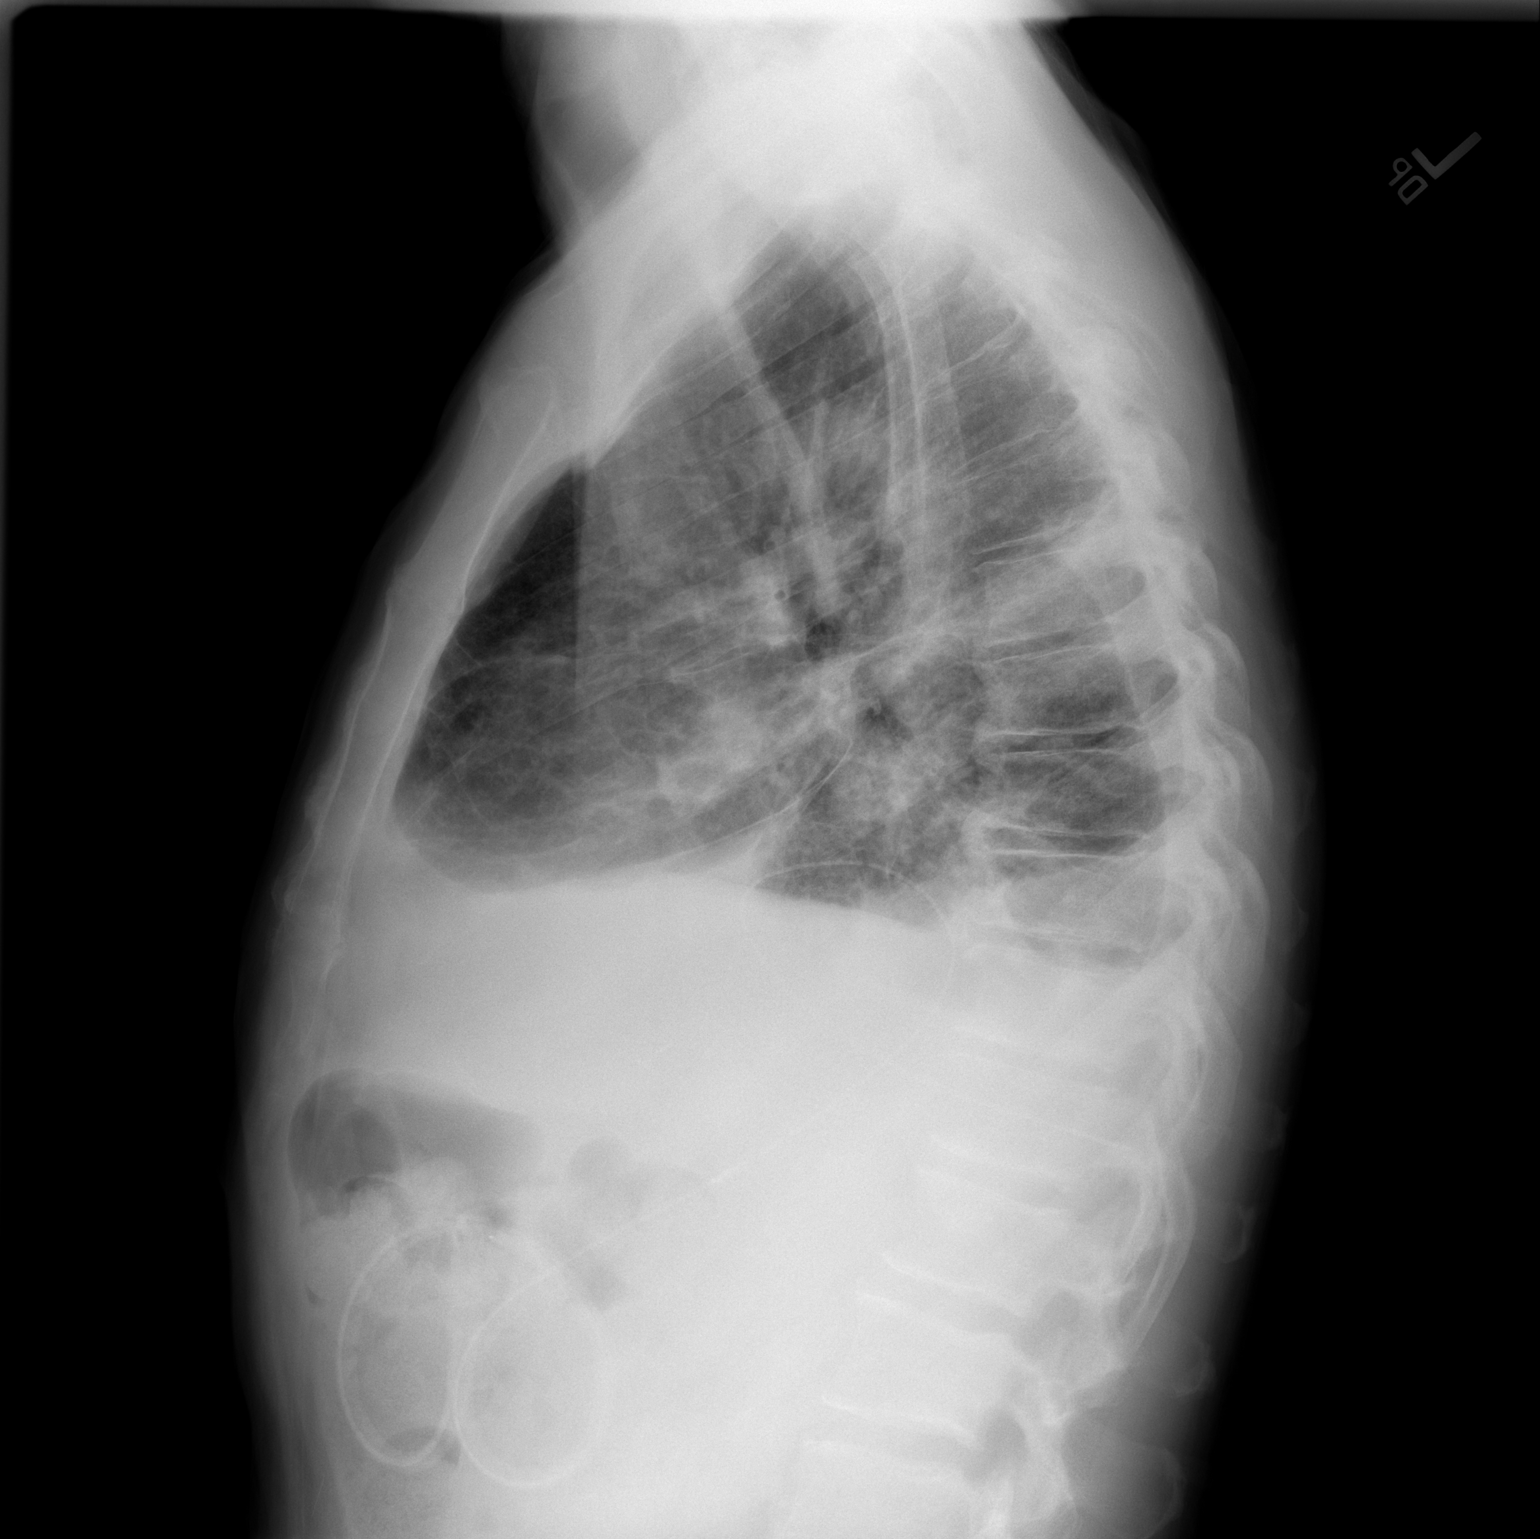

[2 of 2 positions shown; findings below may reference images not displayed]

FINDINGS: There remains a smaller moderate-sized right pleural effusion. The
small caliber right chest tube is unchanged in position at the
inferior aspect of the pleural space. A small left pleural effusion
has increased in size since [REDACTED]. There is no pneumothorax. The
pulmonary interstitial markings are slightly more conspicuous today
especially on the left. The heart is normal in size. The pulmonary
vascularity is not engorged. The bony thorax exhibits no acute
abnormality.
IMPRESSION: 1. Mildly increased pulmonary interstitial markings especially on
the left since the previous study. This may reflect interstitial
edema or early interstitial pneumonia.
2. Stable small a moderate sized right pleural effusion with stable
small caliber chest tube. Slight interval increase in the volume of
the small left pleural effusion.

## 2016-02-25 ENCOUNTER — Encounter: Payer: Self-pay | Admitting: Internal Medicine

## 2016-03-13 IMAGING — CR DG CHEST 2V
2 series · 2 of 2 positions shown · non-contrast
Comparison: July 11, 2015

CLINICAL DATA: Esophageal carcinoma

EXAM:
CHEST  2 VIEW

[w chest pa]
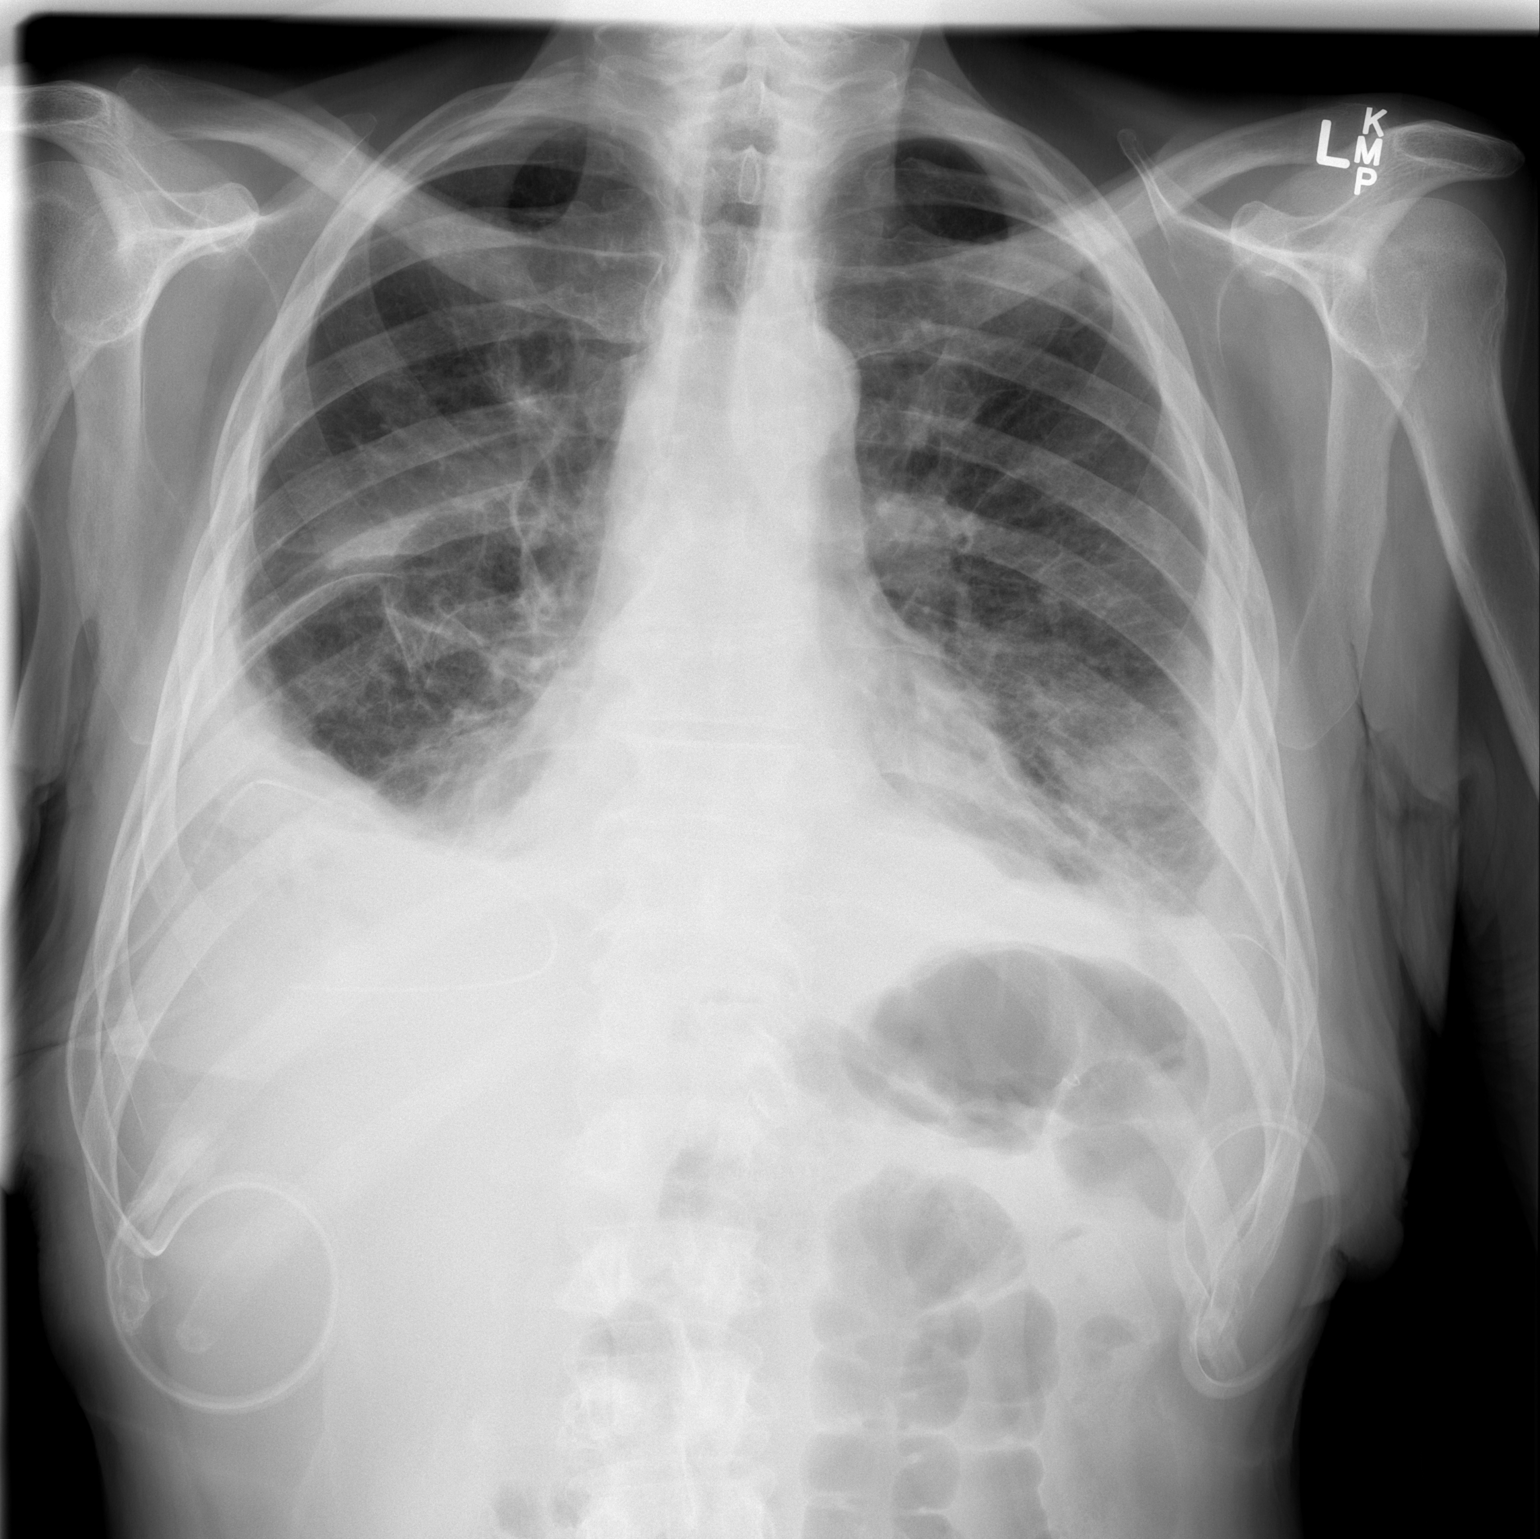

[w chest lat]
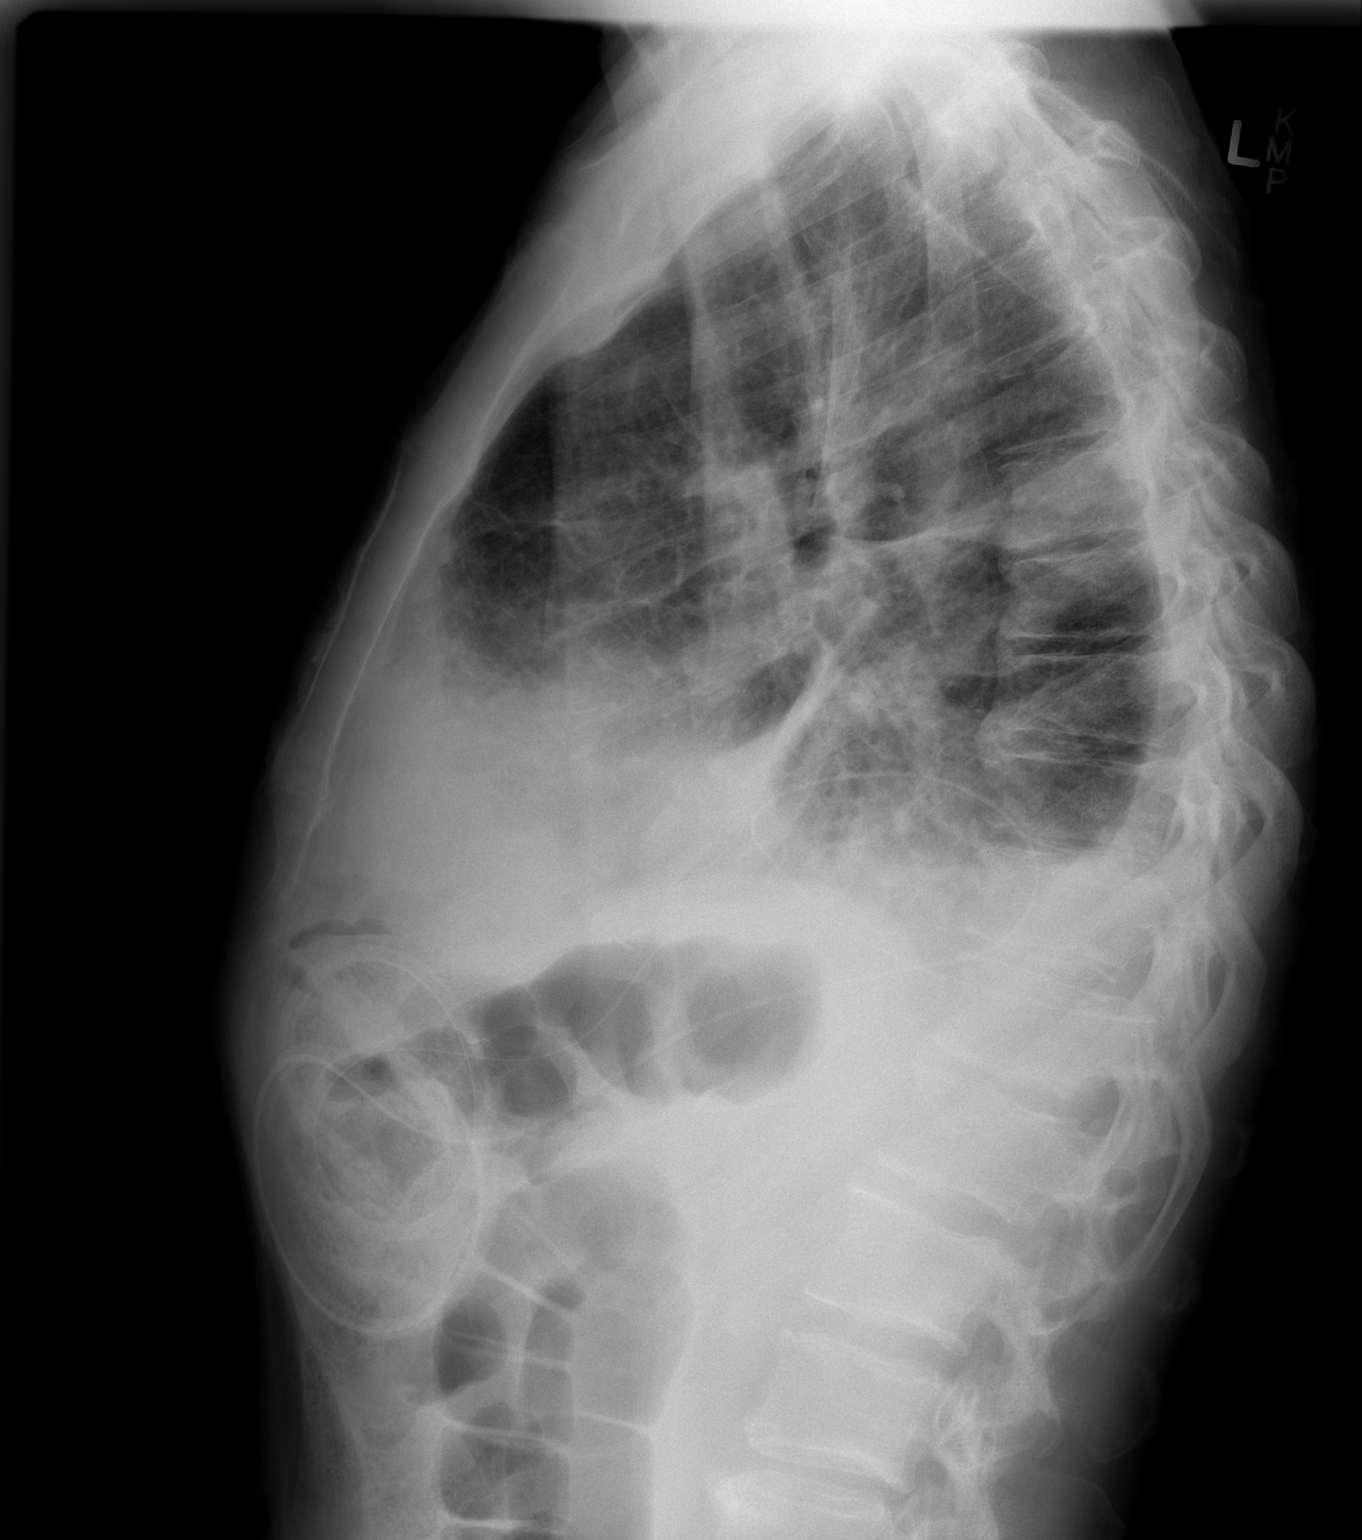

[2 of 2 positions shown; findings below may reference images not displayed]

FINDINGS: Bilateral chest drains remain in place. There are pleural effusions
bilaterally, slightly larger on the right than on the left, stable.
There is patchy atelectasis in both lower lung zones, stable there
is a small area of infiltrate in the left lower lobe, concerning for
a small focus of pneumonia.

The heart size and pulmonary vascularity are normal. There is
postoperative change in the lower mediastinal region which is
stable. No adenopathy. No bone lesions apparent.
IMPRESSION: Suspect small focus of pneumonia left lower lobe. Persistent
effusions and bibasilar atelectasis. Postoperative change in the
lower mediastinum is stable. No change in cardiac silhouette. No
pneumothorax or pneumomediastinum.

## 2016-04-06 ENCOUNTER — Encounter: Payer: Self-pay | Admitting: Internal Medicine
